# Patient Record
Sex: Female | Born: 1950 | Race: White | Hispanic: No | Marital: Married | Smoking: Current every day smoker
Health system: Southern US, Community
[De-identification: ages and names within clinical notes are randomized; demographics above are authoritative.]

## PROBLEM LIST (undated history)

## (undated) DIAGNOSIS — R0600 Dyspnea, unspecified: Secondary | ICD-10-CM

## (undated) DIAGNOSIS — E7800 Pure hypercholesterolemia, unspecified: Secondary | ICD-10-CM

## (undated) DIAGNOSIS — F419 Anxiety disorder, unspecified: Secondary | ICD-10-CM

## (undated) DIAGNOSIS — F32A Depression, unspecified: Secondary | ICD-10-CM

## (undated) DIAGNOSIS — I2510 Atherosclerotic heart disease of native coronary artery without angina pectoris: Secondary | ICD-10-CM

## (undated) DIAGNOSIS — I739 Peripheral vascular disease, unspecified: Secondary | ICD-10-CM

## (undated) DIAGNOSIS — F329 Major depressive disorder, single episode, unspecified: Secondary | ICD-10-CM

## (undated) DIAGNOSIS — I10 Essential (primary) hypertension: Secondary | ICD-10-CM

## (undated) HISTORY — PX: FOOT SURGERY: SHX648

## (undated) HISTORY — DX: Major depressive disorder, single episode, unspecified: F32.9

## (undated) HISTORY — PX: OTHER SURGICAL HISTORY: SHX169

## (undated) HISTORY — DX: Anxiety disorder, unspecified: F41.9

## (undated) HISTORY — PX: BUNIONECTOMY: SHX129

## (undated) HISTORY — DX: Depression, unspecified: F32.A

## (undated) HISTORY — PX: DENTAL SURGERY: SHX609

## (undated) HISTORY — PX: TUBAL LIGATION: SHX77

---

## 2004-09-18 ENCOUNTER — Ambulatory Visit: Payer: Self-pay | Admitting: Unknown Physician Specialty

## 2005-12-10 ENCOUNTER — Emergency Department: Payer: Self-pay | Admitting: General Practice

## 2007-05-04 ENCOUNTER — Ambulatory Visit: Payer: Self-pay | Admitting: Family Medicine

## 2007-11-16 ENCOUNTER — Ambulatory Visit: Payer: Self-pay | Admitting: Orthopaedic Surgery

## 2008-12-20 ENCOUNTER — Ambulatory Visit: Payer: Self-pay | Admitting: Family Medicine

## 2009-12-25 ENCOUNTER — Ambulatory Visit: Payer: Self-pay | Admitting: Internal Medicine

## 2010-03-06 ENCOUNTER — Ambulatory Visit: Payer: Self-pay | Admitting: Unknown Physician Specialty

## 2011-01-07 ENCOUNTER — Ambulatory Visit: Payer: Self-pay | Admitting: Family Medicine

## 2011-04-30 ENCOUNTER — Ambulatory Visit: Payer: Self-pay | Admitting: Unknown Physician Specialty

## 2011-05-02 LAB — PATHOLOGY REPORT

## 2011-12-02 ENCOUNTER — Telehealth: Payer: Self-pay | Admitting: *Deleted

## 2011-12-02 NOTE — Telephone Encounter (Signed)
Pharm faxed RF request - Prozac 40 mg 1 qd. OK for RF? (pt has apt on Friday)

## 2011-12-02 NOTE — Telephone Encounter (Signed)
Fine to fill. 

## 2011-12-03 MED ORDER — FLUOXETINE HCL 40 MG PO CAPS: 40.0000 mg | ORAL_CAPSULE | Freq: Every day | ORAL | Status: DC

## 2011-12-03 NOTE — Telephone Encounter (Signed)
Done

## 2011-12-05 ENCOUNTER — Encounter: Payer: Self-pay | Admitting: Internal Medicine

## 2011-12-05 ENCOUNTER — Ambulatory Visit: Payer: Self-pay | Admitting: Internal Medicine

## 2011-12-11 ENCOUNTER — Encounter: Payer: Self-pay | Admitting: Internal Medicine

## 2011-12-11 ENCOUNTER — Ambulatory Visit (INDEPENDENT_AMBULATORY_CARE_PROVIDER_SITE_OTHER): Payer: PRIVATE HEALTH INSURANCE | Admitting: Internal Medicine

## 2011-12-11 VITALS — BP 147/70 | HR 110 | Temp 98.4°F | Ht 64.5 in | Wt 135.0 lb

## 2011-12-11 DIAGNOSIS — N952 Postmenopausal atrophic vaginitis: Secondary | ICD-10-CM

## 2011-12-11 DIAGNOSIS — F411 Generalized anxiety disorder: Secondary | ICD-10-CM | POA: Insufficient documentation

## 2011-12-11 DIAGNOSIS — F341 Dysthymic disorder: Secondary | ICD-10-CM | POA: Insufficient documentation

## 2011-12-11 DIAGNOSIS — F418 Other specified anxiety disorders: Secondary | ICD-10-CM | POA: Insufficient documentation

## 2011-12-11 MED ORDER — ESTROGENS, CONJUGATED 0.625 MG/GM VA CREA: TOPICAL_CREAM | Freq: Every day | VAGINAL | Status: DC

## 2011-12-11 MED ORDER — ESTRADIOL 0.1 MG/GM VA CREA: 2.0000 g | TOPICAL_CREAM | Freq: Every day | VAGINAL | Status: DC

## 2011-12-11 MED ORDER — FLUOXETINE HCL 40 MG PO CAPS: 40.0000 mg | ORAL_CAPSULE | Freq: Every day | ORAL | Status: DC

## 2011-12-11 NOTE — Progress Notes (Signed)
Subjective:    [name], female    DOB: September 29, 1951, 61 y.o.   MRN: [id number]  HPI 61YO female with h/o depression and anxiety presents for follow up.  In regards to anxiety/depression, she reports symptoms fairly well controlled on Prozac.  She notes that she is "tired" of her job and of being the breadwinner in her family, however needs to work for another 5 years prior to retirement.  She is concerned today about some pain with intercourse. She notes that she only rarely has sexual relations with her husband. When they last had sex over in 09/2011, she had pain with intercourse.  She notes some vaginal dryness. She has tried using topical lubricants with no improvement in her symptoms.  She denies any vaginal bleeding or discharge. She denies pelvic pain.  Outpatient Encounter Prescriptions as of 12/11/2011  Medication Sig Dispense Refill  . cholecalciferol (VITAMIN D) 1000 UNITS tablet Take 1,000 Units by mouth daily.      Marland Kitchen FLUoxetine (PROZAC) 40 MG capsule Take 1 capsule (40 mg total) by mouth daily.  90 capsule  3  . Multiple Vitamins-Minerals (MULTIVITAMIN WITH MINERALS) tablet Take 1 tablet by mouth daily.        Review of Systems  Constitutional: Negative for fever, chills, appetite change, fatigue and unexpected weight change.  HENT: Negative for ear pain, congestion, sore throat, trouble swallowing, neck pain, voice change and sinus pressure.   Eyes: Negative for visual disturbance.  Respiratory: Negative for cough, shortness of breath, wheezing and stridor.   Cardiovascular: Negative for chest pain, palpitations and leg swelling.  Gastrointestinal: Negative for nausea, vomiting, abdominal pain, diarrhea, constipation, blood in stool, abdominal distention and anal bleeding.  Genitourinary: Positive for vaginal pain and dyspareunia. Negative for dysuria, flank pain, vaginal bleeding and vaginal discharge.  Musculoskeletal: Negative for myalgias, arthralgias and gait  problem.  Skin: Negative for color change and rash.  Neurological: Negative for dizziness and headaches.  Hematological: Negative for adenopathy. Does not bruise/bleed easily.  Psychiatric/Behavioral: Negative for suicidal ideas, sleep disturbance and dysphoric mood. The patient is nervous/anxious.    BP 147/70  Pulse 110  Temp(Src) 98.4 F (36.9 C) (Oral)  Ht 5' 4.5" (1.638 m)  Wt 135 lb (61.236 kg)  BMI 22.81 kg/m2  SpO2 99%     Objective:   Physical Exam  Constitutional: She is oriented to person, place, and time. She appears well-developed and well-nourished. No distress.  HENT:  Head: Normocephalic and atraumatic.  Right Ear: External ear normal.  Left Ear: External ear normal.  Nose: Nose normal.  Mouth/Throat: Oropharynx is clear and moist. No oropharyngeal exudate.  Eyes: Conjunctivae are normal. Pupils are equal, round, and reactive to light. Right eye exhibits no discharge. Left eye exhibits no discharge. No scleral icterus.  Neck: Normal range of motion. Neck supple. No tracheal deviation present. No thyromegaly present.  Cardiovascular: Normal rate, regular rhythm, normal heart sounds and intact distal pulses.  Exam reveals no gallop and no friction rub.   No murmur heard. Pulmonary/Chest: Effort normal and breath sounds normal. No respiratory distress. She has no wheezes. She has no rales. She exhibits no tenderness.  Musculoskeletal: Normal range of motion. She exhibits no edema and no tenderness.  Lymphadenopathy:    She has no cervical adenopathy.  Neurological: She is alert and oriented to person, place, and time. No cranial nerve deficit. She exhibits normal muscle tone. Coordination normal.  Skin: Skin is warm and dry. No rash noted. She  is not diaphoretic. No erythema. No pallor.  Psychiatric: Her speech is normal and behavior is normal. Judgment and thought content normal. Her mood appears anxious. Cognition and memory are normal.          Assessment &  Plan:

## 2011-12-11 NOTE — Assessment & Plan Note (Signed)
Will start topical estrace. Discussed using medication nightly x 2 weeks, then 1-2 times per week.  Discussed risks including potential increased risk of breast and uterine cancer.  Follow up 1 month.

## 2011-12-11 NOTE — Assessment & Plan Note (Signed)
Symptoms well controlled with Prozac. Will continue. Refills given today.

## 2011-12-19 ENCOUNTER — Encounter: Payer: Self-pay | Admitting: Internal Medicine

## 2012-01-01 ENCOUNTER — Telehealth: Payer: Self-pay | Admitting: Internal Medicine

## 2012-01-01 MED ORDER — ERGOCALCIFEROL 1.25 MG (50000 UT) PO CAPS: 50000.0000 [IU] | ORAL_CAPSULE | ORAL | Status: DC

## 2012-01-01 NOTE — Telephone Encounter (Signed)
Labs show low vit D of 19.9.  Recommend starting Vit D 50000IU weekly x12 weeks then repeat level. Cholesterol was also slightly high. Recommend diet low in saturated fat and high in fiber.

## 2012-01-01 NOTE — Telephone Encounter (Signed)
Patient informed, Rx sent in 

## 2012-01-06 ENCOUNTER — Encounter: Payer: Self-pay | Admitting: Internal Medicine

## 2012-01-08 ENCOUNTER — Ambulatory Visit: Payer: PRIVATE HEALTH INSURANCE | Admitting: Internal Medicine

## 2012-01-19 ENCOUNTER — Ambulatory Visit (INDEPENDENT_AMBULATORY_CARE_PROVIDER_SITE_OTHER): Payer: PRIVATE HEALTH INSURANCE | Admitting: Internal Medicine

## 2012-01-19 ENCOUNTER — Encounter: Payer: Self-pay | Admitting: Internal Medicine

## 2012-01-19 VITALS — BP 166/92 | HR 82 | Temp 98.6°F | Ht 64.5 in | Wt 136.2 lb

## 2012-01-19 DIAGNOSIS — R030 Elevated blood-pressure reading, without diagnosis of hypertension: Secondary | ICD-10-CM | POA: Insufficient documentation

## 2012-01-19 DIAGNOSIS — F418 Other specified anxiety disorders: Secondary | ICD-10-CM

## 2012-01-19 DIAGNOSIS — I10 Essential (primary) hypertension: Secondary | ICD-10-CM | POA: Insufficient documentation

## 2012-01-19 DIAGNOSIS — F341 Dysthymic disorder: Secondary | ICD-10-CM

## 2012-01-19 DIAGNOSIS — N952 Postmenopausal atrophic vaginitis: Secondary | ICD-10-CM

## 2012-01-19 MED ORDER — FLUOXETINE HCL 40 MG PO CAPS: 40.0000 mg | ORAL_CAPSULE | Freq: Every day | ORAL | Status: DC

## 2012-01-19 NOTE — Assessment & Plan Note (Signed)
Patient has decided not to use topical estrogen preparation. We'll continue to monitor.

## 2012-01-19 NOTE — Patient Instructions (Signed)
Please check blood pressure 2-3 times per week at home. Call with results.

## 2012-01-19 NOTE — Progress Notes (Signed)
Subjective:    [name], female    DOB: Aug 06, 1951, 61 y.o.   MRN: [id number]  HPI 61 year old female with history of anxiety and recent episode of atrophic vaginitis presents for followup. She reports that she ultimately decided not to use Estrace cream after long discussion with her husband in which they decided not to have intercourse. She feels that this will not be necessary at this time. She reports that she is comfortable with this decision.  In regards to her anxiety, she reports that symptoms are fairly well controlled with the use of Prozac.   Outpatient Encounter Prescriptions as of 01/19/2012  Medication Sig Dispense Refill  . ergocalciferol (DRISDOL) 50000 UNITS capsule Take 1 capsule (50,000 Units total) by mouth once a week.  12 capsule  0  . FLUoxetine (PROZAC) 40 MG capsule Take 1 capsule (40 mg total) by mouth daily.  90 capsule  4  . Multiple Vitamins-Minerals (MULTIVITAMIN WITH MINERALS) tablet Take 1 tablet by mouth daily.      Marland Kitchen DISCONTD: FLUoxetine (PROZAC) 40 MG capsule Take 1 capsule (40 mg total) by mouth daily.  90 capsule  3  . cholecalciferol (VITAMIN D) 1000 UNITS tablet Take 1,000 Units by mouth daily.      Marland Kitchen estradiol (ESTRACE VAGINAL) 0.1 MG/GM vaginal cream Place 0.25 Applicatorfuls vaginally daily.  42.5 g  12   BP 166/92  Pulse 82  Temp(Src) 98.6 F (37 C) (Oral)  Ht 5' 4.5" (1.638 m)  Wt 136 lb 4 oz (61.803 kg)  BMI 23.03 kg/m2  SpO2 95%  Review of Systems  Constitutional: Negative for fever, chills, appetite change, fatigue and unexpected weight change.  HENT: Negative for ear pain, congestion, sore throat, trouble swallowing, neck pain, voice change and sinus pressure.   Eyes: Negative for visual disturbance.  Respiratory: Negative for cough, shortness of breath, wheezing and stridor.   Cardiovascular: Negative for chest pain, palpitations and leg swelling.  Gastrointestinal: Negative for nausea, vomiting, abdominal pain, diarrhea,  constipation, blood in stool, abdominal distention and anal bleeding.  Genitourinary: Negative for dysuria and flank pain.  Musculoskeletal: Negative for myalgias, arthralgias and gait problem.  Skin: Negative for color change and rash.  Neurological: Negative for dizziness and headaches.  Hematological: Negative for adenopathy. Does not bruise/bleed easily.  Psychiatric/Behavioral: Negative for suicidal ideas, sleep disturbance and dysphoric mood. The patient is nervous/anxious.        Objective:   Physical Exam  Constitutional: She is oriented to person, place, and time. She appears well-developed and well-nourished. No distress.  HENT:  Head: Normocephalic and atraumatic.  Right Ear: External ear normal.  Left Ear: External ear normal.  Nose: Nose normal.  Mouth/Throat: Oropharynx is clear and moist. No oropharyngeal exudate.  Eyes: Conjunctivae are normal. Pupils are equal, round, and reactive to light. Right eye exhibits no discharge. Left eye exhibits no discharge. No scleral icterus.  Neck: Normal range of motion. Neck supple. No tracheal deviation present. No thyromegaly present.  Cardiovascular: Normal rate, regular rhythm, normal heart sounds and intact distal pulses.  Exam reveals no gallop and no friction rub.   No murmur heard. Pulmonary/Chest: Effort normal and breath sounds normal. No respiratory distress. She has no wheezes. She has no rales. She exhibits no tenderness.  Musculoskeletal: Normal range of motion. She exhibits no edema and no tenderness.  Lymphadenopathy:    She has no cervical adenopathy.  Neurological: She is alert and oriented to person, place, and time. No cranial nerve deficit.  She exhibits normal muscle tone. Coordination normal.  Skin: Skin is warm and dry. No rash noted. She is not diaphoretic. No erythema. No pallor.  Psychiatric: Her behavior is normal. Judgment and thought content normal. Her mood appears anxious. Her speech is rapid and/or  pressured.          Assessment & Plan:

## 2012-01-19 NOTE — Assessment & Plan Note (Addendum)
Likely secondary to anxiety. Patient will monitor blood pressure at home and will call with readings. BP has been transiently elevated in the past, but improved with reduced anxiety. Follow up prn.

## 2012-01-19 NOTE — Assessment & Plan Note (Signed)
Symptoms currently well-controlled with Prozac. We'll continue to monitor. Followup in 6 months.

## 2012-01-22 ENCOUNTER — Ambulatory Visit: Payer: Self-pay | Admitting: Family Medicine

## 2012-04-07 ENCOUNTER — Telehealth: Payer: Self-pay | Admitting: Internal Medicine

## 2012-04-07 NOTE — Telephone Encounter (Signed)
Patient advised as instructed via telephone. 

## 2012-04-07 NOTE — Telephone Encounter (Signed)
Labs were normal. Outside labs.

## 2012-07-22 ENCOUNTER — Encounter: Payer: Self-pay | Admitting: Internal Medicine

## 2012-07-22 ENCOUNTER — Ambulatory Visit (INDEPENDENT_AMBULATORY_CARE_PROVIDER_SITE_OTHER): Payer: PRIVATE HEALTH INSURANCE | Admitting: Internal Medicine

## 2012-07-22 VITALS — BP 144/92 | HR 85 | Temp 98.7°F | Ht 64.5 in | Wt 139.8 lb

## 2012-07-22 DIAGNOSIS — M25511 Pain in right shoulder: Secondary | ICD-10-CM | POA: Insufficient documentation

## 2012-07-22 DIAGNOSIS — H6691 Otitis media, unspecified, right ear: Secondary | ICD-10-CM

## 2012-07-22 DIAGNOSIS — F341 Dysthymic disorder: Secondary | ICD-10-CM

## 2012-07-22 DIAGNOSIS — M25519 Pain in unspecified shoulder: Secondary | ICD-10-CM

## 2012-07-22 DIAGNOSIS — R030 Elevated blood-pressure reading, without diagnosis of hypertension: Secondary | ICD-10-CM

## 2012-07-22 DIAGNOSIS — F418 Other specified anxiety disorders: Secondary | ICD-10-CM

## 2012-07-22 DIAGNOSIS — H6690 Otitis media, unspecified, unspecified ear: Secondary | ICD-10-CM

## 2012-07-22 MED ORDER — AMOXICILLIN-POT CLAVULANATE 875-125 MG PO TABS: 1.0000 | ORAL_TABLET | Freq: Two times a day (BID) | ORAL | Status: DC

## 2012-07-22 NOTE — Assessment & Plan Note (Signed)
Symptoms are controlled but persistently diffuse of Prozac. Patient has been contemplating referral for counseling. She will e-mail she is interested in proceeding with this.

## 2012-07-22 NOTE — Assessment & Plan Note (Signed)
Exam is most consistent with right otitis media. Will treat with Augmentin. Patient will call if symptoms are not improving.

## 2012-07-22 NOTE — Assessment & Plan Note (Signed)
Right shoulder pain and weakness with abduction is most consistent with rotator cuff tear. Recommended proceeding with imaging with MRI. Patient does not want to proceed with imaging at this time. She declines referral to orthopedics. Will continue to monitor.

## 2012-07-22 NOTE — Assessment & Plan Note (Signed)
Blood pressure slightly elevated today however patient reports better controlled at home. We'll continue to monitor. She will call if blood pressure consistently greater than 140/90.

## 2012-07-22 NOTE — Progress Notes (Signed)
Subjective:    [name], female    DOB: May 24, 1951, 61 y.o.   MRN: [id number]  HPI 61 year old female with history of anxiety/depression presents for followup. In regards to her anxiety and depression, she reports that symptoms have been well-controlled Prozac however she continues to have some exacerbation when dealing with ongoing family issues. She has been considering referral to psychologist for counseling, as we discussed at her last visit. She has not yet made a final decision on this.  She is concerned today about right ear pain. She notes that a couple of weeks ago she had some discomfort in her right ear and was cleaning her ear with a Q-tip. Since that time, the pain has been worse. She denies any nasal congestion, fever, chills. 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 Next  She is also concerned about several week history of right shoulder pain. She denies any trauma to her shoulder. She describes pain as aching which is made worse with movement. She also has some weakness in abduction of her right arm. She has not been taking any medication for pain.  Outpatient Encounter Prescriptions as of 07/22/2012  Medication Sig Dispense Refill  . cholecalciferol (VITAMIN D) 1000 UNITS tablet Take 1,000 Units by mouth daily.      Marland Kitchen FLUoxetine (PROZAC) 40 MG capsule Take 1 capsule (40 mg total) by mouth daily.  90 capsule  4  . Multiple Vitamins-Minerals (MULTIVITAMIN WITH MINERALS) tablet Take 1 tablet by mouth daily.      Marland Kitchen DISCONTD: ergocalciferol (DRISDOL) 50000 UNITS capsule Take 1 capsule (50,000 Units total) by mouth once a week.  12 capsule  0  . DISCONTD: estradiol (ESTRACE VAGINAL) 0.1 MG/GM vaginal cream Place 0.25 Applicatorfuls vaginally daily.  42.5 g  12  . amoxicillin-clavulanate (AUGMENTIN) 875-125 MG per tablet Take 1 tablet by mouth 2 (two) times daily.  20 tablet  0   BP 144/92  Pulse 85  Temp 98.7 F (37.1 C) (Oral)  Ht 5' 4.5" (1.638  m)  Wt 139 lb 12 oz (63.39 kg)  BMI 23.62 kg/m2  SpO2 99%  Review of Systems  Constitutional: Negative for fever, chills, appetite change, fatigue and unexpected weight change.  HENT: Positive for ear pain. Negative for congestion, sore throat, trouble swallowing, neck pain, voice change and sinus pressure.   Eyes: Negative for visual disturbance.  Respiratory: Negative for cough, shortness of breath, wheezing and stridor.   Cardiovascular: Negative for chest pain, palpitations and leg swelling.  Gastrointestinal: Negative for nausea, vomiting, abdominal pain, diarrhea, constipation, blood in stool, abdominal distention and anal bleeding.  Genitourinary: Negative for dysuria and flank pain.  Musculoskeletal: Positive for myalgias and arthralgias. Negative for gait problem.  Skin: Negative for color change and rash.  Neurological: Negative for dizziness and headaches.  Hematological: Negative for adenopathy. Does not bruise/bleed easily.  Psychiatric/Behavioral: Positive for dysphoric mood. Negative for suicidal ideas and disturbed wake/sleep cycle. The patient is nervous/anxious.        Objective:   Physical Exam  Constitutional: She is oriented to person, place, and time. She appears well-developed and well-nourished. No distress.  HENT:  Head: Normocephalic and atraumatic.  Right Ear: External ear normal.  Left Ear: External ear normal.  Nose: Nose normal.  Mouth/Throat: Oropharynx is clear and moist. No oropharyngeal exudate.  Eyes: Conjunctivae normal are normal. Pupils are equal, round, and reactive to light. Right eye exhibits no discharge. Left eye exhibits no discharge. No scleral  icterus.  Neck: Normal range of motion. Neck supple. No tracheal deviation present. No thyromegaly present.  Cardiovascular: Normal rate, regular rhythm, normal heart sounds and intact distal pulses.  Exam reveals no gallop and no friction rub.   No murmur heard. Pulmonary/Chest: Effort normal and  breath sounds normal. No respiratory distress. She has no wheezes. She has no rales. She exhibits no tenderness.  Musculoskeletal: She exhibits no edema and no tenderness.       Right shoulder: She exhibits decreased range of motion, tenderness, pain and decreased strength (decreased strength with abduction). She exhibits no bony tenderness and no crepitus.  Lymphadenopathy:    She has no cervical adenopathy.  Neurological: She is alert and oriented to person, place, and time. No cranial nerve deficit. She exhibits normal muscle tone. Coordination normal.  Skin: Skin is warm and dry. No rash noted. She is not diaphoretic. No erythema. No pallor.  Psychiatric: Her behavior is normal. Judgment and thought content normal. Her mood appears anxious.          Assessment & Plan:

## 2013-01-26 ENCOUNTER — Ambulatory Visit: Payer: Self-pay | Admitting: Family Medicine

## 2013-01-26 LAB — HM MAMMOGRAPHY: HM Mammogram: NORMAL

## 2013-02-02 ENCOUNTER — Ambulatory Visit (INDEPENDENT_AMBULATORY_CARE_PROVIDER_SITE_OTHER): Payer: BC Managed Care – PPO | Admitting: Internal Medicine

## 2013-02-02 ENCOUNTER — Encounter: Payer: Self-pay | Admitting: Internal Medicine

## 2013-02-02 VITALS — BP 160/86 | HR 74 | Temp 98.5°F | Wt 141.0 lb

## 2013-02-02 DIAGNOSIS — M67439 Ganglion, unspecified wrist: Secondary | ICD-10-CM | POA: Insufficient documentation

## 2013-02-02 DIAGNOSIS — M67431 Ganglion, right wrist: Secondary | ICD-10-CM

## 2013-02-02 DIAGNOSIS — L309 Dermatitis, unspecified: Secondary | ICD-10-CM | POA: Insufficient documentation

## 2013-02-02 DIAGNOSIS — F341 Dysthymic disorder: Secondary | ICD-10-CM

## 2013-02-02 DIAGNOSIS — R030 Elevated blood-pressure reading, without diagnosis of hypertension: Secondary | ICD-10-CM

## 2013-02-02 DIAGNOSIS — M6740 Ganglion, unspecified site: Secondary | ICD-10-CM

## 2013-02-02 DIAGNOSIS — F418 Other specified anxiety disorders: Secondary | ICD-10-CM

## 2013-02-02 DIAGNOSIS — IMO0001 Reserved for inherently not codable concepts without codable children: Secondary | ICD-10-CM

## 2013-02-02 DIAGNOSIS — L259 Unspecified contact dermatitis, unspecified cause: Secondary | ICD-10-CM

## 2013-02-02 NOTE — Assessment & Plan Note (Signed)
BP Readings from Last 3 Encounters:  02/02/13 160/86  07/22/12 144/92  01/19/12 166/92   BP elevated today, consistent with known h/o white coat hypertension, however well controlled at home. Will continue to monitor. Pt will call if consistently >140/90 at home.

## 2013-02-02 NOTE — Progress Notes (Signed)
Subjective:    [name], female    DOB: 09/24/1951, 62 y.o.   MRN: [id number]  HPI 62YO female with anxiety/depression presents for follow up. Generally doing well. Concerned today about several month h/o cyst over right medial wrist. Area is not painful. Has increased in size over last few months. No trauma to wrist. No overlying skin changes.  Also concerned with recent dry, itchy patches of skin over bilateral lower extremities. Symptoms come and go without obvious trigger. Not applying any specific lotions or creams.   Symptoms of anxiety generally well controlled with fluoxetine, however still continues to have occasional panic attacks which wake her from sleep at night. This has been chronic and ongoing for years.  Outpatient Encounter Prescriptions as of 02/02/2013  Medication Sig Dispense Refill  . cholecalciferol (VITAMIN D) 1000 UNITS tablet Take 1,000 Units by mouth daily.      Marland Kitchen FLUoxetine (PROZAC) 40 MG capsule Take 1 capsule (40 mg total) by mouth daily.  90 capsule  4  . Multiple Vitamins-Minerals (MULTIVITAMIN WITH MINERALS) tablet Take 1 tablet by mouth daily.       No facility-administered encounter medications on file as of 02/02/2013.   BP 160/86  Pulse 74  Temp(Src) 98.5 F (36.9 C) (Oral)  Wt 141 lb (63.957 kg)  BMI 23.84 kg/m2  SpO2 97%  Review of Systems  Constitutional: Negative for fever, chills, appetite change, fatigue and unexpected weight change.  HENT: Negative for ear pain, congestion, sore throat, trouble swallowing, neck pain, voice change and sinus pressure.   Eyes: Negative for visual disturbance.  Respiratory: Negative for cough, shortness of breath, wheezing and stridor.   Cardiovascular: Negative for chest pain, palpitations and leg swelling.  Gastrointestinal: Negative for nausea, vomiting, abdominal pain, diarrhea, constipation, blood in stool, abdominal distention and anal bleeding.  Genitourinary: Negative for dysuria and flank  pain.  Musculoskeletal: Negative for myalgias, arthralgias and gait problem.  Skin: Negative for color change and rash.  Neurological: Negative for dizziness and headaches.  Hematological: Negative for adenopathy. Does not bruise/bleed easily.  Psychiatric/Behavioral: Negative for suicidal ideas, sleep disturbance and dysphoric mood. The patient is nervous/anxious.        Objective:   Physical Exam  Constitutional: She is oriented to person, place, and time. She appears well-developed and well-nourished. No distress.  HENT:  Head: Normocephalic and atraumatic.  Right Ear: External ear normal.  Left Ear: External ear normal.  Nose: Nose normal.  Mouth/Throat: Oropharynx is clear and moist. No oropharyngeal exudate.  Eyes: Conjunctivae are normal. Pupils are equal, round, and reactive to light. Right eye exhibits no discharge. Left eye exhibits no discharge. No scleral icterus.  Neck: Normal range of motion. Neck supple. No tracheal deviation present. No thyromegaly present.  Cardiovascular: Normal rate, regular rhythm, normal heart sounds and intact distal pulses.  Exam reveals no gallop and no friction rub.   No murmur heard. Pulmonary/Chest: Effort normal and breath sounds normal. No accessory muscle usage. Not tachypneic. No respiratory distress. She has no decreased breath sounds. She has no wheezes. She has no rhonchi. She has no rales. She exhibits no tenderness.  Musculoskeletal: Normal range of motion. She exhibits no edema and no tenderness.  Lymphadenopathy:    She has no cervical adenopathy.  Neurological: She is alert and oriented to person, place, and time. No cranial nerve deficit. She exhibits normal muscle tone. Coordination normal.  Skin: Skin is warm and dry. No rash noted. She is not diaphoretic. No  erythema. No pallor.  Psychiatric: Her speech is normal and behavior is normal. Judgment and thought content normal. Her mood appears anxious. Cognition and memory are  normal.          Assessment & Plan:

## 2013-02-02 NOTE — Assessment & Plan Note (Signed)
Intermittent dry, pruritic skin bilateral LE. Encouraged use of moisturizer such as Eucerin. Will set up general dermatology evaluation.

## 2013-02-02 NOTE — Assessment & Plan Note (Signed)
Symptoms stable on current medication. Will continue.

## 2013-02-02 NOTE — Assessment & Plan Note (Signed)
Exam is consistent with ganglion cyst. Discussed referral to hand surgeon for excision. Pt would prefer to hold off for now.

## 2013-02-11 ENCOUNTER — Encounter: Payer: Self-pay | Admitting: Emergency Medicine

## 2013-03-10 ENCOUNTER — Other Ambulatory Visit: Payer: Self-pay | Admitting: Internal Medicine

## 2013-03-10 NOTE — Telephone Encounter (Signed)
Please Advise.....  Last refill 01/19/2012 #[id number] Last appointment 02/02/2013

## 2013-04-20 ENCOUNTER — Telehealth: Payer: Self-pay | Admitting: Internal Medicine

## 2013-04-20 NOTE — Telephone Encounter (Signed)
Labs including kidney and liver function are normal. Cholesterol is slightly elevated. Thyroid function was normal. Blood counts were normal. Vitamin D was normal. A1c was normal.

## 2013-05-16 ENCOUNTER — Ambulatory Visit (INDEPENDENT_AMBULATORY_CARE_PROVIDER_SITE_OTHER): Payer: BC Managed Care – PPO | Admitting: Internal Medicine

## 2013-05-16 ENCOUNTER — Encounter: Payer: Self-pay | Admitting: Internal Medicine

## 2013-05-16 VITALS — BP 180/86 | HR 84 | Temp 98.8°F | Ht 65.0 in | Wt 144.0 lb

## 2013-05-16 DIAGNOSIS — I10 Essential (primary) hypertension: Secondary | ICD-10-CM

## 2013-05-16 DIAGNOSIS — F341 Dysthymic disorder: Secondary | ICD-10-CM

## 2013-05-16 DIAGNOSIS — F418 Other specified anxiety disorders: Secondary | ICD-10-CM

## 2013-05-16 DIAGNOSIS — IMO0001 Reserved for inherently not codable concepts without codable children: Secondary | ICD-10-CM

## 2013-05-16 NOTE — Patient Instructions (Signed)
Email with blood pressure readings once a week.  [email or web address]

## 2013-05-16 NOTE — Progress Notes (Signed)
Subjective:    [name], female    DOB: 1951/07/12, 62 y.o.   MRN: [id number]  HPI 62 year old female with anxiety presents for followup. She reports she is generally feeling well. She feels that anxiety is generally well controlled with use of fluoxetine. No new concerns today. She does report that she checks her blood pressure at home and work and it is typically well-controlled less than 120/80. She notes a long history of elevated blood pressure when visiting a physician.  Outpatient Encounter Prescriptions as of 05/16/2013  Medication Sig Dispense Refill  . cholecalciferol (VITAMIN D) 1000 UNITS tablet Take 1,000 Units by mouth daily.      Marland Kitchen FLUoxetine (PROZAC) 40 MG capsule Take 1 capsule (40 mg total) by mouth daily.  90 capsule  4  . Multiple Vitamins-Minerals (MULTIVITAMIN WITH MINERALS) tablet Take 1 tablet by mouth daily.      . [DISCONTINUED] FLUoxetine (PROZAC) 40 MG capsule TAKE ONE CAPSULE BY MOUTH EVERY DAY  90 capsule  3   No facility-administered encounter medications on file as of 05/16/2013.   BP 180/86  Pulse 84  Temp(Src) 98.8 F (37.1 C) (Oral)  Ht 5\' 5"  (1.651 m)  Wt 144 lb (65.318 kg)  BMI 23.96 kg/m2  SpO2 99%  Review of Systems  Constitutional: Negative for fever, chills, appetite change, fatigue and unexpected weight change.  HENT: Negative for ear pain, congestion, sore throat, trouble swallowing, neck pain, voice change and sinus pressure.   Eyes: Negative for visual disturbance.  Respiratory: Negative for cough, shortness of breath, wheezing and stridor.   Cardiovascular: Negative for chest pain, palpitations and leg swelling.  Gastrointestinal: Negative for nausea, vomiting, abdominal pain, diarrhea, constipation, blood in stool, abdominal distention and anal bleeding.  Genitourinary: Negative for dysuria and flank pain.  Musculoskeletal: Negative for myalgias, arthralgias and gait problem.  Skin: Negative for color change and rash.   Neurological: Negative for dizziness and headaches.  Hematological: Negative for adenopathy. Does not bruise/bleed easily.  Psychiatric/Behavioral: Negative for suicidal ideas, sleep disturbance and dysphoric mood. The patient is nervous/anxious.        Objective:   Physical Exam  Constitutional: She is oriented to person, place, and time. She appears well-developed and well-nourished. No distress.  HENT:  Head: Normocephalic and atraumatic.  Right Ear: External ear normal.  Left Ear: External ear normal.  Nose: Nose normal.  Mouth/Throat: Oropharynx is clear and moist. No oropharyngeal exudate.  Eyes: Conjunctivae are normal. Pupils are equal, round, and reactive to light. Right eye exhibits no discharge. Left eye exhibits no discharge. No scleral icterus.  Neck: Normal range of motion. Neck supple. No tracheal deviation present. No thyromegaly present.  Cardiovascular: Normal rate, regular rhythm, normal heart sounds and intact distal pulses.  Exam reveals no gallop and no friction rub.   No murmur heard. Pulmonary/Chest: Effort normal and breath sounds normal. No accessory muscle usage. Not tachypneic. No respiratory distress. She has no decreased breath sounds. She has no wheezes. She has no rhonchi. She has no rales. She exhibits no tenderness.  Musculoskeletal: Normal range of motion. She exhibits no edema and no tenderness.  Lymphadenopathy:    She has no cervical adenopathy.  Neurological: She is alert and oriented to person, place, and time. No cranial nerve deficit. She exhibits normal muscle tone. Coordination normal.  Skin: Skin is warm and dry. No rash noted. She is not diaphoretic. No erythema. No pallor.  Psychiatric: Her behavior is normal. Judgment and thought content  normal. Her mood appears anxious.          Assessment & Plan:

## 2013-05-16 NOTE — Assessment & Plan Note (Signed)
Chronic anxiety generally well controlled with use of fluoxetine. Will continue.

## 2013-05-16 NOTE — Assessment & Plan Note (Signed)
BP Readings from Last 3 Encounters:  05/16/13 180/86  02/02/13 160/86  07/22/12 144/92   Blood pressure elevated during office visits however patient reports low at home and at work. Will have her record blood pressure and e-mail with readings. If blood pressure greater than 140/90 consistently at home, will add medication.

## 2013-08-18 ENCOUNTER — Other Ambulatory Visit: Payer: Self-pay

## 2013-11-10 ENCOUNTER — Ambulatory Visit: Payer: Self-pay | Admitting: Podiatry

## 2014-01-26 ENCOUNTER — Ambulatory Visit: Payer: Self-pay | Admitting: Family Medicine

## 2014-01-30 LAB — HM MAMMOGRAPHY: HM Mammogram: NORMAL

## 2014-05-23 ENCOUNTER — Encounter: Payer: BC Managed Care – PPO | Admitting: Internal Medicine

## 2014-06-01 ENCOUNTER — Encounter: Payer: Self-pay | Admitting: Internal Medicine

## 2014-06-01 ENCOUNTER — Ambulatory Visit (INDEPENDENT_AMBULATORY_CARE_PROVIDER_SITE_OTHER): Payer: BC Managed Care – PPO | Admitting: Internal Medicine

## 2014-06-01 VITALS — BP 138/88 | HR 94 | Temp 98.6°F | Ht 64.8 in | Wt 148.2 lb

## 2014-06-01 DIAGNOSIS — F341 Dysthymic disorder: Secondary | ICD-10-CM

## 2014-06-01 DIAGNOSIS — Z0000 Encounter for general adult medical examination without abnormal findings: Secondary | ICD-10-CM | POA: Insufficient documentation

## 2014-06-01 DIAGNOSIS — F418 Other specified anxiety disorders: Secondary | ICD-10-CM

## 2014-06-01 MED ORDER — FLUOXETINE HCL 40 MG PO CAPS: 40.0000 mg | ORAL_CAPSULE | Freq: Every day | ORAL | Status: DC

## 2014-06-01 NOTE — Progress Notes (Signed)
Pre visit review using our clinic review tool, if applicable. No additional management support is needed unless otherwise documented below in the visit note. 

## 2014-06-01 NOTE — Assessment & Plan Note (Signed)
General medical exam normal today. Pt declines breast exam. Recent mammogram normal. Pt declines PAP and pelvic exam. PAP in 2011 normal, HPV neg. Colonoscopy UTD. Requested records on this. Pt declines flu vaccine. Encouraged healthy diet and exercise. Encouraged smoking cessation. Discussed screening yearly chest CT for lung cancer, and she will look into coverage for this.

## 2014-06-01 NOTE — Progress Notes (Signed)
   Subjective:    [name], female    DOB: [birth year], 63 y.o.   MRN: [id number]  HPI 63YO female presents for annual exam. Feeling well. No concerns today. Continues to smoke. Not interested in quitting. Trying to follow healthy diet and exercise by walking.  Review of Systems  Constitutional: Negative for fever, chills, appetite change, fatigue and unexpected weight change.  Eyes: Negative for visual disturbance.  Respiratory: Negative for cough and shortness of breath.   Cardiovascular: Negative for chest pain and leg swelling.  Gastrointestinal: Negative for nausea, vomiting, abdominal pain, diarrhea and constipation.  Musculoskeletal: Negative for arthralgias and myalgias.  Skin: Negative for color change and rash.  Hematological: Negative for adenopathy. Does not bruise/bleed easily.  Psychiatric/Behavioral: Negative for sleep disturbance and dysphoric mood. The patient is not nervous/anxious.        Objective:    BP 138/88  Pulse 94  Temp(Src) 98.6 F (37 C) (Oral)  Ht 5' 4.8" (1.646 m)  Wt 148 lb 4 oz (67.246 kg)  BMI 24.82 kg/m2  SpO2 97% Physical Exam  Constitutional: She is oriented to person, place, and time. She appears well-developed and well-nourished. No distress.  HENT:  Head: Normocephalic and atraumatic.  Right Ear: External ear normal.  Left Ear: External ear normal.  Nose: Nose normal.  Mouth/Throat: Oropharynx is clear and moist. No oropharyngeal exudate.  Eyes: Conjunctivae and EOM are normal. Pupils are equal, round, and reactive to light. Right eye exhibits no discharge.  Neck: Normal range of motion. Neck supple. No thyromegaly present.  Cardiovascular: Normal rate, regular rhythm, normal heart sounds and intact distal pulses.  Exam reveals no gallop and no friction rub.   No murmur heard. Pulmonary/Chest: Effort normal. No respiratory distress. She has no wheezes. She has no rales.  Abdominal: Soft. Bowel sounds are normal. She  exhibits no distension and no mass. There is no tenderness. There is no rebound and no guarding.  Musculoskeletal: Normal range of motion. She exhibits no edema and no tenderness.  Lymphadenopathy:    She has no cervical adenopathy.  Neurological: She is alert and oriented to person, place, and time. No cranial nerve deficit. Coordination normal.  Skin: Skin is warm and dry. No rash noted. She is not diaphoretic. No erythema. No pallor.  Psychiatric: She has a normal mood and affect. Her behavior is normal. Judgment and thought content normal.          Assessment & Plan:   Problem List Items Addressed This Visit     Unprioritized   Depression with anxiety   Relevant Medications      FLUoxetine (PROZAC) 40 MG capsule   Routine general medical examination at a health care facility - Primary     General medical exam normal today. Pt declines breast exam. Recent mammogram normal. Pt declines PAP and pelvic exam. PAP in 2011 normal, HPV neg. Colonoscopy UTD. Requested records on this. Pt declines flu vaccine. Encouraged healthy diet and exercise. Encouraged smoking cessation. Discussed screening yearly chest CT for lung cancer, and she will look into coverage for this.        Return in about 6 months (around 12/02/2014) for Recheck.

## 2014-06-01 NOTE — Patient Instructions (Signed)

## 2015-01-30 ENCOUNTER — Encounter: Payer: Self-pay | Admitting: Family Medicine

## 2015-01-30 ENCOUNTER — Ambulatory Visit: Admit: 2015-01-30 | Disposition: A | Discharge status: Home / Self Care | Payer: Self-pay | Admitting: Family Medicine

## 2015-02-05 LAB — HM MAMMOGRAPHY

## 2015-04-19 LAB — CBC AND DIFFERENTIAL
HEMATOCRIT: 43 % (ref 36–46)
HEMOGLOBIN: 14.5 g/dL (ref 12.0–16.0)
Platelets: 331 10*3/uL (ref 150–399)
WBC: 8.3 10*3/mL

## 2015-04-19 LAB — BASIC METABOLIC PANEL
BUN: 14 mg/dL (ref 4–21)
Creatinine: 0.8 mg/dL (ref 0.5–1.1)
Glucose: 106 mg/dL
Potassium: 5.2 mmol/L (ref 3.4–5.3)
Sodium: 140 mmol/L (ref 137–147)

## 2015-04-19 LAB — LIPID PANEL
CHOLESTEROL: 303 mg/dL — AB (ref 0–200)
HDL: 93 mg/dL — AB (ref 35–70)
LDL CALC: 181 mg/dL
TRIGLYCERIDES: 147 mg/dL (ref 40–160)

## 2015-04-19 LAB — TSH: TSH: 1.62 u[IU]/mL (ref 0.41–5.90)

## 2015-04-19 LAB — HEMOGLOBIN A1C: Hgb A1c MFr Bld: 5.4 % (ref 4.0–6.0)

## 2015-04-19 LAB — HEPATIC FUNCTION PANEL
ALK PHOS: 93 U/L (ref 25–125)
ALT: 15 U/L (ref 7–35)
AST: 19 U/L (ref 13–35)

## 2015-06-05 ENCOUNTER — Encounter: Payer: BC Managed Care – PPO | Admitting: Internal Medicine

## 2015-06-20 ENCOUNTER — Ambulatory Visit (INDEPENDENT_AMBULATORY_CARE_PROVIDER_SITE_OTHER): Payer: BLUE CROSS/BLUE SHIELD | Admitting: Internal Medicine

## 2015-06-20 ENCOUNTER — Encounter: Payer: Self-pay | Admitting: Internal Medicine

## 2015-06-20 VITALS — BP 144/79 | HR 88 | Temp 98.3°F | Ht 64.25 in | Wt 147.0 lb

## 2015-06-20 DIAGNOSIS — Z0000 Encounter for general adult medical examination without abnormal findings: Secondary | ICD-10-CM

## 2015-06-20 DIAGNOSIS — E785 Hyperlipidemia, unspecified: Secondary | ICD-10-CM

## 2015-06-20 DIAGNOSIS — H9190 Unspecified hearing loss, unspecified ear: Secondary | ICD-10-CM

## 2015-06-20 LAB — HM PAP SMEAR

## 2015-06-20 MED ORDER — ATORVASTATIN CALCIUM 10 MG PO TABS: 10.0000 mg | ORAL_TABLET | Freq: Every day | ORAL | Status: DC

## 2015-06-20 NOTE — Patient Instructions (Addendum)
We will set up hearing testing. Start Atorvastatin 67m daily to help lower cholesterol. Repeat labs in 1 month.  Health Maintenance Adopting a healthy lifestyle and getting preventive care can go a long way to promote health and wellness. Talk with your health care provider about what schedule of regular examinations is right for you. This is a good chance for you to check in with your provider about disease prevention and staying healthy. In between checkups, there are plenty of things you can do on your own. Experts have done a lot of research about which lifestyle changes and preventive measures are most likely to keep you healthy. Ask your health care provider for more information. WEIGHT AND DIET  Eat a healthy diet  Be sure to include plenty of vegetables, fruits, low-fat dairy products, and lean protein.  Do not eat a lot of foods high in solid fats, added sugars, or salt.  Get regular exercise. This is one of the most important things you can do for your health.  Most adults should exercise for at least 150 minutes each week. The exercise should increase your heart rate and make you sweat (moderate-intensity exercise).  Most adults should also do strengthening exercises at least twice a week. This is in addition to the moderate-intensity exercise.  Maintain a healthy weight  Body mass index (BMI) is a measurement that can be used to identify possible weight problems. It estimates body fat based on height and weight. Your health care provider can help determine your BMI and help you achieve or maintain a healthy weight.  For females 64years of age and older:   A BMI below 18.5 is considered underweight.  A BMI of 18.5 to 24.9 is normal.  A BMI of 25 to 29.9 is considered overweight.  A BMI of 30 and above is considered obese.  Watch levels of cholesterol and blood lipids  You should start having your blood tested for lipids and cholesterol at 64years of age, then have  this test every 5 years.  You may need to have your cholesterol levels checked more often if:  Your lipid or cholesterol levels are high.  You are older than 64years of age.  You are at high risk for heart disease.  CANCER SCREENING   Lung Cancer  Lung cancer screening is recommended for adults 64years old who are at high risk for lung cancer because of a history of smoking.  A yearly low-dose CT scan of the lungs is recommended for people who:  Currently smoke.  Have quit within the past 15 years.  Have at least a 30-pack-year history of smoking. A pack year is smoking an average of one pack of cigarettes a day for 1 year.  Yearly screening should continue until it has been 15 years since you quit.  Yearly screening should stop if you develop a health problem that would prevent you from having lung cancer treatment.  Breast Cancer  Practice breast self-awareness. This means understanding how your breasts normally appear and feel.  It also means doing regular breast self-exams. Let your health care provider know about any changes, no matter how small.  If you are in your 20s or 30s, you should have a clinical breast exam (CBE) by a health care provider every 1-3 years as part of a regular health exam.  If you are 64or older, have a CBE every year. Also consider having a breast X-ray (mammogram) every year.  If you  have a family history of breast cancer, talk to your health care provider about genetic screening.  If you are at high risk for breast cancer, talk to your health care provider about having an MRI and a mammogram every year.  Breast cancer gene (BRCA) assessment is recommended for women who have family members with BRCA-related cancers. BRCA-related cancers include:  Breast.  Ovarian.  Tubal.  Peritoneal cancers.  Results of the assessment will determine the need for genetic counseling and BRCA1 and BRCA2 testing. Cervical Cancer Routine pelvic  examinations to screen for cervical cancer are no longer recommended for nonpregnant women who are considered low risk for cancer of the pelvic organs (ovaries, uterus, and vagina) and who do not have symptoms. A pelvic examination may be necessary if you have symptoms including those associated with pelvic infections. Ask your health care provider if a screening pelvic exam is right for you.   The Pap test is the screening test for cervical cancer for women who are considered at risk.  If you had a hysterectomy for a problem that was not cancer or a condition that could lead to cancer, then you no longer need Pap tests.  If you are older than 65 years, and you have had normal Pap tests for the past 10 years, you no longer need to have Pap tests.  If you have had past treatment for cervical cancer or a condition that could lead to cancer, you need Pap tests and screening for cancer for at least 20 years after your treatment.  If you no longer get a Pap test, assess your risk factors if they change (such as having a new sexual partner). This can affect whether you should start being screened again.  Some women have medical problems that increase their chance of getting cervical cancer. If this is the case for you, your health care provider may recommend more frequent screening and Pap tests.  The human papillomavirus (HPV) test is another test that may be used for cervical cancer screening. The HPV test looks for the virus that can cause cell changes in the cervix. The cells collected during the Pap test can be tested for HPV.  The HPV test can be used to screen women 64 years of age and older. Getting tested for HPV can extend the interval between normal Pap tests from three to five years.  An HPV test also should be used to screen women of any age who have unclear Pap test results.  After 64 years of age, women should have HPV testing as often as Pap tests.  Colorectal Cancer  This type of  cancer can be detected and often prevented.  Routine colorectal cancer screening usually begins at 64 years of age and continues through 64 years of age.  Your health care provider may recommend screening at an earlier age if you have risk factors for colon cancer.  Your health care provider may also recommend using home test kits to check for hidden blood in the stool.  A small camera at the end of a tube can be used to examine your colon directly (sigmoidoscopy or colonoscopy). This is done to check for the earliest forms of colorectal cancer.  Routine screening usually begins at age 8.  Direct examination of the colon should be repeated every 5-10 years through 64 years of age. However, you may need to be screened more often if early forms of precancerous polyps or small growths are found. Skin Cancer  Check  your skin from head to toe regularly.  Tell your health care provider about any new moles or changes in moles, especially if there is a change in a mole's shape or color.  Also tell your health care provider if you have a mole that is larger than the size of a pencil eraser.  Always use sunscreen. Apply sunscreen liberally and repeatedly throughout the day.  Protect yourself by wearing long sleeves, pants, a wide-brimmed hat, and sunglasses whenever you are outside. HEART DISEASE, DIABETES, AND HIGH BLOOD PRESSURE   Have your blood pressure checked at least every 1-2 years. High blood pressure causes heart disease and increases the risk of stroke.  If you are between 80 years and 22 years old, ask your health care provider if you should take aspirin to prevent strokes.  Have regular diabetes screenings. This involves taking a blood sample to check your fasting blood sugar level.  If you are at a normal weight and have a low risk for diabetes, have this test once every three years after 64 years of age.  If you are overweight and have a high risk for diabetes, consider being  tested at a younger age or more often. PREVENTING INFECTION  Hepatitis B  If you have a higher risk for hepatitis B, you should be screened for this virus. You are considered at high risk for hepatitis B if:  You were born in a country where hepatitis B is common. Ask your health care provider which countries are considered high risk.  Your parents were born in a high-risk country, and you have not been immunized against hepatitis B (hepatitis B vaccine).  You have HIV or AIDS.  You use needles to inject street drugs.  You live with someone who has hepatitis B.  You have had sex with someone who has hepatitis B.  You get hemodialysis treatment.  You take certain medicines for conditions, including cancer, organ transplantation, and autoimmune conditions. Hepatitis C  Blood testing is recommended for:  Everyone born from [birth year] through 1965.  Anyone with known risk factors for hepatitis C. Sexually transmitted infections (STIs)  You should be screened for sexually transmitted infections (STIs) including gonorrhea and chlamydia if:  You are sexually active and are younger than 64 years of age.  You are older than 64 years of age and your health care provider tells you that you are at risk for this type of infection.  Your sexual activity has changed since you were last screened and you are at an increased risk for chlamydia or gonorrhea. Ask your health care provider if you are at risk.  If you do not have HIV, but are at risk, it may be recommended that you take a prescription medicine daily to prevent HIV infection. This is called pre-exposure prophylaxis (PrEP). You are considered at risk if:  You are sexually active and do not regularly use condoms or know the HIV status of your partner(s).  You take drugs by injection.  You are sexually active with a partner who has HIV. Talk with your health care provider about whether you are at high risk of being infected with HIV. If  you choose to begin PrEP, you should first be tested for HIV. You should then be tested every 3 months for as long as you are taking PrEP.  PREGNANCY   If you are premenopausal and you may become pregnant, ask your health care provider about preconception counseling.  If you may become pregnant, take  400 to 800 micrograms (mcg) of folic acid every day.  If you want to prevent pregnancy, talk to your health care provider about birth control (contraception). OSTEOPOROSIS AND MENOPAUSE   Osteoporosis is a disease in which the bones lose minerals and strength with aging. This can result in serious bone fractures. Your risk for osteoporosis can be identified using a bone density scan.  If you are 88 years of age or older, or if you are at risk for osteoporosis and fractures, ask your health care provider if you should be screened.  Ask your health care provider whether you should take a calcium or vitamin D supplement to lower your risk for osteoporosis.  Menopause may have certain physical symptoms and risks.  Hormone replacement therapy may reduce some of these symptoms and risks. Talk to your health care provider about whether hormone replacement therapy is right for you.  HOME CARE INSTRUCTIONS   Schedule regular health, dental, and eye exams.  Stay current with your immunizations.   Do not use any tobacco products including cigarettes, chewing tobacco, or electronic cigarettes.  If you are pregnant, do not drink alcohol.  If you are breastfeeding, limit how much and how often you drink alcohol.  Limit alcohol intake to no more than 1 drink per day for nonpregnant women. One drink equals 12 ounces of beer, 5 ounces of wine, or 1 ounces of hard liquor.  Do not use street drugs.  Do not share needles.  Ask your health care provider for help if you need support or information about quitting drugs.  Tell your health care provider if you often feel depressed.  Tell your health  care provider if you have ever been abused or do not feel safe at home. Document Released: 04/14/2011 Document Revised: 02/13/2014 Document Reviewed: 08/31/2013 [facility] Patient Information 2015 [place], [place]. This information is not intended to replace advice given to you by your health care provider. Make sure you discuss any questions you have with your health care provider.

## 2015-06-20 NOTE — Progress Notes (Signed)
Subjective:    [name], female    DOB: 1951-07-02, 64 y.o.   MRN: [id number]  HPI  64YO female presents for annual exam.  Feeling well. Prefers not to have PAP today. Recent labs showed elevated cholesterol, LDL 181. Also concerned about hearing. Husband and coworkers have noted over last several years. No ear pain, congestion, fever.  Wt Readings from Last 3 Encounters:  06/20/15 147 lb (66.679 kg)  06/01/14 148 lb 4 oz (67.246 kg)  05/16/13 144 lb (65.318 kg)   BP Readings from Last 3 Encounters:  06/20/15 144/79  06/01/14 138/88  05/16/13 180/86      Past Medical History  Diagnosis Date  . Anxiety   . Depression    Family History  Problem Relation Age of Onset  . Hypertension Mother   . Heart disease Father   . Diabetes Father   . COPD Sister   . Heart disease Brother   . Crohn's disease Sister    Past Surgical History  Procedure Laterality Date  . Dental surgery    . Carpal tunnel repair    . Tubal ligation    . Vaginal delivery      x1   Social History   Social History  . Marital Status: Married    Spouse Name: N/A  . Number of Children: 1  . Years of Education: N/A   Occupational History  .     Social History Main Topics  . Smoking status: Current Every Day Smoker -- 1.00 packs/day for 35 years    Types: Cigarettes  . Smokeless tobacco: Never Used  . Alcohol Use: Yes     Comment: Daily glass wine  . Drug Use: No  . Sexual Activity: Not Asked   Other Topics Concern  . None   Social History Narrative    Review of Systems  Constitutional: Negative for fever, chills, appetite change, fatigue and unexpected weight change.  HENT: Positive for hearing loss. Negative for congestion, postnasal drip, rhinorrhea, sinus pressure, sneezing, sore throat, trouble swallowing and voice change.   Eyes: Negative for visual disturbance.  Respiratory: Negative for shortness of breath.   Cardiovascular: Negative for chest pain and leg  swelling.  Gastrointestinal: Negative for nausea, vomiting, abdominal pain, diarrhea and constipation.  Musculoskeletal: Negative for myalgias and arthralgias.  Skin: Negative for color change and rash.  Neurological: Negative for weakness.  Hematological: Negative for adenopathy. Does not bruise/bleed easily.  Psychiatric/Behavioral: Negative for sleep disturbance and dysphoric mood. The patient is nervous/anxious.        Objective:    BP 144/79 mmHg  Pulse 88  Temp(Src) 98.3 F (36.8 C) (Oral)  Ht 5' 4.25" (1.632 m)  Wt 147 lb (66.679 kg)  BMI 25.04 kg/m2  SpO2 98% Physical Exam  Constitutional: She is oriented to person, place, and time. She appears well-developed and well-nourished. No distress.  HENT:  Head: Normocephalic and atraumatic.  Right Ear: External ear normal.  Left Ear: External ear normal.  Nose: Nose normal.  Mouth/Throat: Oropharynx is clear and moist. No oropharyngeal exudate.  Eyes: Conjunctivae and EOM are normal. Pupils are equal, round, and reactive to light. Right eye exhibits no discharge.  Neck: Normal range of motion. Neck supple. No thyromegaly present.  Cardiovascular: Normal rate, regular rhythm, normal heart sounds and intact distal pulses.  Exam reveals no gallop and no friction rub.   No murmur heard. Pulmonary/Chest: Effort normal. No respiratory distress. She has no wheezes. She has  no rales.  Abdominal: Soft. Bowel sounds are normal. She exhibits no distension and no mass. There is no tenderness. There is no rebound and no guarding.  Musculoskeletal: Normal range of motion. She exhibits no edema or tenderness.  Lymphadenopathy:    She has no cervical adenopathy.  Neurological: She is alert and oriented to person, place, and time. She has normal strength. No cranial nerve deficit or sensory deficit. Coordination and gait normal.  Skin: Skin is warm and dry. No rash noted. She is not diaphoretic. No erythema. No pallor.  Psychiatric: Her  behavior is normal. Judgment and thought content normal. Her mood appears anxious. Her speech is rapid and/or pressured. Cognition and memory are normal.          Assessment & Plan:   Problem List Items Addressed This Visit      Unprioritized   Hearing loss    Bilateral hearing loss ongoing for years. Will set up hearing testing.      Relevant Orders   Ambulatory referral to Audiology   Hyperlipidemia    Lipids elevated with LDL 181. Will start Atorvastatin 10mg  daily. Recheck CMP and lipids in 1 month. Encouraged Mediterranean style diet and exercise.      Relevant Medications   atorvastatin (LIPITOR) 10 MG tablet   Routine general medical examination at a health care facility - Primary    General medical exam normal today. Pt declines both breast and pelvic exam. Colonoscopy UTD. Mammogram UTD. Flu vaccine and pneumovax declined. Labs reviewed. Encouraged healthy diet and exercise.          Return in about 6 months (around 12/18/2015) for Recheck.

## 2015-06-20 NOTE — Assessment & Plan Note (Signed)
Lipids elevated with LDL 181. Will start Atorvastatin 10mg  daily. Recheck CMP and lipids in 1 month. Encouraged Mediterranean style diet and exercise.

## 2015-06-20 NOTE — Assessment & Plan Note (Signed)
General medical exam normal today. Pt declines both breast and pelvic exam. Colonoscopy UTD. Mammogram UTD. Flu vaccine and pneumovax declined. Labs reviewed. Encouraged healthy diet and exercise.

## 2015-06-20 NOTE — Progress Notes (Signed)
Pre visit review using our clinic review tool, if applicable. No additional management support is needed unless otherwise documented below in the visit note. 

## 2015-06-20 NOTE — Assessment & Plan Note (Signed)
Bilateral hearing loss ongoing for years. Will set up hearing testing.

## 2015-07-23 LAB — LIPID PANEL
Cholesterol: 254 mg/dL — AB (ref 0–200)
HDL: 91 mg/dL — AB (ref 35–70)
LDL Cholesterol: 118 mg/dL
TRIGLYCERIDES: 226 mg/dL — AB (ref 40–160)

## 2015-07-23 LAB — HEPATIC FUNCTION PANEL
ALK PHOS: 94 U/L (ref 25–125)
ALT: 21 U/L (ref 7–35)
AST: 20 U/L (ref 13–35)
BILIRUBIN DIRECT: 0.11 mg/dL (ref 0.01–0.4)
BILIRUBIN, TOTAL: 0.4 mg/dL

## 2015-08-09 ENCOUNTER — Encounter: Payer: Self-pay | Admitting: Internal Medicine

## 2015-08-26 ENCOUNTER — Other Ambulatory Visit: Payer: Self-pay | Admitting: Internal Medicine

## 2015-12-18 ENCOUNTER — Ambulatory Visit (INDEPENDENT_AMBULATORY_CARE_PROVIDER_SITE_OTHER): Payer: BLUE CROSS/BLUE SHIELD | Admitting: Internal Medicine

## 2015-12-18 ENCOUNTER — Encounter: Payer: Self-pay | Admitting: Internal Medicine

## 2015-12-18 VITALS — BP 154/83 | HR 93 | Temp 98.2°F | Wt 149.0 lb

## 2015-12-18 DIAGNOSIS — K529 Noninfective gastroenteritis and colitis, unspecified: Secondary | ICD-10-CM | POA: Diagnosis not present

## 2015-12-18 DIAGNOSIS — R251 Tremor, unspecified: Secondary | ICD-10-CM | POA: Diagnosis not present

## 2015-12-18 DIAGNOSIS — E785 Hyperlipidemia, unspecified: Secondary | ICD-10-CM | POA: Diagnosis not present

## 2015-12-18 DIAGNOSIS — H9190 Unspecified hearing loss, unspecified ear: Secondary | ICD-10-CM | POA: Diagnosis not present

## 2015-12-18 NOTE — Assessment & Plan Note (Signed)
Chronic diarrhea. Diagnosed with "colitis" in the past by her report. Encouraged FODMAP diet. Encouraged her to follow up with GI.

## 2015-12-18 NOTE — Patient Instructions (Addendum)
Consider trying FODMAP diet.  Try using Vanicream for dry skin.

## 2015-12-18 NOTE — Assessment & Plan Note (Signed)
Bilateral hand tremor most c/w benign tremor. Discussed that caffeine will increase tremor. Encouraged her to limit caffeine intake. Discussed adding betablocker to help with symptoms.

## 2015-12-18 NOTE — Assessment & Plan Note (Signed)
Hearing loss. S/p bilateral hearing aids. Doing well.

## 2015-12-18 NOTE — Progress Notes (Signed)
Pre visit review using our clinic review tool, if applicable. No additional management support is needed unless otherwise documented below in the visit note. 

## 2015-12-18 NOTE — Progress Notes (Signed)
Subjective:    [name], female    DOB: 1951-04-30, 65 y.o.   MRN: [id number]  HPI  65YO female presents for follow up.  Chronic colitis - Seen in past by Dr. Tiffany Kocher. Discussed adding medication, unsure name. Continues to have watery stools daily. Limits time away from home because she is worried about soiling clothes. No abdominal pain. No blood in stool.  Tremor - Notes some shaking of her hands in the mornings after drinking 3 cups of coffee. Also worse when nervous. No gait changes. No other focal neurologic symptoms.  Hyperlipidemia - Recent lipids down 50pts on Atorvastatin.  Wt Readings from Last 3 Encounters:  12/18/15 149 lb (67.586 kg)  06/20/15 147 lb (66.679 kg)  06/01/14 148 lb 4 oz (67.246 kg)   BP Readings from Last 3 Encounters:  12/18/15 154/83  06/20/15 144/79  06/01/14 138/88    Past Medical History  Diagnosis Date  . Anxiety   . Depression    Family History  Problem Relation Age of Onset  . Hypertension Mother   . Heart disease Father   . Diabetes Father   . COPD Sister   . Heart disease Brother   . Crohn's disease Sister    Past Surgical History  Procedure Laterality Date  . Dental surgery    . Carpal tunnel repair    . Tubal ligation    . Vaginal delivery      x1   Social History   Social History  . Marital Status: Married    Spouse Name: N/A  . Number of Children: 1  . Years of Education: N/A   Occupational History  .     Social History Main Topics  . Smoking status: Current Every Day Smoker -- 1.00 packs/day for 35 years    Types: Cigarettes  . Smokeless tobacco: Never Used  . Alcohol Use: Yes     Comment: Daily glass wine  . Drug Use: No  . Sexual Activity: Not Asked   Other Topics Concern  . None   Social History Narrative    Review of Systems  Constitutional: Negative for fever, chills, appetite change, fatigue and unexpected weight change.  Eyes: Negative for visual disturbance.  Respiratory:  Negative for cough and shortness of breath.   Cardiovascular: Negative for chest pain and leg swelling.  Gastrointestinal: Negative for nausea, vomiting, abdominal pain, diarrhea and constipation.  Musculoskeletal: Negative for myalgias and arthralgias.  Skin: Negative for color change and rash.  Neurological: Positive for tremors. Negative for weakness.  Hematological: Negative for adenopathy. Does not bruise/bleed easily.  Psychiatric/Behavioral: Negative for suicidal ideas, sleep disturbance and dysphoric mood. The patient is not nervous/anxious.        Objective:    BP 154/83 mmHg  Pulse 93  Temp(Src) 98.2 F (36.8 C) (Oral)  Wt 149 lb (67.586 kg)  SpO2 97% Physical Exam  Constitutional: She is oriented to person, place, and time. She appears well-developed and well-nourished. No distress.  HENT:  Head: Normocephalic and atraumatic.  Right Ear: External ear normal.  Left Ear: External ear normal.  Nose: Nose normal.  Mouth/Throat: Oropharynx is clear and moist. No oropharyngeal exudate.  Eyes: Conjunctivae are normal. Pupils are equal, round, and reactive to light. Right eye exhibits no discharge. Left eye exhibits no discharge. No scleral icterus.  Neck: Normal range of motion. Neck supple. No tracheal deviation present. No thyromegaly present.  Cardiovascular: Normal rate, regular rhythm, normal heart sounds and intact  distal pulses.  Exam reveals no gallop and no friction rub.   No murmur heard. Pulmonary/Chest: Effort normal and breath sounds normal. No respiratory distress. She has no wheezes. She has no rales. She exhibits no tenderness.  Musculoskeletal: Normal range of motion. She exhibits no edema or tenderness.  Lymphadenopathy:    She has no cervical adenopathy.  Neurological: She is alert and oriented to person, place, and time. She displays tremor (fine tremor bilateral hands). No cranial nerve deficit or sensory deficit. She exhibits normal muscle tone.  Coordination normal.  Skin: Skin is warm and dry. No rash noted. She is not diaphoretic. No erythema. No pallor.  Psychiatric: She has a normal mood and affect. Her behavior is normal. Judgment and thought content normal.          Assessment & Plan:   Problem List Items Addressed This Visit      Unprioritized   Chronic diarrhea    Chronic diarrhea. Diagnosed with "colitis" in the past by her report. Encouraged FODMAP diet. Encouraged her to follow up with GI.       Hearing loss    Hearing loss. S/p bilateral hearing aids. Doing well.      Hyperlipidemia - Primary    Recent lipids improved 254 TC down from 300. Will continue Atorvastatin. Discussed potentially increasing dose but will hold for now.Follow up labs in 04/2016 through her work.      Tremor    Bilateral hand tremor most c/w benign tremor. Discussed that caffeine will increase tremor. Encouraged her to limit caffeine intake. Discussed adding betablocker to help with symptoms.          Return in about 6 months (around 06/19/2016) for Physical.  Ronette Deter, MD [facility]

## 2015-12-18 NOTE — Assessment & Plan Note (Signed)
Recent lipids improved 254 TC down from 300. Will continue Atorvastatin. Discussed potentially increasing dose but will hold for now.Follow up labs in 04/2016 through her work.

## 2016-02-27 ENCOUNTER — Telehealth: Payer: Self-pay | Admitting: Internal Medicine

## 2016-02-27 NOTE — Telephone Encounter (Signed)
Mammogram needs additional views

## 2016-05-22 DIAGNOSIS — K52831 Collagenous colitis: Secondary | ICD-10-CM | POA: Insufficient documentation

## 2016-06-17 ENCOUNTER — Other Ambulatory Visit: Payer: Self-pay

## 2016-06-17 DIAGNOSIS — E785 Hyperlipidemia, unspecified: Secondary | ICD-10-CM

## 2016-06-17 MED ORDER — ATORVASTATIN CALCIUM 10 MG PO TABS: 10.0000 mg | ORAL_TABLET | Freq: Every day | ORAL | 1 refills | Status: DC

## 2016-06-17 NOTE — Telephone Encounter (Signed)
Medication refill

## 2016-06-26 ENCOUNTER — Ambulatory Visit (INDEPENDENT_AMBULATORY_CARE_PROVIDER_SITE_OTHER): Payer: BLUE CROSS/BLUE SHIELD | Admitting: Family

## 2016-06-26 ENCOUNTER — Encounter: Payer: BLUE CROSS/BLUE SHIELD | Admitting: Internal Medicine

## 2016-06-26 ENCOUNTER — Other Ambulatory Visit: Payer: Self-pay | Admitting: Family

## 2016-06-26 ENCOUNTER — Encounter: Payer: Self-pay | Admitting: Family

## 2016-06-26 VITALS — BP 160/80 | HR 100 | Temp 98.4°F | Ht 64.0 in | Wt 147.6 lb

## 2016-06-26 DIAGNOSIS — IMO0001 Reserved for inherently not codable concepts without codable children: Secondary | ICD-10-CM

## 2016-06-26 DIAGNOSIS — E785 Hyperlipidemia, unspecified: Secondary | ICD-10-CM | POA: Diagnosis not present

## 2016-06-26 DIAGNOSIS — Z0000 Encounter for general adult medical examination without abnormal findings: Secondary | ICD-10-CM

## 2016-06-26 DIAGNOSIS — R030 Elevated blood-pressure reading, without diagnosis of hypertension: Secondary | ICD-10-CM | POA: Diagnosis not present

## 2016-06-26 LAB — COMPREHENSIVE METABOLIC PANEL
ALK PHOS: 95 U/L (ref 39–117)
ALT: 18 U/L (ref 0–35)
AST: 17 U/L (ref 0–37)
Albumin: 4 g/dL (ref 3.5–5.2)
BUN: 14 mg/dL (ref 6–23)
CO2: 30 mEq/L (ref 19–32)
Calcium: 9 mg/dL (ref 8.4–10.5)
Chloride: 102 mEq/L (ref 96–112)
Creatinine, Ser: 0.81 mg/dL (ref 0.40–1.20)
GFR: 75.38 mL/min (ref >60.00)
GLUCOSE: 93 mg/dL (ref 70–99)
POTASSIUM: 4.7 meq/L (ref 3.5–5.1)
SODIUM: 137 meq/L (ref 135–145)
TOTAL PROTEIN: 7.2 g/dL (ref 6.0–8.3)
Total Bilirubin: 0.4 mg/dL (ref 0.2–1.2)

## 2016-06-26 LAB — CBC WITH DIFFERENTIAL/PLATELET
Basophils Absolute: 0.1 10*3/uL (ref 0.0–0.1)
Basophils Relative: 0.5 % (ref 0.0–3.0)
EOS PCT: 1.4 % (ref 0.0–5.0)
Eosinophils Absolute: 0.1 10*3/uL (ref 0.0–0.7)
HCT: 41.4 % (ref 36.0–46.0)
Hemoglobin: 13.8 g/dL (ref 12.0–15.0)
LYMPHS ABS: 2.4 10*3/uL (ref 0.7–4.0)
Lymphocytes Relative: 24.7 % (ref 12.0–46.0)
MCHC: 33.4 g/dL (ref 30.0–36.0)
MCV: 92.3 fl (ref 78.0–100.0)
MONO ABS: 0.6 10*3/uL (ref 0.1–1.0)
Monocytes Relative: 6.6 % (ref 3.0–12.0)
NEUTROS ABS: 6.4 10*3/uL (ref 1.4–7.7)
NEUTROS PCT: 66.8 % (ref 43.0–77.0)
PLATELETS: 357 10*3/uL (ref 150.0–400.0)
RBC: 4.48 Mil/uL (ref 3.87–5.11)
RDW: 14.4 % (ref 11.5–15.5)
WBC: 9.6 10*3/uL (ref 4.0–10.5)

## 2016-06-26 LAB — LIPID PANEL
Cholesterol: 232 mg/dL — ABNORMAL HIGH (ref 0–200)
HDL: 86.2 mg/dL (ref >39.00)
LDL Cholesterol: 120 mg/dL — ABNORMAL HIGH (ref 0–99)
NONHDL: 145.43
Total CHOL/HDL Ratio: 3
Triglycerides: 128 mg/dL (ref 0.0–149.0)
VLDL: 25.6 mg/dL (ref 0.0–40.0)

## 2016-06-26 LAB — HEMOGLOBIN A1C: HEMOGLOBIN A1C: 5.6 % (ref 4.6–6.5)

## 2016-06-26 LAB — VITAMIN D 25 HYDROXY (VIT D DEFICIENCY, FRACTURES): VITD: 42.71 ng/mL (ref 30.00–100.00)

## 2016-06-26 LAB — TSH: TSH: 1.43 u[IU]/mL (ref 0.35–4.50)

## 2016-06-26 MED ORDER — ATORVASTATIN CALCIUM 40 MG PO TABS: 40.0000 mg | ORAL_TABLET | Freq: Every day | ORAL | 3 refills | Status: DC

## 2016-06-26 NOTE — Assessment & Plan Note (Addendum)
Patient is up-to-date on colonoscopy and mammogram. Patient declined clinical breast exam as she has just had a mammogram. Patient declines Pap smear due to age and preferences. She states no abnormal Pap or history of gynecologic cancer. She politely declines DEXA scan today. She agreed to do lung cancer screening with low-dose CT scan. Patient also politely declined tetanus booster or pneumococcal vaccine. I reemphasized the importance of pneumococcal vaccine as she is a smoker. Screening labs today. Patient declined hepatitis C or HIV screening. Encouraged smoking cessation.

## 2016-06-26 NOTE — Progress Notes (Signed)
Subjective:    [name], female    DOB: [birth year], 65 y.o.   MRN: [id number]  CC: Wanda Clayton is a 65 y.o. female who presents today for physical exam.    HPI: She here for physical. Feeling well, no complaints today.  HLD: Compliant with medication.   Depression: Stable, on prozac.Would love to get off of it however husband tells her he can tell a difference so she may stay on it for a while longer.     Colorectal Cancer Screening: UTD  Breast Cancer Screening: Mammogram UTD; right breast negative for malignancy, shows calcifications. Reviewed with patient.  Cervical Cancer Screening: Declines. No h/o abnormal Pap or GYN cancer.  Bone Health screening/DEXA for 65+: No increased fracture risk. Defer screening at this time. Had in the past and normal per patient. Lung Cancer Screening: Doesn't have 30 year pack year history and age > 48 years Candidate, agreed top do.   Immunizations       Tetanus - Had in the past 10 years, UTD per patient.         Pneumococcal - Candidate for. Declines Hepatitis C screening - Candidate for. Declines.  HIV Screening- Candidate for. Declines.  Labs: Screening labs today. Exercise: Gets regular exercise.  Alcohol use: Occasional.  Smoking/tobacco use: Smoker.  Regular dental exams: UTD.  Wears seat belt: Yes.  HISTORY:  Past Medical History:  Diagnosis Date  . Anxiety   . Depression     Past Surgical History:  Procedure Laterality Date  . carpal tunnel repair    . DENTAL SURGERY    . TUBAL LIGATION    . VAGINAL DELIVERY     x1   Family History  Problem Relation Age of Onset  . Hypertension Mother   . Heart disease Father   . Diabetes Father   . COPD Sister   . Heart disease Brother   . Crohn's disease Sister       ALLERGIES: Pollen extract  Current Outpatient Prescriptions on File Prior to Visit  Medication Sig Dispense Refill  . atorvastatin (LIPITOR) 10 MG tablet Take 1 tablet (10 mg total) by mouth  daily. 90 tablet 1  . cholecalciferol (VITAMIN D) 1000 UNITS tablet Take 1,000 Units by mouth daily.    Marland Kitchen FLUoxetine (PROZAC) 40 MG capsule TAKE 1 CAPSULE (40 MG TOTAL) BY MOUTH DAILY. 90 capsule 4  . Multiple Vitamins-Minerals (MULTIVITAMIN WITH MINERALS) tablet Take 1 tablet by mouth daily.     No current facility-administered medications on file prior to visit.     Social History  Substance Use Topics  . Smoking status: Current Every Day Smoker    Packs/day: 1.00    Years: 35.00    Types: Cigarettes  . Smokeless tobacco: Never Used  . Alcohol use Yes     Comment: Daily glass wine    Review of Systems  Constitutional: Negative for chills, fever and unexpected weight change.  HENT: Negative for congestion.   Respiratory: Negative for cough.   Cardiovascular: Negative for chest pain, palpitations and leg swelling.  Gastrointestinal: Negative for nausea and vomiting.  Musculoskeletal: Negative for arthralgias and myalgias.  Skin: Negative for rash.  Neurological: Negative for headaches.  Hematological: Negative for adenopathy.  Psychiatric/Behavioral: Negative for confusion.      Objective:    BP (!) 160/80   Pulse 100   Temp 98.4 F (36.9 C) (Oral)   Ht 5\' 4"  (1.626 m)   Wt 147 lb  9.6 oz (67 kg)   SpO2 95%   BMI 25.34 kg/m   BP Readings from Last 3 Encounters:  06/26/16 (!) 160/80  12/18/15 (!) 154/83  06/20/15 (!) 144/79   Wt Readings from Last 3 Encounters:  06/26/16 147 lb 9.6 oz (67 kg)  12/18/15 149 lb (67.6 kg)  06/20/15 147 lb (66.7 kg)    Physical Exam  Constitutional: She appears well-developed and well-nourished.  Eyes: Conjunctivae are normal.  Cardiovascular: Normal rate, regular rhythm, normal heart sounds and normal pulses.   Pulmonary/Chest: Effort normal and breath sounds normal. She has no wheezes. She has no rhonchi. She has no rales.  Neurological: She is alert.  Skin: Skin is warm and dry.  Psychiatric: She has a normal mood and  affect. Her speech is normal and behavior is normal. Thought content normal.  Vitals reviewed.      Assessment & Plan:   Problem List Items Addressed This Visit      Cardiovascular and Mediastinum   White coat hypertension    Blood pressures elevated today. Patient states "this always happens". I rechecked it myself. Advised patient to check blood pressure at home and she will call or email [place] with the measurement.        Other   Routine general medical examination at a health care facility    Patient is up-to-date on colonoscopy and mammogram. Patient declined clinical breast exam as she has just had a mammogram. Patient declines Pap smear due to age and preferences. She states no abnormal Pap or history of gynecologic cancer. She politely declines DEXA scan today. She agreed to do lung cancer screening with low-dose CT scan. Patient also politely declined tetanus booster or pneumococcal vaccine. I reemphasized the importance of pneumococcal vaccine as she is a smoker. Screening labs today. Patient declined hepatitis C or HIV screening. Encouraged smoking cessation.      Hyperlipidemia    Pending lipid panel.       Other Visit Diagnoses    Routine physical examination    -  Primary   Relevant Orders   CT CHEST LUNG CANCER SCREENING LOW DOSE WO CONTRAST   Comprehensive metabolic panel   CBC with Differential/Platelet   Hemoglobin A1c   Lipid panel   TSH   VITAMIN D 25 Hydroxy (Vit-D Deficiency, Fractures)       I am having Ms. Horn maintain her cholecalciferol, multivitamin with minerals, FLUoxetine, and atorvastatin.   No orders of the defined types were placed in this encounter.   Return precautions given.   Risks, benefits, and alternatives of the medications and treatment plan prescribed today were discussed, and patient expressed understanding.   Education regarding symptom management and diagnosis given to patient on AVS.   Continue to follow with [name], FNP for routine health maintenance.   Wanda Clayton and I agreed with plan.   [name], FNP

## 2016-06-26 NOTE — Assessment & Plan Note (Signed)
Pending lipid panel 

## 2016-06-26 NOTE — Progress Notes (Signed)
Pre visit review using our clinic review tool, if applicable. No additional management support is needed unless otherwise documented below in the visit note. 

## 2016-06-26 NOTE — Assessment & Plan Note (Signed)
Blood pressures elevated today. Patient states "this always happens". I rechecked it myself. Advised patient to check blood pressure at home and she will call or email [place] with the measurement.

## 2016-06-26 NOTE — Patient Instructions (Signed)
Health Maintenance, Female Adopting a healthy lifestyle and getting preventive care can go a long way to promote health and wellness. Talk with your health care provider about what schedule of regular examinations is right for you. This is a good chance for you to check in with your provider about disease prevention and staying healthy. In between checkups, there are plenty of things you can do on your own. Experts have done a lot of research about which lifestyle changes and preventive measures are most likely to keep you healthy. Ask your health care provider for more information. WEIGHT AND DIET  Eat a healthy diet  Be sure to include plenty of vegetables, fruits, low-fat dairy products, and lean protein.  Do not eat a lot of foods high in solid fats, added sugars, or salt.  Get regular exercise. This is one of the most important things you can do for your health.  Most adults should exercise for at least 150 minutes each week. The exercise should increase your heart rate and make you sweat (moderate-intensity exercise).  Most adults should also do strengthening exercises at least twice a week. This is in addition to the moderate-intensity exercise.  Maintain a healthy weight  Body mass index (BMI) is a measurement that can be used to identify possible weight problems. It estimates body fat based on height and weight. Your health care provider can help determine your BMI and help you achieve or maintain a healthy weight.  For females 20 years of age and older:   A BMI below 18.5 is considered underweight.  A BMI of 18.5 to 24.9 is normal.  A BMI of 25 to 29.9 is considered overweight.  A BMI of 30 and above is considered obese.  Watch levels of cholesterol and blood lipids  You should start having your blood tested for lipids and cholesterol at 65 years of age, then have this test every 5 years.  You may need to have your cholesterol levels checked more often if:  Your lipid  or cholesterol levels are high.  You are older than 65 years of age.  You are at high risk for heart disease.  CANCER SCREENING   Lung Cancer  Lung cancer screening is recommended for adults 55-80 years old who are at high risk for lung cancer because of a history of smoking.  A yearly low-dose CT scan of the lungs is recommended for people who:  Currently smoke.  Have quit within the past 15 years.  Have at least a 30-pack-year history of smoking. A pack year is smoking an average of one pack of cigarettes a day for 1 year.  Yearly screening should continue until it has been 15 years since you quit.  Yearly screening should stop if you develop a health problem that would prevent you from having lung cancer treatment.  Breast Cancer  Practice breast self-awareness. This means understanding how your breasts normally appear and feel.  It also means doing regular breast self-exams. Let your health care provider know about any changes, no matter how small.  If you are in your 20s or 30s, you should have a clinical breast exam (CBE) by a health care provider every 1-3 years as part of a regular health exam.  If you are 40 or older, have a CBE every year. Also consider having a breast X-ray (mammogram) every year.  If you have a family history of breast cancer, talk to your health care provider about genetic screening.  If you   are at high risk for breast cancer, talk to your health care provider about having an MRI and a mammogram every year.  Breast cancer gene (BRCA) assessment is recommended for women who have family members with BRCA-related cancers. BRCA-related cancers include:  Breast.  Ovarian.  Tubal.  Peritoneal cancers.  Results of the assessment will determine the need for genetic counseling and BRCA1 and BRCA2 testing. Cervical Cancer Your health care provider may recommend that you be screened regularly for cancer of the pelvic organs (ovaries, uterus, and  vagina). This screening involves a pelvic examination, including checking for microscopic changes to the surface of your cervix (Pap test). You may be encouraged to have this screening done every 3 years, beginning at age 21.  For women ages 30-65, health care providers may recommend pelvic exams and Pap testing every 3 years, or they may recommend the Pap and pelvic exam, combined with testing for human papilloma virus (HPV), every 5 years. Some types of HPV increase your risk of cervical cancer. Testing for HPV may also be done on women of any age with unclear Pap test results.  Other health care providers may not recommend any screening for nonpregnant women who are considered low risk for pelvic cancer and who do not have symptoms. Ask your health care provider if a screening pelvic exam is right for you.  If you have had past treatment for cervical cancer or a condition that could lead to cancer, you need Pap tests and screening for cancer for at least 20 years after your treatment. If Pap tests have been discontinued, your risk factors (such as having a new sexual partner) need to be reassessed to determine if screening should resume. Some women have medical problems that increase the chance of getting cervical cancer. In these cases, your health care provider may recommend more frequent screening and Pap tests. Colorectal Cancer  This type of cancer can be detected and often prevented.  Routine colorectal cancer screening usually begins at 65 years of age and continues through 65 years of age.  Your health care provider may recommend screening at an earlier age if you have risk factors for colon cancer.  Your health care provider may also recommend using home test kits to check for hidden blood in the stool.  A small camera at the end of a tube can be used to examine your colon directly (sigmoidoscopy or colonoscopy). This is done to check for the earliest forms of colorectal  cancer.  Routine screening usually begins at age 50.  Direct examination of the colon should be repeated every 5-10 years through 65 years of age. However, you may need to be screened more often if early forms of precancerous polyps or small growths are found. Skin Cancer  Check your skin from head to toe regularly.  Tell your health care provider about any new moles or changes in moles, especially if there is a change in a mole's shape or color.  Also tell your health care provider if you have a mole that is larger than the size of a pencil eraser.  Always use sunscreen. Apply sunscreen liberally and repeatedly throughout the day.  Protect yourself by wearing long sleeves, pants, a wide-brimmed hat, and sunglasses whenever you are outside. HEART DISEASE, DIABETES, AND HIGH BLOOD PRESSURE   High blood pressure causes heart disease and increases the risk of stroke. High blood pressure is more likely to develop in:  People who have blood pressure in the high end   of the normal range (130-139/85-89 mm Hg).  People who are overweight or obese.  People who are African American.  If you are 38-23 years of age, have your blood pressure checked every 3-5 years. If you are 61 years of age or older, have your blood pressure checked every year. You should have your blood pressure measured twice--once when you are at a hospital or clinic, and once when you are not at a hospital or clinic. Record the average of the two measurements. To check your blood pressure when you are not at a hospital or clinic, you can use:  An automated blood pressure machine at a pharmacy.  A home blood pressure monitor.  If you are between 45 years and 39 years old, ask your health care provider if you should take aspirin to prevent strokes.  Have regular diabetes screenings. This involves taking a blood sample to check your fasting blood sugar level.  If you are at a normal weight and have a low risk for diabetes,  have this test once every three years after 65 years of age.  If you are overweight and have a high risk for diabetes, consider being tested at a younger age or more often. PREVENTING INFECTION  Hepatitis B  If you have a higher risk for hepatitis B, you should be screened for this virus. You are considered at high risk for hepatitis B if:  You were born in a country where hepatitis B is common. Ask your health care provider which countries are considered high risk.  Your parents were born in a high-risk country, and you have not been immunized against hepatitis B (hepatitis B vaccine).  You have HIV or AIDS.  You use needles to inject street drugs.  You live with someone who has hepatitis B.  You have had sex with someone who has hepatitis B.  You get hemodialysis treatment.  You take certain medicines for conditions, including cancer, organ transplantation, and autoimmune conditions. Hepatitis C  Blood testing is recommended for:  Everyone born from [birth year] through 1965.  Anyone with known risk factors for hepatitis C. Sexually transmitted infections (STIs)  You should be screened for sexually transmitted infections (STIs) including gonorrhea and chlamydia if:  You are sexually active and are younger than 65 years of age.  You are older than 65 years of age and your health care provider tells you that you are at risk for this type of infection.  Your sexual activity has changed since you were last screened and you are at an increased risk for chlamydia or gonorrhea. Ask your health care provider if you are at risk.  If you do not have HIV, but are at risk, it may be recommended that you take a prescription medicine daily to prevent HIV infection. This is called pre-exposure prophylaxis (PrEP). You are considered at risk if:  You are sexually active and do not regularly use condoms or know the HIV status of your partner(s).  You take drugs by injection.  You are sexually  active with a partner who has HIV. Talk with your health care provider about whether you are at high risk of being infected with HIV. If you choose to begin PrEP, you should first be tested for HIV. You should then be tested every 3 months for as long as you are taking PrEP.  PREGNANCY   If you are premenopausal and you may become pregnant, ask your health care provider about preconception counseling.  If you may  become pregnant, take 400 to 800 micrograms (mcg) of folic acid every day.  If you want to prevent pregnancy, talk to your health care provider about birth control (contraception). OSTEOPOROSIS AND MENOPAUSE   Osteoporosis is a disease in which the bones lose minerals and strength with aging. This can result in serious bone fractures. Your risk for osteoporosis can be identified using a bone density scan.  If you are 61 years of age or older, or if you are at risk for osteoporosis and fractures, ask your health care provider if you should be screened.  Ask your health care provider whether you should take a calcium or vitamin D supplement to lower your risk for osteoporosis.  Menopause may have certain physical symptoms and risks.  Hormone replacement therapy may reduce some of these symptoms and risks. Talk to your health care provider about whether hormone replacement therapy is right for you.  HOME CARE INSTRUCTIONS   Schedule regular health, dental, and eye exams.  Stay current with your immunizations.   Do not use any tobacco products including cigarettes, chewing tobacco, or electronic cigarettes.  If you are pregnant, do not drink alcohol.  If you are breastfeeding, limit how much and how often you drink alcohol.  Limit alcohol intake to no more than 1 drink per day for nonpregnant women. One drink equals 12 ounces of beer, 5 ounces of wine, or 1 ounces of hard liquor.  Do not use street drugs.  Do not share needles.  Ask your health care provider for help if  you need support or information about quitting drugs.  Tell your health care provider if you often feel depressed.  Tell your health care provider if you have ever been abused or do not feel safe at home.   This information is not intended to replace advice given to you by your health care provider. Make sure you discuss any questions you have with your health care provider.   Document Released: 04/14/2011 Document Revised: 10/20/2014 Document Reviewed: 08/31/2013 Elsevier Interactive Patient Education [employer].

## 2016-06-27 NOTE — Progress Notes (Signed)
Left message with patients husband to return call back.

## 2016-07-01 ENCOUNTER — Telehealth: Payer: Self-pay | Admitting: *Deleted

## 2016-07-01 NOTE — Telephone Encounter (Signed)
Received referral for low dose lung cancer screening CT scan. Voicemail left at phone number listed in EMR for patient to call me back to facilitate scheduling scan.  

## 2016-07-02 ENCOUNTER — Telehealth: Payer: Self-pay | Admitting: *Deleted

## 2016-07-02 NOTE — Telephone Encounter (Signed)
Patient returned call regarding: Received referral for initial lung cancer screening scan. Contacted patient and obtained smoking history,(current, 35 pack year) as well as answering questions related to screening process. Patient denies signs of lung cancer such as weight loss or hemoptysis. Patient denies comorbidity that would prevent curative treatment if lung cancer were found. Patient is tentatively scheduled for shared decision making visit and CT scan on 07/15/16 at 1:30pm, pending insurance approval from business office.

## 2016-07-04 ENCOUNTER — Encounter: Payer: Self-pay | Admitting: Family

## 2016-07-04 NOTE — Progress Notes (Signed)
Patient stated her bp is being up and normal every couple days.  She stated that she will put her numbers in a mychart message.Marland Kitchen

## 2016-07-14 ENCOUNTER — Other Ambulatory Visit: Payer: Self-pay | Admitting: *Deleted

## 2016-07-14 DIAGNOSIS — Z87891 Personal history of nicotine dependence: Secondary | ICD-10-CM

## 2016-07-15 ENCOUNTER — Inpatient Hospital Stay: Payer: BLUE CROSS/BLUE SHIELD | Attending: Oncology | Admitting: Oncology

## 2016-07-15 ENCOUNTER — Ambulatory Visit
Admission: RE | Admit: 2016-07-15 | Discharge: 2016-07-15 | Disposition: A | Discharge status: Home / Self Care | Payer: BLUE CROSS/BLUE SHIELD | Source: Ambulatory Visit | Attending: Oncology | Admitting: Oncology

## 2016-07-15 DIAGNOSIS — J219 Acute bronchiolitis, unspecified: Secondary | ICD-10-CM | POA: Diagnosis not present

## 2016-07-15 DIAGNOSIS — Z87891 Personal history of nicotine dependence: Secondary | ICD-10-CM

## 2016-07-15 DIAGNOSIS — I2510 Atherosclerotic heart disease of native coronary artery without angina pectoris: Secondary | ICD-10-CM | POA: Diagnosis not present

## 2016-07-15 DIAGNOSIS — I700 Atherosclerosis of aorta: Secondary | ICD-10-CM | POA: Diagnosis not present

## 2016-07-15 DIAGNOSIS — F17210 Nicotine dependence, cigarettes, uncomplicated: Secondary | ICD-10-CM | POA: Insufficient documentation

## 2016-07-15 DIAGNOSIS — Z122 Encounter for screening for malignant neoplasm of respiratory organs: Secondary | ICD-10-CM | POA: Insufficient documentation

## 2016-07-15 DIAGNOSIS — J432 Centrilobular emphysema: Secondary | ICD-10-CM | POA: Diagnosis not present

## 2016-07-15 NOTE — Progress Notes (Signed)
In accordance with CMS guidelines, patient has met eligibility criteria including age, absence of signs or symptoms of lung cancer.  Social History  Substance Use Topics  . Smoking status: Current Every Day Smoker    Packs/day: 1.00    Years: 35.00    Types: Cigarettes  . Smokeless tobacco: Never Used  . Alcohol use Yes     Comment: Daily glass wine     A shared decision-making session was conducted prior to the performance of CT scan. This includes one or more decision aids, includes benefits and harms of screening, follow-up diagnostic testing, over-diagnosis, false positive rate, and total radiation exposure.  Counseling on the importance of adherence to annual lung cancer LDCT screening, impact of co-morbidities, and ability or willingness to undergo diagnosis and treatment is imperative for compliance of the program.  Counseling on the importance of continued smoking cessation for former smokers; the importance of smoking cessation for current smokers, and information about tobacco cessation interventions have been given to patient including [place] and [facility] quit  programs.  Written order for lung cancer screening with LDCT has been given to the patient and any and all questions have been answered to the best of my abilities.   Yearly follow up will be coordinated by Burgess Estelle, Thoracic Navigator.

## 2016-07-17 ENCOUNTER — Telehealth: Payer: Self-pay | Admitting: *Deleted

## 2016-07-17 NOTE — Telephone Encounter (Signed)
Patient is already being treated with cholesterol medication  Patient is a current every day smoker. Please call patient- if she is any cough, chest congestion, please advise her that office visit.

## 2016-07-17 NOTE — Telephone Encounter (Signed)
Patient has been informed. She is not having any SX.

## 2016-07-17 NOTE — Telephone Encounter (Signed)
Notified patient of LDCT lung cancer screening results with recommendation for 12 month follow up imaging. Also notified of incidental finding noted below. Patient is encouraged to discuss incidental findings with PCP who will receive a copy of this note via Epic. Patient verbalizes understanding.   IMPRESSION: 1. Lung-RADS Category 2S, benign appearance or behavior. Continue annual screening with low-dose chest CT without contrast in 12 months. 2. The "S" modifier above refers to potentially clinically significant non lung cancer related findings. Specifically, left main and two-vessel coronary atherosclerosis. 3. Mild centrilobular emphysema and mild diffuse bronchial wall thickening, suggesting COPD. 4. Mild patchy centrilobular ground-glass opacities in the upper lobes, most consistent with respiratory bronchiolitis in an asymptomatic current smoker. 5. Aortic atherosclerosis.

## 2016-07-23 ENCOUNTER — Encounter: Payer: Self-pay | Admitting: Family

## 2016-11-19 ENCOUNTER — Other Ambulatory Visit: Payer: Self-pay

## 2016-11-19 MED ORDER — FLUOXETINE HCL 40 MG PO CAPS: ORAL_CAPSULE | ORAL | 4 refills | Status: DC

## 2016-11-19 NOTE — Telephone Encounter (Signed)
Medication has been refilled.

## 2017-06-25 ENCOUNTER — Other Ambulatory Visit: Payer: Self-pay | Admitting: Family

## 2017-06-25 DIAGNOSIS — E785 Hyperlipidemia, unspecified: Secondary | ICD-10-CM

## 2017-06-29 ENCOUNTER — Encounter: Payer: Self-pay | Admitting: Family

## 2017-06-29 ENCOUNTER — Ambulatory Visit (INDEPENDENT_AMBULATORY_CARE_PROVIDER_SITE_OTHER): Payer: PPO | Admitting: Family

## 2017-06-29 VITALS — BP 142/88 | HR 96 | Temp 98.6°F | Ht 65.0 in | Wt 150.6 lb

## 2017-06-29 DIAGNOSIS — F418 Other specified anxiety disorders: Secondary | ICD-10-CM | POA: Diagnosis not present

## 2017-06-29 DIAGNOSIS — Z0000 Encounter for general adult medical examination without abnormal findings: Secondary | ICD-10-CM

## 2017-06-29 DIAGNOSIS — Z122 Encounter for screening for malignant neoplasm of respiratory organs: Secondary | ICD-10-CM

## 2017-06-29 DIAGNOSIS — Z0001 Encounter for general adult medical examination with abnormal findings: Secondary | ICD-10-CM

## 2017-06-29 DIAGNOSIS — R030 Elevated blood-pressure reading, without diagnosis of hypertension: Secondary | ICD-10-CM | POA: Diagnosis not present

## 2017-06-29 LAB — LIPID PANEL
CHOL/HDL RATIO: 3
Cholesterol: 293 mg/dL — ABNORMAL HIGH (ref 0–200)
HDL: 89.2 mg/dL (ref >39.00)
NONHDL: 203.84
TRIGLYCERIDES: 361 mg/dL — AB (ref 0.0–149.0)
VLDL: 72.2 mg/dL — ABNORMAL HIGH (ref 0.0–40.0)

## 2017-06-29 LAB — CBC WITH DIFFERENTIAL/PLATELET
BASOS PCT: 0.6 % (ref 0.0–3.0)
Basophils Absolute: 0.1 10*3/uL (ref 0.0–0.1)
EOS PCT: 0.9 % (ref 0.0–5.0)
Eosinophils Absolute: 0.1 10*3/uL (ref 0.0–0.7)
HCT: 42.8 % (ref 36.0–46.0)
HEMOGLOBIN: 14.5 g/dL (ref 12.0–15.0)
LYMPHS ABS: 2.7 10*3/uL (ref 0.7–4.0)
Lymphocytes Relative: 30.1 % (ref 12.0–46.0)
MCHC: 33.9 g/dL (ref 30.0–36.0)
MCV: 94.4 fl (ref 78.0–100.0)
MONO ABS: 0.6 10*3/uL (ref 0.1–1.0)
Monocytes Relative: 6.8 % (ref 3.0–12.0)
NEUTROS PCT: 61.6 % (ref 43.0–77.0)
Neutro Abs: 5.5 10*3/uL (ref 1.4–7.7)
Platelets: 342 10*3/uL (ref 150.0–400.0)
RBC: 4.53 Mil/uL (ref 3.87–5.11)
RDW: 13.9 % (ref 11.5–15.5)
WBC: 9 10*3/uL (ref 4.0–10.5)

## 2017-06-29 LAB — COMPREHENSIVE METABOLIC PANEL
ALBUMIN: 4.2 g/dL (ref 3.5–5.2)
ALK PHOS: 94 U/L (ref 39–117)
ALT: 18 U/L (ref 0–35)
AST: 16 U/L (ref 0–37)
BUN: 12 mg/dL (ref 6–23)
CHLORIDE: 102 meq/L (ref 96–112)
CO2: 26 mEq/L (ref 19–32)
Calcium: 9.5 mg/dL (ref 8.4–10.5)
Creatinine, Ser: 0.83 mg/dL (ref 0.40–1.20)
GFR: 73.06 mL/min (ref >60.00)
Glucose, Bld: 106 mg/dL — ABNORMAL HIGH (ref 70–99)
POTASSIUM: 4 meq/L (ref 3.5–5.1)
SODIUM: 137 meq/L (ref 135–145)
Total Bilirubin: 0.5 mg/dL (ref 0.2–1.2)
Total Protein: 7.4 g/dL (ref 6.0–8.3)

## 2017-06-29 LAB — VITAMIN D 25 HYDROXY (VIT D DEFICIENCY, FRACTURES): VITD: 47.55 ng/mL (ref 30.00–100.00)

## 2017-06-29 LAB — HEPATITIS C ANTIBODY
Hepatitis C Ab: NONREACTIVE
SIGNAL TO CUT-OFF: 0.01 (ref <1.00)

## 2017-06-29 LAB — HEMOGLOBIN A1C: HEMOGLOBIN A1C: 5.5 % (ref 4.6–6.5)

## 2017-06-29 LAB — TSH: TSH: 2.15 u[IU]/mL (ref 0.35–4.50)

## 2017-06-29 LAB — LDL CHOLESTEROL, DIRECT: LDL DIRECT: 77 mg/dL

## 2017-06-29 NOTE — Patient Instructions (Addendum)
Please call [facility] and CONFIRM results of colonoscopy and when you should repeat.   Please let us know when last Tdap ( tetanus ) was from [facility] blood pressure,  Goal is less than 130/80; if persistently higher, please make sooner follow up appointment so we can recheck you blood pressure   We placed a referral for mammogram this year. I asked that you call one the below locations and schedule this when it is convenient for you.   As discussed, I would like you to ask for 3D mammogram over the traditional 2D mammogram as new evidence suggest 3D is superior.   Please note that NOT all insurance companies cover 3D and you may have to pay a higher copay. You may call your insurance company to further clarify your benefits.   Options for Mammogram.    [facility]  [address]  [place], [place]  [phone]  * [facility] [address] [place], [place] [phone] * Note if you ask for 3D mammogram at this location, you must request [place], [place] location*     Health Maintenance for Postmenopausal Women Menopause is a normal process in which your reproductive ability comes to an end. This process happens gradually over a span of months to years, usually between the ages of 77 and 50. Menopause is complete when you have missed 12 consecutive menstrual periods. It is important to talk with your health care provider about some of the most common conditions that affect postmenopausal women, such as heart disease, cancer, and bone loss (osteoporosis). Adopting a healthy lifestyle and getting preventive care can help to promote your health and wellness. Those actions can also lower your chances of developing some of these common conditions. What should I know about menopause? During menopause, you may experience a number of symptoms, such as:  Moderate-to-severe hot flashes.  Night  sweats.  Decrease in sex drive.  Mood swings.  Headaches.  Tiredness.  Irritability.  Memory problems.  Insomnia.  Choosing to treat or not to treat menopausal changes is an individual decision that you make with your health care provider. What should I know about hormone replacement therapy and supplements? Hormone therapy products are effective for treating symptoms that are associated with menopause, such as hot flashes and night sweats. Hormone replacement carries certain risks, especially as you become older. If you are thinking about using estrogen or estrogen with progestin treatments, discuss the benefits and risks with your health care provider. What should I know about heart disease and stroke? Heart disease, heart attack, and stroke become more likely as you age. This may be due, in part, to the hormonal changes that your body experiences during menopause. These can affect how your body processes dietary fats, triglycerides, and cholesterol. Heart attack and stroke are both medical emergencies. There are many things that you can do to help prevent heart disease and stroke:  Have your blood pressure checked at least every 1-2 years. High blood pressure causes heart disease and increases the risk of stroke.  If you are 52-69 years old, ask your health care provider if you should take aspirin to prevent a heart attack or a stroke.  Do not use any tobacco products, including cigarettes, chewing tobacco, or electronic cigarettes. If you need help quitting, ask your health care provider.  It is important to eat a healthy diet and maintain a healthy weight. ? Be sure  to include plenty of vegetables, fruits, low-fat dairy products, and lean protein. ? Avoid eating foods that are high in solid fats, added sugars, or salt (sodium).  Get regular exercise. This is one of the most important things that you can do for your health. ? Try to exercise for at least 150 minutes each week.  The type of exercise that you do should increase your heart rate and make you sweat. This is known as moderate-intensity exercise. ? Try to do strengthening exercises at least twice each week. Do these in addition to the moderate-intensity exercise.  Know your numbers.Ask your health care provider to check your cholesterol and your blood glucose. Continue to have your blood tested as directed by your health care provider.  What should I know about cancer screening? There are several types of cancer. Take the following steps to reduce your risk and to catch any cancer development as early as possible. Breast Cancer  Practice breast self-awareness. ? This means understanding how your breasts normally appear and feel. ? It also means doing regular breast self-exams. Let your health care provider know about any changes, no matter how small.  If you are 31 or older, have a clinician do a breast exam (clinical breast exam or CBE) every year. Depending on your age, family history, and medical history, it may be recommended that you also have a yearly breast X-ray (mammogram).  If you have a family history of breast cancer, talk with your health care provider about genetic screening.  If you are at high risk for breast cancer, talk with your health care provider about having an MRI and a mammogram every year.  Breast cancer (BRCA) gene test is recommended for women who have family members with BRCA-related cancers. Results of the assessment will determine the need for genetic counseling and BRCA1 and for BRCA2 testing. BRCA-related cancers include these types: ? Breast. This occurs in males or females. ? Ovarian. ? Tubal. This may also be called fallopian tube cancer. ? Cancer of the abdominal or pelvic lining (peritoneal cancer). ? Prostate. ? Pancreatic.  Cervical, Uterine, and Ovarian Cancer Your health care provider may recommend that you be screened regularly for cancer of the pelvic  organs. These include your ovaries, uterus, and vagina. This screening involves a pelvic exam, which includes checking for microscopic changes to the surface of your cervix (Pap test).  For women ages 21-65, health care providers may recommend a pelvic exam and a Pap test every three years. For women ages 67-65, they may recommend the Pap test and pelvic exam, combined with testing for human papilloma virus (HPV), every five years. Some types of HPV increase your risk of cervical cancer. Testing for HPV may also be done on women of any age who have unclear Pap test results.  Other health care providers may not recommend any screening for nonpregnant women who are considered low risk for pelvic cancer and have no symptoms. Ask your health care provider if a screening pelvic exam is right for you.  If you have had past treatment for cervical cancer or a condition that could lead to cancer, you need Pap tests and screening for cancer for at least 20 years after your treatment. If Pap tests have been discontinued for you, your risk factors (such as having a new sexual partner) need to be reassessed to determine if you should start having screenings again. Some women have medical problems that increase the chance of getting cervical cancer. In these  cases, your health care provider may recommend that you have screening and Pap tests more often.  If you have a family history of uterine cancer or ovarian cancer, talk with your health care provider about genetic screening.  If you have vaginal bleeding after reaching menopause, tell your health care provider.  There are currently no reliable tests available to screen for ovarian cancer.  Lung Cancer Lung cancer screening is recommended for adults 46-30 years old who are at high risk for lung cancer because of a history of smoking. A yearly low-dose CT scan of the lungs is recommended if you:  Currently smoke.  Have a history of at least 30 pack-years of  smoking and you currently smoke or have quit within the past 15 years. A pack-year is smoking an average of one pack of cigarettes per day for one year.  Yearly screening should:  Continue until it has been 15 years since you quit.  Stop if you develop a health problem that would prevent you from having lung cancer treatment.  Colorectal Cancer  This type of cancer can be detected and can often be prevented.  Routine colorectal cancer screening usually begins at age 46 and continues through age 24.  If you have risk factors for colon cancer, your health care provider may recommend that you be screened at an earlier age.  If you have a family history of colorectal cancer, talk with your health care provider about genetic screening.  Your health care provider may also recommend using home test kits to check for hidden blood in your stool.  A small camera at the end of a tube can be used to examine your colon directly (sigmoidoscopy or colonoscopy). This is done to check for the earliest forms of colorectal cancer.  Direct examination of the colon should be repeated every 5-10 years until age 68. However, if early forms of precancerous polyps or small growths are found or if you have a family history or genetic risk for colorectal cancer, you may need to be screened more often.  Skin Cancer  Check your skin from head to toe regularly.  Monitor any moles. Be sure to tell your health care provider: ? About any new moles or changes in moles, especially if there is a change in a mole's shape or color. ? If you have a mole that is larger than the size of a pencil eraser.  If any of your family members has a history of skin cancer, especially at a young age, talk with your health care provider about genetic screening.  Always use sunscreen. Apply sunscreen liberally and repeatedly throughout the day.  Whenever you are outside, protect yourself by wearing long sleeves, pants, a wide-brimmed  hat, and sunglasses.  What should I know about osteoporosis? Osteoporosis is a condition in which bone destruction happens more quickly than new bone creation. After menopause, you may be at an increased risk for osteoporosis. To help prevent osteoporosis or the bone fractures that can happen because of osteoporosis, the following is recommended:  If you are 58-12 years old, get at least 1,000 mg of calcium and at least 600 mg of vitamin D per day.  If you are older than age 18 but younger than age 57, get at least 1,200 mg of calcium and at least 600 mg of vitamin D per day.  If you are older than age 22, get at least 1,200 mg of calcium and at least 800 mg of vitamin D per  day.  Smoking and excessive alcohol intake increase the risk of osteoporosis. Eat foods that are rich in calcium and vitamin D, and do weight-bearing exercises several times each week as directed by your health care provider. What should I know about how menopause affects my mental health? Depression may occur at any age, but it is more common as you become older. Common symptoms of depression include:  Low or sad mood.  Changes in sleep patterns.  Changes in appetite or eating patterns.  Feeling an overall lack of motivation or enjoyment of activities that you previously enjoyed.  Frequent crying spells.  Talk with your health care provider if you think that you are experiencing depression. What should I know about immunizations? It is important that you get and maintain your immunizations. These include:  Tetanus, diphtheria, and pertussis (Tdap) booster vaccine.  Influenza every year before the flu season begins.  Pneumonia vaccine.  Shingles vaccine.  Your health care provider may also recommend other immunizations. This information is not intended to replace advice given to you by your health care provider. Make sure you discuss any questions you have with your health care provider. Document Released:  11/21/2005 Document Revised: 04/18/2016 Document Reviewed: 07/03/2015 Elsevier Interactive Patient Education  2018 [employer]  

## 2017-06-29 NOTE — Assessment & Plan Note (Addendum)
Unable to see report for colonoscopy, and strongly emphasized the importance of patient of contacting. Dr. Edd Fabian office and confirming when colonoscopy is due. There is some discrepancy in her chart. Also do not have reports his colonoscopy and requested patient to get those as well. Patient due for mammogram, she will schedule. She is also due for CT low dose chest which has also been ordered. Screening labs ordered. Patient declines any vaccines including influenza, pneumococcal, shingles. She also declines Pap smear today. I advised her that her last Pap was 7 years ago, and I would at least do a Pap at this visit and we can determine if she would continue Paps going forward. Patient  declines at this time and will let me know if she does her mind. Clinical breast exam performed. Encouraged exercise.

## 2017-06-29 NOTE — Assessment & Plan Note (Signed)
Mildly elevated today. Patient smoked prior to coming in to office visit which I suspect has contributed. Patient will monitor blood pressure at home and let us know persistently elevated, thus the need to start medications.

## 2017-06-29 NOTE — Progress Notes (Signed)
Subjective:    [name], female    DOB: 06/19/1951, 66 y.o.   MRN: [id number]  CC: Wanda Clayton is a 66 y.o. female who presents today for physical exam.    HPI: Elevated blood pressure- states always happens in doctor's office. Had smoke a cigarette prior to coming in for physical. Denies exertional chest pain or pressure, numbness or tingling radiating to left arm or jaw, palpitations, dizziness, frequent headaches, changes in vision, or shortness of breath.   Depression-doing well  On prozac. No thoughts of hurting herself or anyone else.       Colorectal Cancer Screening: Dr Vira Agar ,2013;  again at 2017 with Dr Tiffany Kocher Rob Hickman however unsure when due. Thought due 4-5 years. Had polyps, colitis per patient,  Breast Cancer Screening: Mammogram due Cervical Cancer Screening: Due; declines; last pap 7 years ago.  Bone Health screening/DEXA for 65+:Due Lung Cancer Screening: Doesn't have 30 year pack year history and age > 46 years.       Tetanus - due        Pneumococcal - Candidate for, declines Hepatitis C screening - Candidate for, consents Labs: Screening labs today. Exercise: No regular exercise.  Alcohol use: daily glass of wine Smoking/tobacco use: smoker.  Regular dental exams: UTD Wears seat belt: Yes. Skin: No h/o skin cancer; declines referral for annual skin check  HISTORY:  Past Medical History:  Diagnosis Date  . Anxiety   . Depression     Past Surgical History:  Procedure Laterality Date  . carpal tunnel repair    . DENTAL SURGERY    . TUBAL LIGATION    . VAGINAL DELIVERY     x1   Family History  Problem Relation Age of Onset  . Hypertension Mother   . Heart disease Father   . Diabetes Father   . COPD Sister   . Heart disease Brother   . Crohn's disease Sister       ALLERGIES: Pollen extract  Current Outpatient Prescriptions on File Prior to Visit  Medication Sig Dispense Refill  . atorvastatin (LIPITOR) 40 MG tablet  TAKE 1 TABLET (40 MG TOTAL) BY MOUTH DAILY. 30 tablet 0  . cholecalciferol (VITAMIN D) 1000 UNITS tablet Take 1,000 Units by mouth daily.    Marland Kitchen FLUoxetine (PROZAC) 40 MG capsule TAKE 1 CAPSULE (40 MG TOTAL) BY MOUTH DAILY. 90 capsule 4  . Multiple Vitamins-Minerals (MULTIVITAMIN WITH MINERALS) tablet Take 1 tablet by mouth daily.     No current facility-administered medications on file prior to visit.     Social History  Substance Use Topics  . Smoking status: Current Every Day Smoker    Packs/day: 1.00    Years: 35.00    Types: Cigarettes  . Smokeless tobacco: Never Used  . Alcohol use Yes     Comment: Daily glass wine    Review of Systems  Constitutional: Negative for chills, fever and unexpected weight change.  HENT: Negative for congestion.   Respiratory: Negative for cough.   Cardiovascular: Negative for chest pain, palpitations and leg swelling.  Gastrointestinal: Negative for blood in stool, nausea and vomiting.  Genitourinary: Negative for dysuria, vaginal bleeding and vaginal discharge.  Musculoskeletal: Negative for arthralgias and myalgias.  Skin: Negative for rash.  Neurological: Negative for headaches.  Hematological: Negative for adenopathy.  Psychiatric/Behavioral: Negative for confusion and suicidal ideas.      Objective:    BP (!) 142/88   Pulse 96   Temp 98.6  F (37 C) (Oral)   Ht 5\' 5"  (1.651 m)   Wt 150 lb 9.6 oz (68.3 kg)   SpO2 98%   BMI 25.06 kg/m   BP Readings from Last 3 Encounters:  06/29/17 (!) 142/88  06/26/16 (!) 160/80  12/18/15 (!) 154/83   Wt Readings from Last 3 Encounters:  06/29/17 150 lb 9.6 oz (68.3 kg)  07/15/16 147 lb (66.7 kg)  06/26/16 147 lb 9.6 oz (67 kg)    Physical Exam  Constitutional: She appears well-developed and well-nourished.  Eyes: Conjunctivae are normal.  Neck: No thyroid mass and no thyromegaly present.  Cardiovascular: Normal rate, regular rhythm, normal heart sounds and normal pulses.     Pulmonary/Chest: Effort normal and breath sounds normal. She has no wheezes. She has no rhonchi. She has no rales. Right breast exhibits no inverted nipple, no mass, no nipple discharge, no skin change and no tenderness. Left breast exhibits no inverted nipple, no mass, no nipple discharge, no skin change and no tenderness. Breasts are symmetrical.  CBE performed.   Lymphadenopathy:       Head (right side): No submental, no submandibular, no tonsillar, no preauricular, no posterior auricular and no occipital adenopathy present.       Head (left side): No submental, no submandibular, no tonsillar, no preauricular, no posterior auricular and no occipital adenopathy present.    She has no cervical adenopathy.       Right cervical: No superficial cervical, no deep cervical and no posterior cervical adenopathy present.      Left cervical: No superficial cervical, no deep cervical and no posterior cervical adenopathy present.    She has no axillary adenopathy.  Neurological: She is alert.  Skin: Skin is warm and dry.  Psychiatric: She has a normal mood and affect. Her speech is normal and behavior is normal. Thought content normal.  Vitals reviewed.      Assessment & Plan:   Problem List Items Addressed This Visit      Other   Depression with anxiety    Stable. Continue current regimen.      Elevated blood pressure reading    Mildly elevated today. Patient smoked prior to coming in to office visit which I suspect has contributed. Patient will monitor blood pressure at home and let us know persistently elevated, thus the need to start medications.      Routine general medical examination at a health care facility - Primary    Unable to see report for colonoscopy, and strongly emphasized the importance of patient of contacting. Dr. Edd Fabian office and confirming when colonoscopy is due. There is some discrepancy in her chart. Also do not have reports his colonoscopy and requested patient to  get those as well. Patient due for mammogram, she will schedule. She is also due for CT low dose chest which has also been ordered. Screening labs ordered. Patient declines any vaccines including influenza, pneumococcal, shingles. She also declines Pap smear today. I advised her that her last Pap was 7 years ago, and I would at least do a Pap at this visit and we can determine if she would continue Paps going forward. Patient  declines at this time and will let me know if she does her mind. Clinical breast exam performed. Encouraged exercise.      Relevant Orders   CBC with Differential/Platelet   Comprehensive metabolic panel   Hemoglobin A1c   Hepatitis C antibody   Lipid panel   VITAMIN D 25 Hydroxy (  Vit-D Deficiency, Fractures)   TSH   MM SCREENING BREAST TOMO BILATERAL   DG Bone Density   CT CHEST LUNG CA SCREEN LOW DOSE W/O CM    Other Visit Diagnoses    Encounter for screening for malignant neoplasm of respiratory organs       Relevant Orders   CT CHEST LUNG CA SCREEN LOW DOSE W/O CM       I am having Ms. Sheldon maintain her cholecalciferol, multivitamin with minerals, FLUoxetine, and atorvastatin.   No orders of the defined types were placed in this encounter.   Return precautions given.   Risks, benefits, and alternatives of the medications and treatment plan prescribed today were discussed, and patient expressed understanding.   Education regarding symptom management and diagnosis given to patient on AVS.   Continue to follow with Burnard Hawthorne, FNP for routine health maintenance.   Wanda Clayton and I agreed with plan.   [name], FNP

## 2017-06-29 NOTE — Assessment & Plan Note (Signed)
Stable  Continue current regimen  

## 2017-06-30 ENCOUNTER — Telehealth: Payer: Self-pay | Admitting: *Deleted

## 2017-06-30 ENCOUNTER — Other Ambulatory Visit: Payer: Self-pay | Admitting: Family

## 2017-06-30 ENCOUNTER — Encounter: Payer: Self-pay | Admitting: Family

## 2017-06-30 DIAGNOSIS — E785 Hyperlipidemia, unspecified: Secondary | ICD-10-CM

## 2017-06-30 DIAGNOSIS — Z87891 Personal history of nicotine dependence: Secondary | ICD-10-CM

## 2017-06-30 DIAGNOSIS — Z122 Encounter for screening for malignant neoplasm of respiratory organs: Secondary | ICD-10-CM

## 2017-06-30 MED ORDER — ATORVASTATIN CALCIUM 40 MG PO TABS: 80.0000 mg | ORAL_TABLET | Freq: Every day | ORAL | 4 refills | Status: DC

## 2017-06-30 NOTE — Telephone Encounter (Signed)
Notified patient that annual lung cancer screening low dose CT scan is due currently or will be in near future. Confirmed that patient is within the age range of 55-77, and asymptomatic, (no signs or symptoms of lung cancer). Patient denies illness that would prevent curative treatment for lung cancer if found. Verified smoking history, (current, 36 pack year). The shared decision making visit was done 07/15/16. Patient is agreeable for CT scan being scheduled.

## 2017-07-01 DIAGNOSIS — H25813 Combined forms of age-related cataract, bilateral: Secondary | ICD-10-CM | POA: Diagnosis not present

## 2017-07-13 ENCOUNTER — Telehealth: Payer: Self-pay | Admitting: Family

## 2017-07-13 NOTE — Telephone Encounter (Signed)
Chart has been updated.

## 2017-07-13 NOTE — Telephone Encounter (Signed)
Call pt  She never responded to mychart  When does she repeat her colonoscopy?  Also, when was last tetanus vaccine?   Please update her record

## 2017-07-16 ENCOUNTER — Ambulatory Visit
Admission: RE | Admit: 2017-07-16 | Discharge: 2017-07-16 | Disposition: A | Discharge status: Home / Self Care | Payer: PPO | Source: Ambulatory Visit | Attending: Oncology | Admitting: Oncology

## 2017-07-16 DIAGNOSIS — J439 Emphysema, unspecified: Secondary | ICD-10-CM | POA: Diagnosis not present

## 2017-07-16 DIAGNOSIS — Z87891 Personal history of nicotine dependence: Secondary | ICD-10-CM | POA: Insufficient documentation

## 2017-07-16 DIAGNOSIS — F17210 Nicotine dependence, cigarettes, uncomplicated: Secondary | ICD-10-CM | POA: Diagnosis not present

## 2017-07-16 DIAGNOSIS — I700 Atherosclerosis of aorta: Secondary | ICD-10-CM | POA: Diagnosis not present

## 2017-07-16 DIAGNOSIS — Z122 Encounter for screening for malignant neoplasm of respiratory organs: Secondary | ICD-10-CM | POA: Insufficient documentation

## 2017-07-20 ENCOUNTER — Encounter: Payer: Self-pay | Admitting: *Deleted

## 2017-07-24 ENCOUNTER — Other Ambulatory Visit: Payer: Self-pay | Admitting: Family

## 2017-07-24 DIAGNOSIS — E785 Hyperlipidemia, unspecified: Secondary | ICD-10-CM

## 2017-07-30 ENCOUNTER — Ambulatory Visit
Admission: RE | Admit: 2017-07-30 | Discharge: 2017-07-30 | Disposition: A | Discharge status: Home / Self Care | Payer: PPO | Source: Ambulatory Visit | Attending: Family | Admitting: Family

## 2017-07-30 DIAGNOSIS — Z0000 Encounter for general adult medical examination without abnormal findings: Secondary | ICD-10-CM | POA: Insufficient documentation

## 2017-07-30 DIAGNOSIS — Z1231 Encounter for screening mammogram for malignant neoplasm of breast: Secondary | ICD-10-CM | POA: Insufficient documentation

## 2017-08-07 ENCOUNTER — Inpatient Hospital Stay
Admission: RE | Admit: 2017-08-07 | Discharge: 2017-08-07 | Disposition: A | Discharge status: Home / Self Care | Payer: Self-pay | Source: Ambulatory Visit | Attending: *Deleted | Admitting: *Deleted

## 2017-08-07 ENCOUNTER — Other Ambulatory Visit: Payer: Self-pay | Admitting: *Deleted

## 2017-08-07 DIAGNOSIS — Z9289 Personal history of other medical treatment: Secondary | ICD-10-CM

## 2017-09-16 ENCOUNTER — Ambulatory Visit
Admission: RE | Admit: 2017-09-16 | Discharge: 2017-09-16 | Disposition: A | Discharge status: Home / Self Care | Payer: PPO | Source: Ambulatory Visit | Attending: Family | Admitting: Family

## 2017-09-16 ENCOUNTER — Encounter: Payer: Self-pay | Admitting: Internal Medicine

## 2017-09-16 DIAGNOSIS — M85851 Other specified disorders of bone density and structure, right thigh: Secondary | ICD-10-CM | POA: Insufficient documentation

## 2017-09-16 DIAGNOSIS — M8580 Other specified disorders of bone density and structure, unspecified site: Secondary | ICD-10-CM | POA: Insufficient documentation

## 2017-09-16 DIAGNOSIS — M4186 Other forms of scoliosis, lumbar region: Secondary | ICD-10-CM | POA: Insufficient documentation

## 2017-09-16 DIAGNOSIS — Z780 Asymptomatic menopausal state: Secondary | ICD-10-CM | POA: Diagnosis not present

## 2017-09-16 DIAGNOSIS — M47896 Other spondylosis, lumbar region: Secondary | ICD-10-CM | POA: Insufficient documentation

## 2017-09-16 DIAGNOSIS — Z13820 Encounter for screening for osteoporosis: Secondary | ICD-10-CM | POA: Insufficient documentation

## 2017-09-16 DIAGNOSIS — F17200 Nicotine dependence, unspecified, uncomplicated: Secondary | ICD-10-CM | POA: Diagnosis not present

## 2017-09-16 DIAGNOSIS — Z0000 Encounter for general adult medical examination without abnormal findings: Secondary | ICD-10-CM | POA: Insufficient documentation

## 2017-10-07 ENCOUNTER — Ambulatory Visit (INDEPENDENT_AMBULATORY_CARE_PROVIDER_SITE_OTHER): Payer: PPO

## 2017-10-07 VITALS — BP 140/70 | HR 84 | Temp 98.3°F | Resp 14 | Ht 65.0 in | Wt 145.8 lb

## 2017-10-07 DIAGNOSIS — Z0000 Encounter for general adult medical examination without abnormal findings: Secondary | ICD-10-CM | POA: Diagnosis not present

## 2017-10-07 NOTE — Progress Notes (Signed)
Subjective:   Wanda Clayton is a 66 y.o. female who presents for an Initial Medicare Annual Wellness Visit.  Review of Systems    No ROS.  Medicare Wellness Visit. Additional risk factors are reflected in the social history.  Cardiac Risk Factors include: advanced age (>22men, >30 women)     Objective:    Today's Vitals   10/07/17 0824  BP: 140/70  Pulse: 84  Resp: 14  Temp: 98.3 F (36.8 C)  TempSrc: Oral  SpO2: 95%  Weight: 145 lb 12.8 oz (66.1 kg)  Height: 5\' 5"  (1.651 m)   Body mass index is 24.26 kg/m.  Advanced Directives 10/07/2017  Does Patient Have a Medical Advance Directive? Yes  Type of Paramedic of [place];Living will  Copy of [place] in Chart? No - copy requested    Current Medications (verified) Outpatient Encounter Medications as of 10/07/2017  Medication Sig  . atorvastatin (LIPITOR) 40 MG tablet Take 2 tablets (80 mg total) by mouth daily at 6 PM.  . cholecalciferol (VITAMIN D) 1000 UNITS tablet Take 1,000 Units by mouth daily.  Marland Kitchen FLUoxetine (PROZAC) 40 MG capsule TAKE 1 CAPSULE (40 MG TOTAL) BY MOUTH DAILY.  . Multiple Vitamins-Minerals (MULTIVITAMIN WITH MINERALS) tablet Take 1 tablet by mouth daily.  . [DISCONTINUED] atorvastatin (LIPITOR) 40 MG tablet TAKE 1 TABLET BY MOUTH EVERY DAY   No facility-administered encounter medications on file as of 10/07/2017.     Allergies (verified) Pollen extract   History: Past Medical History:  Diagnosis Date  . Anxiety   . Depression    Past Surgical History:  Procedure Laterality Date  . carpal tunnel repair    . DENTAL SURGERY    . TUBAL LIGATION    . VAGINAL DELIVERY     x1   Family History  Problem Relation Age of Onset  . Hypertension Mother   . Heart disease Father   . Diabetes Father   . COPD Sister   . Heart disease Brother   . Crohn's disease Sister   . Breast cancer Neg Hx    Social History   Socioeconomic History  .  Marital status: Married    Spouse name: None  . Number of children: 1  . Years of education: None  . Highest education level: None  Social Needs  . Financial resource strain: Not hard at all  . Food insecurity - worry: Never true  . Food insecurity - inability: Never true  . Transportation needs - medical: No  . Transportation needs - non-medical: No  Occupational History    Employer: [employer]  Tobacco Use  . Smoking status: Current Every Day Smoker    Packs/day: 1.00    Years: 35.00    Pack years: 35.00    Types: Cigarettes  . Smokeless tobacco: Never Used  . Tobacco comment: she plans to quit without medication  Substance and Sexual Activity  . Alcohol use: Yes    Comment: Daily glass wine x2  . Drug use: No  . Sexual activity: No  Other Topics Concern  . None  Social History Narrative   Works for [employer], retired 06/2016   Married.   Daughter and granddaughter.           Tobacco Counseling Ready to quit: Yes Counseling given: Yes Comment: she plans to quit without medication   Clinical Intake:  Pre-visit preparation completed: Yes  Pain : No/denies pain     Nutritional  Status: BMI 25 -29 Overweight Diabetes: No  How often do you need to have someone help you when you read instructions, pamphlets, or other written materials from your doctor or pharmacy?: 1 - Never  Interpreter Needed?: No      Activities of Daily Living In your present state of health, do you have any difficulty performing the following activities: 10/07/2017  Hearing? Y  Vision? N  Difficulty concentrating or making decisions? N  Walking or climbing stairs? N  Dressing or bathing? N  Doing errands, shopping? N  Preparing Food and eating ? N  Using the Toilet? N  In the past six months, have you accidently leaked urine? Y  Comment Managed with a daily pad  Do you have problems with loss of bowel control? Y  Managing your Medications? N  Managing your Finances? N    Housekeeping or managing your Housekeeping? N  Some recent data might be hidden     Immunizations and Health Maintenance Immunization History  Administered Date(s) Administered  . Td 02/13/2016   There are no preventive care reminders to display for this patient.  Patient Care Team: Burnard Hawthorne, FNP as PCP - General (Family Medicine)  Indicate any recent Medical Services you may have received from other than [facility] providers in the past year (date may be approximate).     Assessment:   This is a routine wellness examination for Kaydie. The goal of the wellness visit is to assist the patient how to close the gaps in care and create a preventative care plan for the patient.   The roster of all physicians providing medical care to patient is listed in the Snapshot section of the chart.  Taking calcium VIT D as appropriate/Osteoporosis risk reviewed.    Safety issues reviewed; Smoke and carbon monoxide detectors in the home. No firearms in the home.  Wears seatbelts when driving or riding with others. Patient does wear sunscreen or protective clothing when in direct sunlight. No violence in the home.  Patient is alert, normal appearance, oriented to person/place/and time. Correctly identified the president of the [place], recall of 3/3 words, and performing simple calculations. Displays appropriate judgement and can read correct time from watch face.   No new identified risk were noted.  No failures at ADL's or IADL's.   BMI- discussed the importance of a healthy diet, water intake and the benefits of aerobic exercise. Educational material provided.   24 hour diet recall: She does not monitor what she eats Low carb diet encouraged  Daily fluid intake: 2-3 cups of caffeine, 1 cup of water Increase water intake encouraged  Dental- every 4 months.  Dr. Toy Cookey.  Eye- Visual acuity not assessed per patient preference since they have regular follow up with the ophthalmologist.   Wears corrective lenses.  Sleep patterns- Sleeps 7 hours at night.    Health maintenance gaps- closed.  Patient Concerns: Vertigo x1 within the last week;  No symptoms today.  Appointment offered to follow up with primary care physician, declined. Encouraged to monitor and follow up with pcp if episodes continue.  Hearing/Vision screen Hearing Screening Comments: Followed by [facility] Hearing aid, bilateral Vision Screening Comments: Followed by Dr. Gloriann Loan Wears corrective lenses Last OV 08/2017 Visual acuity not assessed per patient preference since they have regular follow up with the ophthalmologist  Dietary issues and exercise activities discussed: Current Exercise Habits: The patient does not participate in regular exercise at present  Goals    .  INCREASE WATER INTAKE     Stay hydrated    . Increase physical activity     Walk for exercise on treadmill and/or through the neighborhood 3x weekly, 30 minutes.   Increase as tolerated.      . Quit Smoking      Depression Screen PHQ 2/9 Scores 10/07/2017 06/29/2017 06/26/2016  PHQ - 2 Score 0 0 0  PHQ- 9 Score - 0 -    Fall Risk Fall Risk  10/07/2017 06/29/2017 06/26/2016  Falls in the past year? No No No    I  Cognitive Function: MMSE - Mini Mental State Exam 10/07/2017  Orientation to time 5  Orientation to Place 5  Registration 3  Attention/ Calculation 5  Recall 3  Language- name 2 objects 2  Language- repeat 1  Language- follow 3 step command 3  Language- read & follow direction 1  Write a sentence 1  Copy design 1  Total score 30        Screening Tests Health Maintenance  Topic Date Due  . INFLUENZA VACCINE  07/03/2023 (Originally 05/13/2017)  . PNA vac Low Risk Adult (2 of 2 - PPSV23) 06/29/2018  . MAMMOGRAM  07/31/2019  . COLONOSCOPY  07/22/2021  . TETANUS/TDAP  02/12/2026  . DEXA SCAN  Completed  . Hepatitis C Screening  Completed      Plan:    End of life planning; Advance aging; Advanced  directives discussed. Copy of current HCPOA/Living Will requested.    I have personally reviewed and noted the following in the patient's chart:   . Medical and social history . Use of alcohol, tobacco or illicit drugs  . Current medications and supplements . Functional ability and status . Nutritional status . Physical activity . Advanced directives . List of other physicians . Hospitalizations, surgeries, and ER visits in previous 12 months . Vitals . Screenings to include cognitive, depression, and falls . Referrals and appointments  In addition, I have reviewed and discussed with patient certain preventive protocols, quality metrics, and best practice recommendations. A written personalized care plan for preventive services as well as general preventive health recommendations were provided to patient.     Varney Biles, LPN   02/40/9735

## 2017-10-07 NOTE — Patient Instructions (Addendum)
  Wanda Clayton , Thank you for taking time to come for your Medicare Wellness Visit. I appreciate your ongoing commitment to your health goals. Please review the following plan we discussed and let me know if I can assist you in the future.   Follow up with [name] as needed.    Bring a copy of your [place] and/or Living Will to be scanned into chart.  Monitor dizzy/spinning episodes and contact your physician if symptoms increase.  Have a great day!  These are the goals we discussed: Goals    .  INCREASE WATER INTAKE     Stay hydrated    . Increase physical activity     Walk for exercise on treadmill and/or through the neighborhood 3x weekly, 30 minutes.   Increase as tolerated.      . Quit Smoking       This is a list of the screening recommended for you and due dates:  Health Maintenance  Topic Date Due  . Flu Shot  07/03/2023*  . Pneumonia vaccines (2 of 2 - PPSV23) 06/29/2018  . Mammogram  07/31/2019  . Colon Cancer Screening  07/22/2021  . Tetanus Vaccine  02/12/2026  . DEXA scan (bone density measurement)  Completed  .  Hepatitis C: One time screening is recommended by Center for Disease Control  (CDC) for  adults born from [birth year] through 1965.   Completed  *Topic was postponed. The date shown is not the original due date.

## 2017-10-27 ENCOUNTER — Ambulatory Visit: Payer: PPO

## 2017-11-26 ENCOUNTER — Telehealth: Payer: Self-pay | Admitting: Family

## 2017-11-26 ENCOUNTER — Encounter: Payer: Self-pay | Admitting: Family

## 2017-11-26 DIAGNOSIS — I700 Atherosclerosis of aorta: Secondary | ICD-10-CM | POA: Insufficient documentation

## 2017-11-26 NOTE — Telephone Encounter (Signed)
Please mail letter to patient:   Wanda Clayton, Wanda Clayton you are doing well.   I received results from your annual CT lung scan from [facility].  It was noted on the exam that you have significant coronary artery atherosclerosis, or often referred to as hardening of the arteries.   This can put you at risk for stroke, heart attack.   Please make a follow up appointment with me so we can discuss lifestyle changes, if there is a need for cardiology evaluation, and how you are doing on the lipitor. I had recommended increasing last year and would like to repeat a cholesterol panel as well to see if effective in getting cholesterol to goal.    Look forward to seeing you!   Best,   [name], NP

## 2017-11-27 NOTE — Telephone Encounter (Signed)
Letter sent.

## 2017-11-27 NOTE — Telephone Encounter (Deleted)
Please mail letter to patient:   Ms Wanda, Clayton you are doing well.   I received results from your annual CT lung scan from [facility].  It was noted on the exam that you have significant coronary artery atherosclerosis, or often referred to as hardening of the arteries.   This can put you at risk for stroke, heart attack.   Please make a follow up appointment with me so we can discuss lifestyle changes, if there is a need for cardiology evaluation, and how you are doing on the lipitor. I had recommended increasing last year and would like to repeat a cholesterol panel as well to see if effective in getting cholesterol to goal.    Look forward to seeing you!   Best,   [name], NP

## 2017-12-09 ENCOUNTER — Ambulatory Visit: Payer: PPO | Admitting: Family

## 2018-01-06 ENCOUNTER — Ambulatory Visit (INDEPENDENT_AMBULATORY_CARE_PROVIDER_SITE_OTHER): Payer: PPO | Admitting: Family

## 2018-01-06 ENCOUNTER — Encounter: Payer: Self-pay | Admitting: Family

## 2018-01-06 VITALS — BP 170/80 | HR 77 | Temp 98.3°F | Resp 15 | Wt 143.4 lb

## 2018-01-06 DIAGNOSIS — E785 Hyperlipidemia, unspecified: Secondary | ICD-10-CM | POA: Diagnosis not present

## 2018-01-06 DIAGNOSIS — R030 Elevated blood-pressure reading, without diagnosis of hypertension: Secondary | ICD-10-CM

## 2018-01-06 DIAGNOSIS — I159 Secondary hypertension, unspecified: Secondary | ICD-10-CM | POA: Diagnosis not present

## 2018-01-06 DIAGNOSIS — R42 Dizziness and giddiness: Secondary | ICD-10-CM | POA: Diagnosis not present

## 2018-01-06 DIAGNOSIS — I700 Atherosclerosis of aorta: Secondary | ICD-10-CM | POA: Diagnosis not present

## 2018-01-06 LAB — LIPID PANEL
Cholesterol: 190 mg/dL (ref 0–200)
HDL: 90.5 mg/dL
LDL Cholesterol: 68 mg/dL (ref 0–99)
NonHDL: 99.15
Total CHOL/HDL Ratio: 2
Triglycerides: 154 mg/dL — ABNORMAL HIGH (ref 0.0–149.0)
VLDL: 30.8 mg/dL (ref 0.0–40.0)

## 2018-01-06 MED ORDER — AMLODIPINE BESYLATE 2.5 MG PO TABS: 2.5000 mg | ORAL_TABLET | Freq: Every day | ORAL | 3 refills | Status: DC

## 2018-01-06 NOTE — Patient Instructions (Signed)
  Start amlodipine  If vertigo OR flashing light symptom recurs, let me know asap as I would advise further evaluation and also MRI brain.  Monitor blood pressure,  Goal is less than 130/80; if persistently higher, please make sooner follow up appointment so we can recheck you blood pressure and manage medications   Today we discussed referrals, orders. Referral to cardiology.    I have placed these orders in the system for you.  Please be sure to give [place] a call if you have not heard from our office regarding scheduling a test or regarding referral in a timely manner.  It is very important that you let me know as soon as possible.

## 2018-01-06 NOTE — Assessment & Plan Note (Signed)
On statin. Pending cardiology consult due to CVD risk factors. Pending lipid panel.

## 2018-01-06 NOTE — Progress Notes (Signed)
Subjective:    [name], female    DOB: 1951-01-17, 67 y.o.   MRN: [id number]  CC: Wanda Clayton is a 67 y.o. female who presents today for follow up.   HPI: Here today to discuss atherosclerosis seen on CT chest ( BI RADS-2). Continues to smoke.  Patient compliant with Lipitor  No formal exercise- walks with granddaughter and also cleans house. Denies exertional chest pain or pressure, numbness or tingling radiating to left arm or jaw, palpitations, dizziness, frequent headaches, changes in vision, or shortness of breath.   Elevated blood pressure- doesn't check BP often at home.   States seen in December and reports an episode in December 2018 where room was spinning. NO congestion at that time. Occurred when laying on back and rolled over to husband.  Chronic tinnitus, hearing loss ( wears hearing aids). NO ear pain, pressure.   No v, nausea, HA, acute vision changes. Does note lights flashing in bilateral peripheral vision after looking at computer screen which lasts for 10 minutes, resolves on its own. Episode occurs once approx 3-4 months. An episode did occur yesterday which is why she remembered to mention it today. Told migraine in the past. Denies a HA.   No vertigo episode since December.   No cardiac history.   Depression- doing well on prozac   HISTORY:  Past Medical History:  Diagnosis Date  . Anxiety   . Depression    Past Surgical History:  Procedure Laterality Date  . carpal tunnel repair    . DENTAL SURGERY    . TUBAL LIGATION    . VAGINAL DELIVERY     x1   Family History  Problem Relation Age of Onset  . Hypertension Mother   . Heart disease Father   . Diabetes Father   . COPD Sister   . Heart disease Brother   . Crohn's disease Sister   . Breast cancer Neg Hx     Allergies: Pollen extract Current Outpatient Medications on File Prior to Visit  Medication Sig Dispense Refill  . atorvastatin (LIPITOR) 40 MG tablet Take 2 tablets  (80 mg total) by mouth daily at 6 PM. 120 tablet 4  . cholecalciferol (VITAMIN D) 1000 UNITS tablet Take 1,000 Units by mouth daily.    Marland Kitchen FLUoxetine (PROZAC) 40 MG capsule TAKE 1 CAPSULE (40 MG TOTAL) BY MOUTH DAILY. 90 capsule 4  . Multiple Vitamins-Minerals (MULTIVITAMIN WITH MINERALS) tablet Take 1 tablet by mouth daily.     No current facility-administered medications on file prior to visit.     Social History   Tobacco Use  . Smoking status: Current Every Day Smoker    Packs/day: 1.00    Years: 35.00    Pack years: 35.00    Types: Cigarettes  . Smokeless tobacco: Never Used  . Tobacco comment: she plans to quit without medication  Substance Use Topics  . Alcohol use: Yes    Comment: Daily glass wine x2  . Drug use: No    Review of Systems  Constitutional: Negative for chills and fever.  HENT: Negative for congestion.   Eyes: Positive for visual disturbance. Negative for photophobia and redness.  Respiratory: Negative for cough.   Cardiovascular: Negative for chest pain and palpitations.  Gastrointestinal: Negative for nausea and vomiting.  Neurological: Negative for dizziness, seizures, syncope and headaches.  Psychiatric/Behavioral: Negative for sleep disturbance. The patient is not nervous/anxious.       Objective:    BP Marland Kitchen)  170/80   Pulse 77   Temp 98.3 F (36.8 C) (Oral)   Resp 15   Wt 143 lb 6 oz (65 kg)   SpO2 98%   BMI 23.86 kg/m  BP Readings from Last 3 Encounters:  01/06/18 (!) 170/80  10/07/17 140/70  06/29/17 (!) 142/88   Wt Readings from Last 3 Encounters:  01/06/18 143 lb 6 oz (65 kg)  10/07/17 145 lb 12.8 oz (66.1 kg)  07/16/17 150 lb (68 kg)    Physical Exam  Constitutional: She appears well-developed and well-nourished.  HENT:  Head: Normocephalic and atraumatic.  Right Ear: Hearing, tympanic membrane, external ear and ear canal normal. No swelling or tenderness. Tympanic membrane is not erythematous and not bulging. No middle ear  effusion.  Left Ear: Tympanic membrane, external ear and ear canal normal. No swelling or tenderness. Tympanic membrane is not erythematous and not bulging.  No middle ear effusion.  Nose: Nose normal. No rhinorrhea. Right sinus exhibits no maxillary sinus tenderness and no frontal sinus tenderness. Left sinus exhibits no maxillary sinus tenderness and no frontal sinus tenderness.  Mouth/Throat: Uvula is midline, oropharynx is clear and moist and mucous membranes are normal. No posterior oropharyngeal edema or posterior oropharyngeal erythema.  Eyes: Pupils are equal, round, and reactive to light. Conjunctivae, EOM and lids are normal. Lids are everted and swept, no foreign bodies found.  Normal fundus bilaterally  Cardiovascular: Normal rate, regular rhythm, normal heart sounds and normal pulses.  Pulmonary/Chest: Effort normal and breath sounds normal. She has no wheezes. She has no rhonchi. She has no rales.  Lymphadenopathy:       Head (right side): No submental, no submandibular, no tonsillar, no preauricular, no posterior auricular and no occipital adenopathy present.       Head (left side): No submental, no submandibular, no tonsillar, no preauricular, no posterior auricular and no occipital adenopathy present.    She has no cervical adenopathy.       Right cervical: No superficial cervical, no deep cervical and no posterior cervical adenopathy present.      Left cervical: No superficial cervical, no deep cervical and no posterior cervical adenopathy present.  Neurological: She is alert. She has normal strength. No cranial nerve deficit or sensory deficit. She displays a negative Romberg sign.  Reflex Scores:      Bicep reflexes are 2+ on the right side and 2+ on the left side.      Patellar reflexes are 2+ on the right side and 2+ on the left side. Grip equal and strong bilateral upper extremities. Gait strong and steady. Able to perform  finger-to-nose without difficulty.  Dix hall pike  maneuver did not elicit vertigo. No nystagmus noted.    Skin: Skin is warm and dry.  Psychiatric: She has a normal mood and affect. Her speech is normal and behavior is normal. Thought content normal.  Vitals reviewed.      Assessment & Plan:   Problem List Items Addressed This Visit      Cardiovascular and Mediastinum   HTN (hypertension) - Primary    Elevated again. Will start amlodipine. Patient will monitor and let me know if not at goal      Relevant Medications   amLODipine (NORVASC) 2.5 MG tablet   Atherosclerosis of aorta (HCC)    On statin. Pending cardiology consult due to CVD risk factors. Pending lipid panel.       Relevant Medications   amLODipine (NORVASC) 2.5 MG tablet  Other Relevant Orders   Lipid panel   Ambulatory referral to Cardiology     Other   Hyperlipidemia   Relevant Medications   amLODipine (NORVASC) 2.5 MG tablet   Other Relevant Orders   Lipid panel   Vertigo    Last episode 4 months ago. ? BPPV. Normal neurologic exam. Advised MRI brain due to flashing light symptom. Although this may be from the computer screen,  And benign, I would feel more comfortable pursuing imaging. Patient politely declines MRI brain at this time and will let me know if symptom recurs or new symptoms develop.           I am having Nusrat W. Balducci start on amLODipine. I am also having her maintain her cholecalciferol, multivitamin with minerals, FLUoxetine, and atorvastatin.   Meds ordered this encounter  Medications  . amLODipine (NORVASC) 2.5 MG tablet    Sig: Take 1 tablet (2.5 mg total) by mouth daily.    Dispense:  90 tablet    Refill:  3    Order Specific Question:   Supervising Provider    Answer:   Crecencio Mc [[id number]]    Return precautions given.   Risks, benefits, and alternatives of the medications and treatment plan prescribed today were discussed, and patient expressed understanding.   Education regarding symptom management and diagnosis  given to patient on AVS.  Continue to follow with Burnard Hawthorne, FNP for routine health maintenance.   Wanda Clayton and I agreed with plan.   [name], FNP

## 2018-01-06 NOTE — Assessment & Plan Note (Addendum)
Last episode 4 months ago. ? BPPV. Normal neurologic exam. Advised MRI brain due to flashing light symptom. Although this may be from the computer screen,  And benign, I would feel more comfortable pursuing imaging. Patient politely declines MRI brain at this time and will let me know if symptom recurs or new symptoms develop.

## 2018-01-06 NOTE — Assessment & Plan Note (Signed)
Elevated again. Will start amlodipine. Patient will monitor and let me know if not at goal

## 2018-02-20 ENCOUNTER — Other Ambulatory Visit: Payer: Self-pay | Admitting: Family

## 2018-02-21 DIAGNOSIS — F17200 Nicotine dependence, unspecified, uncomplicated: Secondary | ICD-10-CM

## 2018-02-21 HISTORY — DX: Nicotine dependence, unspecified, uncomplicated: F17.200

## 2018-02-21 NOTE — Progress Notes (Signed)
Cardiology Office Note  Date:  02/22/2018   ID:  Wanda, Clayton 12-Aug-1951, MRN [id number]  PCP:  Burnard Hawthorne, FNP   Chief Complaint  Patient presents with  . other    Ref by Kathrin Penner. NP for HTN. Meds reviewed by the pt. verbally. Pt. c/o shortness of breath with pain in left arm.     HPI:  [name] is a 67 yo woman with past medical history of Smoker, avg 1 ppd atherosclerosis seen on CT chest  Hyperlipidemia Hypertension Depression/anxiety Referred by [name] for aortic atherosclerosis and hypertension  She reports feeling relatively well, no chest pain or shortness of breath on exertion Continues to smoke, "Just need to put them down"  No regular exercise program Otherwise active at baseline  She does report some discomfort in her legs when she goes walking.  Denies having chest pain or arm pain when she is exerting herself   episode in December 2018 where room was spinning.  Occurred when laying on back and rolled over to husband.   Chronic tinnitus, hearing loss ( wears hearing aids).   CT scan images pulled up in the office today and reviewed with her in detail Mild to moderate aortic atherosclerosis in the arch, carotids Also noted in the coronary arteries, proximal and mid LAD, And proximal RCA  EKG personally reviewed by myself on todays visit Shows normal sinus rhythm with rate 83 bpm no significant ST or T-wave changes   PMH:   has a past medical history of Anxiety and Depression.  PSH:    Past Surgical History:  Procedure Laterality Date  . carpal tunnel repair    . DENTAL SURGERY    . TUBAL LIGATION    . VAGINAL DELIVERY     x1    Current Outpatient Medications  Medication Sig Dispense Refill  . amLODipine (NORVASC) 2.5 MG tablet Take 1 tablet (2.5 mg total) by mouth daily. 90 tablet 3  . atorvastatin (LIPITOR) 40 MG tablet Take 2 tablets (80 mg total) by mouth daily at 6 PM. 120 tablet 4  . cholecalciferol  (VITAMIN D) 1000 UNITS tablet Take 1,000 Units by mouth daily.    . Multiple Vitamins-Minerals (MULTIVITAMIN WITH MINERALS) tablet Take 1 tablet by mouth daily.    Marland Kitchen ezetimibe (ZETIA) 10 MG tablet Take 1 tablet (10 mg total) by mouth daily. 90 tablet 3  . FLUoxetine (PROZAC) 40 MG capsule TAKE 1 CAPSULE (40 MG TOTAL) BY MOUTH DAILY. 90 capsule 1   No current facility-administered medications for this visit.      Allergies:   Pollen extract   Social History:  The patient  reports that she has been smoking cigarettes.  She has a 35.00 pack-year smoking history. She has never used smokeless tobacco. She reports that she drinks alcohol. She reports that she does not use drugs.   Family History:   family history includes COPD in her sister; Crohn's disease in her sister; Diabetes in her father; Heart disease in her brother and father; Hypertension in her mother.    Review of Systems: Review of Systems  Constitutional: Negative.   Respiratory: Negative.   Cardiovascular: Negative.   Gastrointestinal: Negative.   Musculoskeletal: Negative.        Leg discomfort when walking long distances  Neurological: Negative.   Psychiatric/Behavioral: Negative.   All other systems reviewed and are negative.    PHYSICAL EXAM: VS:  BP (!) 150/88 (BP Location: Right Arm, Patient Position:  Sitting, Cuff Size: Normal)   Pulse 83   Ht 5\' 5"  (1.651 m)   Wt 142 lb 8 oz (64.6 kg)   BMI 23.71 kg/m  , BMI Body mass index is 23.71 kg/m. GEN: Well nourished, well developed, in no acute distress  HEENT: normal  Neck: no JVD, carotid bruits, or masses Cardiac: RRR; no murmurs, rubs, or gallops,no edema  Respiratory:  clear to auscultation bilaterally, normal work of breathing GI: soft, nontender, nondistended, + BS MS: no deformity or atrophy  Skin: warm and dry, no rash Neuro:  Strength and sensation are intact Psych: euthymic mood, full affect    Recent Labs: 06/29/2017: ALT 18; BUN 12; Creatinine,  Ser 0.83; Hemoglobin 14.5; Platelets 342.0; Potassium 4.0; Sodium 137; TSH 2.15    Lipid Panel Lab Results  Component Value Date   CHOL 190 01/06/2018   HDL 90.50 01/06/2018   LDLCALC 68 01/06/2018   TRIG 154.0 (H) 01/06/2018      Wt Readings from Last 3 Encounters:  02/22/18 142 lb 8 oz (64.6 kg)  01/06/18 143 lb 6 oz (65 kg)  10/07/17 145 lb 12.8 oz (66.1 kg)       ASSESSMENT AND PLAN:  Atherosclerosis of aorta ([place]) - Plan: EKG 12-Lead Mild to moderate diffuse disease in her aorta Recommended smoking cessation  Essential hypertension - Plan: EKG 12-Lead Blood pressure is well controlled on today's visit. No changes made to the medications.  Mixed hyperlipidemia We will add Zetia to her Lipitor to achieve LDL less than 70  Smoker We have encouraged her to continue to work on weaning her cigarettes and smoking cessation. She will continue to work on this and does not want any assistance with chantix.   Coronary artery calcification seen on CT scan Significant coronary calcification of the LAD and RCA seen on CT scan images Discussed various treatment options We have offered stress testing, she has declined at this time but will call us if she develops any symptoms concerning for angina Long discussion concerning anginal symptoms to watch for  Leg discomfort In peripheral vascular disease Possible claudication symptoms We did offer ankle-brachial indexes in lower stomach arterial Dopplers She will call us if she would like to have this done Recommended she call if symptoms get worse  Disposition:   F/U 12 months   Total encounter time more than 60 minutes  Greater than 50% was spent in counseling and coordination of care with the patient   Orders Placed This Encounter  Procedures  . EKG 12-Lead     Signed, Esmond Plants, M.D., Ph.D. 02/22/2018  [place], [place]

## 2018-02-22 ENCOUNTER — Ambulatory Visit (INDEPENDENT_AMBULATORY_CARE_PROVIDER_SITE_OTHER): Payer: PPO | Admitting: Cardiovascular Disease

## 2018-02-22 ENCOUNTER — Encounter: Payer: Self-pay | Admitting: Cardiovascular Disease

## 2018-02-22 VITALS — BP 150/88 | HR 83 | Ht 65.0 in | Wt 142.5 lb

## 2018-02-22 DIAGNOSIS — I700 Atherosclerosis of aorta: Secondary | ICD-10-CM | POA: Diagnosis not present

## 2018-02-22 DIAGNOSIS — I10 Essential (primary) hypertension: Secondary | ICD-10-CM | POA: Diagnosis not present

## 2018-02-22 DIAGNOSIS — I2510 Atherosclerotic heart disease of native coronary artery without angina pectoris: Secondary | ICD-10-CM

## 2018-02-22 DIAGNOSIS — F17200 Nicotine dependence, unspecified, uncomplicated: Secondary | ICD-10-CM | POA: Diagnosis not present

## 2018-02-22 DIAGNOSIS — E782 Mixed hyperlipidemia: Secondary | ICD-10-CM | POA: Diagnosis not present

## 2018-02-22 DIAGNOSIS — I739 Peripheral vascular disease, unspecified: Secondary | ICD-10-CM | POA: Diagnosis not present

## 2018-02-22 MED ORDER — EZETIMIBE 10 MG PO TABS: 10.0000 mg | ORAL_TABLET | Freq: Every day | ORAL | 3 refills | Status: DC

## 2018-02-22 NOTE — Patient Instructions (Addendum)
Medication Instructions:   Monitor blood pressure at home, Call with numbers  Please start zetia one a day for cholesterol  Labwork:  No new labs needed  Testing/Procedures:  No further testing at this time  If leg pain gets worse, Call the office We could do ankle brachial indexes  Call for any chest tightness, worsening shortness of breath, Call the office, \we could order a stress test Wanda Clayton)   Follow-Up: It was a pleasure seeing you in the office today. Please call us if you have new issues that need to be addressed before your next appt.  [phone]  Your physician wants you to follow-up in: 12 months.  You will receive a reminder letter in the mail two months in advance. If you don't receive a letter, please call our office to schedule the follow-up appointment.  If you need a refill on your cardiac medications before your next appointment, please call your pharmacy.  For educational health videos Log in to : [email or web address] Or : [email or web address], password : [email or web address]

## 2018-05-25 DIAGNOSIS — H903 Sensorineural hearing loss, bilateral: Secondary | ICD-10-CM | POA: Diagnosis not present

## 2018-06-16 ENCOUNTER — Other Ambulatory Visit: Payer: Self-pay | Admitting: Family

## 2018-06-16 DIAGNOSIS — Z1231 Encounter for screening mammogram for malignant neoplasm of breast: Secondary | ICD-10-CM

## 2018-06-21 ENCOUNTER — Other Ambulatory Visit: Payer: Self-pay | Admitting: Family

## 2018-06-21 DIAGNOSIS — E785 Hyperlipidemia, unspecified: Secondary | ICD-10-CM

## 2018-06-30 ENCOUNTER — Ambulatory Visit (INDEPENDENT_AMBULATORY_CARE_PROVIDER_SITE_OTHER): Payer: PPO | Admitting: Family

## 2018-06-30 ENCOUNTER — Other Ambulatory Visit (HOSPITAL_COMMUNITY)
Admission: RE | Admit: 2018-06-30 | Discharge: 2018-06-30 | Disposition: A | Discharge status: Home / Self Care | Payer: PPO | Source: Ambulatory Visit | Attending: Family | Admitting: Family

## 2018-06-30 VITALS — BP 162/72 | HR 84 | Temp 98.5°F | Resp 15 | Ht 64.75 in | Wt 143.2 lb

## 2018-06-30 DIAGNOSIS — F418 Other specified anxiety disorders: Secondary | ICD-10-CM

## 2018-06-30 DIAGNOSIS — R51 Headache: Secondary | ICD-10-CM | POA: Diagnosis not present

## 2018-06-30 DIAGNOSIS — R519 Headache, unspecified: Secondary | ICD-10-CM

## 2018-06-30 DIAGNOSIS — R251 Tremor, unspecified: Secondary | ICD-10-CM

## 2018-06-30 DIAGNOSIS — R42 Dizziness and giddiness: Secondary | ICD-10-CM

## 2018-06-30 DIAGNOSIS — Z0000 Encounter for general adult medical examination without abnormal findings: Secondary | ICD-10-CM

## 2018-06-30 DIAGNOSIS — I10 Essential (primary) hypertension: Secondary | ICD-10-CM

## 2018-06-30 LAB — COMPREHENSIVE METABOLIC PANEL
ALT: 15 U/L (ref 0–35)
AST: 16 U/L (ref 0–37)
Albumin: 4.1 g/dL (ref 3.5–5.2)
Alkaline Phosphatase: 103 U/L (ref 39–117)
BILIRUBIN TOTAL: 0.6 mg/dL (ref 0.2–1.2)
BUN: 9 mg/dL (ref 6–23)
CO2: 28 meq/L (ref 19–32)
Calcium: 9.3 mg/dL (ref 8.4–10.5)
Chloride: 105 mEq/L (ref 96–112)
Creatinine, Ser: 0.71 mg/dL (ref 0.40–1.20)
GFR: 87.22 mL/min (ref >60.00)
GLUCOSE: 92 mg/dL (ref 70–99)
Potassium: 4.2 mEq/L (ref 3.5–5.1)
Sodium: 138 mEq/L (ref 135–145)
Total Protein: 7.2 g/dL (ref 6.0–8.3)

## 2018-06-30 LAB — CBC WITH DIFFERENTIAL/PLATELET
BASOS ABS: 0.1 10*3/uL (ref 0.0–0.1)
BASOS PCT: 0.8 % (ref 0.0–3.0)
EOS PCT: 1.6 % (ref 0.0–5.0)
Eosinophils Absolute: 0.1 10*3/uL (ref 0.0–0.7)
HCT: 43 % (ref 36.0–46.0)
Hemoglobin: 14.4 g/dL (ref 12.0–15.0)
LYMPHS ABS: 1.7 10*3/uL (ref 0.7–4.0)
Lymphocytes Relative: 22 % (ref 12.0–46.0)
MCHC: 33.6 g/dL (ref 30.0–36.0)
MCV: 93.7 fl (ref 78.0–100.0)
MONOS PCT: 7.8 % (ref 3.0–12.0)
Monocytes Absolute: 0.6 10*3/uL (ref 0.1–1.0)
NEUTROS PCT: 67.8 % (ref 43.0–77.0)
Neutro Abs: 5.3 10*3/uL (ref 1.4–7.7)
PLATELETS: 339 10*3/uL (ref 150.0–400.0)
RBC: 4.59 Mil/uL (ref 3.87–5.11)
RDW: 14 % (ref 11.5–15.5)
WBC: 7.8 10*3/uL (ref 4.0–10.5)

## 2018-06-30 LAB — LIPID PANEL
CHOL/HDL RATIO: 2
Cholesterol: 174 mg/dL (ref 0–200)
HDL: 73.8 mg/dL (ref >39.00)
LDL CALC: 67 mg/dL (ref 0–99)
NonHDL: 100.47
TRIGLYCERIDES: 166 mg/dL — AB (ref 0.0–149.0)
VLDL: 33.2 mg/dL (ref 0.0–40.0)

## 2018-06-30 LAB — VITAMIN D 25 HYDROXY (VIT D DEFICIENCY, FRACTURES): VITD: 51.15 ng/mL (ref 30.00–100.00)

## 2018-06-30 LAB — TSH: TSH: 1.68 u[IU]/mL (ref 0.35–4.50)

## 2018-06-30 LAB — HEMOGLOBIN A1C: Hgb A1c MFr Bld: 5.6 % (ref 4.6–6.5)

## 2018-06-30 NOTE — Assessment & Plan Note (Signed)
Elevated today however dramatically improved after resting in exam room.  Do suspect some degree of anxiety regarding Pap smear (hesitant to do this) contributory, and patient agreed.  No signs or symptoms of hypertensive urgency or emergency at this time.   BP unchanged when patient seen a cardiologist office back in May of this year.  She is NOT on amlodipine at this time as she reports blood pressure readings at home are well controlled.  Advised to keep a log and to send me 2 weeks worth of this so we can make a sound decision as to whether or not she should be on amlodipine.

## 2018-06-30 NOTE — Assessment & Plan Note (Signed)
Clinical breast exam performed.  Mammogram is already scheduled.  Pap smear performed.

## 2018-06-30 NOTE — Assessment & Plan Note (Addendum)
Chronic. 3 self limiting episodes. Reassured by normal neurologic exam.  Unable to elicit nystagmus, vertigo during Dix-Hallpike maneuver.  Patient appears mildly orthostatic based on diastolic blood pressure. Pending MRI brain. Will confirm no metal ( Dr Elvina Mattes, podiatry).  Education provided.  Referral to ophthalmology due to flashing light symptoms.

## 2018-06-30 NOTE — Patient Instructions (Addendum)
Please call Dr Aurora Mask office and CONFIRM when your repeat colonoscopy is. I have it is due in 2022.   Please ensure you keep your mammogram appointment next month.   You do appear to have mildly low blood pressure when you change positions.  Please ensure you are adequately hydrated.  Also want you to make sure that you are spot checking her blood pressure as it is elevated today.  Based on blood pressure readings today, I would advise for you to take the amlodipine however it appears that home blood pressure are much better controlled.  Please send me a log via my chart of blood pressures over the next couple weeks.  Youmay take blood pressure 2-3 times per week and send me these.  Make sure that you are adequately resting for about 10 minutes prior to taking blood pressure.  If persistently greater than 130/80.  I would advise we restart the amlodipine.  Please let me know if your tremor or flashing lights, vertigo, also as we discussed the day were to change, become more frequent, or new symptoms present.  Today we discussed referrals, orders.  Ophthalmology, dermatology, MRI brain.   I have placed these orders in the system for you.  Please be sure to give [place] a call if you have not heard from our office regarding scheduling a test or regarding referral in a timely manner.  It is very important that you let me know as soon as possible.    Health Maintenance for Postmenopausal Women Menopause is a normal process in which your reproductive ability comes to an end. This process happens gradually over a span of months to years, usually between the ages of 24 and 64. Menopause is complete when you have missed 12 consecutive menstrual periods. It is important to talk with your health care provider about some of the most common conditions that affect postmenopausal women, such as heart disease, cancer, and bone loss (osteoporosis). Adopting a healthy lifestyle and getting preventive care can help to  promote your health and wellness. Those actions can also lower your chances of developing some of these common conditions. What should I know about menopause? During menopause, you may experience a number of symptoms, such as:  Moderate-to-severe hot flashes.  Night sweats.  Decrease in sex drive.  Mood swings.  Headaches.  Tiredness.  Irritability.  Memory problems.  Insomnia.  Choosing to treat or not to treat menopausal changes is an individual decision that you make with your health care provider. What should I know about hormone replacement therapy and supplements? Hormone therapy products are effective for treating symptoms that are associated with menopause, such as hot flashes and night sweats. Hormone replacement carries certain risks, especially as you become older. If you are thinking about using estrogen or estrogen with progestin treatments, discuss the benefits and risks with your health care provider. What should I know about heart disease and stroke? Heart disease, heart attack, and stroke become more likely as you age. This may be due, in part, to the hormonal changes that your body experiences during menopause. These can affect how your body processes dietary fats, triglycerides, and cholesterol. Heart attack and stroke are both medical emergencies. There are many things that you can do to help prevent heart disease and stroke:  Have your blood pressure checked at least every 1-2 years. High blood pressure causes heart disease and increases the risk of stroke.  If you are 65-49 years old, ask your health care provider  if you should take aspirin to prevent a heart attack or a stroke.  Do not use any tobacco products, including cigarettes, chewing tobacco, or electronic cigarettes. If you need help quitting, ask your health care provider.  It is important to eat a healthy diet and maintain a healthy weight. ? Be sure to include plenty of vegetables, fruits, low-fat  dairy products, and lean protein. ? Avoid eating foods that are high in solid fats, added sugars, or salt (sodium).  Get regular exercise. This is one of the most important things that you can do for your health. ? Try to exercise for at least 150 minutes each week. The type of exercise that you do should increase your heart rate and make you sweat. This is known as moderate-intensity exercise. ? Try to do strengthening exercises at least twice each week. Do these in addition to the moderate-intensity exercise.  Know your numbers.Ask your health care provider to check your cholesterol and your blood glucose. Continue to have your blood tested as directed by your health care provider.  What should I know about cancer screening? There are several types of cancer. Take the following steps to reduce your risk and to catch any cancer development as early as possible. Breast Cancer  Practice breast self-awareness. ? This means understanding how your breasts normally appear and feel. ? It also means doing regular breast self-exams. Let your health care provider know about any changes, no matter how small.  If you are 54 or older, have a clinician do a breast exam (clinical breast exam or CBE) every year. Depending on your age, family history, and medical history, it may be recommended that you also have a yearly breast X-ray (mammogram).  If you have a family history of breast cancer, talk with your health care provider about genetic screening.  If you are at high risk for breast cancer, talk with your health care provider about having an MRI and a mammogram every year.  Breast cancer (BRCA) gene test is recommended for women who have family members with BRCA-related cancers. Results of the assessment will determine the need for genetic counseling and BRCA1 and for BRCA2 testing. BRCA-related cancers include these types: ? Breast. This occurs in males or females. ? Ovarian. ? Tubal. This may also  be called fallopian tube cancer. ? Cancer of the abdominal or pelvic lining (peritoneal cancer). ? Prostate. ? Pancreatic.  Cervical, Uterine, and Ovarian Cancer Your health care provider may recommend that you be screened regularly for cancer of the pelvic organs. These include your ovaries, uterus, and vagina. This screening involves a pelvic exam, which includes checking for microscopic changes to the surface of your cervix (Pap test).  For women ages 21-65, health care providers may recommend a pelvic exam and a Pap test every three years. For women ages 10-65, they may recommend the Pap test and pelvic exam, combined with testing for human papilloma virus (HPV), every five years. Some types of HPV increase your risk of cervical cancer. Testing for HPV may also be done on women of any age who have unclear Pap test results.  Other health care providers may not recommend any screening for nonpregnant women who are considered low risk for pelvic cancer and have no symptoms. Ask your health care provider if a screening pelvic exam is right for you.  If you have had past treatment for cervical cancer or a condition that could lead to cancer, you need Pap tests and screening for cancer  for at least 20 years after your treatment. If Pap tests have been discontinued for you, your risk factors (such as having a new sexual partner) need to be reassessed to determine if you should start having screenings again. Some women have medical problems that increase the chance of getting cervical cancer. In these cases, your health care provider may recommend that you have screening and Pap tests more often.  If you have a family history of uterine cancer or ovarian cancer, talk with your health care provider about genetic screening.  If you have vaginal bleeding after reaching menopause, tell your health care provider.  There are currently no reliable tests available to screen for ovarian cancer.  Lung  Cancer Lung cancer screening is recommended for adults 110-72 years old who are at high risk for lung cancer because of a history of smoking. A yearly low-dose CT scan of the lungs is recommended if you:  Currently smoke.  Have a history of at least 30 pack-years of smoking and you currently smoke or have quit within the past 15 years. A pack-year is smoking an average of one pack of cigarettes per day for one year.  Yearly screening should:  Continue until it has been 15 years since you quit.  Stop if you develop a health problem that would prevent you from having lung cancer treatment.  Colorectal Cancer  This type of cancer can be detected and can often be prevented.  Routine colorectal cancer screening usually begins at age 44 and continues through age 38.  If you have risk factors for colon cancer, your health care provider may recommend that you be screened at an earlier age.  If you have a family history of colorectal cancer, talk with your health care provider about genetic screening.  Your health care provider may also recommend using home test kits to check for hidden blood in your stool.  A small camera at the end of a tube can be used to examine your colon directly (sigmoidoscopy or colonoscopy). This is done to check for the earliest forms of colorectal cancer.  Direct examination of the colon should be repeated every 5-10 years until age 76. However, if early forms of precancerous polyps or small growths are found or if you have a family history or genetic risk for colorectal cancer, you may need to be screened more often.  Skin Cancer  Check your skin from head to toe regularly.  Monitor any moles. Be sure to tell your health care provider: ? About any new moles or changes in moles, especially if there is a change in a mole's shape or color. ? If you have a mole that is larger than the size of a pencil eraser.  If any of your family members has a history of skin  cancer, especially at a young age, talk with your health care provider about genetic screening.  Always use sunscreen. Apply sunscreen liberally and repeatedly throughout the day.  Whenever you are outside, protect yourself by wearing long sleeves, pants, a wide-brimmed hat, and sunglasses.  What should I know about osteoporosis? Osteoporosis is a condition in which bone destruction happens more quickly than new bone creation. After menopause, you may be at an increased risk for osteoporosis. To help prevent osteoporosis or the bone fractures that can happen because of osteoporosis, the following is recommended:  If you are 24-26 years old, get at least 1,000 mg of calcium and at least 600 mg of vitamin D per day.  If you are older than age 32 but younger than age 66, get at least 1,200 mg of calcium and at least 600 mg of vitamin D per day.  If you are older than age 37, get at least 1,200 mg of calcium and at least 800 mg of vitamin D per day.  Smoking and excessive alcohol intake increase the risk of osteoporosis. Eat foods that are rich in calcium and vitamin D, and do weight-bearing exercises several times each week as directed by your health care provider. What should I know about how menopause affects my mental health? Depression may occur at any age, but it is more common as you become older. Common symptoms of depression include:  Low or sad mood.  Changes in sleep patterns.  Changes in appetite or eating patterns.  Feeling an overall lack of motivation or enjoyment of activities that you previously enjoyed.  Frequent crying spells.  Talk with your health care provider if you think that you are experiencing depression. What should I know about immunizations? It is important that you get and maintain your immunizations. These include:  Tetanus, diphtheria, and pertussis (Tdap) booster vaccine.  Influenza every year before the flu season begins.  Pneumonia  vaccine.  Shingles vaccine.  Your health care provider may also recommend other immunizations. This information is not intended to replace advice given to you by your health care provider. Make sure you discuss any questions you have with your health care provider. Document Released: 11/21/2005 Document Revised: 04/18/2016 Document Reviewed: 07/03/2015 Elsevier Interactive Patient Education  2018 [employer].

## 2018-06-30 NOTE — Assessment & Plan Note (Addendum)
Stable on current regimen. No changes today. Will discuss trazodone at follow up.

## 2018-06-30 NOTE — Progress Notes (Signed)
Subjective:    [name], female    DOB: [birth year], 67 y.o.   MRN: [id number]  CC: Wanda Clayton is a 67 y.o. female who presents today for physical exam.    HPI: HTN- Not taking amlodipine. At home , gets 121 SBP per patient. Cannot recall DBP.  Denies exertional chest pain or pressure, numbness or tingling radiating to left arm or jaw, palpitations,frequent headaches, changes in vision, or shortness of breath.   Vertigo- 3 episodes in past year. Worse is when laying down in dentist chair.  Episode of vertigo last night with room spinning after had been standing up and then laid down. No congestion. Chronic hearing loss.   Last week had an episodes of flashing lights in vision psychodelic lights' for years. Lasts for a couple minutes, then resolves on it own. This occurs every 3-4 months. Has seen Dr Gloriann Loan regarding this and 'told it was a migraine.' Sometimes has a headache when the light starts. HA is mild. Feels sharp, electrical shoot over left ear. No vomiting, N, syncope. Glass of wine resolves HA. After hours of playing on the computer, noticed that will see flashing lights. No falls, balance problems. Hasnt tried any medication for this.   GAD- on prozac. No depression. Anxiety has improved.    Doesn't snore. Trouble staying asleep. Feels refreshed.   Tremor- unchanged. Seen in both hands. Notices when holding coffee mug. Worsened with coffee, 'depends on how much I drink' , and when nervous.     [facility] - 02/2018 150/88- no changes to HTN medications. Declined stress test   Colorectal Cancer Screening: UTD, 2012 Breast Cancer Screening: Mammogram due next month; scheduled.  Cervical Cancer Screening: Due Bone Health screening/DEXA for 65+: done 2018; doesn't meet guidelines for starting medication based on FRAX Lung Cancer Screening:Due for this year; done in 2018 Immunizations       Tetanus - utd        Pneumococcal - Candidate for. Declines.  Labs:  Screening labs today. Exercise: No regular exercise. 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  Alcohol use: daily one to two glasses of wine Smoking/tobacco use: Nonsmoker.  Wears seat belt: Yes. Skin: no new skin lesions; no h/o skin cancer.   HISTORY:  Past Medical History:  Diagnosis Date  . Anxiety   . Depression     Past Surgical History:  Procedure Laterality Date  . carpal tunnel repair    . DENTAL SURGERY    . TUBAL LIGATION    . VAGINAL DELIVERY     x1   Family History  Problem Relation Age of Onset  . Hypertension Mother   . Heart disease Father   . Diabetes Father   . COPD Sister   . Heart disease Brother   . Crohn's disease Sister   . Breast cancer Neg Hx       ALLERGIES: Pollen extract  Current Outpatient Medications on File Prior to Visit  Medication Sig Dispense Refill  . atorvastatin (LIPITOR) 40 MG tablet TAKE 2 TABLETS (80 MG TOTAL) BY MOUTH DAILY AT 6 PM. 120 tablet 0  . cholecalciferol (VITAMIN D) 1000 UNITS tablet Take 1,000 Units by mouth daily.    Marland Kitchen ezetimibe (ZETIA) 10 MG tablet Take 1 tablet (10 mg total) by mouth daily. 90 tablet 3  . FLUoxetine (PROZAC) 40 MG capsule TAKE 1 CAPSULE (40 MG TOTAL) BY MOUTH DAILY. 90 capsule 1  . Multiple Vitamins-Minerals (MULTIVITAMIN WITH MINERALS) tablet Take  1 tablet by mouth daily.    Marland Kitchen amLODipine (NORVASC) 2.5 MG tablet Take 1 tablet (2.5 mg total) by mouth daily. (Patient not taking: Reported on 06/30/2018) 90 tablet 3   No current facility-administered medications on file prior to visit.     Social History   Tobacco Use  . Smoking status: Current Every Day Smoker    Packs/day: 1.00    Years: 35.00    Pack years: 35.00    Types: Cigarettes  . Smokeless tobacco: Never Used  . Tobacco comment: she plans to quit without medication  Substance Use Topics  . Alcohol use: Yes    Comment: Daily glass wine x2  . Drug use: No    Review of Systems  Constitutional: Negative for chills and  fever.  Eyes: Positive for visual disturbance. Negative for photophobia and redness.  Respiratory: Negative for cough.   Cardiovascular: Negative for chest pain and palpitations.  Gastrointestinal: Negative for nausea and vomiting.  Genitourinary: Negative for dyspareunia, hematuria and vaginal bleeding.  Neurological: Positive for dizziness ('vertigo'), tremors and headaches. Negative for syncope.      Objective:    BP (!) 162/72 (BP Location: Left Arm)   Pulse 84   Temp 98.5 F (36.9 C) (Oral)   Resp 15   Ht 5' 4.75" (1.645 m)   Wt 143 lb 4 oz (65 kg)   SpO2 98%   BMI 24.02 kg/m   BP Readings from Last 3 Encounters:  06/30/18 (!) 162/72  02/22/18 (!) 150/88  01/06/18 (!) 170/80   Wt Readings from Last 3 Encounters:  06/30/18 143 lb 4 oz (65 kg)  02/22/18 142 lb 8 oz (64.6 kg)  01/06/18 143 lb 6 oz (65 kg)    Physical Exam  Constitutional: Vital signs are normal. She appears well-developed and well-nourished.  HENT:  Head: Normocephalic and atraumatic.  Right Ear: Hearing, tympanic membrane, external ear and ear canal normal. No swelling or tenderness. Tympanic membrane is not erythematous and not bulging. No middle ear effusion.  Left Ear: Hearing, tympanic membrane, external ear and ear canal normal. No swelling or tenderness. Tympanic membrane is not erythematous and not bulging.  No middle ear effusion.  Nose: No rhinorrhea. Right sinus exhibits no maxillary sinus tenderness and no frontal sinus tenderness. Left sinus exhibits no maxillary sinus tenderness and no frontal sinus tenderness.  Mouth/Throat: Uvula is midline, oropharynx is clear and moist and mucous membranes are normal. No uvula swelling. No posterior oropharyngeal edema or posterior oropharyngeal erythema.  Eyes: Pupils are equal, round, and reactive to light. Conjunctivae, EOM and lids are normal. Lids are everted and swept, no foreign bodies found.  Neck: No thyroid mass and no thyromegaly present.    Cardiovascular: Normal rate, regular rhythm, normal heart sounds and normal pulses.  Pulmonary/Chest: Effort normal and breath sounds normal. She has no wheezes. She has no rhonchi. She has no rales. Right breast exhibits no inverted nipple, no mass, no nipple discharge, no skin change and no tenderness. Left breast exhibits no inverted nipple, no mass, no nipple discharge, no skin change and no tenderness. Breasts are symmetrical.  No masses or asymmetry appreciated during CBE.  Genitourinary: Uterus is not enlarged, not fixed and not tender. Cervix exhibits no motion tenderness, no discharge and no friability. Right adnexum displays no mass, no tenderness and no fullness. Left adnexum displays no mass, no tenderness and no fullness.  Genitourinary Comments: Pap performed. No CMT. Unable to appreciated ovaries.  Lymphadenopathy:  Head (right side): No submental, no submandibular, no tonsillar, no preauricular, no posterior auricular and no occipital adenopathy present.       Head (left side): No submental, no submandibular, no tonsillar, no preauricular, no posterior auricular and no occipital adenopathy present.    She has no cervical adenopathy.       Right cervical: No superficial cervical, no deep cervical and no posterior cervical adenopathy present.      Left cervical: No superficial cervical, no deep cervical and no posterior cervical adenopathy present.    She has no axillary adenopathy.       Right axillary: No pectoral and no lateral adenopathy present.       Left axillary: No pectoral and no lateral adenopathy present. Neurological: She is alert. She has normal strength. No cranial nerve deficit or sensory deficit. She displays a negative Romberg sign.  Reflex Scores:      Bicep reflexes are 2+ on the right side and 2+ on the left side.      Patellar reflexes are 2+ on the right side and 2+ on the left side. Grip equal and strong bilateral upper extremities. Gait strong and  steady. No cogwheeling. Able to perform finger-to-nose without difficulty.  Fine resting tremor when arms extended noticed in hands. Improves with intention.  Dix hall pike maneuver did not elicit vertigo. No nystagmus noted.     Skin: Skin is warm and dry.  Psychiatric: She has a normal mood and affect. Her speech is normal and behavior is normal. Thought content normal.  Vitals reviewed.      Assessment & Plan:   Problem List Items Addressed This Visit      Cardiovascular and Mediastinum   HTN (hypertension)    Elevated today however dramatically improved after resting in exam room.  Do suspect some degree of anxiety regarding Pap smear (hesitant to do this) contributory, and patient agreed.  No signs or symptoms of hypertensive urgency or emergency at this time.   BP unchanged when patient seen a cardiologist office back in May of this year.  She is NOT on amlodipine at this time as she reports blood pressure readings at home are well controlled.  Advised to keep a log and to send me 2 weeks worth of this so we can make a sound decision as to whether or not she should be on amlodipine.        Other   Depression with anxiety    Stable on current regimen. No changes today. Will discuss trazodone at follow up.       Routine general medical examination at a health care facility - Primary    Clinical breast exam performed.  Mammogram is already scheduled.  Pap smear performed.      Relevant Orders   Ambulatory referral to Ophthalmology   MR Brain W Wo Contrast   Ambulatory referral to Dermatology   CBC with Differential/Platelet   Comprehensive metabolic panel   Hemoglobin A1c   Lipid panel   Cytology - PAP   TSH   VITAMIN D 25 Hydroxy (Vit-D Deficiency, Fractures)   Tremor    Chronic, not worsened.  She is able to correlate that caffeine, anxiety worsens.  No cogwheeling.  Politely declines consult with neurology today.  Will follow      Vertigo     Chronic. 3 self  limiting episodes. Reassured by normal neurologic exam.  Unable to elicit nystagmus, vertigo during Dix-Hallpike maneuver.  Patient appears mildly orthostatic based  on diastolic blood pressure. Pending MRI brain. Will confirm no metal ( Dr Elvina Mattes, podiatry).  Education provided.  Referral to ophthalmology due to flashing light symptoms.        Relevant Orders   Ambulatory referral to Ophthalmology   MR Brain W Wo Contrast    Other Visit Diagnoses    Chronic nonintractable headache, unspecified headache type       Relevant Orders   Ambulatory referral to Ophthalmology   MR Brain W Wo Contrast       I am having Lavonn W. Tsosie maintain her cholecalciferol, multivitamin with minerals, amLODipine, FLUoxetine, ezetimibe, and atorvastatin.   No orders of the defined types were placed in this encounter.   Return precautions given.   Risks, benefits, and alternatives of the medications and treatment plan prescribed today were discussed, and patient expressed understanding.   Education regarding symptom management and diagnosis given to patient on AVS.   Continue to follow with Burnard Hawthorne, FNP for routine health maintenance.   Wanda Clayton and I agreed with plan.   [name], FNP

## 2018-06-30 NOTE — Assessment & Plan Note (Signed)
Chronic, not worsened.  She is able to correlate that caffeine, anxiety worsens.  No cogwheeling.  Politely declines consult with neurology today.  Will follow

## 2018-07-01 LAB — CYTOLOGY - PAP
Diagnosis: NEGATIVE
HPV (WINDOPATH): DETECTED — AB

## 2018-07-01 NOTE — Progress Notes (Signed)
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 He stated that radiology should be aware of this , shouldn't effect patient having MRI.

## 2018-07-03 ENCOUNTER — Ambulatory Visit: Payer: PPO

## 2018-07-03 ENCOUNTER — Telehealth: Payer: Self-pay

## 2018-07-03 NOTE — Telephone Encounter (Signed)
Call pt regarding lung screening. PT is a current smoker, smoking about 1 pack per day.Pt would like scan in the morning on Oct. 23rd. Pt now have HPB.

## 2018-07-05 ENCOUNTER — Other Ambulatory Visit: Payer: Self-pay | Admitting: *Deleted

## 2018-07-05 ENCOUNTER — Encounter: Payer: Self-pay | Admitting: *Deleted

## 2018-07-05 DIAGNOSIS — Z122 Encounter for screening for malignant neoplasm of respiratory organs: Secondary | ICD-10-CM

## 2018-07-17 ENCOUNTER — Ambulatory Visit
Admission: RE | Admit: 2018-07-17 | Discharge: 2018-07-17 | Disposition: A | Discharge status: Home / Self Care | Payer: PPO | Source: Ambulatory Visit | Attending: Family | Admitting: Family

## 2018-07-17 DIAGNOSIS — R9082 White matter disease, unspecified: Secondary | ICD-10-CM | POA: Insufficient documentation

## 2018-07-17 DIAGNOSIS — Z0000 Encounter for general adult medical examination without abnormal findings: Secondary | ICD-10-CM

## 2018-07-17 DIAGNOSIS — R51 Headache: Secondary | ICD-10-CM | POA: Diagnosis not present

## 2018-07-17 DIAGNOSIS — G9389 Other specified disorders of brain: Secondary | ICD-10-CM | POA: Diagnosis not present

## 2018-07-17 DIAGNOSIS — R42 Dizziness and giddiness: Secondary | ICD-10-CM | POA: Diagnosis not present

## 2018-07-17 DIAGNOSIS — G8929 Other chronic pain: Secondary | ICD-10-CM

## 2018-07-17 MED ORDER — GADOBUTROL 1 MMOL/ML IV SOLN
6.0000 mL | Freq: Once | INTRAVENOUS | Status: AC | PRN
  Administered 2018-07-17: 6 mL via INTRAVENOUS

## 2018-07-19 ENCOUNTER — Telehealth: Payer: Self-pay | Admitting: Family

## 2018-07-19 ENCOUNTER — Encounter: Payer: Self-pay | Admitting: Family

## 2018-07-19 DIAGNOSIS — R87619 Unspecified abnormal cytological findings in specimens from cervix uteri: Secondary | ICD-10-CM

## 2018-07-19 NOTE — Telephone Encounter (Signed)
Looks like referral was put in today. Please advise

## 2018-07-19 NOTE — Telephone Encounter (Signed)
I do not see a referral for GYN. Please advise. Thanks! Melissa

## 2018-07-19 NOTE — Telephone Encounter (Signed)
I have placed referral to gyn now  Thanks melissa  Please let me know once scheduled

## 2018-07-19 NOTE — Telephone Encounter (Signed)
Copied from [place] [id number]. Topic: Referral - Request >> Jul 19, 2018 11:08 AM Burchel, Abbi R wrote: Reason for CRM:   Pt states [name] mentioned a referral to a GYN re: abnormal PAP. Pt states she has not been contacted to schedule. Please let pt know if this referral is still necessary.  Pt: [phone]

## 2018-07-21 NOTE — Telephone Encounter (Signed)
Left voice mail for patient to call back ok for PEC to speak to patient    

## 2018-07-22 ENCOUNTER — Encounter: Payer: Self-pay | Admitting: Family

## 2018-07-22 NOTE — Telephone Encounter (Signed)
mychart message sent

## 2018-07-23 ENCOUNTER — Encounter: Payer: Self-pay | Admitting: Family

## 2018-07-28 DIAGNOSIS — G43109 Migraine with aura, not intractable, without status migrainosus: Secondary | ICD-10-CM | POA: Diagnosis not present

## 2018-08-02 ENCOUNTER — Ambulatory Visit
Admission: RE | Admit: 2018-08-02 | Discharge: 2018-08-02 | Disposition: A | Discharge status: Home / Self Care | Payer: PPO | Source: Ambulatory Visit | Attending: Family | Admitting: Family

## 2018-08-02 DIAGNOSIS — Z1231 Encounter for screening mammogram for malignant neoplasm of breast: Secondary | ICD-10-CM | POA: Diagnosis not present

## 2018-08-04 ENCOUNTER — Ambulatory Visit
Admission: RE | Admit: 2018-08-04 | Discharge: 2018-08-04 | Disposition: A | Discharge status: Home / Self Care | Payer: PPO | Source: Ambulatory Visit | Attending: Oncology | Admitting: Oncology

## 2018-08-04 DIAGNOSIS — J432 Centrilobular emphysema: Secondary | ICD-10-CM | POA: Diagnosis not present

## 2018-08-04 DIAGNOSIS — I2510 Atherosclerotic heart disease of native coronary artery without angina pectoris: Secondary | ICD-10-CM | POA: Insufficient documentation

## 2018-08-04 DIAGNOSIS — D3502 Benign neoplasm of left adrenal gland: Secondary | ICD-10-CM | POA: Diagnosis not present

## 2018-08-04 DIAGNOSIS — F17210 Nicotine dependence, cigarettes, uncomplicated: Secondary | ICD-10-CM | POA: Insufficient documentation

## 2018-08-04 DIAGNOSIS — J438 Other emphysema: Secondary | ICD-10-CM | POA: Insufficient documentation

## 2018-08-04 DIAGNOSIS — I700 Atherosclerosis of aorta: Secondary | ICD-10-CM | POA: Insufficient documentation

## 2018-08-04 DIAGNOSIS — Z122 Encounter for screening for malignant neoplasm of respiratory organs: Secondary | ICD-10-CM | POA: Insufficient documentation

## 2018-08-05 ENCOUNTER — Encounter: Payer: Self-pay | Admitting: *Deleted

## 2018-08-05 ENCOUNTER — Other Ambulatory Visit (HOSPITAL_COMMUNITY)
Admission: RE | Admit: 2018-08-05 | Discharge: 2018-08-05 | Disposition: A | Discharge status: Home / Self Care | Payer: PPO | Source: Ambulatory Visit | Attending: Obstetrics and Gynecology | Admitting: Obstetrics and Gynecology

## 2018-08-05 ENCOUNTER — Other Ambulatory Visit: Payer: Self-pay

## 2018-08-05 ENCOUNTER — Ambulatory Visit (INDEPENDENT_AMBULATORY_CARE_PROVIDER_SITE_OTHER): Payer: PPO | Admitting: Obstetrics and Gynecology

## 2018-08-05 ENCOUNTER — Encounter: Payer: Self-pay | Admitting: Obstetrics and Gynecology

## 2018-08-05 VITALS — BP 140/66 | HR 95 | Ht 65.0 in | Wt 140.0 lb

## 2018-08-05 DIAGNOSIS — R87618 Other abnormal cytological findings on specimens from cervix uteri: Secondary | ICD-10-CM

## 2018-08-05 DIAGNOSIS — R87810 Cervical high risk human papillomavirus (HPV) DNA test positive: Secondary | ICD-10-CM | POA: Diagnosis not present

## 2018-08-05 DIAGNOSIS — R8789 Other abnormal findings in specimens from female genital organs: Secondary | ICD-10-CM

## 2018-08-05 DIAGNOSIS — N888 Other specified noninflammatory disorders of cervix uteri: Secondary | ICD-10-CM | POA: Diagnosis not present

## 2018-08-05 NOTE — Progress Notes (Signed)
   GYNECOLOGY CLINIC COLPOSCOPY PROCEDURE NOTE  67 y.o. 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 Discussed underlying role for HPV infection in the development of cervical dysplasia, its natural history and progression/regression, need for surveillance.  Is the patient  pregnant: No LMP: No LMP recorded. Patient is postmenopausal. Smoking status:  reports that she has been smoking cigarettes. She has a 36.00 pack-year smoking history. She has never used smokeless tobacco. Contraception: none Future fertility desired:  No  Patient given informed consent, signed copy in the chart, time out was performed.  The patient was position in dorsal lithotomy position. Speculum was placed the cervix was visualized.   After application of acetic acid colposcopic inspection of the cervix was undertaken.   Colposcopy adequate, full visualization of transformation zone: No no visible lesions;biopsy performed at 3 o'clock. Small endocervical polyp.  obtained.   ECC specimen obtained:  Yes  All specimens were labeled and sent to pathology.   Patient was given post procedure instructions.  Will follow up pathology and manage accordingly.  Routine preventative health maintenance measures emphasized.  Physical Exam  Genitourinary:      Adrian Prows MD [facility], [facility] 08/05/18 9:23 AM

## 2018-08-05 NOTE — Patient Instructions (Signed)
COLPOSCOPY POST-PROCEDURE INSTRUCTIONS  1. You may take Ibuprofen, Aleve or Tylenol for cramping if needed.  2. If Monsel's solution was used, you will have a black discharge.  3. Light bleeding is normal.  If bleeding is heavier than your period, please call.  4. Put nothing in your vagina until the bleeding or discharge stops (usually 2 or 3 days).  5. We will call you within one week with biopsy results or discuss the results at your follow-up appointment if needed.  

## 2018-08-06 NOTE — Progress Notes (Signed)
Normal, repeat pap smear in 1 year, discussed on phone, released to mychart.

## 2018-08-17 ENCOUNTER — Other Ambulatory Visit: Payer: Self-pay | Admitting: Family

## 2018-08-18 ENCOUNTER — Other Ambulatory Visit: Payer: Self-pay | Admitting: Family

## 2018-08-18 DIAGNOSIS — E785 Hyperlipidemia, unspecified: Secondary | ICD-10-CM

## 2018-08-23 ENCOUNTER — Telehealth: Payer: Self-pay | Admitting: Family

## 2018-08-23 ENCOUNTER — Encounter: Payer: Self-pay | Admitting: Family

## 2018-08-23 NOTE — Telephone Encounter (Signed)
Sent mychart

## 2018-08-24 ENCOUNTER — Encounter: Payer: Self-pay | Admitting: Family

## 2018-09-01 ENCOUNTER — Encounter: Payer: Self-pay | Admitting: Family

## 2018-09-01 ENCOUNTER — Ambulatory Visit (INDEPENDENT_AMBULATORY_CARE_PROVIDER_SITE_OTHER): Payer: PPO | Admitting: Family

## 2018-09-01 ENCOUNTER — Ambulatory Visit: Payer: PPO | Admitting: Family

## 2018-09-01 VITALS — BP 128/70 | HR 93 | Temp 98.7°F | Resp 15 | Ht 64.75 in | Wt 140.8 lb

## 2018-09-01 DIAGNOSIS — R42 Dizziness and giddiness: Secondary | ICD-10-CM | POA: Diagnosis not present

## 2018-09-01 DIAGNOSIS — I10 Essential (primary) hypertension: Secondary | ICD-10-CM

## 2018-09-01 DIAGNOSIS — Z800 Family history of malignant neoplasm of digestive organs: Secondary | ICD-10-CM | POA: Diagnosis not present

## 2018-09-01 DIAGNOSIS — E785 Hyperlipidemia, unspecified: Secondary | ICD-10-CM

## 2018-09-01 DIAGNOSIS — I2510 Atherosclerotic heart disease of native coronary artery without angina pectoris: Secondary | ICD-10-CM

## 2018-09-01 DIAGNOSIS — E782 Mixed hyperlipidemia: Secondary | ICD-10-CM

## 2018-09-01 DIAGNOSIS — F418 Other specified anxiety disorders: Secondary | ICD-10-CM | POA: Diagnosis not present

## 2018-09-01 MED ORDER — FLUOXETINE HCL 10 MG PO CAPS: 10.0000 mg | ORAL_CAPSULE | Freq: Every day | ORAL | 3 refills | Status: DC

## 2018-09-01 MED ORDER — ATORVASTATIN CALCIUM 40 MG PO TABS: 40.0000 mg | ORAL_TABLET | Freq: Every day | ORAL | 1 refills | Status: DC

## 2018-09-01 NOTE — Assessment & Plan Note (Signed)
Likely due for colonoscopy. Referral placed

## 2018-09-01 NOTE — Assessment & Plan Note (Addendum)
LDL 67 06/2018. Continue zetia, lipitor 40mg 

## 2018-09-01 NOTE — Assessment & Plan Note (Signed)
LDL at goal. No CP. Advised to continue lipitor 40mg , zetia. She will follow up with gollan in 2020.

## 2018-09-01 NOTE — Assessment & Plan Note (Signed)
At goal. No medication at this time. Will follow

## 2018-09-01 NOTE — Progress Notes (Signed)
Subjective:    [name], female    DOB: 1950-10-29, 67 y.o.   MRN: [id number]  CC: [name] is a 67 y.o. female who presents today for follow up.   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 Some days feeks down. On prozac 40mg . Trouble sleeping through the night which thinks might be anxiety. No si/hi.    CT chest- would like to review. Continues to smoke  HTN- Not on amlodipine. has been keeping log at home SBP 121. Cannot recall DBP. Denies exertional chest pain or pressure, numbness or tingling radiating to left arm or jaw, palpitations, dizziness, or shortness of breath.    HA- unchanged for years. Occurs once per week, lasts couple of hours. 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 'Ha's are not severe.' Doesn't take medication for HA. Not worsened in intensity or severity. No dimming of vision. No numbness to face, confusion.   Vertigo -Unchanged. Not increasing in frequency.  one episode since OV.  06/2018. Seen ophthalmologist and normal exam per patient; told that 'flashing lights are part of migraine'   HLD- taking lipitor 40mg ; on zetia from Dr Rockey Situ however notes expensive and wanders if she still needs the [facility] 02/2018.  F/u 12 months.   Colonoscopy UTD 7 years ago.  HISTORY:  Past Medical History:  Diagnosis Date  . Anxiety   . Depression    Past Surgical History:  Procedure Laterality Date  . carpal tunnel repair    . DENTAL SURGERY    . TUBAL LIGATION    . VAGINAL DELIVERY     x1   Family History  Problem Relation Age of Onset  . Hypertension Mother   . Colon cancer Mother 69  . Heart disease Father   . Diabetes Father   . Colon cancer Father 24  . COPD Sister   . Crohn's disease Sister   . Colon cancer Sister 29  . Heart disease Brother   . Heart attack Sister   . Breast cancer Neg Hx     Allergies: Pollen extract Current  Outpatient Medications on File Prior to Visit  Medication Sig Dispense Refill  . aspirin 81 MG tablet Take 81 mg by mouth daily.    . cholecalciferol (VITAMIN D) 1000 UNITS tablet Take 1,000 Units by mouth daily.    Marland Kitchen ezetimibe (ZETIA) 10 MG tablet Take 1 tablet (10 mg total) by mouth daily. 90 tablet 3  . FLUoxetine (PROZAC) 40 MG capsule TAKE 1 CAPSULE (40 MG TOTAL) BY MOUTH DAILY. 90 capsule 0  . Multiple Vitamins-Minerals (MULTIVITAMIN WITH MINERALS) tablet Take 1 tablet by mouth daily.     No current facility-administered medications on file prior to visit.     Social History   Tobacco Use  . Smoking status: Current Every Day Smoker    Packs/day: 1.00    Years: 36.00    Pack years: 36.00    Types: Cigarettes  . Smokeless tobacco: Never Used  . Tobacco comment: she plans to quit without medication  Substance Use Topics  . Alcohol use: Yes    Comment: Daily glass wine x2  . Drug use: No    Review of Systems  Constitutional: Negative for chills and fever.  Eyes: Positive for visual disturbance ('flashing lights').  Respiratory: Negative for cough.   Cardiovascular: Negative for chest pain and palpitations.  Gastrointestinal:  Negative for nausea and vomiting.  Neurological: Positive for headaches. Negative for dizziness.  Psychiatric/Behavioral: Positive for sleep disturbance. Negative for suicidal ideas. The patient is nervous/anxious.       Objective:    BP 128/70   Pulse 93   Temp 98.7 F (37.1 C) (Oral)   Resp 15   Ht 5' 4.75" (1.645 m)   Wt 140 lb 12 oz (63.8 kg)   SpO2 98%   BMI 23.60 kg/m  BP Readings from Last 3 Encounters:  09/01/18 128/70  08/05/18 140/66  06/30/18 (!) 162/72   Wt Readings from Last 3 Encounters:  09/01/18 140 lb 12 oz (63.8 kg)  08/05/18 140 lb (63.5 kg)  08/04/18 142 lb (64.4 kg)    Physical Exam  Constitutional: She appears well-developed and well-nourished.  Eyes: Conjunctivae are normal.  Cardiovascular: Normal rate,  regular rhythm, normal heart sounds and normal pulses.  Pulmonary/Chest: Effort normal and breath sounds normal. She has no wheezes. She has no rhonchi. She has no rales.  Neurological: She is alert.  Skin: Skin is warm and dry.  Psychiatric: She has a normal mood and affect. Her speech is normal and behavior is normal. Thought content normal.  Vitals reviewed.      Assessment & Plan:   Problem List Items Addressed This Visit      Cardiovascular and Mediastinum   HTN (hypertension) - Primary    At goal. No medication at this time. Will follow      Relevant Medications   aspirin 81 MG tablet   atorvastatin (LIPITOR) 40 MG tablet   Coronary artery calcification seen on CT scan    LDL at goal. No CP. Advised to continue lipitor 40mg , zetia. She will follow up with gollan in 2020.       Relevant Medications   aspirin 81 MG tablet   atorvastatin (LIPITOR) 40 MG tablet     Other   Depression with anxiety    Increase prozac to 50mg . She will let me know if symptoms are not improved.       Relevant Medications   FLUoxetine (PROZAC) 10 MG capsule   Hyperlipidemia    LDL 67 06/2018. Continue zetia, lipitor 40mg       Relevant Medications   aspirin 81 MG tablet   atorvastatin (LIPITOR) 40 MG tablet   Vertigo    Stable. Normal MRI. She declines neurology consult at this time; she will let me know of any new or worsening symptoms, and remain vigilant.       Family history of colon cancer    Likely due for colonoscopy. Referral placed      Relevant Orders   Ambulatory referral to Gastroenterology       I have discontinued Cyara W. Molesworth's amLODipine. I have also changed her atorvastatin. Additionally, I am having her start on FLUoxetine. Lastly, I am having her maintain her cholecalciferol, multivitamin with minerals, ezetimibe, FLUoxetine, and aspirin.   Meds ordered this encounter  Medications  . FLUoxetine (PROZAC) 10 MG capsule    Sig: Take 1 capsule (10 mg total)  by mouth daily.    Dispense:  90 capsule    Refill:  3    Order Specific Question:   Supervising Provider    Answer:   Deborra Medina L [[id number]]  . atorvastatin (LIPITOR) 40 MG tablet    Sig: Take 1 tablet (40 mg total) by mouth daily at 6 PM.    Dispense:  90 tablet    Refill:  1    Order Specific Question:   Supervising Provider    Answer:   Crecencio Mc [[id number]]    Return precautions given.   Risks, benefits, and alternatives of the medications and treatment plan prescribed today were discussed, and patient expressed understanding.   Education regarding symptom management and diagnosis given to patient on AVS.  Continue to follow with Burnard Hawthorne, FNP for routine health maintenance.   Wanda Clayton and I agreed with plan.   [name], FNP

## 2018-09-01 NOTE — Assessment & Plan Note (Signed)
Stable. Normal MRI. She declines neurology consult at this time; she will let me know of any new or worsening symptoms, and remain vigilant.

## 2018-09-01 NOTE — Assessment & Plan Note (Signed)
Increase prozac to 50mg . She will let me know if symptoms are not improved.

## 2018-09-01 NOTE — Patient Instructions (Addendum)
Ensure you have follow up appointment with [facility] 02/2019.   Add 10 mg proxac to the 40mg  for total of 50mg  once per day; if doing well , you may postpone follow up. If this hasnt helped, id like to discuss Wellbutrin at follow up   Monitor blood pressure,  Goal is less than 120/80, based on newest guidelines; if persistently higher, please make sooner follow up appointment so we can recheck you blood pressure and manage medications  May stay on lipitor 40mg  and zetia since is working! :)   Today we discussed referrals, orders. GI- Dr Vira Agar for colonoscopy   I have placed these orders in the system for you.  Please be sure to give [place] a call if you have not heard from our office regarding this. We should hear from [place] within ONE week with information regarding your appointment. If not, please let me know immediately.

## 2018-09-13 DIAGNOSIS — L538 Other specified erythematous conditions: Secondary | ICD-10-CM | POA: Diagnosis not present

## 2018-09-13 DIAGNOSIS — D225 Melanocytic nevi of trunk: Secondary | ICD-10-CM | POA: Diagnosis not present

## 2018-09-13 DIAGNOSIS — D2271 Melanocytic nevi of right lower limb, including hip: Secondary | ICD-10-CM | POA: Diagnosis not present

## 2018-09-13 DIAGNOSIS — L821 Other seborrheic keratosis: Secondary | ICD-10-CM | POA: Diagnosis not present

## 2018-09-13 DIAGNOSIS — D2262 Melanocytic nevi of left upper limb, including shoulder: Secondary | ICD-10-CM | POA: Diagnosis not present

## 2018-09-13 DIAGNOSIS — L820 Inflamed seborrheic keratosis: Secondary | ICD-10-CM | POA: Diagnosis not present

## 2018-09-13 DIAGNOSIS — D2261 Melanocytic nevi of right upper limb, including shoulder: Secondary | ICD-10-CM | POA: Diagnosis not present

## 2018-09-13 DIAGNOSIS — L298 Other pruritus: Secondary | ICD-10-CM | POA: Diagnosis not present

## 2018-09-13 DIAGNOSIS — D2272 Melanocytic nevi of left lower limb, including hip: Secondary | ICD-10-CM | POA: Diagnosis not present

## 2018-10-08 ENCOUNTER — Ambulatory Visit: Payer: PPO

## 2018-10-11 ENCOUNTER — Ambulatory Visit (INDEPENDENT_AMBULATORY_CARE_PROVIDER_SITE_OTHER): Payer: PPO

## 2018-10-11 VITALS — BP 128/80 | HR 98 | Temp 98.3°F | Resp 15 | Ht 65.0 in | Wt 142.0 lb

## 2018-10-11 DIAGNOSIS — Z0000 Encounter for general adult medical examination without abnormal findings: Secondary | ICD-10-CM | POA: Diagnosis not present

## 2018-10-11 NOTE — Progress Notes (Addendum)
Subjective:   Wanda Clayton is a 67 y.o. female who presents for Medicare Annual (Subsequent) preventive examination.  Review of Systems:  No ROS.  Medicare Wellness Visit. Additional risk factors are reflected in the social history. Cardiac Risk Factors include: advanced age (>66men, >83 women);hypertension;smoking/ tobacco exposure     Objective:     Vitals: BP 128/80 (BP Location: Left Arm, Patient Position: Sitting, Cuff Size: Normal)   Pulse 98   Temp 98.3 F (36.8 C) (Oral)   Resp 15   Ht 5\' 5"  (1.651 m)   Wt 142 lb (64.4 kg)   SpO2 98%   BMI 23.63 kg/m   Body mass index is 23.63 kg/m.  Advanced Directives 10/11/2018 10/07/2017  Does Patient Have a Medical Advance Directive? Yes Yes  Type of Advance Directive Living will;Healthcare Power of [place];Living will  Does patient want to make changes to medical advance directive? No - Patient declined -  Copy of [place] in Chart? No - copy requested No - copy requested    Tobacco Social History   Tobacco Use  Smoking Status Current Every Day Smoker  . Packs/day: 1.00  . Years: 36.00  . Pack years: 36.00  . Types: Cigarettes  Smokeless Tobacco Never Used  Tobacco Comment   she plans to quit without medication     Ready to quit: Not Answered Counseling given: Not Answered Comment: she plans to quit without medication   Clinical Intake:  Pre-visit preparation completed: Yes  Pain : No/denies pain     Diabetes: No  How often do you need to have someone help you when you read instructions, pamphlets, or other written materials from your doctor or pharmacy?: 1 - Never  Interpreter Needed?: No     Past Medical History:  Diagnosis Date  . Anxiety   . Depression    Past Surgical History:  Procedure Laterality Date  . carpal tunnel repair    . DENTAL SURGERY    . TUBAL LIGATION    . VAGINAL DELIVERY     x1   Family History  Problem Relation  Age of Onset  . Hypertension Mother   . Colon cancer Mother 57  . Atrial fibrillation Mother   . Heart disease Father   . Diabetes Father   . Colon cancer Father 8  . Congestive Heart Failure Father   . COPD Sister   . Crohn's disease Sister   . Colon cancer Sister 45  . Heart disease Brother   . Heart attack Sister   . Breast cancer Neg Hx    Social History   Socioeconomic History  . Marital status: Married    Spouse name: Not on file  . Number of children: 1  . Years of education: Not on file  . Highest education level: Not on file  Occupational History    Employer: [employer]  Social Needs  . Financial resource strain: Not hard at all  . Food insecurity:    Worry: Never true    Inability: Never true  . Transportation needs:    Medical: No    Non-medical: No  Tobacco Use  . Smoking status: Current Every Day Smoker    Packs/day: 1.00    Years: 36.00    Pack years: 36.00    Types: Cigarettes  . Smokeless tobacco: Never Used  . Tobacco comment: she plans to quit without medication  Substance and Sexual Activity  . Alcohol use:  Yes    Comment: Daily glass wine x2  . Drug use: No  . Sexual activity: Never  Lifestyle  . Physical activity:    Days per week: 0 days    Minutes per session: Not on file  . Stress: To some extent  Relationships  . Social connections:    Talks on phone: Patient refused    Gets together: Patient refused    Attends religious service: Patient refused    Active member of club or organization: Patient refused    Attends meetings of clubs or organizations: Patient refused    Relationship status: Patient refused  Other Topics Concern  . Not on file  Social History Narrative   Works for [employer], retired 06/2016   Married.   Daughter and granddaughter.           Outpatient Encounter Medications as of 10/11/2018  Medication Sig  . aspirin 81 MG tablet Take 81 mg by mouth daily.  Marland Kitchen atorvastatin (LIPITOR) 40 MG tablet Take 1  tablet (40 mg total) by mouth daily at 6 PM.  . cholecalciferol (VITAMIN D) 1000 UNITS tablet Take 1,000 Units by mouth daily.  Marland Kitchen ezetimibe (ZETIA) 10 MG tablet Take 1 tablet (10 mg total) by mouth daily.  Marland Kitchen FLUoxetine (PROZAC) 10 MG capsule Take 1 capsule (10 mg total) by mouth daily.  Marland Kitchen FLUoxetine (PROZAC) 40 MG capsule TAKE 1 CAPSULE (40 MG TOTAL) BY MOUTH DAILY.  . Multiple Vitamins-Minerals (MULTIVITAMIN WITH MINERALS) tablet Take 1 tablet by mouth daily.   No facility-administered encounter medications on file as of 10/11/2018.     Activities of Daily Living In your present state of health, do you have any difficulty performing the following activities: 10/11/2018  Hearing? N  Vision? N  Difficulty concentrating or making decisions? N  Walking or climbing stairs? N  Dressing or bathing? N  Doing errands, shopping? N  Preparing Food and eating ? N  Using the Toilet? N  In the past six months, have you accidently leaked urine? Y  Comment Managed with a daily pad  Do you have problems with loss of bowel control? N  Comment Managed with daily pad  Managing your Medications? N  Managing your Finances? N  Housekeeping or managing your Housekeeping? N  Some recent data might be hidden    Patient Care Team: Burnard Hawthorne, FNP as PCP - General (Family Medicine)    Assessment:   This is a routine wellness examination for Wanda Clayton.  Not yet taking recently added prozac 10mg .  States she is doing well on prozac 40mg .  She is monitoring symptoms closely and agrees to start medication and follow up with her doctor as needed.  Encouraged to take all medications as directed and keep all routine scheduled appointments.   Health Screenings  Mammogram -08/02/18 Colonoscopy -07/23/11. Consultation scheduled 10/2018 Bone Density -09/16/17 Hearing -hearing aids  Hemoglobin A1C -06/30/18 (5.6) Cholesterol -06/30/18 (174) Dental- every 4 months Vision-08/2018  Social  Alcohol intake-  yes Smoking history- current  Smokers in home? self Illicit drug use? none Exercise -None. She plans to start walking for exercise.  Diet- regular Sexually Active -not currently  Safety  Patient feels safe at home.  Patient does have smoke detectors at home  Patient does wear sunscreen or protective clothing when in direct sunlight  Patient does wear seat belt when driving or riding with others.   Activities of Daily Living Patient can do their own household chores. Denies needing assistance  with: driving, feeding themselves, getting from bed to chair, getting to the toilet, bathing/showering, dressing, managing money, climbing flight of stairs, or preparing meals.   Depression Screen Patient denies losing interest in daily life, feeling hopeless, or crying easily over simple problems.   Fall Screen Patient denies being afraid of falling or falling in the last year.   Memory Screen Patient denies problems with memory, misplacing items, and is able to balance checkbook/bank accounts.  Patient is alert, normal appearance, oriented to person/place/and time. Correctly identified the president of the [place], recall of 3/3 objects, and performing simple calculations.  Patient displays appropriate judgement and can read correct time from watch face.   Immunizations The following Immunizations are up to date: Tetanus.   Other Providers Patient Care Team: Burnard Hawthorne, FNP as PCP - General (Family Medicine)  Exercise Activities and Dietary recommendations Current Exercise Habits: The patient does not participate in regular exercise at present  Goals      Patient Stated   . Weight Watchers (pt-stated)     Weight 130lbs Quit smoking       Fall Risk Fall Risk  10/11/2018 10/07/2017 06/29/2017 06/26/2016  Falls in the past year? 0 No No No   Depression Screen PHQ 2/9 Scores 10/11/2018 09/01/2018 10/07/2017 06/29/2017  PHQ - 2 Score 0 2 0 0  PHQ- 9 Score - 5 - 0      Cognitive Function MMSE - Mini Mental State Exam 10/07/2017  Orientation to time 5  Orientation to Place 5  Registration 3  Attention/ Calculation 5  Recall 3  Language- name 2 objects 2  Language- repeat 1  Language- follow 3 step command 3  Language- read & follow direction 1  Write a sentence 1  Copy design 1  Total score 30     6CIT Screen 10/11/2018  What Year? 0 points  What month? 0 points  What time? 0 points  Count back from 20 0 points  Months in reverse 0 points  Repeat phrase 0 points  Total Score 0    Immunization History  Administered Date(s) Administered  . Td 02/13/2016   Screening Tests Health Maintenance  Topic Date Due  . INFLUENZA VACCINE  07/03/2023 (Originally 05/13/2018)  . MAMMOGRAM  08/02/2020  . COLONOSCOPY  07/22/2021  . TETANUS/TDAP  02/12/2026  . DEXA SCAN  Completed  . Hepatitis C Screening  Completed  . PNA vac Low Risk Adult  Discontinued      Plan:    End of life planning; Advanced aging; Advanced directives discussed.  HCPOA/Living Will already completed.  Requested for placement in chart.  I have personally reviewed and noted the following in the patient's chart:   . Medical and social history . Use of alcohol, tobacco or illicit drugs  . Current medications and supplements . Functional ability and status . Nutritional status . Physical activity . Advanced directives . List of other physicians . Hospitalizations, surgeries, and ER visits in previous 12 months . Vitals . Screenings to include cognitive, depression, and falls . Referrals and appointments  In addition, I have reviewed and discussed with patient certain preventive protocols, quality metrics, and best practice recommendations. A written personalized care plan for preventive services as well as general preventive health recommendations were provided to patient.     Varney Biles, LPN  [id number]   Agree with plan. [name], NP

## 2018-10-11 NOTE — Patient Instructions (Addendum)
  Wanda Clayton , Thank you for taking time to come for your Medicare Wellness Visit. I appreciate your ongoing commitment to your health goals. Please review the following plan we discussed and let me know if I can assist you in the future.   Happy New Year!  Follow up as needed.    Bring a copy of your [place] and/or Living Will to be scanned into chart.  Have a great day!  These are the goals we discussed: Goals      Patient Stated   . Weight Watchers (pt-stated)     Weight 130lbs Quit smoking       This is a list of the screening recommended for you and due dates:  Health Maintenance  Topic Date Due  . Flu Shot  07/03/2023*  . Mammogram  08/02/2020  . Colon Cancer Screening  07/22/2021  . Tetanus Vaccine  02/12/2026  . DEXA scan (bone density measurement)  Completed  .  Hepatitis C: One time screening is recommended by Center for Disease Control  (CDC) for  adults born from [birth year] through 1965.   Completed  . Pneumonia vaccines  Discontinued  *Topic was postponed. The date shown is not the original due date.

## 2018-10-19 DIAGNOSIS — Z800 Family history of malignant neoplasm of digestive organs: Secondary | ICD-10-CM | POA: Diagnosis not present

## 2018-11-04 ENCOUNTER — Encounter: Payer: PPO | Admitting: Licensed Clinical Social Worker

## 2018-11-13 ENCOUNTER — Other Ambulatory Visit: Payer: Self-pay | Admitting: Family

## 2019-02-10 ENCOUNTER — Other Ambulatory Visit: Payer: Self-pay | Admitting: Family

## 2019-02-14 ENCOUNTER — Other Ambulatory Visit: Payer: Self-pay

## 2019-02-14 MED ORDER — EZETIMIBE 10 MG PO TABS: 10.0000 mg | ORAL_TABLET | Freq: Every day | ORAL | 0 refills | Status: DC

## 2019-02-25 ENCOUNTER — Other Ambulatory Visit: Payer: Self-pay | Admitting: Family

## 2019-02-25 DIAGNOSIS — E785 Hyperlipidemia, unspecified: Secondary | ICD-10-CM

## 2019-03-02 ENCOUNTER — Encounter: Payer: Self-pay | Admitting: Family

## 2019-03-02 ENCOUNTER — Ambulatory Visit (INDEPENDENT_AMBULATORY_CARE_PROVIDER_SITE_OTHER): Payer: PPO | Admitting: Family

## 2019-03-02 ENCOUNTER — Other Ambulatory Visit: Payer: Self-pay

## 2019-03-02 VITALS — BP 129/69 | HR 99 | Wt 142.8 lb

## 2019-03-02 DIAGNOSIS — I10 Essential (primary) hypertension: Secondary | ICD-10-CM | POA: Diagnosis not present

## 2019-03-02 DIAGNOSIS — R42 Dizziness and giddiness: Secondary | ICD-10-CM

## 2019-03-02 DIAGNOSIS — R251 Tremor, unspecified: Secondary | ICD-10-CM

## 2019-03-02 DIAGNOSIS — Z800 Family history of malignant neoplasm of digestive organs: Secondary | ICD-10-CM | POA: Diagnosis not present

## 2019-03-02 DIAGNOSIS — F418 Other specified anxiety disorders: Secondary | ICD-10-CM

## 2019-03-02 NOTE — Progress Notes (Signed)
This visit type was conducted due to national recommendations for restrictions regarding the COVID-19 pandemic (e.g. social distancing).  This format is felt to be most appropriate for this patient at this time.  All issues noted in this document were discussed and addressed.  No physical exam was performed (except for noted visual exam findings with Video Visits). Virtual Visit via Video Note  I connected with@  on 03/02/19 at  8:00 AM EDT by a video enabled telemedicine application and verified that I am speaking with the correct person using two identifiers.  Location patient: home Location provider:work  Persons participating in the virtual visit: patient, provider  I discussed the limitations of evaluation and management by telemedicine and the availability of in person appointments. The patient expressed understanding and agreed to proceed.   HPI:  Doing well today.  Continues to notices 'shaking' in hands for many years, noticed when holding coffee, worse when in anxious such as when worried about mom or in a crowded room. A little worse because of current stress, caregiving, staying at home.  Not seen throughout the day. Not falls, loss of balance.   Paternal grandfather had PD.   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   Vertigo and headaches have resolved.   Family history of colon cancer. Per patient, told not to have colonoscopy any earlier. Seen [facility] 01/2019.   HTN- at home, 129/69, HR 99 today.  compliant with medication. Denies exertional chest pain or pressure, numbness or tingling radiating to left arm or jaw, palpitations, dizziness, frequent headaches, changes in vision, or shortness of breath.    HLD- compliant with medication.   Depression- Doesn't feel depressed. Increased anxiety. on 40mg  prozac. No Si/hi.   ROS: See pertinent positives and negatives per HPI.  Past Medical History:  Diagnosis Date  . Anxiety   . Depression     Past  Surgical History:  Procedure Laterality Date  . carpal tunnel repair    . DENTAL SURGERY    . TUBAL LIGATION    . VAGINAL DELIVERY     x1    Family History  Problem Relation Age of Onset  . Hypertension Mother   . Colon cancer Mother 47  . Atrial fibrillation Mother   . Heart disease Father   . Diabetes Father   . Colon cancer Father 4  . Congestive Heart Failure Father   . COPD Sister   . Crohn's disease Sister   . Colon cancer Sister 61  . Heart disease Brother   . Heart attack Sister   . Breast cancer Neg Hx     SOCIAL HX: smoker   Current Outpatient Medications:  .  aspirin 81 MG tablet, Take 81 mg by mouth daily., Disp: , Rfl:  .  atorvastatin (LIPITOR) 40 MG tablet, TAKE 1 TABLET (40 MG TOTAL) BY MOUTH DAILY AT 6 PM., Disp: 90 tablet, Rfl: 1 .  ezetimibe (ZETIA) 10 MG tablet, Take 1 tablet (10 mg total) by mouth daily., Disp: 90 tablet, Rfl: 0 .  FLUoxetine (PROZAC) 40 MG capsule, TAKE 1 CAPSULE BY MOUTH EVERY DAY, Disp: 90 capsule, Rfl: 0 .  Multiple Vitamins-Minerals (MULTIVITAMIN WITH MINERALS) tablet, Take 1 tablet by mouth daily., Disp: , Rfl:  .  cholecalciferol (VITAMIN D) 1000 UNITS tablet, Take 1,000 Units by mouth daily., Disp: , Rfl:  .  FLUoxetine (PROZAC) 10 MG capsule, Take 1 capsule (10 mg total) by mouth daily. (Patient not taking: Reported on 03/02/2019), Disp:  90 capsule, Rfl: 3  EXAM: Wt Readings from Last 3 Encounters:  03/02/19 142 lb 12.8 oz (64.8 kg)  10/11/18 142 lb (64.4 kg)  09/01/18 140 lb 12 oz (63.8 kg)    VITALS per patient if applicable:  GENERAL: alert, oriented, appears well and in no acute distress  HEENT: atraumatic, conjunttiva clear, no obvious abnormalities on inspection of external nose and ears  NECK: normal movements of the head and neck  LUNGS: on inspection no signs of respiratory distress, breathing rate appears normal, no obvious gross SOB, gasping or wheezing  CV: no obvious cyanosis  MS: moves all visible  extremities without noticeable abnormality. Fine tremor to bilateral hands. Resolves when resting on thigh.   PSYCH/NEURO: pleasant and cooperative, no obvious depression or anxiety, speech and thought processing grossly intact  ASSESSMENT AND PLAN:  Discussed the following assessment and plan:  Depression with anxiety  Family history of colon cancer - Plan: Ambulatory referral to Hematology / Oncology  Essential hypertension  Tremor  Vertigo  Problem List Items Addressed This Visit      Cardiovascular and Mediastinum   HTN (hypertension)    BP Readings from Last 3 Encounters:  03/02/19 129/69  10/11/18 128/80  09/01/18 128/70   At goal. Will follow.        Other   Depression with anxiety - Primary    Anxiety increased. Advised increase of Prozac to 50 mg.  Patient will let me know how she is doing      Tremor    Chronic, some increase of late, continues to be exacerbated by anxiety.  Was able to see a degree of tremor over video this morning.  Again, I offered beta-blocker therapy and consult with neurology.  Patient would like to increase Prozac as we discussed in the past, and wait on any referral to neurology at this time which is reasonable.  She does have family history of Parkinson's. We will discuss again when she has follow-up in 3 months       Vertigo    Resolved.       Family history of colon cancer    Strongly emphasized the importance of completing work-up based on family history.  She will return stool cards to [facility].  I also reordered referral to hematology oncology for genetics counseling.  Patient understands to let me know if she doesn't get an appointment with genetics. Will follow      Relevant Orders   Ambulatory referral to Hematology / Oncology        I discussed the assessment and treatment plan with the patient. The patient was provided an opportunity to ask questions and all were answered. The patient agreed with the plan and  demonstrated an understanding of the instructions.   The patient was advised to call back or seek an in-person evaluation if the symptoms worsen or if the condition fails to improve as anticipated.   [name], FNP

## 2019-03-02 NOTE — Assessment & Plan Note (Signed)
Resolved

## 2019-03-02 NOTE — Assessment & Plan Note (Addendum)
Chronic, some increase of late, continues to be exacerbated by anxiety.  Was able to see a degree of tremor over video this morning.  Again, I offered beta-blocker therapy and consult with neurology.  Patient would like to increase Prozac as we discussed in the past, and wait on any referral to neurology at this time which is reasonable.  She does have family history of Parkinson's. We will discuss again when she has follow-up in 3 months

## 2019-03-02 NOTE — Patient Instructions (Addendum)
Return stool cards to [facility].   Today we discussed referrals, orders. Oncology/ hematology for genetics screening   I have placed these orders in the system for you.  Please be sure to give [place] a call if you have not heard from our office regarding this. We should hear from [place] within ONE week with information regarding your appointment. If not, please let me know immediately.    I would suggest increasing prozac to 50mg .

## 2019-03-02 NOTE — Assessment & Plan Note (Addendum)
BP Readings from Last 3 Encounters:  03/02/19 129/69  10/11/18 128/80  09/01/18 128/70   At goal. Will follow.

## 2019-03-02 NOTE — Assessment & Plan Note (Signed)
Strongly emphasized the importance of completing work-up based on family history.  She will return stool cards to [facility].  I also reordered referral to hematology oncology for genetics counseling.  Patient understands to let me know if she doesn't get an appointment with genetics. Will follow

## 2019-03-02 NOTE — Assessment & Plan Note (Signed)
Anxiety increased. Advised increase of Prozac to 50 mg.  Patient will let me know how she is doing

## 2019-03-09 ENCOUNTER — Telehealth: Payer: Self-pay | Admitting: *Deleted

## 2019-03-09 NOTE — Telephone Encounter (Signed)
Copied from [place] [id number]. Topic: General - Other >> Mar 09, 2019 10:52 AM Carolyn Stare wrote:   Pt call to req an appt for mid August with Joycelyn Schmid

## 2019-05-05 ENCOUNTER — Other Ambulatory Visit: Payer: Self-pay | Admitting: Family

## 2019-05-12 ENCOUNTER — Other Ambulatory Visit: Payer: Self-pay | Admitting: Cardiovascular Disease

## 2019-05-12 NOTE — Telephone Encounter (Signed)
Patient will need an appointment for further refills, Thanks !  

## 2019-06-09 ENCOUNTER — Other Ambulatory Visit: Payer: Self-pay | Admitting: Cardiovascular Disease

## 2019-06-20 ENCOUNTER — Other Ambulatory Visit: Payer: Self-pay | Admitting: Cardiovascular Disease

## 2019-06-22 ENCOUNTER — Other Ambulatory Visit: Payer: Self-pay

## 2019-06-23 ENCOUNTER — Telehealth: Payer: Self-pay | Admitting: Cardiovascular Disease

## 2019-06-23 NOTE — Telephone Encounter (Signed)
-----   Message from Anselm Pancoast, [place] sent at 06/21/2019  9:05 AM EDT ----- Please contact patient for a follow up appt. with Dr. Rockey Situ.  The patient has a recall for May 2020. Thanks, Ivin Booty

## 2019-06-23 NOTE — Telephone Encounter (Signed)
A message was left with Husband for patient to call back and schedule

## 2019-06-24 ENCOUNTER — Encounter: Payer: PPO | Admitting: Family

## 2019-07-05 ENCOUNTER — Other Ambulatory Visit: Payer: Self-pay | Admitting: Family

## 2019-07-05 DIAGNOSIS — Z1231 Encounter for screening mammogram for malignant neoplasm of breast: Secondary | ICD-10-CM

## 2019-07-06 ENCOUNTER — Other Ambulatory Visit: Payer: Self-pay | Admitting: Cardiovascular Disease

## 2019-07-29 ENCOUNTER — Telehealth: Payer: Self-pay

## 2019-07-29 DIAGNOSIS — Z87891 Personal history of nicotine dependence: Secondary | ICD-10-CM

## 2019-07-29 DIAGNOSIS — Z122 Encounter for screening for malignant neoplasm of respiratory organs: Secondary | ICD-10-CM

## 2019-07-29 NOTE — Telephone Encounter (Signed)
Spoke with pt to inform her it is time for her annual lung cancer screening. Confirmed pt's smoking history (current smoker, 1/2 ppd, smoked for last 35 years). Pt states she has had no major health changes in the last year. She is agreeable to having CT scan scheduled at this time and prefers Monday, Wednesday, or Friday mornings "as early as possible".

## 2019-08-02 NOTE — Telephone Encounter (Signed)
Current smoker, 36.5 pack year

## 2019-08-02 NOTE — Addendum Note (Signed)
Addended by: Lieutenant Diego on: 08/02/2019 10:12 AM   Modules accepted: Orders

## 2019-08-04 ENCOUNTER — Ambulatory Visit: Payer: Self-pay | Admitting: *Deleted

## 2019-08-04 ENCOUNTER — Other Ambulatory Visit: Payer: Self-pay | Admitting: Cardiovascular Disease

## 2019-08-04 NOTE — Telephone Encounter (Signed)
Patient calls with fatigue and headache and body aches. Fever today up to 100.1 oral. Denies cough/SOB/CP/N/V. She denies any contact with a covid19+ person/denies any travels. Eating and drinking with no difficulty voiding, occasional diarrhea with her IBS, nothing unusual. Referred her for covid19 testing today, wear a mask and stay in the vehicle. Come back home to quarantine, encouraged lots of fluids and rest. Call with any breathing concerns or seek treatment after hours at the ED. Take tylenol or advil for discomfort/fever. We will call with results in 2-4 days. Call us with any questions.   Reason for Disposition . [1] Fever AND [2] no signs of serious infection or localizing symptoms (all other triage questions negative)  Answer Assessment - Initial Assessment Questions 1. TEMPERATURE: "What is the most recent temperature?"  "How was it measured?"      100.3 oral  2. ONSET: "When did the fever start?"      This morning.3. SYMPTOMS: "Do you have any other symptoms besides the fever?"  (e.g., colds, headache, sore throat, earache, cough, rash, diarrhea, vomiting, abdominal pain)     Headaches, fatigue. 4. CAUSE: If there are no symptoms, ask: "What do you think is causing the fever?" possible covid10     5. CONTACTS: "Does anyone else in the family have an infection?"    No 6. TREATMENT: "What have you done so far to treat this fever?" (e.g., medications)     None 7. IMMUNOCOMPROMISE: "Do you have of the following: diabetes, HIV positive, splenectomy, cancer chemotherapy, chronic steroid treatment, transplant patient, etc."     no8. PREGNANCY: "Is there any chance you are pregnant?" "When was your last menstrual period?"     na9. TRAVEL: "Have you traveled out of the country in the last month?" (e.g., travel history, exposures)     no  Protocols used: FEVER-A-AH

## 2019-08-05 ENCOUNTER — Other Ambulatory Visit: Payer: Self-pay

## 2019-08-05 DIAGNOSIS — Z20822 Contact with and (suspected) exposure to covid-19: Secondary | ICD-10-CM

## 2019-08-07 LAB — NOVEL CORONAVIRUS, NAA: SARS-CoV-2, NAA: NOT DETECTED

## 2019-08-11 ENCOUNTER — Ambulatory Visit
Admission: RE | Admit: 2019-08-11 | Discharge: 2019-08-11 | Disposition: A | Discharge status: Home / Self Care | Payer: PPO | Source: Ambulatory Visit | Attending: Family | Admitting: Family

## 2019-08-11 DIAGNOSIS — Z1231 Encounter for screening mammogram for malignant neoplasm of breast: Secondary | ICD-10-CM | POA: Diagnosis not present

## 2019-08-12 ENCOUNTER — Ambulatory Visit
Admission: RE | Admit: 2019-08-12 | Discharge: 2019-08-12 | Disposition: A | Discharge status: Home / Self Care | Payer: PPO | Source: Ambulatory Visit | Attending: Oncology | Admitting: Oncology

## 2019-08-12 ENCOUNTER — Other Ambulatory Visit: Payer: Self-pay

## 2019-08-12 DIAGNOSIS — Z87891 Personal history of nicotine dependence: Secondary | ICD-10-CM | POA: Diagnosis not present

## 2019-08-12 DIAGNOSIS — F17210 Nicotine dependence, cigarettes, uncomplicated: Secondary | ICD-10-CM | POA: Diagnosis not present

## 2019-08-12 DIAGNOSIS — Z122 Encounter for screening for malignant neoplasm of respiratory organs: Secondary | ICD-10-CM | POA: Diagnosis not present

## 2019-08-16 ENCOUNTER — Encounter: Payer: Self-pay | Admitting: *Deleted

## 2019-08-17 NOTE — Progress Notes (Signed)
Will need repeat CT chest in future in 1 year likely lung changes of inflammatory will need repeat in 1 year  Coronary artery plaque/disease, control blood pressure, cholesterol and consider cardiology in the future   CT chest results   1. Lung-RADS 2, benign appearance or behavior. Continue annual screening with low-dose chest CT without contrast in 12 months. 2. New bilateral upper lobe predominant areas of ground-glass attenuation which is favored to be postinflammatory in etiology. 3. Coronary artery calcifications

## 2019-08-18 ENCOUNTER — Other Ambulatory Visit: Payer: Self-pay

## 2019-08-18 ENCOUNTER — Encounter: Payer: Self-pay | Admitting: Obstetrics and Gynecology

## 2019-08-18 ENCOUNTER — Ambulatory Visit (INDEPENDENT_AMBULATORY_CARE_PROVIDER_SITE_OTHER): Payer: PPO | Admitting: Obstetrics and Gynecology

## 2019-08-18 ENCOUNTER — Other Ambulatory Visit (HOSPITAL_COMMUNITY)
Admission: RE | Admit: 2019-08-18 | Discharge: 2019-08-18 | Disposition: A | Discharge status: Home / Self Care | Payer: PPO | Source: Ambulatory Visit | Attending: Obstetrics and Gynecology | Admitting: Obstetrics and Gynecology

## 2019-08-18 VITALS — BP 190/90 | HR 99 | Ht 65.0 in | Wt 146.0 lb

## 2019-08-18 DIAGNOSIS — R87810 Cervical high risk human papillomavirus (HPV) DNA test positive: Secondary | ICD-10-CM | POA: Diagnosis not present

## 2019-08-18 DIAGNOSIS — Z124 Encounter for screening for malignant neoplasm of cervix: Secondary | ICD-10-CM

## 2019-08-18 DIAGNOSIS — Z1151 Encounter for screening for human papillomavirus (HPV): Secondary | ICD-10-CM | POA: Insufficient documentation

## 2019-08-21 NOTE — Progress Notes (Signed)
Cardiology Office Note  Date:  08/22/2019   ID:  Joette, Beachley Jan 28, 1951, MRN [id number]  PCP:  Burnard Hawthorne, FNP   Chief Complaint  Patient presents with  . other    12 month follow up. Meds reviewed by the pt. verbally. "doing well."    HPI:  [name] is a 68 yo woman with past medical history of Smoker, avg 1 ppd atherosclerosis seen on CT chest  Hyperlipidemia Hypertension Depression/anxiety Presents for follow-up of her aortic atherosclerosis and hypertension  BP elevated on today's visit, anxious BP elevated last week, before pap smear Has not been checking her blood pressure at home but does have a good blood pressure cuff Was previously given blood pressure pill by primary care but did not take it as she did record 1 blood pressure at home [id number] systolic  CT scan reviewed: Images pulled up and discussed  moderate  Coronary ca and mild in aorta  Still smoking 1 ppd  Flu sx in Aug 04 2019 Week to get over it Seen on CT chest Still with mild cough  Takes care of 2 elderly parents, in the 80s  no chest pain, mild shortness of breath on exertion  No regular exercise program Otherwise active at baseline   EKG personally reviewed by myself on todays visit Shows normal sinus rhythm with rate 86 bpm no significant ST or T-wave changes   She does report some discomfort in her legs when she goes walking.  Denies having chest pain or arm pain when she is exerting herself   episode in December 2018 where room was spinning.  Occurred when laying on back and rolled over to husband.   Chronic tinnitus, hearing loss ( wears hearing aids).   CT scan 2019 Mild to moderate aortic atherosclerosis in the arch, carotids Also noted in the coronary arteries, proximal and mid LAD, And proximal RCA    PMH:   has a past medical history of Anxiety and Depression.  PSH:    Past Surgical History:  Procedure Laterality Date  . carpal tunnel repair    .  DENTAL SURGERY    . TUBAL LIGATION    . VAGINAL DELIVERY     x1    Current Outpatient Medications  Medication Sig Dispense Refill  . aspirin 81 MG tablet Take 81 mg by mouth daily.    Marland Kitchen atorvastatin (LIPITOR) 40 MG tablet TAKE 1 TABLET (40 MG TOTAL) BY MOUTH DAILY AT 6 PM. 90 tablet 1  . cholecalciferol (VITAMIN D) 1000 UNITS tablet Take 1,000 Units by mouth daily.    Marland Kitchen ezetimibe (ZETIA) 10 MG tablet TAKE 1 TABLET (10 MG TOTAL) BY MOUTH DAILY. PLEASE KEEP UPCOMING APPOINTMENT FOR FURTHER REFILLS. 30 tablet 0  . FLUoxetine (PROZAC) 40 MG capsule TAKE 1 CAPSULE BY MOUTH EVERY DAY 90 capsule 0  . Multiple Vitamins-Minerals (MULTIVITAMIN WITH MINERALS) tablet Take 1 tablet by mouth daily.     No current facility-administered medications for this visit.      Allergies:   Pollen extract   Social History:  The patient  reports that she has been smoking cigarettes. She has a 36.00 pack-year smoking history. She has never used smokeless tobacco. She reports current alcohol use. She reports that she does not use drugs.   Family History:   family history includes Atrial fibrillation in her mother; COPD in her sister; Colon cancer (age of onset: 77) in her sister; Colon cancer (age of onset: 30) in  her father; Colon cancer (age of onset: 22) in her mother; Congestive Heart Failure in her father; Crohn's disease in her sister; Diabetes in her father; Heart attack in her sister; Heart disease in her brother and father; Hypertension in her mother.    Review of Systems: Review of Systems  Constitutional: Negative.   HENT: Negative.   Respiratory: Negative.   Cardiovascular: Negative.   Gastrointestinal: Negative.   Musculoskeletal: Negative.   Neurological: Negative.   Psychiatric/Behavioral: The patient is nervous/anxious.   All other systems reviewed and are negative.    PHYSICAL EXAM: VS:  BP (!) 160/90 (BP Location: Left Arm, Patient Position: Sitting, Cuff Size: Normal)   Pulse 86    Temp (!) 96.6 F (35.9 C)   Ht 5\' 5"  (1.651 m)   Wt 145 lb 8 oz (66 kg)   BMI 24.21 kg/m  , BMI Body mass index is 24.21 kg/m. Constitutional:  oriented to person, place, and time. No distress.  HENT:  Head: Grossly normal Eyes:  no discharge. No scleral icterus.  Neck: No JVD,  carotid bruits 1+ on the left greater than right Cardiovascular: Regular rate and rhythm, no murmurs appreciated Pulmonary/Chest: Clear to auscultation bilaterally, no wheezes or rails Abdominal: Soft.  no distension.  no tenderness.  Musculoskeletal: Normal range of motion Neurological:  normal muscle tone. Coordination normal. No atrophy Skin: Skin warm and dry Psychiatric: normal affect, pleasant   Recent Labs: No results found for requested labs within last 8760 hours.    Lipid Panel Lab Results  Component Value Date   CHOL 174 06/30/2018   HDL 73.80 06/30/2018   LDLCALC 67 06/30/2018   TRIG 166.0 (H) 06/30/2018      Wt Readings from Last 3 Encounters:  08/22/19 145 lb 8 oz (66 kg)  08/18/19 146 lb (66.2 kg)  08/12/19 142 lb (64.4 kg)       ASSESSMENT AND PLAN:  Atherosclerosis of aorta (HCC) - Plan: EKG 12-Lead Mild to moderate diffuse disease in her aorta Smoking cessation recommended LDL close to goal CT scan images reviewed with her  Essential hypertension - Plan: EKG 12-Lead Blood pressure elevated today Strongly recommend she monitor blood pressure at home and call our office with some numbers Was previously given blood pressure pill which she did not take Blood pressure elevated last week was told to check it at home which she did not do Discussed the implications of chronic high blood pressure Suspect significant component from anxiety and stress at home taking care of 66 year olds with no help  Mixed hyperlipidemia Cholesterol is at goal on the current lipid regimen. No changes to the medications were made.  Carotid bruit left greater than right Offered carotid  ultrasound, she has declined at this time  Smoker Does not want Chantix Smoking cessation recommended Discussed other strategies for cessation  Coronary artery calcification seen on CT scan Images pulled up and discussed with her, stressed the importance of smoking cessation Cholesterol reasonable  Leg discomfort In peripheral vascular disease Previously offered ABIs, she declined at that time  Disposition:   F/U 12 months   Total encounter time more than 25 minutes  Greater than 50% was spent in counseling and coordination of care with the patient   Orders Placed This Encounter  Procedures  . EKG 12-Lead     Signed, Esmond Plants, M.D., Ph.D. 08/22/2019  [place], [place]

## 2019-08-21 NOTE — Progress Notes (Signed)
[name], female   DOB: 1951-07-11, 68 y.o.   MRN: [id number]  Reason for Consult: Follow-up (Pap smear )   Referred by Burnard Hawthorne, FNP  Subjective:     HPI:  Wanda Clayton is a 68 y.o. female . She is here today for a pap smear. She reports that she feels well. No additional complaints.   Past Medical History:  Diagnosis Date  . Anxiety   . Depression    Family History  Problem Relation Age of Onset  . Hypertension Mother   . Colon cancer Mother 17  . Atrial fibrillation Mother   . Heart disease Father   . Diabetes Father   . Colon cancer Father 69  . Congestive Heart Failure Father   . COPD Sister   . Crohn's disease Sister   . Colon cancer Sister 45  . Heart disease Brother   . Heart attack Sister   . Breast cancer Neg Hx    Past Surgical History:  Procedure Laterality Date  . carpal tunnel repair    . DENTAL SURGERY    . TUBAL LIGATION    . VAGINAL DELIVERY     x1    Short Social History:  Social History   Tobacco Use  . Smoking status: Current Every Day Smoker    Packs/day: 1.00    Years: 36.00    Pack years: 36.00    Types: Cigarettes  . Smokeless tobacco: Never Used  . Tobacco comment: she plans to quit without medication  Substance Use Topics  . Alcohol use: Yes    Comment: Daily glass wine x2    Allergies  Allergen Reactions  . Pollen Extract Itching    Current Outpatient Medications  Medication Sig Dispense Refill  . aspirin 81 MG tablet Take 81 mg by mouth daily.    Marland Kitchen atorvastatin (LIPITOR) 40 MG tablet TAKE 1 TABLET (40 MG TOTAL) BY MOUTH DAILY AT 6 PM. 90 tablet 1  . cholecalciferol (VITAMIN D) 1000 UNITS tablet Take 1,000 Units by mouth daily.    Marland Kitchen FLUoxetine (PROZAC) 40 MG capsule TAKE 1 CAPSULE BY MOUTH EVERY DAY 90 capsule 0  . Multiple Vitamins-Minerals (MULTIVITAMIN WITH MINERALS) tablet Take 1 tablet by mouth daily.    Marland Kitchen ezetimibe (ZETIA) 10 MG tablet TAKE 1 TABLET (10 MG TOTAL) BY MOUTH DAILY.  PLEASE KEEP UPCOMING APPOINTMENT FOR FURTHER REFILLS. (Patient not taking: Reported on 08/18/2019) 30 tablet 0   No current facility-administered medications for this visit.     Review of Systems  Constitutional: Negative for chills, fatigue, fever and unexpected weight change.  HENT: Negative for trouble swallowing.  Eyes: Negative for loss of vision.  Respiratory: Negative for cough, shortness of breath and wheezing.  Cardiovascular: Negative for chest pain, leg swelling, palpitations and syncope.  GI: Negative for abdominal pain, blood in stool, diarrhea, nausea and vomiting.  GU: Negative for difficulty urinating, dysuria, frequency and hematuria.  Musculoskeletal: Negative for back pain, leg pain and joint pain.  Skin: Negative for rash.  Neurological: Negative for dizziness, headaches, light-headedness, numbness and seizures.  Psychiatric: Negative for behavioral problem, confusion, depressed mood and sleep disturbance.        Objective:  Objective   Vitals:   08/18/19 0817  BP: (!) 190/90  Pulse: 99  Weight: 146 lb (66.2 kg)  Height: 5\' 5"  (1.651 m)   Body mass index is 24.3 kg/m.  Physical Exam Vitals signs and nursing note reviewed.  Constitutional:  Appearance: She is well-developed.  HENT:     Head: Normocephalic and atraumatic.  Eyes:     Pupils: Pupils are equal, round, and reactive to light.  Cardiovascular:     Rate and Rhythm: Normal rate and regular rhythm.  Pulmonary:     Effort: Pulmonary effort is normal. No respiratory distress.  Genitourinary:    Comments: External: Normal appearing vulva. No lesions noted.  Speculum examination: Normal appearing cervix. No blood in the vaginal vault. no discharge.   Bimanual examination: Uterus midline, non-tender, normal in size, shape and contour.  No CMT. No adnexal masses. No adnexal tenderness. Pelvis not fixed.    Skin:    General: Skin is warm and dry.  Neurological:     Mental Status: She is  alert and oriented to person, place, and time.  Psychiatric:        Behavior: Behavior normal.        Thought Content: Thought content normal.        Judgment: Judgment normal.         Assessment/Plan:     68 yo here for a pap smear Discussed her elevated BP and risk of complications such a stroke, CVD,  and end organ damage.Encouraged patient to seek emergency car in the ER. She says she will go home and take her BP and if it is still elevated she will go. Says that it is probably just high because she is here in the office. Pap performed.  More than 10  minutes were spent face to face with the patient in the room with more than 50% of the  time spent providing counseling and discussing the plan of management.      Adrian Prows MD [facility], [facility] 08/21/2019 7:03 PM

## 2019-08-22 ENCOUNTER — Ambulatory Visit (INDEPENDENT_AMBULATORY_CARE_PROVIDER_SITE_OTHER): Payer: PPO | Admitting: Cardiovascular Disease

## 2019-08-22 ENCOUNTER — Other Ambulatory Visit: Payer: Self-pay

## 2019-08-22 ENCOUNTER — Encounter: Payer: Self-pay | Admitting: Cardiovascular Disease

## 2019-08-22 VITALS — BP 160/90 | HR 86 | Temp 96.6°F | Ht 65.0 in | Wt 145.5 lb

## 2019-08-22 DIAGNOSIS — E782 Mixed hyperlipidemia: Secondary | ICD-10-CM | POA: Diagnosis not present

## 2019-08-22 DIAGNOSIS — F17200 Nicotine dependence, unspecified, uncomplicated: Secondary | ICD-10-CM | POA: Diagnosis not present

## 2019-08-22 DIAGNOSIS — I739 Peripheral vascular disease, unspecified: Secondary | ICD-10-CM | POA: Diagnosis not present

## 2019-08-22 DIAGNOSIS — I10 Essential (primary) hypertension: Secondary | ICD-10-CM

## 2019-08-22 DIAGNOSIS — I700 Atherosclerosis of aorta: Secondary | ICD-10-CM

## 2019-08-22 NOTE — Patient Instructions (Addendum)
Quit smoking Check blood pressure at home   Medication Instructions:  No changes  If you need a refill on your cardiac medications before your next appointment, please call your pharmacy.    Lab work: No new labs needed   If you have labs (blood work) drawn today and your tests are completely normal, you will receive your results only by: Marland Kitchen MyChart Message (if you have MyChart) OR . A paper copy in the mail If you have any lab test that is abnormal or we need to change your treatment, we will call you to review the results.   Testing/Procedures: No new testing needed   Follow-Up: At [facility], you and your health needs are our priority.  As part of our continuing mission to provide you with exceptional heart care, we have created designated Provider Care Teams.  These Care Teams include your primary Cardiologist (physician) and Advanced Practice Providers (APPs -  Physician Assistants and Nurse Practitioners) who all work together to provide you with the care you need, when you need it.  . You will need a follow up appointment in 12 months .   Please call our office 2 months in advance to schedule this appointment.    . Providers on your designated Care Team:   . Murray Hodgkins, NP . Christell Faith, PA-C . Marrianne Mood, PA-C  Any Other Special Instructions Will Be Listed Below (If Applicable).  For educational health videos Log in to : [email or web address] Or : [email or web address], password : [email or web address]

## 2019-08-23 ENCOUNTER — Other Ambulatory Visit: Payer: Self-pay | Admitting: Family

## 2019-08-23 DIAGNOSIS — E785 Hyperlipidemia, unspecified: Secondary | ICD-10-CM

## 2019-08-24 LAB — CYTOLOGY - PAP
Comment: NEGATIVE
Comment: NEGATIVE
Diagnosis: NEGATIVE
HPV 16: NEGATIVE
HPV 18 / 45: NEGATIVE
High risk HPV: POSITIVE — AB

## 2019-08-26 NOTE — Progress Notes (Signed)
Discussed with patient and that the recommendation would be to repeat a colposcopy. She would prefer to repeat the pap smear in 1 year.  Reasonable plan given last years normal colposcopy.

## 2019-09-06 ENCOUNTER — Other Ambulatory Visit: Payer: Self-pay | Admitting: Cardiovascular Disease

## 2019-09-07 ENCOUNTER — Other Ambulatory Visit: Payer: Self-pay

## 2019-09-30 ENCOUNTER — Telehealth: Payer: Self-pay | Admitting: Family

## 2019-09-30 DIAGNOSIS — M549 Dorsalgia, unspecified: Secondary | ICD-10-CM

## 2019-09-30 MED ORDER — CYCLOBENZAPRINE HCL 10 MG PO TABS: 5.0000 mg | ORAL_TABLET | Freq: Two times a day (BID) | ORAL | 0 refills | Status: DC | PRN

## 2019-09-30 NOTE — Telephone Encounter (Signed)
Call patient I dont have a prob calling in flexeril. Sent in   Please ensure she does not feel she needs any imaging to ensure no fracture   If pain continues beyond this weekend after icing regimen, flexeril, rest, please advise virtual.  Educate the below  Do not drive or operate heavy machinery while on muscle relaxant. Please do not drink alcohol. Only take this medication as needed for acute muscle spasm at bedtime. This medication make you feel drowsy so be very careful.  Stop taking if become too drowsy or somnolent as this puts you at risk for falls. Please contact our office with any questions.

## 2019-09-30 NOTE — Telephone Encounter (Signed)
Patient fell Tuesday on ramp that had frost on it. Pt is hurting lower back left side. She was wondering if she could have a muscle relaxer. No virtual appointments are available and does not want to go to urgent care or hospital because of covid.

## 2019-09-30 NOTE — Telephone Encounter (Signed)
I called and spoke with patient. She stated that she did not feel that she had a fracture. She feels she is "bruised" & that a muscle relaxer will help until she is healed. I advised if worsening pain go to ED or UC & patient verbalized understanding of this. I also advised directions & precautions below for taking muscle relaxer. Pt again verbalized understanding of this.  I asked she please call back if no improvement over the weekend.

## 2019-10-01 ENCOUNTER — Emergency Department
Admission: EM | Admit: 2019-10-01 | Discharge: 2019-10-01 | Disposition: A | Discharge status: Home / Self Care | Payer: PPO | Attending: Emergency Medicine | Admitting: Emergency Medicine

## 2019-10-01 ENCOUNTER — Emergency Department: Payer: PPO

## 2019-10-01 ENCOUNTER — Other Ambulatory Visit: Payer: Self-pay

## 2019-10-01 DIAGNOSIS — Y999 Unspecified external cause status: Secondary | ICD-10-CM | POA: Diagnosis not present

## 2019-10-01 DIAGNOSIS — M549 Dorsalgia, unspecified: Secondary | ICD-10-CM | POA: Diagnosis not present

## 2019-10-01 DIAGNOSIS — Y939 Activity, unspecified: Secondary | ICD-10-CM | POA: Insufficient documentation

## 2019-10-01 DIAGNOSIS — Z7982 Long term (current) use of aspirin: Secondary | ICD-10-CM | POA: Insufficient documentation

## 2019-10-01 DIAGNOSIS — Y929 Unspecified place or not applicable: Secondary | ICD-10-CM | POA: Insufficient documentation

## 2019-10-01 DIAGNOSIS — S3992XA Unspecified injury of lower back, initial encounter: Secondary | ICD-10-CM | POA: Diagnosis not present

## 2019-10-01 DIAGNOSIS — F17210 Nicotine dependence, cigarettes, uncomplicated: Secondary | ICD-10-CM | POA: Insufficient documentation

## 2019-10-01 DIAGNOSIS — W109XXA Fall (on) (from) unspecified stairs and steps, initial encounter: Secondary | ICD-10-CM | POA: Insufficient documentation

## 2019-10-01 DIAGNOSIS — S2242XA Multiple fractures of ribs, left side, initial encounter for closed fracture: Secondary | ICD-10-CM | POA: Diagnosis not present

## 2019-10-01 DIAGNOSIS — I10 Essential (primary) hypertension: Secondary | ICD-10-CM | POA: Diagnosis not present

## 2019-10-01 DIAGNOSIS — Z79899 Other long term (current) drug therapy: Secondary | ICD-10-CM | POA: Diagnosis not present

## 2019-10-01 DIAGNOSIS — S299XXA Unspecified injury of thorax, initial encounter: Secondary | ICD-10-CM | POA: Diagnosis present

## 2019-10-01 MED ORDER — HYDROMORPHONE HCL 1 MG/ML IJ SOLN
0.5000 mg | Freq: Once | INTRAMUSCULAR | Status: AC
  Administered 2019-10-01: 0.5 mg via INTRAMUSCULAR

## 2019-10-01 MED ORDER — LIDOCAINE 5 % EX PTCH
1.0000 | MEDICATED_PATCH | CUTANEOUS | Status: DC
  Administered 2019-10-01: 1 via TRANSDERMAL
  Filled 2019-10-01: qty 1

## 2019-10-01 MED ORDER — TRAMADOL HCL 50 MG PO TABS: 50.0000 mg | ORAL_TABLET | Freq: Two times a day (BID) | ORAL | 0 refills | Status: DC | PRN

## 2019-10-01 MED ORDER — HYDROMORPHONE HCL 1 MG/ML IJ SOLN
INTRAMUSCULAR | Status: AC
  Filled 2019-10-01: qty 1

## 2019-10-01 MED ORDER — LIDOCAINE 5 % EX PTCH: 1.0000 | MEDICATED_PATCH | Freq: Two times a day (BID) | CUTANEOUS | 0 refills | Status: DC

## 2019-10-01 MED ORDER — ORPHENADRINE CITRATE 30 MG/ML IJ SOLN
60.0000 mg | Freq: Two times a day (BID) | INTRAMUSCULAR | Status: DC
  Administered 2019-10-01: 60 mg via INTRAMUSCULAR
  Filled 2019-10-01: qty 2

## 2019-10-01 NOTE — ED Provider Notes (Addendum)
[facility] Emergency Department Provider Note   ____________________________________________   First MD Initiated Contact with Patient 10/01/19 [phone]     (approximate)  I have reviewed the triage vital signs and the nursing notes.   HISTORY  Chief Complaint Back Pain    HPI Wanda Clayton is a 68 y.o. female patient complain back pain and left flank pain secondary to spasms that has increased over the last 3 days after he fell down some stairs.  Patient denies radicular component to her back pain.  Patient denies bladder bowel dysfunction.  Patient rates her pain as a 10/10.  Patient described the pain as sharp/spasmatic".  No palliative measures for complaint.  Patient talked to her doctor yesterday and was given a prescription for Flexeril which she has not picked up.         Past Medical History:  Diagnosis Date  . Anxiety   . Depression     Patient Active Problem List   Diagnosis Date Noted  . Family history of colon cancer 09/01/2018  . Coronary artery calcification seen on CT scan 02/22/2018  . Claudication in peripheral vascular disease ([place]) 02/22/2018  . Smoker 02/21/2018  . Vertigo 01/06/2018  . Atherosclerosis of aorta ([place]) 11/26/2017  . Osteopenia determined by x-ray 09/16/2017  . Chronic diarrhea 12/18/2015  . Tremor 12/18/2015  . Hearing loss 06/20/2015  . Hyperlipidemia 06/20/2015  . Routine general medical examination at a health care facility 06/01/2014  . Ganglion cyst of wrist 02/02/2013  . HTN (hypertension) 01/19/2012  . Atrophic vaginitis 12/11/2011  . Depression with anxiety 12/11/2011    Past Surgical History:  Procedure Laterality Date  . carpal tunnel repair    . DENTAL SURGERY    . TUBAL LIGATION    . VAGINAL DELIVERY     x1    Prior to Admission medications   Medication Sig Start Date End Date Taking? Authorizing Provider  aspirin 81 MG tablet Take 81 mg by mouth daily.    [provider]    atorvastatin (LIPITOR) 40 MG tablet TAKE 1 TABLET (40 MG TOTAL) BY MOUTH DAILY AT 6 PM. 08/23/19   Burnard Hawthorne, FNP  cholecalciferol (VITAMIN D) 1000 UNITS tablet Take 1,000 Units by mouth daily.    [provider]  cyclobenzaprine (FLEXERIL) 10 MG tablet Take 0.5 tablets (5 mg total) by mouth 2 (two) times daily as needed for muscle spasms. 09/30/19   Burnard Hawthorne, FNP  ezetimibe (ZETIA) 10 MG tablet Take 1 tablet (10 mg total) by mouth daily. 09/06/19   Minna Merritts, MD  FLUoxetine (PROZAC) 40 MG capsule TAKE 1 CAPSULE BY MOUTH EVERY DAY 05/05/19   Burnard Hawthorne, FNP  lidocaine (LIDODERM) 5 % Place 1 patch onto the skin every 12 (twelve) hours. Remove & Discard patch within 12 hours or as directed by MD 10/01/19 09/30/20  Sable Feil, PA-C  Multiple Vitamins-Minerals (MULTIVITAMIN WITH MINERALS) tablet Take 1 tablet by mouth daily.    [provider]  traMADol (ULTRAM) 50 MG tablet Take 1 tablet (50 mg total) by mouth every 12 (twelve) hours as needed. 10/01/19   Sable Feil, PA-C    Allergies Pollen extract  Family History  Problem Relation Age of Onset  . Hypertension Mother   . Colon cancer Mother 2  . Atrial fibrillation Mother   . Heart disease Father   . Diabetes Father   . Colon cancer Father 33  . Congestive Heart  Failure Father   . COPD Sister   . Crohn's disease Sister   . Colon cancer Sister 69  . Heart disease Brother   . Heart attack Sister   . Breast cancer Neg Hx     Social History Social History   Tobacco Use  . Smoking status: Current Every Day Smoker    Packs/day: 1.00    Years: 36.00    Pack years: 36.00    Types: Cigarettes  . Smokeless tobacco: Never Used  . Tobacco comment: she plans to quit without medication  Substance Use Topics  . Alcohol use: Yes    Comment: Daily glass wine x2  . Drug use: No    Review of Systems Constitutional: No fever/chills Eyes: No visual changes. ENT: No sore  throat. Cardiovascular: Denies chest pain. Respiratory: Denies shortness of breath. Gastrointestinal: No abdominal pain.  No nausea, no vomiting.  No diarrhea.  No constipation. Genitourinary: Negative for dysuria. Musculoskeletal: Positive for back in left flank pain.   Skin: Negative for rash. Neurological: Negative for headaches, focal weakness or numbness. Psychiatric:  Anxiety and depression. Endocrine:  Hyperlipidemia and hypertension. Allergic/Immunilogical: Pollen ____________________________________________   PHYSICAL EXAM:  VITAL SIGNS: ED Triage Vitals [10/01/19 0730]  Enc Vitals Group     BP (!) 164/83     Pulse Rate (!) 103     Resp 20     Temp 97.6 F (36.4 C)     Temp Source Oral     SpO2 98 %     Weight 145 lb (65.8 kg)     Height 5\' 5"  (1.651 m)     Head Circumference      Peak Flow      Pain Score 10     Pain Loc      Pain Edu?      Excl. in [place]?     Constitutional: Alert and oriented.  Moderate distress.   Neck:No cervical spine tenderness to palpation. Cardiovascular: Normal rate, regular rhythm. Grossly normal heart sounds.  Good peripheral circulation.  Elevated blood pressure. Respiratory: Normal respiratory effort.  No retractions. Lungs CTAB. Gastrointestinal: Soft and nontender. No distention. No abdominal bruits. No CVA tenderness. Genitourinary: Deferred Musculoskeletal: No obvious deformity of the lumbar spine.  Patient did not have guarding palpation of spinal processes.  Patient had decreased range of motion with right lateral movements.  Examination of the chest wall shows no obvious deformity.  Patient has moderate guarding palpation of left lateral ribs 7 through 9.  Neurologic:  Normal speech and language. No gross focal neurologic deficits are appreciated. No gait instability. Skin:  Skin is warm, dry and intact. No rash noted.  No abrasion or ecchymosis. Psychiatric: Mood and affect are normal. Speech and behavior are  normal.  ____________________________________________   LABS (all labs ordered are listed, but only abnormal results are displayed)  Labs Reviewed - No data to display ____________________________________________  EKG   ____________________________________________  RADIOLOGY  ED MD interpretation:    Official radiology report(s): DG Ribs Unilateral W/Chest Left  Result Date: 10/01/2019 CLINICAL DATA:  Left-sided rib pain after fall. EXAM: LEFT RIBS AND CHEST - 3+ VIEW COMPARISON:  CT chest dated August 12, 2019. FINDINGS: Acute minimally displaced fractures of the posterior left ninth and tenth ribs. The heart size and mediastinal contours are within normal limits. Atherosclerotic calcification of the aortic arch. Normal pulmonary vascularity. Chronic bronchitic changes and coarsened interstitial markings. No focal consolidation, pleural effusion, or pneumothorax. IMPRESSION: 1. Acute minimally displaced  fractures of the posterior left ninth and tenth ribs. 2.  No active cardiopulmonary disease. Electronically Signed   By: Titus Dubin M.D.   On: 10/01/2019 09:18   DG Lumbar Spine 2-3 Views  Result Date: 10/01/2019 CLINICAL DATA:  Back pain after fall. EXAM: LUMBAR SPINE - 2-3 VIEW COMPARISON:  None. FINDINGS: Five lumbar type vertebral bodies. No acute fracture or subluxation. Vertebral body heights are preserved. Alignment is normal. Multilevel disc space narrowing, moderate to severe at L1-L2, moderate at L4-L5 and L5-S1. The sacroiliac joints are unremarkable. Aortoiliac atherosclerosis. IMPRESSION: 1.  No acute osseous abnormality. 2. Multilevel lumbar spondylosis, worst at L1-L2. Electronically Signed   By: Titus Dubin M.D.   On: 10/01/2019 09:20    ____________________________________________   PROCEDURES  Procedure(s) performed (including Critical Care):  Procedures   ____________________________________________   INITIAL IMPRESSION / ASSESSMENT AND PLAN /  ED COURSE  As part of my medical decision making, I reviewed the following data within the [place]     Patient presents with back and left rib pain secondary to a fall downstairs 3 days ago.  Physical exam was remarkable for marked guarding palpation of left lateral ribs 7 through 9.  Discussed x-ray findings with patient showed minimal placed rib fractures of the ninth and 10th rib.  Patient given discharge care instructions and advised to take medication as directed.  Patient advised follow-up PCP.    Wanda Clayton was evaluated in Emergency Department on 10/01/2019 for the symptoms described in the history of present illness. She was evaluated in the context of the global COVID-19 pandemic, which necessitated consideration that the patient might be at risk for infection with the SARS-CoV-2 virus that causes COVID-19. Institutional protocols and algorithms that pertain to the evaluation of patients at risk for COVID-19 are in a state of rapid change based on information released by regulatory bodies including the CDC and federal and state organizations. These policies and algorithms were followed during the patient's care in the ED.       ____________________________________________   FINAL CLINICAL IMPRESSION(S) / ED DIAGNOSES  Final diagnoses:  Multiple fractures of ribs, left side, initial encounter for closed fracture     ED Discharge Orders         Ordered    lidocaine (LIDODERM) 5 %  Every 12 hours     10/01/19 0939    traMADol (ULTRAM) 50 MG tablet  Every 12 hours PRN     10/01/19 0939           Note:  This document was prepared using Dragon voice recognition software and may include unintentional dictation errors.    Sable Feil, PA-C 10/01/19 0945    Sable Feil, PA-C 10/01/19 [id number]    Arta Silence, MD 10/01/19 1622

## 2019-10-01 NOTE — ED Triage Notes (Signed)
Pt arrived via EMS from home d/t back spasms that have increase since she fell on Tuesday on some stairs. Pt is in tears and unable to sit down d/t mid-back pain.

## 2019-10-01 NOTE — Discharge Instructions (Addendum)
Follow discharge care instruction take medication as directed. °

## 2019-10-10 DIAGNOSIS — H25813 Combined forms of age-related cataract, bilateral: Secondary | ICD-10-CM | POA: Diagnosis not present

## 2019-10-12 ENCOUNTER — Encounter: Payer: PPO | Admitting: Family

## 2019-10-13 ENCOUNTER — Ambulatory Visit: Payer: PPO

## 2019-10-28 ENCOUNTER — Other Ambulatory Visit: Payer: Self-pay | Admitting: Family

## 2019-10-28 DIAGNOSIS — M549 Dorsalgia, unspecified: Secondary | ICD-10-CM

## 2019-11-25 ENCOUNTER — Other Ambulatory Visit: Payer: Self-pay

## 2019-11-27 ENCOUNTER — Other Ambulatory Visit: Payer: Self-pay | Admitting: Family

## 2019-11-27 DIAGNOSIS — M549 Dorsalgia, unspecified: Secondary | ICD-10-CM

## 2019-11-30 ENCOUNTER — Encounter: Payer: Self-pay | Admitting: Family

## 2019-11-30 ENCOUNTER — Other Ambulatory Visit: Payer: Self-pay

## 2019-11-30 ENCOUNTER — Encounter: Payer: PPO | Admitting: Family

## 2019-11-30 ENCOUNTER — Ambulatory Visit (INDEPENDENT_AMBULATORY_CARE_PROVIDER_SITE_OTHER): Payer: PPO | Admitting: Family

## 2019-11-30 VITALS — BP 156/84 | HR 88 | Temp 96.2°F | Ht 64.0 in | Wt 145.6 lb

## 2019-11-30 DIAGNOSIS — Z0000 Encounter for general adult medical examination without abnormal findings: Secondary | ICD-10-CM | POA: Diagnosis not present

## 2019-11-30 DIAGNOSIS — I10 Essential (primary) hypertension: Secondary | ICD-10-CM | POA: Diagnosis not present

## 2019-11-30 DIAGNOSIS — R1310 Dysphagia, unspecified: Secondary | ICD-10-CM | POA: Diagnosis not present

## 2019-11-30 DIAGNOSIS — I2510 Atherosclerotic heart disease of native coronary artery without angina pectoris: Secondary | ICD-10-CM

## 2019-11-30 DIAGNOSIS — F418 Other specified anxiety disorders: Secondary | ICD-10-CM

## 2019-11-30 LAB — COMPREHENSIVE METABOLIC PANEL
ALT: 25 U/L (ref 0–35)
AST: 24 U/L (ref 0–37)
Albumin: 4.1 g/dL (ref 3.5–5.2)
Alkaline Phosphatase: 104 U/L (ref 39–117)
BUN: 9 mg/dL (ref 6–23)
CO2: 28 mEq/L (ref 19–32)
Calcium: 9.1 mg/dL (ref 8.4–10.5)
Chloride: 101 mEq/L (ref 96–112)
Creatinine, Ser: 0.76 mg/dL (ref 0.40–1.20)
GFR: 75.54 mL/min (ref >60.00)
Glucose, Bld: 100 mg/dL — ABNORMAL HIGH (ref 70–99)
Potassium: 4 mEq/L (ref 3.5–5.1)
Sodium: 135 mEq/L (ref 135–145)
Total Bilirubin: 0.5 mg/dL (ref 0.2–1.2)
Total Protein: 6.9 g/dL (ref 6.0–8.3)

## 2019-11-30 LAB — CBC WITH DIFFERENTIAL/PLATELET
Basophils Absolute: 0 10*3/uL (ref 0.0–0.1)
Basophils Relative: 0.6 % (ref 0.0–3.0)
Eosinophils Absolute: 0.1 10*3/uL (ref 0.0–0.7)
Eosinophils Relative: 0.9 % (ref 0.0–5.0)
HCT: 42.1 % (ref 36.0–46.0)
Hemoglobin: 14 g/dL (ref 12.0–15.0)
Lymphocytes Relative: 25 % (ref 12.0–46.0)
Lymphs Abs: 1.8 10*3/uL (ref 0.7–4.0)
MCHC: 33.3 g/dL (ref 30.0–36.0)
MCV: 95.6 fl (ref 78.0–100.0)
Monocytes Absolute: 0.6 10*3/uL (ref 0.1–1.0)
Monocytes Relative: 7.7 % (ref 3.0–12.0)
Neutro Abs: 4.9 10*3/uL (ref 1.4–7.7)
Neutrophils Relative %: 65.8 % (ref 43.0–77.0)
Platelets: 311 10*3/uL (ref 150.0–400.0)
RBC: 4.41 Mil/uL (ref 3.87–5.11)
RDW: 13.8 % (ref 11.5–15.5)
WBC: 7.4 10*3/uL (ref 4.0–10.5)

## 2019-11-30 LAB — LIPID PANEL
Cholesterol: 185 mg/dL (ref 0–200)
HDL: 91.5 mg/dL (ref >39.00)
LDL Cholesterol: 55 mg/dL (ref 0–99)
NonHDL: 93.96
Total CHOL/HDL Ratio: 2
Triglycerides: 194 mg/dL — ABNORMAL HIGH (ref 0.0–149.0)
VLDL: 38.8 mg/dL (ref 0.0–40.0)

## 2019-11-30 LAB — VITAMIN D 25 HYDROXY (VIT D DEFICIENCY, FRACTURES): VITD: 45.93 ng/mL (ref 30.00–100.00)

## 2019-11-30 LAB — HEMOGLOBIN A1C: Hgb A1c MFr Bld: 5.2 % (ref 4.6–6.5)

## 2019-11-30 LAB — TSH: TSH: 2.4 u[IU]/mL (ref 0.35–4.50)

## 2019-11-30 MED ORDER — AMLODIPINE BESYLATE 5 MG PO TABS: 2.5000 mg | ORAL_TABLET | Freq: Every day | ORAL | 3 refills | Status: DC

## 2019-11-30 NOTE — Assessment & Plan Note (Signed)
Pending ultrasound carotid.  Patient appears asymptomatic at this time

## 2019-11-30 NOTE — Assessment & Plan Note (Signed)
Elevated.  Start amlodipine with close follow-up

## 2019-11-30 NOTE — Progress Notes (Signed)
Subjective:    Patient ID: Carolyn Stare, female    DOB: 1951/01/09, 69 y.o.   MRN: [id number]  CC: CHAMPAIGN WANGELIN is a 69 y.o. female who presents today for physical exam.    HPI: Feels well today, no new concerns. States at home her blood pressure when she checks it sporadically 156/93.  She denies any chest pain, shortness of breath, headache or dizziness.  She continues to follow with Dr Rockey Situ; reviewed his last office visit note.  Patient declined ultrasound carotids at that time She does states she has a longstanding history of trouble swallowing pills.  She states she takes her pills with applesauce.  This is been going on for 10 or more years.  It is unchanged.  She has no problems with solids or other liquids.  She states she had an EGD in the past with Dr. Vira Agar.  Occasionally she does have epigastric burning and will take a Rolaid for this.  No pain with swallowing.  Doing well on Prozac.  Would like to stay on this dose.  Sleeping okay.  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  Patient defers repeat Pap in 1 year.   Bone Health screening/DEXA for 65+:due  Lung Cancer Screening: Participating in [place] lung cancer screening program        pneumococcal - Candidate for; DECLINES  Labs: Screening labs today. Exercise: Gets regular exercise.  Alcohol use: wine at night Smoking/tobacco use: Nonsmoker.    HISTORY:  Past Medical History:  Diagnosis Date  . Anxiety   . Depression     Past Surgical History:  Procedure Laterality Date  . carpal tunnel repair    . DENTAL SURGERY    . TUBAL LIGATION    . VAGINAL DELIVERY     x1   Family History  Problem Relation Age of Onset  . Hypertension Mother   . Colon cancer Mother 10  . Atrial fibrillation Mother   . Heart disease Father   . Diabetes Father   . Colon  cancer Father 6  . Congestive Heart Failure Father   . COPD Sister   . Crohn's disease Sister   . Colon cancer Sister 34  . Heart disease Brother   . Heart attack Sister   . Breast cancer Neg Hx       ALLERGIES: Pollen extract  Current Outpatient Medications on File Prior to Visit  Medication Sig Dispense Refill  . aspirin 81 MG tablet Take 81 mg by mouth daily.    Marland Kitchen atorvastatin (LIPITOR) 40 MG tablet TAKE 1 TABLET (40 MG TOTAL) BY MOUTH DAILY AT 6 PM. 90 tablet 1  . cholecalciferol (VITAMIN D) 1000 UNITS tablet Take 1,000 Units by mouth daily.    Marland Kitchen ezetimibe (ZETIA) 10 MG tablet Take 1 tablet (10 mg total) by mouth daily. 30 tablet 10  . FLUoxetine (PROZAC) 40 MG capsule TAKE 1 CAPSULE BY MOUTH EVERY DAY 90 capsule 0  . Multiple Vitamins-Minerals (MULTIVITAMIN WITH MINERALS) tablet Take 1 tablet by mouth daily.     No current facility-administered medications on file prior to visit.    Social History   Tobacco Use  . Smoking status: Current Every Day Smoker    Packs/day: 1.00    Years: 36.00    Pack years: 36.00    Types: Cigarettes  . Smokeless tobacco: Never  Used  . Tobacco comment: she plans to quit without medication  Substance Use Topics  . Alcohol use: Yes    Comment: Daily glass wine x2  . Drug use: No    Review of Systems  Constitutional: Negative for chills, fever and unexpected weight change.  HENT: Positive for trouble swallowing. Negative for congestion.   Respiratory: Negative for cough.   Cardiovascular: Negative for chest pain, palpitations and leg swelling.  Gastrointestinal: Negative for nausea and vomiting.  Musculoskeletal: Negative for arthralgias and myalgias.  Skin: Negative for rash.  Neurological: Negative for headaches.  Hematological: Negative for adenopathy.  Psychiatric/Behavioral: Negative for confusion.      Objective:    BP (!) 156/84   Pulse 88   Temp (!) 96.2 F (35.7 C) (Temporal)   Ht 5\' 4"  (1.626 m)   Wt 145 lb 9.6 oz  (66 kg)   SpO2 99%   BMI 24.99 kg/m   BP Readings from Last 3 Encounters:  11/30/19 (!) 156/84  10/01/19 (!) 148/73  08/22/19 (!) 160/90   Wt Readings from Last 3 Encounters:  11/30/19 145 lb 9.6 oz (66 kg)  10/01/19 145 lb (65.8 kg)  08/22/19 145 lb 8 oz (66 kg)    Physical Exam Vitals reviewed.  Constitutional:      Appearance: She is well-developed.  Eyes:     Conjunctiva/sclera: Conjunctivae normal.  Neck:     Thyroid: No thyroid mass or thyromegaly.  Cardiovascular:     Rate and Rhythm: Normal rate and regular rhythm.     Pulses: Normal pulses.     Heart sounds: Normal heart sounds.     Comments: Unable to appreciate any bruit carotids bilaterally Pulmonary:     Effort: Pulmonary effort is normal.     Breath sounds: Normal breath sounds. No wheezing, rhonchi or rales.  Lymphadenopathy:     Head:     Right side of head: No submental, submandibular, tonsillar, preauricular, posterior auricular or occipital adenopathy.     Left side of head: No submental, submandibular, tonsillar, preauricular, posterior auricular or occipital adenopathy.     Cervical: No cervical adenopathy.  Skin:    General: Skin is warm and dry.  Neurological:     Mental Status: She is alert.  Psychiatric:        Speech: Speech normal.        Behavior: Behavior normal.        Thought Content: Thought content normal.        Assessment & Plan:   Problem List Items Addressed This Visit      Cardiovascular and Mediastinum   Coronary artery calcification seen on CT scan    Pending ultrasound carotid.  Patient appears asymptomatic at this time      Relevant Medications   amLODipine (NORVASC) 5 MG tablet   Other Relevant Orders   US Carotid Duplex Bilateral   HTN (hypertension)    Elevated.  Start amlodipine with close follow-up      Relevant Medications   amLODipine (NORVASC) 5 MG tablet     Digestive   Trouble swallowing    Chronic, unchanged.  No trouble with liquids or foods.   This is limited to pills alone.  No weight loss.  Discussed her elevated risk of esophageal cancer with her history of smoking.  Also discussed esophageal stricture . advised however patient declines referral to GI at this time.        Other   Depression with anxiety  Stable.  Continue Prozac      Routine general medical examination at a health care facility - Primary    Patient declines pelvic exam in the absence of complaints.  She also follows with OB/GYN.  She also declines breast exam today.  I encouraged self breast exam at home.  She declines pneumonia vaccine.  Screening labs ordered.  Encouraged exercise      Relevant Orders   TSH   CBC with Differential/Platelet   Comprehensive metabolic panel   Hemoglobin A1c   Lipid panel   VITAMIN D 25 Hydroxy (Vit-D Deficiency, Fractures)   DG Bone Density       I have discontinued Eller W. Rehberg's lidocaine, traMADol, and cyclobenzaprine. I am also having her start on amLODipine. Additionally, I am having her maintain her cholecalciferol, multivitamin with minerals, aspirin, FLUoxetine, atorvastatin, and ezetimibe.   Meds ordered this encounter  Medications  . amLODipine (NORVASC) 5 MG tablet    Sig: Take 0.5 tablets (2.5 mg total) by mouth daily.    Dispense:  90 tablet    Refill:  3    Order Specific Question:   Supervising Provider    Answer:   Crecencio Mc [[id number]]    Return precautions given.   Risks, benefits, and alternatives of the medications and treatment plan prescribed today were discussed, and patient expressed understanding.   Education regarding symptom management and diagnosis given to patient on AVS.   Continue to follow with Burnard Hawthorne, FNP for routine health maintenance.   Carolyn Stare and I agreed with plan.   [name], FNP

## 2019-11-30 NOTE — Assessment & Plan Note (Addendum)
Chronic, unchanged.  No trouble with liquids or foods.  This is limited to pills alone.  No weight loss.  Discussed her elevated risk of esophageal cancer with her history of smoking.  Also discussed esophageal stricture . advised however patient declines referral to GI at this time.

## 2019-11-30 NOTE — Assessment & Plan Note (Signed)
Patient declines pelvic exam in the absence of complaints.  She also follows with OB/GYN.  She also declines breast exam today.  I encouraged self breast exam at home.  She declines pneumonia vaccine.  Screening labs ordered.  Encouraged exercise

## 2019-11-30 NOTE — Assessment & Plan Note (Signed)
- 

## 2019-11-30 NOTE — Patient Instructions (Signed)
Start 2.5 mg amlodipine once a day.  After 1 week if blood pressure is not approaching 130 to 120/80, please increase to 5 mg amlodipine once per day.  Please make follow-up with  virtually or in person in 4 weeks.  Please let me know any point if you would like to see GI in regards to difficulty taking pills   please call call and schedule your bone density scan as discussed.   [place]  [place], [place]   Health Maintenance for Postmenopausal Women Menopause is a normal process in which your ability to get pregnant comes to an end. This process happens slowly over many months or years, usually between the ages of 32 and 47. Menopause is complete when you have missed your menstrual periods for 12 months. It is important to talk with your health care provider about some of the most common conditions that affect women after menopause (postmenopausal women). These include heart disease, cancer, and bone loss (osteoporosis). Adopting a healthy lifestyle and getting preventive care can help to promote your health and wellness. The actions you take can also lower your chances of developing some of these common conditions. What should I know about menopause? During menopause, you may get a number of symptoms, such as:  Hot flashes. These can be moderate or severe.  Night sweats.  Decrease in sex drive.  Mood swings.  Headaches.  Tiredness.  Irritability.  Memory problems.  Insomnia. Choosing to treat or not to treat these symptoms is a decision that you make with your health care provider. Do I need hormone replacement therapy?  Hormone replacement therapy is effective in treating symptoms that are caused by menopause, such as hot flashes and night sweats.  Hormone replacement carries certain risks, especially as you become older. If you are thinking about using estrogen or estrogen with progestin, discuss the benefits and  risks with your health care provider. What is my risk for heart disease and stroke? The risk of heart disease, heart attack, and stroke increases as you age. One of the causes may be a change in the body's hormones during menopause. This can affect how your body uses dietary fats, triglycerides, and cholesterol. Heart attack and stroke are medical emergencies. There are many things that you can do to help prevent heart disease and stroke. Watch your blood pressure  High blood pressure causes heart disease and increases the risk of stroke. This is more likely to develop in people who have high blood pressure readings, are of African descent, or are overweight.  Have your blood pressure checked: ? Every 3-5 years if you are 77-10 years of age. ? Every year if you are 81 years old or older. Eat a healthy diet   Eat a diet that includes plenty of vegetables, fruits, low-fat dairy products, and lean protein.  Do not eat a lot of foods that are high in solid fats, added sugars, or sodium. Get regular exercise Get regular exercise. This is one of the most important things you can do for your health. Most adults should:  Try to exercise for at least 150 minutes each week. The exercise should increase your heart rate and make you sweat (moderate-intensity exercise).  Try to do strengthening exercises at least twice each week. Do these in addition to the moderate-intensity exercise.  Spend less time sitting. Even light physical activity can be beneficial. Other tips  Work with your health care provider to  achieve or maintain a healthy weight.  Do not use any products that contain nicotine or tobacco, such as cigarettes, e-cigarettes, and chewing tobacco. If you need help quitting, ask your health care provider.  Know your numbers. Ask your health care provider to check your cholesterol and your blood sugar (glucose). Continue to have your blood tested as directed by your health care provider. Do  I need screening for cancer? Depending on your health history and family history, you may need to have cancer screening at different stages of your life. This may include screening for:  Breast cancer.  Cervical cancer.  Lung cancer.  Colorectal cancer. What is my risk for osteoporosis? After menopause, you may be at increased risk for osteoporosis. Osteoporosis is a condition in which bone destruction happens more quickly than new bone creation. To help prevent osteoporosis or the bone fractures that can happen because of osteoporosis, you may take the following actions:  If you are 2-74 years old, get at least 1,000 mg of calcium and at least 600 mg of vitamin D per day.  If you are older than age 56 but younger than age 35, get at least 1,200 mg of calcium and at least 600 mg of vitamin D per day.  If you are older than age 73, get at least 1,200 mg of calcium and at least 800 mg of vitamin D per day. Smoking and drinking excessive alcohol increase the risk of osteoporosis. Eat foods that are rich in calcium and vitamin D, and do weight-bearing exercises several times each week as directed by your health care provider. How does menopause affect my mental health? Depression may occur at any age, but it is more common as you become older. Common symptoms of depression include:  Low or sad mood.  Changes in sleep patterns.  Changes in appetite or eating patterns.  Feeling an overall lack of motivation or enjoyment of activities that you previously enjoyed.  Frequent crying spells. Talk with your health care provider if you think that you are experiencing depression. General instructions See your health care provider for regular wellness exams and vaccines. This may include:  Scheduling regular health, dental, and eye exams.  Getting and maintaining your vaccines. These include: ? Influenza vaccine. Get this vaccine each year before the flu season begins. ? Pneumonia  vaccine. ? Shingles vaccine. ? Tetanus, diphtheria, and pertussis (Tdap) booster vaccine. Your health care provider may also recommend other immunizations. Tell your health care provider if you have ever been abused or do not feel safe at home. Summary  Menopause is a normal process in which your ability to get pregnant comes to an end.  This condition causes hot flashes, night sweats, decreased interest in sex, mood swings, headaches, or lack of sleep.  Treatment for this condition may include hormone replacement therapy.  Take actions to keep yourself healthy, including exercising regularly, eating a healthy diet, watching your weight, and checking your blood pressure and blood sugar levels.  Get screened for cancer and depression. Make sure that you are up to date with all your vaccines. This information is not intended to replace advice given to you by your health care provider. Make sure you discuss any questions you have with your health care provider. Document Revised: 09/22/2018 Document Reviewed: 09/22/2018 Elsevier Patient Education  2020 [employer].

## 2019-12-28 ENCOUNTER — Encounter: Payer: Self-pay | Admitting: Family

## 2019-12-28 ENCOUNTER — Telehealth (INDEPENDENT_AMBULATORY_CARE_PROVIDER_SITE_OTHER): Payer: PPO | Admitting: Family

## 2019-12-28 DIAGNOSIS — I10 Essential (primary) hypertension: Secondary | ICD-10-CM

## 2019-12-28 NOTE — Assessment & Plan Note (Signed)
Stable, controlled.  Patient to continue 2.5 mg amlodipine.  She will continue to spot check her blood pressure and let me know of any concerns

## 2019-12-28 NOTE — Progress Notes (Signed)
Virtual Visit via Video Note  I connected with@  on 12/28/19 at  8:00 AM EDT by a video enabled telemedicine application and verified that I am speaking with the correct person using two identifiers.  Location patient: home Location provider:work  Persons participating in the virtual visit: patient, provider  I discussed the limitations of evaluation and management by telemedicine and the availability of in person appointments. The patient expressed understanding and agreed to proceed.   HPI:  Follow-up blood pressures since starting amlodipine 2.5 mg .  Feels well today, no complaints.  She notes that she is planning to get Covid vaccine so that she can see her stepdad in assisted living 109/71,  132/71, 129/75. The lowest was 100/52, however that was lower than normal for her  Denies exertional chest pain or pressure, numbness or tingling radiating to left arm or jaw, palpitations, dizziness, frequent headaches, changes in vision, or shortness of breath.        ROS: See pertinent positives and negatives per HPI.  Past Medical History:  Diagnosis Date  . Anxiety   . Depression     Past Surgical History:  Procedure Laterality Date  . carpal tunnel repair    . DENTAL SURGERY    . TUBAL LIGATION    . VAGINAL DELIVERY     x1    Family History  Problem Relation Age of Onset  . Hypertension Mother   . Colon cancer Mother 26  . Atrial fibrillation Mother   . Heart disease Father   . Diabetes Father   . Colon cancer Father 68  . Congestive Heart Failure Father   . COPD Sister   . Crohn's disease Sister   . Colon cancer Sister 59  . Heart disease Brother   . Heart attack Sister   . Breast cancer Neg Hx        Current Outpatient Medications:  .  amLODipine (NORVASC) 5 MG tablet, Take 0.5 tablets (2.5 mg total) by mouth daily., Disp: 90 tablet, Rfl: 3 .  aspirin 81 MG tablet, Take 81 mg by mouth daily., Disp: , Rfl:  .  atorvastatin (LIPITOR) 40 MG tablet, TAKE 1  TABLET (40 MG TOTAL) BY MOUTH DAILY AT 6 PM., Disp: 90 tablet, Rfl: 1 .  cholecalciferol (VITAMIN D) 1000 UNITS tablet, Take 1,000 Units by mouth daily., Disp: , Rfl:  .  ezetimibe (ZETIA) 10 MG tablet, Take 1 tablet (10 mg total) by mouth daily., Disp: 30 tablet, Rfl: 10 .  FLUoxetine (PROZAC) 40 MG capsule, TAKE 1 CAPSULE BY MOUTH EVERY DAY, Disp: 90 capsule, Rfl: 0 .  Multiple Vitamins-Minerals (MULTIVITAMIN WITH MINERALS) tablet, Take 1 tablet by mouth daily., Disp: , Rfl:   EXAM:  VITALS per patient if applicable: BP Readings from Last 3 Encounters:  12/28/19 (!) 145/81  11/30/19 (!) 156/84  10/01/19 (!) 148/73    GENERAL: alert, oriented, appears well and in no acute distress  HEENT: atraumatic, conjunttiva clear, no obvious abnormalities on inspection of external nose and ears  NECK: normal movements of the head and neck  LUNGS: on inspection no signs of respiratory distress, breathing rate appears normal, no obvious gross SOB, gasping or wheezing  CV: no obvious cyanosis  MS: moves all visible extremities without noticeable abnormality  PSYCH/NEURO: pleasant and cooperative, no obvious depression or anxiety, speech and thought processing grossly intact  ASSESSMENT AND PLAN:  Discussed the following assessment and plan:  Essential hypertension Problem List Items Addressed This Visit  Cardiovascular and Mediastinum   HTN (hypertension)    Stable, controlled.  Patient to continue 2.5 mg amlodipine.  She will continue to spot check her blood pressure and let me know of any concerns         -we discussed possible serious and likely etiologies, options for evaluation and workup, limitations of telemedicine visit vs in person visit, treatment, treatment risks and precautions. Pt prefers to treat via telemedicine empirically rather then risking or undertaking an in person visit at this moment. Patient agrees to seek prompt in person care if worsening, new symptoms  arise, or if is not improving with treatment.   I discussed the assessment and treatment plan with the patient. The patient was provided an opportunity to ask questions and all were answered. The patient agreed with the plan and demonstrated an understanding of the instructions.   The patient was advised to call back or seek an in-person evaluation if the symptoms worsen or if the condition fails to improve as anticipated.   [name], FNP

## 2019-12-30 ENCOUNTER — Other Ambulatory Visit: Payer: Self-pay

## 2019-12-30 ENCOUNTER — Ambulatory Visit
Admission: RE | Admit: 2019-12-30 | Discharge: 2019-12-30 | Disposition: A | Discharge status: Home / Self Care | Payer: PPO | Source: Ambulatory Visit | Attending: Family | Admitting: Family

## 2019-12-30 DIAGNOSIS — I6523 Occlusion and stenosis of bilateral carotid arteries: Secondary | ICD-10-CM | POA: Diagnosis not present

## 2019-12-30 DIAGNOSIS — I2510 Atherosclerotic heart disease of native coronary artery without angina pectoris: Secondary | ICD-10-CM | POA: Insufficient documentation

## 2020-01-02 ENCOUNTER — Encounter: Payer: Self-pay | Admitting: Family

## 2020-01-10 ENCOUNTER — Encounter: Payer: Self-pay | Admitting: Family

## 2020-01-11 ENCOUNTER — Telehealth: Payer: Self-pay | Admitting: Family

## 2020-01-11 DIAGNOSIS — I6523 Occlusion and stenosis of bilateral carotid arteries: Secondary | ICD-10-CM | POA: Insufficient documentation

## 2020-01-11 NOTE — Telephone Encounter (Signed)
Call pt I do not think I told patient one more thing regarding her mild carotid stenosis that we saw on ultrasound This needs to be monitored annually.  Please ask and stress the importance that that she needs to call us annually to schedule annual US carotid.

## 2020-01-12 NOTE — Telephone Encounter (Signed)
LMTCB

## 2020-01-12 NOTE — Telephone Encounter (Signed)
I called patient & stressed the importance of this. Patient stated that she would not forget to let us know!

## 2020-01-16 NOTE — Telephone Encounter (Signed)
close

## 2020-01-24 ENCOUNTER — Telehealth: Payer: Self-pay | Admitting: Family

## 2020-01-24 NOTE — Telephone Encounter (Signed)
Call pt Remind her to schedule DEXA at [facility]

## 2020-01-24 NOTE — Telephone Encounter (Signed)
Patient plans to do DEXA in fall with mammogram.

## 2020-02-08 ENCOUNTER — Encounter: Payer: Self-pay | Admitting: Nurse Practitioner

## 2020-02-08 ENCOUNTER — Ambulatory Visit: Payer: PPO | Admitting: Nurse Practitioner

## 2020-02-08 ENCOUNTER — Other Ambulatory Visit: Payer: Self-pay

## 2020-02-08 ENCOUNTER — Ambulatory Visit: Payer: PPO

## 2020-02-08 VITALS — BP 160/80 | HR 106 | Temp 98.0°F | Ht 65.0 in | Wt 147.0 lb

## 2020-02-08 DIAGNOSIS — M79672 Pain in left foot: Secondary | ICD-10-CM

## 2020-02-08 DIAGNOSIS — S92352A Displaced fracture of fifth metatarsal bone, left foot, initial encounter for closed fracture: Secondary | ICD-10-CM | POA: Insufficient documentation

## 2020-02-08 HISTORY — DX: Pain in left foot: M79.672

## 2020-02-08 NOTE — Progress Notes (Signed)
Established Patient Office Visit  Subjective:  [name], female    DOB: 10-04-1951  Age: 69 y.o. MRN: [id number]  CC:  Chief Complaint  Patient presents with  . Acute Visit    foot pain from fall/possible sprain    HPI Wanda Clayton is a 69 year old patient with HTN, atherosclerosis of aorta, peripheral vascular disease, tobacco use, presents for acute left foot pain and swelling sustained when she fell on the floor on 01/27/2020.  Patient was walking in her kitchen and her feet got tangled up and her 4 dogs were around her.  She has had pain with ambulation and has been walking and wearing her daughter's boot.  She has noted swelling around her left foot up into the ankle area.  It is becoming increasingly more painful.  She presents today to get an x-ray to see if she fractured her foot.    Past Medical History:  Diagnosis Date  . Acute pain of left foot 02/08/2020  . Anxiety   . Depression     Past Surgical History:  Procedure Laterality Date  . carpal tunnel repair    . DENTAL SURGERY    . TUBAL LIGATION    . VAGINAL DELIVERY     x1    Family History  Problem Relation Age of Onset  . Hypertension Mother   . Colon cancer Mother 60  . Atrial fibrillation Mother   . Heart disease Father   . Diabetes Father   . Colon cancer Father 57  . Congestive Heart Failure Father   . COPD Sister   . Crohn's disease Sister   . Colon cancer Sister 72  . Heart disease Brother   . Heart attack Sister   . Breast cancer Neg Hx     Social History   Socioeconomic History  . Marital status: Married    Spouse name: Not on file  . Number of children: 1  . Years of education: Not on file  . Highest education level: Not on file  Occupational History    Employer: [employer]  Tobacco Use  . Smoking status: Current Every Day Smoker    Packs/day: 1.00    Years: 36.00    Pack years: 36.00    Types: Cigarettes  . Smokeless tobacco: Never Used  . Tobacco  comment: she plans to quit without medication  Substance and Sexual Activity  . Alcohol use: Yes    Comment: Daily glass wine x2  . Drug use: No  . Sexual activity: Not Currently    Birth control/protection: None, Post-menopausal  Other Topics Concern  . Not on file  Social History Narrative   Works for [employer], retired 06/2016   Married.   Daughter and granddaughter.          Social Determinants of Health   Financial Resource Strain:   . Difficulty of Paying Living Expenses:   Food Insecurity:   . Worried About Charity fundraiser in the Last Year:   . Arboriculturist in the Last Year:   Transportation Needs:   . Film/video editor (Medical):   Marland Kitchen Lack of Transportation (Non-Medical):   Physical Activity:   . Days of Exercise per Week:   . Minutes of Exercise per Session:   Stress:   . Feeling of Stress :   Social Connections:   . Frequency of Communication with Friends and Family:   . Frequency of Social Gatherings with Friends  and Family:   . Attends Religious Services:   . Active Member of Clubs or Organizations:   . Attends Archivist Meetings:   Marland Kitchen Marital Status:   Intimate Partner Violence:   . Fear of Current or Ex-Partner:   . Emotionally Abused:   Marland Kitchen Physically Abused:   . Sexually Abused:     Outpatient Medications Prior to Visit  Medication Sig Dispense Refill  . amLODipine (NORVASC) 5 MG tablet Take 0.5 tablets (2.5 mg total) by mouth daily. 90 tablet 3  . aspirin 81 MG tablet Take 81 mg by mouth daily.    Marland Kitchen atorvastatin (LIPITOR) 40 MG tablet TAKE 1 TABLET (40 MG TOTAL) BY MOUTH DAILY AT 6 PM. 90 tablet 1  . cholecalciferol (VITAMIN D) 1000 UNITS tablet Take 1,000 Units by mouth daily.    Marland Kitchen ezetimibe (ZETIA) 10 MG tablet Take 1 tablet (10 mg total) by mouth daily. 30 tablet 10  . FLUoxetine (PROZAC) 40 MG capsule TAKE 1 CAPSULE BY MOUTH EVERY DAY 90 capsule 0  . Multiple Vitamins-Minerals (MULTIVITAMIN WITH MINERALS) tablet Take 1  tablet by mouth daily.     No facility-administered medications prior to visit.    Allergies  Allergen Reactions  . Pollen Extract Itching     Review of Systems  Constitutional: Negative for fever.  HENT: Negative for congestion.   Musculoskeletal:       Positive left foot pain and swelling after fall. She has a DEXA scan arranged.    Positives per HPI    Objective:    Physical Exam  Constitutional: She appears well-developed and well-nourished.  Musculoskeletal:        General: Tenderness and edema present. No deformity.     Comments: Left foot with swelling, erythema, slight bruising over dorsal foot. Tenderness fifth metatarsal lateral dorsal foot and swelling- pitting edema at the ankle and slight swelling to shin. Foot is warm pulses are present - faint , ROM is limited and d/t tenderness passive ROM not performed. Ankle is not tender and has full ROM.   Vitals reviewed.   BP (!) 160/80 (BP Location: Left Arm, Patient Position: Sitting, Cuff Size: Small)   Pulse (!) 106   Temp 98 F (36.7 C) (Skin)   Ht 5\' 5"  (1.651 m)   Wt 147 lb (66.7 kg)   SpO2 97%   BMI 24.46 kg/m  Wt Readings from Last 3 Encounters:  02/08/20 147 lb (66.7 kg)  12/28/19 145 lb (65.8 kg)  11/30/19 145 lb 9.6 oz (66 kg)    There are no preventive care reminders to display for this patient.  There are no preventive care reminders to display for this patient.  Lab Results  Component Value Date   TSH 2.40 11/30/2019   Lab Results  Component Value Date   WBC 7.4 11/30/2019   HGB 14.0 11/30/2019   HCT 42.1 11/30/2019   MCV 95.6 11/30/2019   PLT 311.0 11/30/2019   Lab Results  Component Value Date   NA 135 11/30/2019   K 4.0 11/30/2019   CO2 28 11/30/2019   GLUCOSE 100 (H) 11/30/2019   BUN 9 11/30/2019   CREATININE 0.76 11/30/2019   BILITOT 0.5 11/30/2019   ALKPHOS 104 11/30/2019   AST 24 11/30/2019   ALT 25 11/30/2019   PROT 6.9 11/30/2019   ALBUMIN 4.1 11/30/2019   CALCIUM  9.1 11/30/2019   GFR 75.54 11/30/2019   Lab Results  Component Value Date   CHOL 185 11/30/2019  Lab Results  Component Value Date   HDL 91.50 11/30/2019   Lab Results  Component Value Date   LDLCALC 55 11/30/2019   Lab Results  Component Value Date   TRIG 194.0 (H) 11/30/2019   Lab Results  Component Value Date   CHOLHDL 2 11/30/2019   Lab Results  Component Value Date   HGBA1C 5.2 11/30/2019      Assessment & Plan:   Problem List Items Addressed This Visit      Musculoskeletal and Integument   Closed fracture of fifth metatarsal bone of left foot     Other   Acute pain of left foot    Obtain xray and it showed oblique fracture of 5th metatarsal. DOI 01/27/20 Ortho referral in 2 hours. She has a boot at home.  Will not use crutches. Rest, elevate, non ambulatory and Tylenol for pain.  DEXA ordered for previous fractures.        Other Visit Diagnoses    Left foot pain    -  Primary   Relevant Orders   DG Foot Complete Left (Completed)     Obtain xray and it showed oblique fracture of 5th metatarsal. DOI 01/27/20 Ortho referral in 2 hours. She has a boot at home.  Will not use crutches. Rest, elevate, non ambulatory and Tylenol for pain.  DEXA ordered for previous fractures.   No orders of the defined types were placed in this encounter.   Follow-up: Return if symptoms worsen or fail to improve.  This visit occurred during the SARS-CoV-2 public health emergency.  Safety protocols were in place, including screening questions prior to the visit, additional usage of staff PPE, and extensive cleaning of exam room while observing appropriate contact time as indicated for disinfecting solutions.    Denice Paradise, NP

## 2020-02-08 NOTE — Assessment & Plan Note (Addendum)
Obtain xray and it showed oblique fracture of 5th metatarsal. DOI 01/27/20 Ortho referral in 2 hours. She has a boot at home.  Will not use crutches. Rest, elevate, non ambulatory and Tylenol for pain.  DEXA ordered for previous fractures.

## 2020-02-08 NOTE — Patient Instructions (Addendum)
It was great to see you again.  You have a fracture of your foot and we made an appointment at [facility] with Dr. Sabra Heck who is  a Podiatrist at 11:00 today. Please do not weight bear if you can help it.   Elevate the foot ,rest, and Tylenol as needed.

## 2020-02-23 ENCOUNTER — Telehealth: Payer: Self-pay | Admitting: Family

## 2020-02-23 NOTE — Telephone Encounter (Signed)
Call pt Bone density order is expiring Please ask pt to schedule at [facility] or we can cancel     [facility]  [place], [place]

## 2020-02-24 NOTE — Telephone Encounter (Signed)
Patient asked if we could reorder at her appointment in September. She lost her sister this week & has a fractured foot. She would prefer to wait.

## 2020-02-24 NOTE — Telephone Encounter (Signed)
Noted  

## 2020-02-26 ENCOUNTER — Other Ambulatory Visit: Payer: Self-pay | Admitting: Family

## 2020-02-26 DIAGNOSIS — E785 Hyperlipidemia, unspecified: Secondary | ICD-10-CM

## 2020-02-27 ENCOUNTER — Telehealth: Payer: Self-pay | Admitting: Family

## 2020-02-27 NOTE — Telephone Encounter (Signed)
Left message for patient to call back and schedule Medicare Annual Wellness Visit (AWV) either virtually or audio only.  Last AWV 10/11/18; please schedule at anytime with Denisa O'Brien-Blaney at [facility].

## 2020-02-28 ENCOUNTER — Ambulatory Visit (INDEPENDENT_AMBULATORY_CARE_PROVIDER_SITE_OTHER): Payer: PPO

## 2020-02-28 VITALS — Ht 65.0 in | Wt 147.0 lb

## 2020-02-28 DIAGNOSIS — Z0000 Encounter for general adult medical examination without abnormal findings: Secondary | ICD-10-CM

## 2020-02-28 NOTE — Patient Instructions (Addendum)
  Wanda Clayton , Thank you for taking time to come for your Medicare Wellness Visit. I appreciate your ongoing commitment to your health goals. Please review the following plan we discussed and let me know if I can assist you in the future.   These are the goals we discussed: Goals      Patient Stated   . Quit smoking (pt-stated)     Plans to decrease/stop smoking soon when life stressors settle a bit more       This is a list of the screening recommended for you and due dates:  Health Maintenance  Topic Date Due  . Flu Shot  07/03/2023*  . Colon Cancer Screening  07/22/2021  . Mammogram  08/10/2021  . Tetanus Vaccine  02/12/2026  . DEXA scan (bone density measurement)  Completed  . COVID-19 Vaccine  Completed  .  Hepatitis C: One time screening is recommended by Center for Disease Control  (CDC) for  adults born from [birth year] through 1965.   Completed  . Pneumonia vaccines  Discontinued  *Topic was postponed. The date shown is not the original due date.

## 2020-02-28 NOTE — Progress Notes (Addendum)
Subjective:   Wanda Clayton is a 69 y.o. female who presents for Medicare Annual (Subsequent) preventive examination.  Review of Systems:  No ROS.  Medicare Wellness Virtual Visit.  Visual/audio telehealth visit, UTA vital signs.   Monitors BP at home regularly. Reports WNL. See social history for additional risk factors.   Cardiac Risk Factors include: advanced age (>57men, >38 women);diabetes mellitus;hypertension     Objective:     Vitals: Ht 5\' 5"  (1.651 m)   Wt 147 lb (66.7 kg)   BMI 24.46 kg/m   Body mass index is 24.46 kg/m.  Advanced Directives 02/28/2020 10/01/2019 10/11/2018 10/07/2017  Does Patient Have a Medical Advance Directive? Yes No Yes Yes  Type of Paramedic of [place];Living will - Living will;Healthcare Power of [place];Living will  Does patient want to make changes to medical advance directive? No - Patient declined - No - Patient declined -  Copy of [place] in Chart? No - copy requested - No - copy requested No - copy requested    Tobacco Social History   Tobacco Use  Smoking Status Current Every Day Smoker  . Packs/day: 1.00  . Years: 36.00  . Pack years: 36.00  . Types: Cigarettes  Smokeless Tobacco Never Used  Tobacco Comment   she plans to quit without medication     Ready to quit: Not Answered Counseling given: Not Answered Comment: she plans to quit without medication   Clinical Intake:  Pre-visit preparation completed: Yes        Diabetes: No  How often do you need to have someone help you when you read instructions, pamphlets, or other written materials from your doctor or pharmacy?: 1 - Never  Interpreter Needed?: No     Past Medical History:  Diagnosis Date  . Acute pain of left foot 02/08/2020  . Anxiety   . Depression    Past Surgical History:  Procedure Laterality Date  . carpal tunnel repair    . DENTAL SURGERY    . TUBAL LIGATION     . VAGINAL DELIVERY     x1   Family History  Problem Relation Age of Onset  . Hypertension Mother   . Colon cancer Mother 24  . Atrial fibrillation Mother   . Heart disease Father   . Diabetes Father   . Colon cancer Father 62  . Congestive Heart Failure Father   . COPD Sister   . Crohn's disease Sister   . Colon cancer Sister 3  . Heart disease Brother   . Heart attack Sister   . Breast cancer Neg Hx    Social History   Socioeconomic History  . Marital status: Married    Spouse name: Not on file  . Number of children: 1  . Years of education: Not on file  . Highest education level: Not on file  Occupational History    Employer: [employer]  Tobacco Use  . Smoking status: Current Every Day Smoker    Packs/day: 1.00    Years: 36.00    Pack years: 36.00    Types: Cigarettes  . Smokeless tobacco: Never Used  . Tobacco comment: she plans to quit without medication  Substance and Sexual Activity  . Alcohol use: Yes    Comment: Daily glass wine x2  . Drug use: No  . Sexual activity: Not Currently    Birth control/protection: None, Post-menopausal  Other Topics Concern  . Not  on file  Social History Narrative   Works for [employer], retired 06/2016   Married.   Daughter and granddaughter.          Social Determinants of Health   Financial Resource Strain:   . Difficulty of Paying Living Expenses:   Food Insecurity:   . Worried About Charity fundraiser in the Last Year:   . Arboriculturist in the Last Year:   Transportation Needs:   . Film/video editor (Medical):   Marland Kitchen Lack of Transportation (Non-Medical):   Physical Activity:   . Days of Exercise per Week:   . Minutes of Exercise per Session:   Stress:   . Feeling of Stress :   Social Connections:   . Frequency of Communication with Friends and Family:   . Frequency of Social Gatherings with Friends and Family:   . Attends Religious Services:   . Active Member of Clubs or Organizations:   .  Attends Archivist Meetings:   Marland Kitchen Marital Status:     Outpatient Encounter Medications as of 02/28/2020  Medication Sig  . amLODipine (NORVASC) 5 MG tablet Take 0.5 tablets (2.5 mg total) by mouth daily.  Marland Kitchen aspirin 81 MG tablet Take 81 mg by mouth daily.  Marland Kitchen atorvastatin (LIPITOR) 40 MG tablet TAKE 1 TABLET (40 MG TOTAL) BY MOUTH DAILY AT 6 PM.  . cholecalciferol (VITAMIN D) 1000 UNITS tablet Take 1,000 Units by mouth daily.  Marland Kitchen ezetimibe (ZETIA) 10 MG tablet Take 1 tablet (10 mg total) by mouth daily.  Marland Kitchen FLUoxetine (PROZAC) 40 MG capsule TAKE 1 CAPSULE BY MOUTH EVERY DAY  . Multiple Vitamins-Minerals (MULTIVITAMIN WITH MINERALS) tablet Take 1 tablet by mouth daily.   No facility-administered encounter medications on file as of 02/28/2020.    Activities of Daily Living In your present state of health, do you have any difficulty performing the following activities: 02/28/2020 11/30/2019  Hearing? Y N  Comment Hearing aid -  Vision? N N  Difficulty concentrating or making decisions? N N  Walking or climbing stairs? Y N  Comment Currently wears medical boot L foot, Fx -  Dressing or bathing? N N  Doing errands, shopping? N N  Preparing Food and eating ? N -  Using the Toilet? N -  In the past six months, have you accidently leaked urine? Y -  Comment Managed with daily liner -  Do you have problems with loss of bowel control? N -  Managing your Medications? N -  Managing your Finances? N -  Housekeeping or managing your Housekeeping? N -  Some recent data might be hidden    Patient Care Team: Burnard Hawthorne, FNP as PCP - General (Family Medicine)    Assessment:   This is a routine wellness examination for Wanda Clayton.  Nurse connected with patient 02/28/20 at 10:30 AM EDT by a telephone enabled telemedicine application, patient has no access to video or difficulty with video and verified that I am speaking with the correct person using two identifiers. Patient stated full  name and DOB. Patient gave permission to continue with telephone visit. Patient's location was at home and Nurse's location was at [place] office.   Patient is alert and oriented x3. Patient denies difficulty focusing or concentrating.  Health Maintenance Due: See completed HM at the end of note.   Eye: Visual acuity not assessed. Virtual visit. Followed by their ophthalmologist.  Dental: Visits every 3 months.    Hearing: Hearing  aids- yes  Safety:  Patient feels safe at home- yes Patient does have smoke detectors at home- yes Patient does wear sunscreen or protective clothing when in direct sunlight - yes Patient does wear seat belt when in a moving vehicle - yes Patient drives- yes Adequate lighting in walkways free from debris- yes Grab bars and handrails used as appropriate- yes Ambulates with an assistive device- no  Social: Alcohol intake - yes      Smoking history- current Illicit drug use? none  Medication: Taking as directed and without issues.  Pill box in use -yes  Self managed - yes   Covid-19: Precautions and sickness symptoms discussed. Wears mask, social distancing, hand hygiene as appropriate.   Activities of Daily Living Patient denies needing assistance with: household chores, feeding themselves, getting from bed to chair, getting to the toilet, bathing/showering, dressing, managing money, or preparing meals.   Discussed the importance of a healthy diet, water intake and the benefits of aerobic exercise.   Physical activity- active around the home, no routine.  Diet:  Regular Water: good intake  Other Providers Patient Care Team: Burnard Hawthorne, FNP as PCP - General (Family Medicine) Exercise Activities and Dietary recommendations Current Exercise Habits: The patient does not participate in regular exercise at present  Goals      Patient Stated   . Quit smoking (pt-stated)     Plans to decrease/stop smoking soon when life stressors  settle a bit more       Fall Risk Fall Risk  02/28/2020 02/08/2020 11/30/2019 09/07/2019 10/11/2018  Falls in the past year? (No Data) 1 1 0 0  Comment None since last reported 2 weeks ago - - [place] Telephone Survey: data to providers prior to load -  Number falls in past yr: - 1 0 - -  Injury with Fall? - 1 1 - -  Risk for fall due to : - History of fall(s) History of fall(s) - -  Follow up Falls evaluation completed Falls evaluation completed Falls evaluation completed - -   Timed Get Up and Go performed: no, virtual visit  Depression Screen PHQ 2/9 Scores 02/28/2020 11/30/2019 03/02/2019 10/11/2018  PHQ - 2 Score 0 2 1 0  PHQ- 9 Score - 4 2 -     Cognitive Function MMSE - Mini Mental State Exam 10/07/2017  Orientation to time 5  Orientation to Place 5  Registration 3  Attention/ Calculation 5  Recall 3  Language- name 2 objects 2  Language- repeat 1  Language- follow 3 step command 3  Language- read & follow direction 1  Write a sentence 1  Copy design 1  Total score 30     6CIT Screen 02/28/2020 10/11/2018  What Year? 0 points 0 points  What month? 0 points 0 points  What time? 0 points 0 points  Count back from 20 - 0 points  Months in reverse - 0 points  Repeat phrase - 0 points  Total Score - 0    Immunization History  Administered Date(s) Administered  . PFIZER SARS-COV-2 Vaccination 01/06/2020, 01/27/2020  . Td 02/13/2016   Screening Tests Health Maintenance  Topic Date Due  . INFLUENZA VACCINE  07/03/2023 (Originally 05/13/2020)  . COLONOSCOPY  07/22/2021  . MAMMOGRAM  08/10/2021  . TETANUS/TDAP  02/12/2026  . DEXA SCAN  Completed  . COVID-19 Vaccine  Completed  . Hepatitis C Screening  Completed  . PNA vac Low Risk Adult  Discontinued  Plan:   Keep all routine maintenance appointments.   Follow up 06/29/20 @ 9:00  Medicare Attestation I have personally reviewed: The patient's medical and social history Their use of alcohol, tobacco or  illicit drugs Their current medications and supplements The patient's functional ability including ADLs,fall risks, home safety risks, cognitive, and hearing and visual impairment Diet and physical activities Evidence for depression   I have reviewed and discussed with patient certain preventive protocols, quality metrics, and best practice recommendations.      Varney Biles, LPN  [id number]   Agree with plan. [name], NP

## 2020-02-29 DIAGNOSIS — S92352A Displaced fracture of fifth metatarsal bone, left foot, initial encounter for closed fracture: Secondary | ICD-10-CM | POA: Diagnosis not present

## 2020-03-21 ENCOUNTER — Telehealth: Payer: Self-pay | Admitting: Family

## 2020-03-21 NOTE — Telephone Encounter (Signed)
Reviewing chart Let patient know that she is due for bone density, may I order this ?

## 2020-03-22 ENCOUNTER — Other Ambulatory Visit: Payer: Self-pay | Admitting: Internal Medicine

## 2020-03-22 ENCOUNTER — Telehealth: Payer: Self-pay | Admitting: Internal Medicine

## 2020-03-22 DIAGNOSIS — E2839 Other primary ovarian failure: Secondary | ICD-10-CM

## 2020-03-22 DIAGNOSIS — M8580 Other specified disorders of bone density and structure, unspecified site: Secondary | ICD-10-CM

## 2020-03-22 NOTE — Telephone Encounter (Signed)
Please schedule DEXA [place] and inform pt   Thanks Adrian

## 2020-03-22 NOTE — Telephone Encounter (Signed)
Order placed [place]

## 2020-03-22 NOTE — Telephone Encounter (Signed)
Wanda Clayton states that she has been refusing to schedule for her Bone density because they want her to go to [place]. Requests an order be placed for a more local location. She would prefer to go to regional for her Bone Density Scan.

## 2020-03-28 DIAGNOSIS — S92352A Displaced fracture of fifth metatarsal bone, left foot, initial encounter for closed fracture: Secondary | ICD-10-CM | POA: Diagnosis not present

## 2020-04-05 NOTE — Telephone Encounter (Signed)
I spoke with pt today and gave pt the number to call and schedule at [facility]. Pt agreed.

## 2020-04-09 NOTE — Telephone Encounter (Signed)
Your patient   Thanks [place]

## 2020-05-02 DIAGNOSIS — S92352A Displaced fracture of fifth metatarsal bone, left foot, initial encounter for closed fracture: Secondary | ICD-10-CM | POA: Diagnosis not present

## 2020-05-16 ENCOUNTER — Ambulatory Visit
Admission: RE | Admit: 2020-05-16 | Discharge: 2020-05-16 | Disposition: A | Discharge status: Home / Self Care | Payer: PPO | Source: Ambulatory Visit | Attending: Internal Medicine | Admitting: Internal Medicine

## 2020-05-16 ENCOUNTER — Other Ambulatory Visit: Payer: Self-pay

## 2020-05-16 DIAGNOSIS — M85852 Other specified disorders of bone density and structure, left thigh: Secondary | ICD-10-CM | POA: Diagnosis not present

## 2020-05-16 DIAGNOSIS — M8580 Other specified disorders of bone density and structure, unspecified site: Secondary | ICD-10-CM | POA: Diagnosis not present

## 2020-05-16 DIAGNOSIS — E2839 Other primary ovarian failure: Secondary | ICD-10-CM | POA: Diagnosis not present

## 2020-05-16 DIAGNOSIS — R29890 Loss of height: Secondary | ICD-10-CM | POA: Diagnosis not present

## 2020-05-16 DIAGNOSIS — Z780 Asymptomatic menopausal state: Secondary | ICD-10-CM | POA: Diagnosis not present

## 2020-06-08 ENCOUNTER — Other Ambulatory Visit: Payer: Self-pay | Admitting: Cardiovascular Disease

## 2020-06-29 ENCOUNTER — Encounter: Payer: Self-pay | Admitting: Family

## 2020-06-29 ENCOUNTER — Telehealth (INDEPENDENT_AMBULATORY_CARE_PROVIDER_SITE_OTHER): Payer: PPO | Admitting: Family

## 2020-06-29 VITALS — BP 130/71 | HR 97 | Ht 64.0 in | Wt 150.0 lb

## 2020-06-29 DIAGNOSIS — E782 Mixed hyperlipidemia: Secondary | ICD-10-CM | POA: Diagnosis not present

## 2020-06-29 DIAGNOSIS — F418 Other specified anxiety disorders: Secondary | ICD-10-CM

## 2020-06-29 DIAGNOSIS — Z1231 Encounter for screening mammogram for malignant neoplasm of breast: Secondary | ICD-10-CM

## 2020-06-29 DIAGNOSIS — I10 Essential (primary) hypertension: Secondary | ICD-10-CM | POA: Diagnosis not present

## 2020-06-29 DIAGNOSIS — I700 Atherosclerosis of aorta: Secondary | ICD-10-CM | POA: Diagnosis not present

## 2020-06-29 NOTE — Assessment & Plan Note (Signed)
Stable, continue lipitor

## 2020-06-29 NOTE — Assessment & Plan Note (Signed)
Well controlled , continue prozac

## 2020-06-29 NOTE — Assessment & Plan Note (Signed)
discussed ASA use at length today, elevated CVD risk.  Advised reasonable to stay on  ASA 81mg . We will continue to discuss at future visits.  She will discuss with Dr Rockey Situ as well.

## 2020-06-29 NOTE — Assessment & Plan Note (Signed)
Slightly elevated. Reiterated the importance of blood pressure log from home as she may benefit from amlodipine 5mg . She will call me with readings.

## 2020-06-29 NOTE — Progress Notes (Signed)
Verbal consent for services obtained from patient prior to services given to TELEPHONE visit:   Location of call:  provider at work patient at home  Names of all persons present for services: [name], NP and patient Chief complaint:   Feels well today No complaints.   HTN- at home 130/71 today.  No cp, left arm numbness, sob.   HLD- compliant with lipitor 40mg . No myalgias.   Depression and anxiety- compliant with prozac 40mg . Feels well on this dose.   Compliant with asa 81mg . No bleeding.    Recent left foot fracture following with Dr Sabra Heck  Due mammogram and CT chest lung cancer screening  History, background, results pertinent:  Wt Readings from Last 3 Encounters:  06/29/20 150 lb (68 kg)  02/28/20 147 lb (66.7 kg)  02/08/20 147 lb (66.7 kg)     A/P/next steps: Problem List Items Addressed This Visit      Cardiovascular and Mediastinum   Atherosclerosis of aorta ([place])    discussed ASA use at length today, elevated CVD risk.  Advised reasonable to stay on  ASA 81mg . We will continue to discuss at future visits.  She will discuss with Dr Rockey Situ as well.        HTN (hypertension)    Slightly elevated. Reiterated the importance of blood pressure log from home as she may benefit from amlodipine 5mg . She will call me with readings.         Other   Depression with anxiety    Well controlled , continue prozac      Hyperlipidemia    Stable, continue lipitor       Other Visit Diagnoses    Encounter for screening mammogram for malignant neoplasm of breast    -  Primary   Relevant Orders   MM 3D SCREEN BREAST BILATERAL    Patient will call to schedule mammogram and CT chest.    I spent 21 min  discussing plan of care over the phone.

## 2020-06-29 NOTE — Patient Instructions (Addendum)
It is imperative that you are seen AT least twice per year for labs and monitoring. Monitor blood pressure at home and me 5-6 reading on separate days. Goal is less than 120/80, based on newest guidelines, however we certainly want to be less than 130/80;  if persistently higher, please make sooner follow up appointment so we can recheck you blood pressure and manage/ adjust medications.   please call  and schedule your 3D mammogram as discussed.   [place]  [place], [place]   Call to make an appointment for Annual Lung cancer screen  With CT Chest: [phone]. Let me know if any issues in doing so.  As discussed, for now we will continue aspirin 81mg  with close attention to benefit versus risk. Please discuss all of this Dr Rockey Situ.   Benefit being reduced risk of ischemic stroke, non fatal heart attack and colorectal cancer ( if used for 10 years). However these benefits are most apparent in adults aged 65 to 85 years with a ?10% 10-year cardiovasular risk . Persons who are not at increased risk for bleeding, have a life expectancy of at least 10 years, and are willing to take low-dose aspirin daily for at least 10 years are more likely to benefit. Persons who place a higher value on the potential benefits than the potential harms may choose to initiate low-dose aspirin.  At 70 years and above the risk of gastrointestinal bleeding, falls,  hemorrhagic stroke increases therefore becoming a very personal discussion in regards to whether you continue aspirin 81 mg.   For now , we have opted to continue however please bring to my attention at follow so we can continue to have this discussion.    As discussed, an annual skin exam is very important.    Please call and make an appointment to be evaluated.  Options in [place]  include: [facility] [phone])  or [facility] [phone])

## 2020-07-05 ENCOUNTER — Telehealth: Payer: Self-pay | Admitting: *Deleted

## 2020-07-05 DIAGNOSIS — Z122 Encounter for screening for malignant neoplasm of respiratory organs: Secondary | ICD-10-CM

## 2020-07-05 DIAGNOSIS — Z87891 Personal history of nicotine dependence: Secondary | ICD-10-CM

## 2020-07-05 NOTE — Telephone Encounter (Signed)
Contacted and scheduled. Current smoker, 37.25 pack year

## 2020-08-14 ENCOUNTER — Other Ambulatory Visit: Payer: Self-pay

## 2020-08-14 ENCOUNTER — Ambulatory Visit
Admission: RE | Admit: 2020-08-14 | Discharge: 2020-08-14 | Disposition: A | Discharge status: Home / Self Care | Payer: PPO | Source: Ambulatory Visit | Attending: Family | Admitting: Family

## 2020-08-14 DIAGNOSIS — Z1231 Encounter for screening mammogram for malignant neoplasm of breast: Secondary | ICD-10-CM | POA: Diagnosis not present

## 2020-08-16 ENCOUNTER — Other Ambulatory Visit: Payer: Self-pay

## 2020-08-16 ENCOUNTER — Ambulatory Visit
Admission: RE | Admit: 2020-08-16 | Discharge: 2020-08-16 | Disposition: A | Discharge status: Home / Self Care | Payer: PPO | Source: Ambulatory Visit | Attending: Oncology | Admitting: Oncology

## 2020-08-16 DIAGNOSIS — F17210 Nicotine dependence, cigarettes, uncomplicated: Secondary | ICD-10-CM | POA: Diagnosis not present

## 2020-08-16 DIAGNOSIS — Z122 Encounter for screening for malignant neoplasm of respiratory organs: Secondary | ICD-10-CM | POA: Insufficient documentation

## 2020-08-16 DIAGNOSIS — Z87891 Personal history of nicotine dependence: Secondary | ICD-10-CM | POA: Insufficient documentation

## 2020-08-18 ENCOUNTER — Encounter: Payer: Self-pay | Admitting: *Deleted

## 2020-08-21 ENCOUNTER — Telehealth: Payer: Self-pay | Admitting: Family

## 2020-08-21 NOTE — Telephone Encounter (Signed)
-----   Message from Lieutenant Diego, RN sent at 08/18/2020 10:12 AM EDT ----- We will send interpretation of these results to the patient. We will also contact them close to the time their annual lung screening scan is due next year for scheduling.  Thank you, Raquel Sarna

## 2020-08-21 NOTE — Telephone Encounter (Signed)
Patient called & informed. She stated that she will let us know if she does not hear about getting scheduled. She also said that she will make sure that she maintains cholesterol medication.

## 2020-08-21 NOTE — Telephone Encounter (Signed)
Call pt    I received results from your annual CT lung scan from [facility] which showed benign findings of lungs. You will need do this again next year so please ensure you are contacted in regards to this and certainly call our office if you are not contacted for another test.   It was noted again on the exam that you have coronary artery atherosclerosis, or often referred to as hardening of the arteries.   Please continue cholesterol medications.

## 2020-08-22 ENCOUNTER — Other Ambulatory Visit: Payer: Self-pay | Admitting: Family

## 2020-08-22 DIAGNOSIS — E785 Hyperlipidemia, unspecified: Secondary | ICD-10-CM

## 2020-08-29 ENCOUNTER — Telehealth: Payer: Self-pay | Admitting: Family

## 2020-08-29 ENCOUNTER — Other Ambulatory Visit: Payer: Self-pay | Admitting: Cardiovascular Disease

## 2020-08-29 NOTE — Telephone Encounter (Signed)
Patient called stayed that she has been taking the was dosage of her medication should have been taking 40 mg but was taking the 10mg  now that she is out of the 10mg  she wanted to know if it was ok to start taking the 40 mg they are dated 2020 spoke with Fransisco Beau he ok to start taking her 40 mg

## 2020-09-04 NOTE — Progress Notes (Signed)
Cardiology Office Note  Date:  09/05/2020   ID:  Cleda, Imel Jun 15, 1951, MRN [id number]  PCP:  Burnard Hawthorne, FNP   Chief Complaint  Patient presents with  . Follow-up    1 Year follow up and per patient she has no issues or concerns she wishes to discuss at today's visit. Medications verbally reviewed with patient.     HPI:  [name] is a 69 yo woman with past medical history of Smoker, avg 1 ppd atherosclerosis seen on CT chest  Hyperlipidemia Hypertension Depression/anxiety Presents for follow-up of her aortic atherosclerosis and hypertension  She reports doing well overall Over the past year has had falls x 2, broke foot Appeared to be mechanical falls Takes care of both of her parents who live at [facility] with disabilities  Reports compliance with her statin, Zetia, blood pressure pill Blood pressure well controlled at home No regular exercise, mild shortness of breath with exertion, nothing new Denies any symptoms concerning for angina  Still smoking 1 pack/day  Prior imaging reviewed CT scan reviewed: moderate coronary ca and mild in aorta  EKG personally reviewed by myself on todays visit Shows normal sinus rhythm with rate 100 bpm no significant ST or T-wave changes  Other past medical history reviewed   episode in December 2018room was spinning.  Occurred when laying on back and rolled over to husband.   Chronic tinnitus, hearing loss ( wears hearing aids).   CT scan 2019 Mild to moderate aortic atherosclerosis in the arch, carotids Also noted in the coronary arteries, proximal and mid LAD, And proximal RCA   PMH:   has a past medical history of Acute pain of left foot (02/08/2020), Anxiety, and Depression.  PSH:    Past Surgical History:  Procedure Laterality Date  . carpal tunnel repair    . DENTAL SURGERY    . TUBAL LIGATION    . VAGINAL DELIVERY     x1    Current Outpatient Medications  Medication Sig Dispense  Refill  . amLODipine (NORVASC) 5 MG tablet Take 0.5 tablets (2.5 mg total) by mouth daily. 90 tablet 3  . aspirin 81 MG tablet Take 81 mg by mouth daily.    Marland Kitchen atorvastatin (LIPITOR) 40 MG tablet TAKE 1 TABLET (40 MG TOTAL) BY MOUTH DAILY AT 6 PM. 90 tablet 1  . cholecalciferol (VITAMIN D) 1000 UNITS tablet Take 1,000 Units by mouth daily.    Marland Kitchen ezetimibe (ZETIA) 10 MG tablet TAKE 1 TABLET BY MOUTH DAILY. PLEASE SCHEDULE OFFICE VISIT FOR FURTHER REFILLS. THANK YOU! 90 tablet 0  . FLUoxetine (PROZAC) 40 MG capsule TAKE 1 CAPSULE BY MOUTH EVERY DAY 90 capsule 0  . Multiple Vitamins-Minerals (MULTIVITAMIN WITH MINERALS) tablet Take 1 tablet by mouth daily.     No current facility-administered medications for this visit.     Allergies:   Pollen extract   Social History:  The patient  reports that she has been smoking cigarettes. She has a 36.00 pack-year smoking history. She has never used smokeless tobacco. She reports current alcohol use. She reports that she does not use drugs.   Family History:   family history includes Atrial fibrillation in her mother; COPD in her sister; Colon cancer (age of onset: 29) in her sister; Colon cancer (age of onset: 34) in her father; Colon cancer (age of onset: 64) in her mother; Congestive Heart Failure in her father; Crohn's disease in her sister; Diabetes in her father; Heart attack  in her sister; Heart disease in her brother and father; Hypertension in her mother.    Review of Systems: Review of Systems  Constitutional: Negative.   HENT: Negative.   Respiratory: Negative.   Cardiovascular: Negative.   Gastrointestinal: Negative.   Musculoskeletal: Negative.   Neurological: Negative.   Psychiatric/Behavioral: The patient is nervous/anxious.   All other systems reviewed and are negative.    PHYSICAL EXAM: VS:  BP 132/86 (BP Location: Left Arm, Patient Position: Sitting, Cuff Size: Normal)   Pulse 100   Ht 5\' 4"  (1.626 m)   Wt 147 lb (66.7 kg)    SpO2 98%   BMI 25.23 kg/m  , BMI Body mass index is 25.23 kg/m. Constitutional:  oriented to person, place, and time. No distress.  HENT:  Head: Grossly normal Eyes:  no discharge. No scleral icterus.  Neck: No JVD, no carotid bruits  Cardiovascular: Regular rate and rhythm, no murmurs appreciated Pulmonary/Chest: Clear to auscultation bilaterally, no wheezes or rails Abdominal: Soft.  no distension.  no tenderness.  Musculoskeletal: Normal range of motion Neurological:  normal muscle tone. Coordination normal. No atrophy Skin: Skin warm and dry Psychiatric: normal affect, pleasant  Recent Labs: 11/30/2019: ALT 25; BUN 9; Creatinine, Ser 0.76; Hemoglobin 14.0; Platelets 311.0; Potassium 4.0; Sodium 135; TSH 2.40    Lipid Panel Lab Results  Component Value Date   CHOL 185 11/30/2019   HDL 91.50 11/30/2019   LDLCALC 55 11/30/2019   TRIG 194.0 (H) 11/30/2019      Wt Readings from Last 3 Encounters:  09/05/20 147 lb (66.7 kg)  08/16/20 150 lb (68 kg)  06/29/20 150 lb (68 kg)      ASSESSMENT AND PLAN:  Atherosclerosis of aorta ([place]) - Plan: EKG 12-Lead Mild to moderate diffuse disease in her aorta Smoking cessation recommended Recommend she stay on her statin with Zetia LDL at goal low cholesterol is mildly elevated Lifestyle modification recommended  Essential hypertension - Plan: EKG 12-Lead Blood pressure is well controlled on today's visit. No changes made to the medications. She will monitor pulse at home, typically runs 80s, but pulse 100 today  Mixed hyperlipidemia Cholesterol is at goal on the current lipid regimen. No changes to the medications were made.  Carotid stenosis Mild disease bilaterally on ultrasound March 2021  Smoker Does not want Chantix Recommend she continue to work on her smoking cessation  Coronary artery calcification seen on CT scan On cholesterol medication, smoking cessation recommended    Total encounter time more than 25  minutes  Greater than 50% was spent in counseling and coordination of care with the patient   Orders Placed This Encounter  Procedures  . EKG 12-Lead     Signed, Esmond Plants, M.D., Ph.D. 09/05/2020  [place], [place]

## 2020-09-05 ENCOUNTER — Ambulatory Visit: Payer: PPO | Admitting: Cardiovascular Disease

## 2020-09-05 ENCOUNTER — Encounter: Payer: Self-pay | Admitting: Cardiovascular Disease

## 2020-09-05 ENCOUNTER — Other Ambulatory Visit: Payer: Self-pay

## 2020-09-05 VITALS — BP 132/86 | HR 100 | Ht 64.0 in | Wt 147.0 lb

## 2020-09-05 DIAGNOSIS — I10 Essential (primary) hypertension: Secondary | ICD-10-CM | POA: Diagnosis not present

## 2020-09-05 DIAGNOSIS — E782 Mixed hyperlipidemia: Secondary | ICD-10-CM | POA: Diagnosis not present

## 2020-09-05 DIAGNOSIS — I2510 Atherosclerotic heart disease of native coronary artery without angina pectoris: Secondary | ICD-10-CM | POA: Diagnosis not present

## 2020-09-05 DIAGNOSIS — F17200 Nicotine dependence, unspecified, uncomplicated: Secondary | ICD-10-CM | POA: Diagnosis not present

## 2020-09-05 DIAGNOSIS — I700 Atherosclerosis of aorta: Secondary | ICD-10-CM

## 2020-09-05 DIAGNOSIS — I739 Peripheral vascular disease, unspecified: Secondary | ICD-10-CM

## 2020-09-05 DIAGNOSIS — I6523 Occlusion and stenosis of bilateral carotid arteries: Secondary | ICD-10-CM

## 2020-09-05 NOTE — Patient Instructions (Signed)
Medication Instructions:  No changes  If you need a refill on your cardiac medications before your next appointment, please call your pharmacy.    Lab work: No new labs needed   If you have labs (blood work) drawn today and your tests are completely normal, you will receive your results only by: . MyChart Message (if you have MyChart) OR . A paper copy in the mail If you have any lab test that is abnormal or we need to change your treatment, we will call you to review the results.   Testing/Procedures: No new testing needed   Follow-Up: At [facility], you and your health needs are our priority.  As part of our continuing mission to provide you with exceptional heart care, we have created designated Provider Care Teams.  These Care Teams include your primary Cardiologist (physician) and Advanced Practice Providers (APPs -  Physician Assistants and Nurse Practitioners) who all work together to provide you with the care you need, when you need it.  . You will need a follow up appointment in 12 months  . Providers on your designated Care Team:   . Christopher Berge, NP . Ryan Dunn, PA-C . Jacquelyn Visser, PA-C  Any Other Special Instructions Will Be Listed Below (If Applicable).  COVID-19 Vaccine Information can be found at: [email or web address] For questions related to vaccine distribution or appointments, please email [email or web address] or call [phone].     

## 2020-09-21 DIAGNOSIS — B078 Other viral warts: Secondary | ICD-10-CM | POA: Diagnosis not present

## 2020-09-21 DIAGNOSIS — L538 Other specified erythematous conditions: Secondary | ICD-10-CM | POA: Diagnosis not present

## 2020-09-21 DIAGNOSIS — L820 Inflamed seborrheic keratosis: Secondary | ICD-10-CM | POA: Diagnosis not present

## 2020-10-09 ENCOUNTER — Ambulatory Visit: Payer: PPO | Admitting: Family

## 2020-10-11 ENCOUNTER — Other Ambulatory Visit: Payer: Self-pay

## 2020-10-11 DIAGNOSIS — H25813 Combined forms of age-related cataract, bilateral: Secondary | ICD-10-CM | POA: Diagnosis not present

## 2020-10-15 ENCOUNTER — Encounter: Payer: Self-pay | Admitting: Family

## 2020-10-15 ENCOUNTER — Other Ambulatory Visit: Payer: Self-pay

## 2020-10-15 ENCOUNTER — Ambulatory Visit (INDEPENDENT_AMBULATORY_CARE_PROVIDER_SITE_OTHER): Payer: PPO | Admitting: Family

## 2020-10-15 VITALS — BP 142/82 | HR 101 | Temp 98.6°F | Ht 64.0 in | Wt 150.2 lb

## 2020-10-15 DIAGNOSIS — I700 Atherosclerosis of aorta: Secondary | ICD-10-CM

## 2020-10-15 DIAGNOSIS — R251 Tremor, unspecified: Secondary | ICD-10-CM | POA: Diagnosis not present

## 2020-10-15 DIAGNOSIS — E782 Mixed hyperlipidemia: Secondary | ICD-10-CM | POA: Diagnosis not present

## 2020-10-15 DIAGNOSIS — I10 Essential (primary) hypertension: Secondary | ICD-10-CM

## 2020-10-15 DIAGNOSIS — F418 Other specified anxiety disorders: Secondary | ICD-10-CM

## 2020-10-15 LAB — COMPREHENSIVE METABOLIC PANEL
ALT: 38 U/L — ABNORMAL HIGH (ref 0–35)
AST: 43 U/L — ABNORMAL HIGH (ref 0–37)
Albumin: 4.2 g/dL (ref 3.5–5.2)
Alkaline Phosphatase: 93 U/L (ref 39–117)
BUN: 14 mg/dL (ref 6–23)
CO2: 27 mEq/L (ref 19–32)
Calcium: 8.8 mg/dL (ref 8.4–10.5)
Chloride: 100 mEq/L (ref 96–112)
Creatinine, Ser: 0.77 mg/dL (ref 0.40–1.20)
GFR: 78.66 mL/min (ref >60.00)
Glucose, Bld: 93 mg/dL (ref 70–99)
Potassium: 4.5 mEq/L (ref 3.5–5.1)
Sodium: 136 mEq/L (ref 135–145)
Total Bilirubin: 0.5 mg/dL (ref 0.2–1.2)
Total Protein: 6.8 g/dL (ref 6.0–8.3)

## 2020-10-15 LAB — CBC WITH DIFFERENTIAL/PLATELET
Basophils Absolute: 0.1 10*3/uL (ref 0.0–0.1)
Basophils Relative: 0.9 % (ref 0.0–3.0)
Eosinophils Absolute: 0.1 10*3/uL (ref 0.0–0.7)
Eosinophils Relative: 1 % (ref 0.0–5.0)
HCT: 42.4 % (ref 36.0–46.0)
Hemoglobin: 14.5 g/dL (ref 12.0–15.0)
Lymphocytes Relative: 25 % (ref 12.0–46.0)
Lymphs Abs: 2 10*3/uL (ref 0.7–4.0)
MCHC: 34.1 g/dL (ref 30.0–36.0)
MCV: 96.9 fl (ref 78.0–100.0)
Monocytes Absolute: 0.6 10*3/uL (ref 0.1–1.0)
Monocytes Relative: 7.8 % (ref 3.0–12.0)
Neutro Abs: 5.3 10*3/uL (ref 1.4–7.7)
Neutrophils Relative %: 65.3 % (ref 43.0–77.0)
Platelets: 311 10*3/uL (ref 150.0–400.0)
RBC: 4.38 Mil/uL (ref 3.87–5.11)
RDW: 13.3 % (ref 11.5–15.5)
WBC: 8.2 10*3/uL (ref 4.0–10.5)

## 2020-10-15 LAB — LIPID PANEL
Cholesterol: 193 mg/dL (ref 0–200)
HDL: 86 mg/dL (ref >39.00)
NonHDL: 106.74
Total CHOL/HDL Ratio: 2
Triglycerides: 270 mg/dL — ABNORMAL HIGH (ref 0.0–149.0)
VLDL: 54 mg/dL — ABNORMAL HIGH (ref 0.0–40.0)

## 2020-10-15 LAB — LDL CHOLESTEROL, DIRECT: Direct LDL: 35 mg/dL

## 2020-10-15 LAB — TSH: TSH: 2.43 u[IU]/mL (ref 0.35–4.50)

## 2020-10-15 LAB — HEMOGLOBIN A1C: Hgb A1c MFr Bld: 5.3 % (ref 4.6–6.5)

## 2020-10-15 MED ORDER — PROPRANOLOL HCL 10 MG PO TABS: 10.0000 mg | ORAL_TABLET | Freq: Two times a day (BID) | ORAL | 1 refills | Status: DC | PRN

## 2020-10-15 NOTE — Assessment & Plan Note (Addendum)
Unchanged. Advised neurology consult for further evaluation and to ensure physiologic. No other symptoms to suggest PD at this time. Declines neurology consult. Trial propranolol 10mg  BID. Close follow up.

## 2020-10-15 NOTE — Assessment & Plan Note (Signed)
Uncontrolled. Discussed changing prozac to zoloft however with other  Medication changes today we opted to discuss this at follow up.

## 2020-10-15 NOTE — Progress Notes (Signed)
Subjective:    [name], female    DOB: Dec 16, 1950, 70 y.o.   MRN: [id number]  CC: Wanda Clayton is a 70 y.o. female who presents today for follow up.   HPI: Anxious about being in doctors office which she suspects aggravates tremor and raises BP.   HLD- compliant with lipitor, zetia  Depression- compliant with prozac. Not sure if helpful as been on medication for  Many years.  Caregiver for elderly parents. Sleeping well. No si/hi.  HTN- compliant with amlodipine 2.5mg . Denies exertional chest pain or pressure, numbness or tingling radiating to left arm or jaw, palpitations, dizziness, frequent headaches, changes in vision, or shortness of breath.   H/o carotid stenosis. Compliant with asa 81mg . No bleeding.  Tremor- worse with anxiety however no changes over the past couple of yeats.  tremor continues to come and goes.  Has had to falls related to slipping an falling over dogs. No problems with balance.   Smoker.      HISTORY:  Past Medical History:  Diagnosis Date   Acute pain of left foot 02/08/2020   Anxiety    Depression    Past Surgical History:  Procedure Laterality Date   carpal tunnel repair     DENTAL SURGERY     TUBAL LIGATION     VAGINAL DELIVERY     x1   Family History  Problem Relation Age of Onset   Hypertension Mother    Colon cancer Mother 17   Atrial fibrillation Mother    Heart disease Father    Diabetes Father    Colon cancer Father 42   Congestive Heart Failure Father    COPD Sister    Crohn's disease Sister    Colon cancer Sister 37   Heart disease Brother    Heart attack Sister    Breast cancer Neg Hx     Allergies: Pollen extract Current Outpatient Medications on File Prior to Visit  Medication Sig Dispense Refill   amLODipine (NORVASC) 5 MG tablet Take 0.5 tablets (2.5 mg total) by mouth daily. 90 tablet 3   aspirin 81 MG tablet Take 81 mg by mouth daily.     atorvastatin (LIPITOR) 40 MG  tablet TAKE 1 TABLET (40 MG TOTAL) BY MOUTH DAILY AT 6 PM. 90 tablet 1   ezetimibe (ZETIA) 10 MG tablet TAKE 1 TABLET BY MOUTH DAILY. PLEASE SCHEDULE OFFICE VISIT FOR FURTHER REFILLS. THANK YOU! 90 tablet 0   FLUoxetine (PROZAC) 40 MG capsule TAKE 1 CAPSULE BY MOUTH EVERY DAY 90 capsule 0   Multiple Vitamins-Minerals (EQ MULTIVITAMINS ADULT GUMMY PO) Take by mouth daily.     No current facility-administered medications on file prior to visit.    Social History   Tobacco Use   Smoking status: Current Every Day Smoker    Packs/day: 1.00    Years: 36.00    Pack years: 36.00    Types: Cigarettes   Smokeless tobacco: Never Used   Tobacco comment: she plans to quit without medication  Vaping Use   Vaping Use: Never used  Substance Use Topics   Alcohol use: Yes    Comment: Daily glass wine x2   Drug use: No    Review of Systems  Constitutional: Negative for chills and fever.  Respiratory: Negative for cough.   Cardiovascular: Negative for chest pain and palpitations.  Gastrointestinal: Negative for nausea and vomiting.  Neurological: Positive for tremors. Negative for dizziness and headaches.  Psychiatric/Behavioral: Negative for  suicidal ideas. The patient is nervous/anxious.       Objective:    BP (!) 142/82    Pulse (!) 101    Temp 98.6 F (37 C)    Ht 5\' 4"  (1.626 m)    Wt 150 lb 3.2 oz (68.1 kg)    SpO2 97%    BMI 25.78 kg/m  BP Readings from Last 3 Encounters:  10/15/20 (!) 142/82  09/05/20 132/86  06/29/20 130/71   Wt Readings from Last 3 Encounters:  10/15/20 150 lb 3.2 oz (68.1 kg)  09/05/20 147 lb (66.7 kg)  08/16/20 150 lb (68 kg)    Physical Exam Vitals reviewed.  Constitutional:      Appearance: She is well-developed and well-nourished.  Eyes:     Conjunctiva/sclera: Conjunctivae normal.  Cardiovascular:     Rate and Rhythm: Regular rhythm. Tachycardia present.     Pulses: Normal pulses.     Heart sounds: Normal heart sounds.  Pulmonary:      Effort: Pulmonary effort is normal.     Breath sounds: Normal breath sounds. No wheezing, rhonchi or rales.  Musculoskeletal:     Right lower leg: No edema.     Left lower leg: No edema.  Skin:    General: Skin is warm and dry.  Neurological:     Mental Status: She is alert.     Comments: Tremor observed in bilateral hands outstretched. Resolves with finger to nose. No cogwheeling. No shuffling gait when asked to walk across exam room.   Psychiatric:        Mood and Affect: Mood and affect normal.        Speech: Speech normal.        Behavior: Behavior normal.        Thought Content: Thought content normal.        Assessment & Plan:   Problem List Items Addressed This Visit      Cardiovascular and Mediastinum   Atherosclerosis of aorta (HCC)    Stable. Continue lipitor and zetia.      Relevant Medications   propranolol (INDERAL) 10 MG tablet   HTN (hypertension) - Primary    Uncontrolled. Increase amlodipine to 5mg . Close follow up      Relevant Medications   propranolol (INDERAL) 10 MG tablet     Other   Depression with anxiety    Uncontrolled. Discussed changing prozac to zoloft however with other  Medication changes today we opted to discuss this at follow up.       Relevant Medications   propranolol (INDERAL) 10 MG tablet   Hyperlipidemia   Relevant Medications   propranolol (INDERAL) 10 MG tablet   Other Relevant Orders   Lipid panel (Completed)   Tremor    Unchanged. Advised neurology consult for further evaluation and to ensure physiologic. No other symptoms to suggest PD at this time. Declines neurology consult. Trial propranolol 10mg  BID. Close follow up.       Relevant Medications   propranolol (INDERAL) 10 MG tablet   Other Relevant Orders   TSH (Completed)   CBC with Differential/Platelet (Completed)   Comprehensive metabolic panel (Completed)   Hemoglobin A1c (Completed)       I have discontinued Wanda Clayton's cholecalciferol and  multivitamin with minerals. I am also having her start on propranolol. Additionally, I am having her maintain her aspirin, FLUoxetine, amLODipine, atorvastatin, ezetimibe, and Multiple Vitamins-Minerals (EQ MULTIVITAMINS ADULT GUMMY PO).   Meds ordered this encounter  Medications  propranolol (INDERAL) 10 MG tablet    Sig: Take 1 tablet (10 mg total) by mouth 2 (two) times daily as needed (anxiety).    Dispense:  60 tablet    Refill:  1    Order Specific Question:   Supervising Provider    Answer:   Crecencio Mc [[id number]]    Return precautions given.   Risks, benefits, and alternatives of the medications and treatment plan prescribed today were discussed, and patient expressed understanding.   Education regarding symptom management and diagnosis given to patient on AVS.  Continue to follow with Burnard Hawthorne, FNP for routine health maintenance.   Wanda Clayton and I agreed with plan.   [name], FNP

## 2020-10-15 NOTE — Assessment & Plan Note (Signed)
Uncontrolled. Increase amlodipine to 5mg. Close follow up. °

## 2020-10-15 NOTE — Patient Instructions (Signed)
Trial of propranolol.   Please again consider referral to neurology as we discussed.   Let me know how you are doing

## 2020-10-16 ENCOUNTER — Other Ambulatory Visit: Payer: Self-pay | Admitting: Family

## 2020-10-16 DIAGNOSIS — R899 Unspecified abnormal finding in specimens from other organs, systems and tissues: Secondary | ICD-10-CM

## 2020-10-16 NOTE — Assessment & Plan Note (Signed)
Stable. Continue lipitor and zetia.

## 2020-11-09 ENCOUNTER — Other Ambulatory Visit: Payer: Self-pay | Admitting: Family

## 2020-11-09 DIAGNOSIS — F418 Other specified anxiety disorders: Secondary | ICD-10-CM

## 2020-11-09 DIAGNOSIS — I10 Essential (primary) hypertension: Secondary | ICD-10-CM

## 2020-11-09 DIAGNOSIS — R251 Tremor, unspecified: Secondary | ICD-10-CM

## 2020-11-26 ENCOUNTER — Telehealth: Payer: PPO | Admitting: Family

## 2020-11-26 ENCOUNTER — Other Ambulatory Visit: Payer: Self-pay | Admitting: Cardiovascular Disease

## 2020-11-28 ENCOUNTER — Other Ambulatory Visit: Payer: Self-pay

## 2020-11-28 ENCOUNTER — Encounter: Payer: Self-pay | Admitting: Family

## 2020-11-28 ENCOUNTER — Telehealth (INDEPENDENT_AMBULATORY_CARE_PROVIDER_SITE_OTHER): Payer: PPO | Admitting: Family

## 2020-11-28 VITALS — BP 113/67 | Ht 65.0 in | Wt 150.0 lb

## 2020-11-28 DIAGNOSIS — R748 Abnormal levels of other serum enzymes: Secondary | ICD-10-CM

## 2020-11-28 DIAGNOSIS — F411 Generalized anxiety disorder: Secondary | ICD-10-CM | POA: Diagnosis not present

## 2020-11-28 DIAGNOSIS — I10 Essential (primary) hypertension: Secondary | ICD-10-CM | POA: Diagnosis not present

## 2020-11-28 DIAGNOSIS — R251 Tremor, unspecified: Secondary | ICD-10-CM

## 2020-11-28 HISTORY — DX: Abnormal levels of other serum enzymes: R74.8

## 2020-11-28 MED ORDER — FLUOXETINE HCL 20 MG PO CAPS: 20.0000 mg | ORAL_CAPSULE | Freq: Every day | ORAL | 3 refills | Status: DC

## 2020-11-28 MED ORDER — FLUOXETINE HCL 40 MG PO CAPS: 40.0000 mg | ORAL_CAPSULE | Freq: Every day | ORAL | 3 refills | Status: DC

## 2020-11-28 NOTE — Assessment & Plan Note (Signed)
Uncontrolled. She didn't start propranolol. Will discuss at follow up

## 2020-11-28 NOTE — Assessment & Plan Note (Signed)
Uncontrolled. Suspect anxiety contributing to tremor. Increase prozac to 60mg .

## 2020-11-28 NOTE — Patient Instructions (Signed)
Take amlodipine at night  Increase prozac 60mg .

## 2020-11-28 NOTE — Progress Notes (Signed)
Virtual Visit via Video Note  I connected with@  on 11/28/20 at 11:00 AM EST by a video enabled telemedicine application and verified that I am speaking with the correct person using two identifiers.  Location patient: home Location provider:work  Persons participating in the virtual visit: patient, provider  I discussed the limitations of evaluation and management by telemedicine and the availability of in person appointments. The patient expressed understanding and agreed to proceed.   HPI: Follow up   Anxiety-No depression. 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 Sleeping well.   HTN- compliant with amlodipine 5mg  qam. At home BP 113/67, 98/68, HR 92, 115, 101, 88.Average 90s.  She is smoker. No fever.   TSH normal 10/15/20.   Liver enzymes elevated. Drinks 3-4 glasses of wine. No tylenol.   Tremor- She hasnt started propranolol 10mg  bid . She gets lightheaded when getting out of bed in the morning and noticeable after taken medications.   No vertigo, syncope, HA, CP,sob.    HLD- compliant with lipitor 40mg .   ROS: See pertinent positives and negatives per HPI.    EXAM:  VITALS per patient if applicable: BP 676/19   Ht 5\' 5"  (1.651 m)   Wt 150 lb (68 kg)   BMI 24.96 kg/m  BP Readings from Last 3 Encounters:  11/28/20 113/67  10/15/20 (!) 142/82  09/05/20 132/86   Wt Readings from Last 3 Encounters:  11/28/20 150 lb (68 kg)  10/15/20 150 lb 3.2 oz (68.1 kg)  09/05/20 147 lb (66.7 kg)    GENERAL: alert, oriented, appears well and in no acute distress  HEENT: atraumatic, conjunttiva clear, no obvious abnormalities on inspection of external nose and ears  NECK: normal movements of the head and neck  LUNGS: on inspection no signs of respiratory distress, breathing rate appears normal, no obvious gross SOB, gasping or wheezing  CV: no obvious cyanosis  MS: moves all visible extremities without noticeable  abnormality  PSYCH/NEURO: pleasant and cooperative, no obvious depression or anxiety, speech and thought processing grossly intact  ASSESSMENT AND PLAN:  Discussed the following assessment and plan:  Problem List Items Addressed This Visit      Cardiovascular and Mediastinum   HTN (hypertension)    Controlled. Continue amlodipine 5mg         Other   Elevated liver enzymes    Advised to refrain from all alcohol. Repeat liver enzymes 1-2 weeks.       GAD (generalized anxiety disorder) - Primary    Uncontrolled. Suspect anxiety contributing to tremor. Increase prozac to 60mg .       Relevant Medications   FLUoxetine (PROZAC) 20 MG capsule   FLUoxetine (PROZAC) 40 MG capsule   Tremor    Uncontrolled. She didn't start propranolol. Will discuss at follow up         -we discussed possible serious and likely etiologies, options for evaluation and workup, limitations of telemedicine visit vs in person visit, treatment, treatment risks and precautions. Pt prefers to treat via telemedicine empirically rather then risking or undertaking an in person visit at this moment.  .   I discussed the assessment and treatment plan with the patient. The patient was provided an opportunity to ask questions and all were answered. The patient agreed with the plan and demonstrated an understanding of the instructions.   The patient was advised to call back or seek an in-person evaluation if the symptoms worsen or if the condition fails to improve  as anticipated.   [name], FNP

## 2020-11-28 NOTE — Assessment & Plan Note (Signed)
Advised to refrain from all alcohol. Repeat liver enzymes 1-2 weeks.

## 2020-11-28 NOTE — Assessment & Plan Note (Signed)
Controlled. Continue amlodipine 5mg 

## 2020-12-06 ENCOUNTER — Other Ambulatory Visit: Payer: Self-pay | Admitting: Family

## 2020-12-06 DIAGNOSIS — F418 Other specified anxiety disorders: Secondary | ICD-10-CM

## 2020-12-06 DIAGNOSIS — I10 Essential (primary) hypertension: Secondary | ICD-10-CM

## 2020-12-06 DIAGNOSIS — R251 Tremor, unspecified: Secondary | ICD-10-CM

## 2020-12-10 ENCOUNTER — Other Ambulatory Visit: Payer: Self-pay

## 2020-12-10 ENCOUNTER — Other Ambulatory Visit (INDEPENDENT_AMBULATORY_CARE_PROVIDER_SITE_OTHER): Payer: PPO

## 2020-12-10 DIAGNOSIS — R899 Unspecified abnormal finding in specimens from other organs, systems and tissues: Secondary | ICD-10-CM

## 2020-12-10 LAB — COMPREHENSIVE METABOLIC PANEL
ALT: 29 U/L (ref 0–35)
AST: 37 U/L (ref 0–37)
Albumin: 3.9 g/dL (ref 3.5–5.2)
Alkaline Phosphatase: 106 U/L (ref 39–117)
BUN: 10 mg/dL (ref 6–23)
CO2: 28 mEq/L (ref 19–32)
Calcium: 9.3 mg/dL (ref 8.4–10.5)
Chloride: 101 mEq/L (ref 96–112)
Creatinine, Ser: 0.7 mg/dL (ref 0.40–1.20)
GFR: 88.1 mL/min (ref >60.00)
Glucose, Bld: 85 mg/dL (ref 70–99)
Potassium: 4.7 mEq/L (ref 3.5–5.1)
Sodium: 138 mEq/L (ref 135–145)
Total Bilirubin: 0.6 mg/dL (ref 0.2–1.2)
Total Protein: 6.6 g/dL (ref 6.0–8.3)

## 2021-01-29 IMAGING — MG DIGITAL SCREENING BILAT W/ TOMO
6 of 12 series · 6 of 36 positions shown · non-contrast
Comparison: Previous exam(s).

CLINICAL DATA: Screening.

EXAM:
DIGITAL SCREENING BILATERAL MAMMOGRAM WITH TOMO AND CAD

[R MLO synth-2D (1 of 2)]
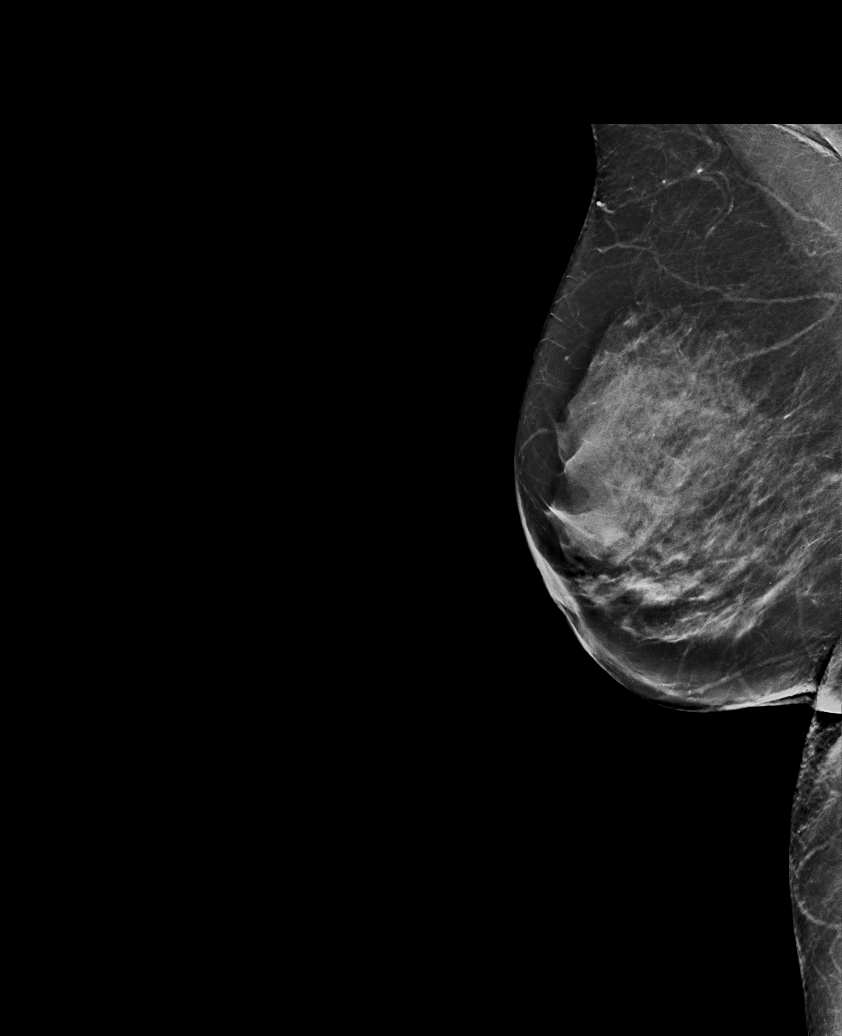

[L MLO synth-2D (1 of 2)]
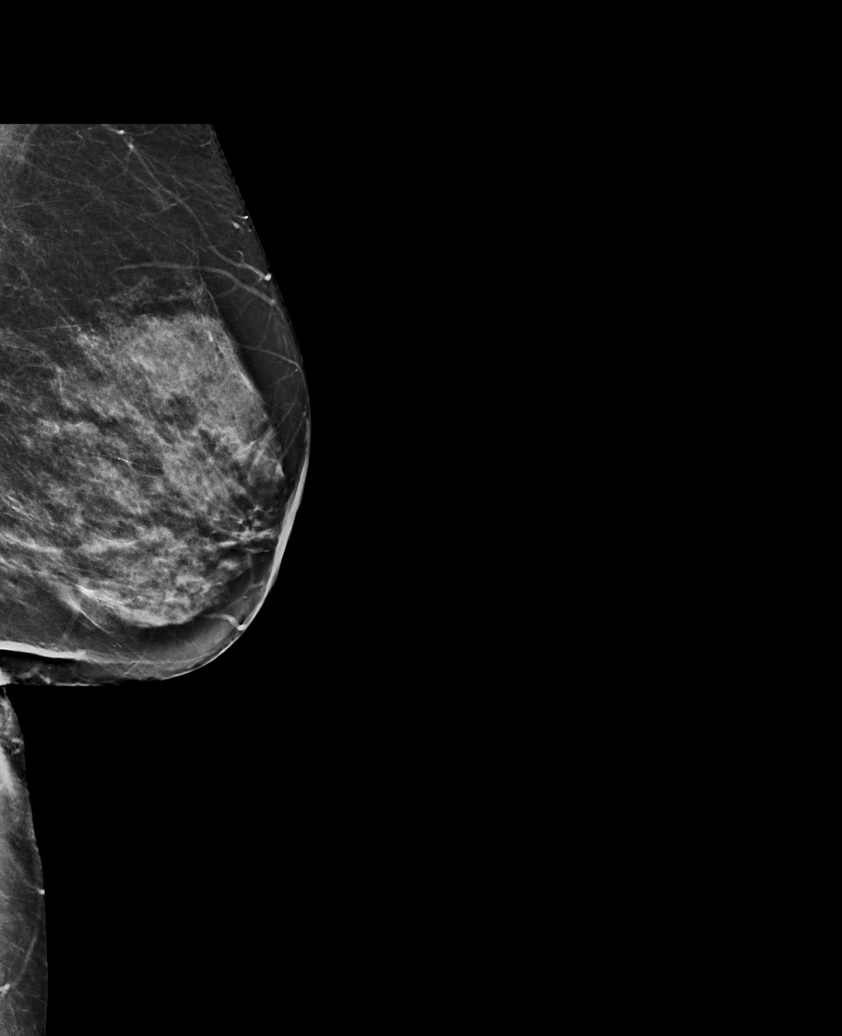

[L MLO synth-2D (2 of 2)]
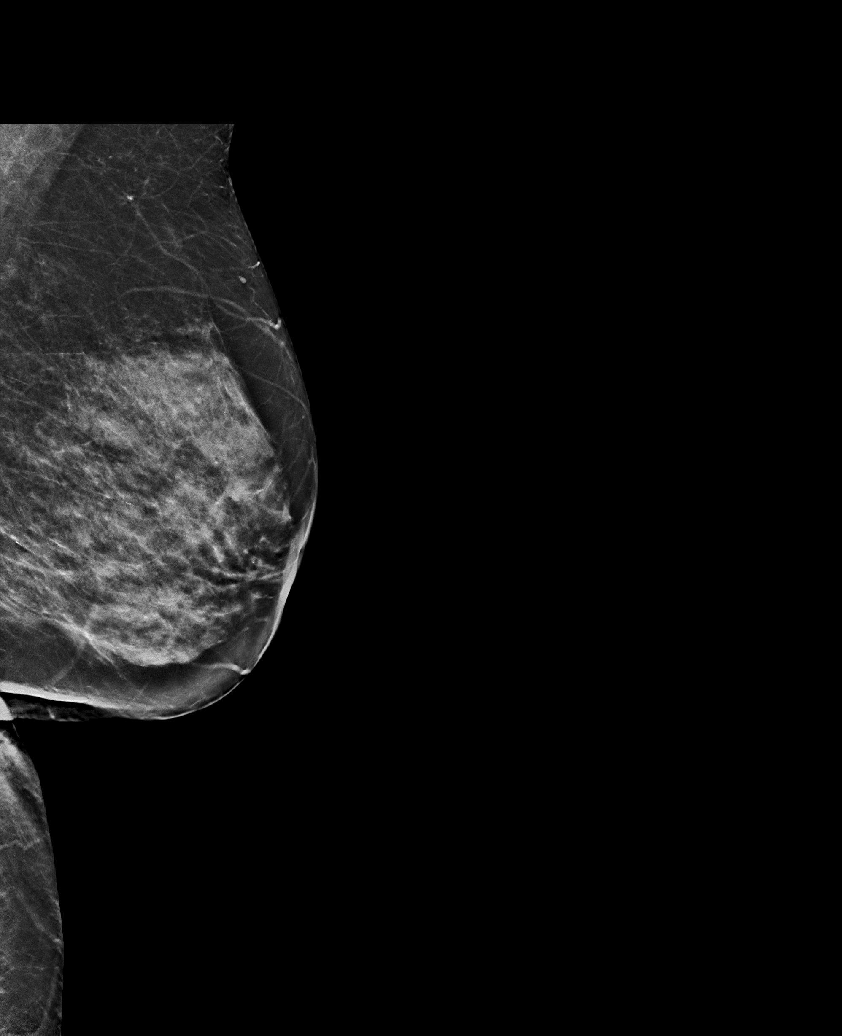

[R CC synth-2D]
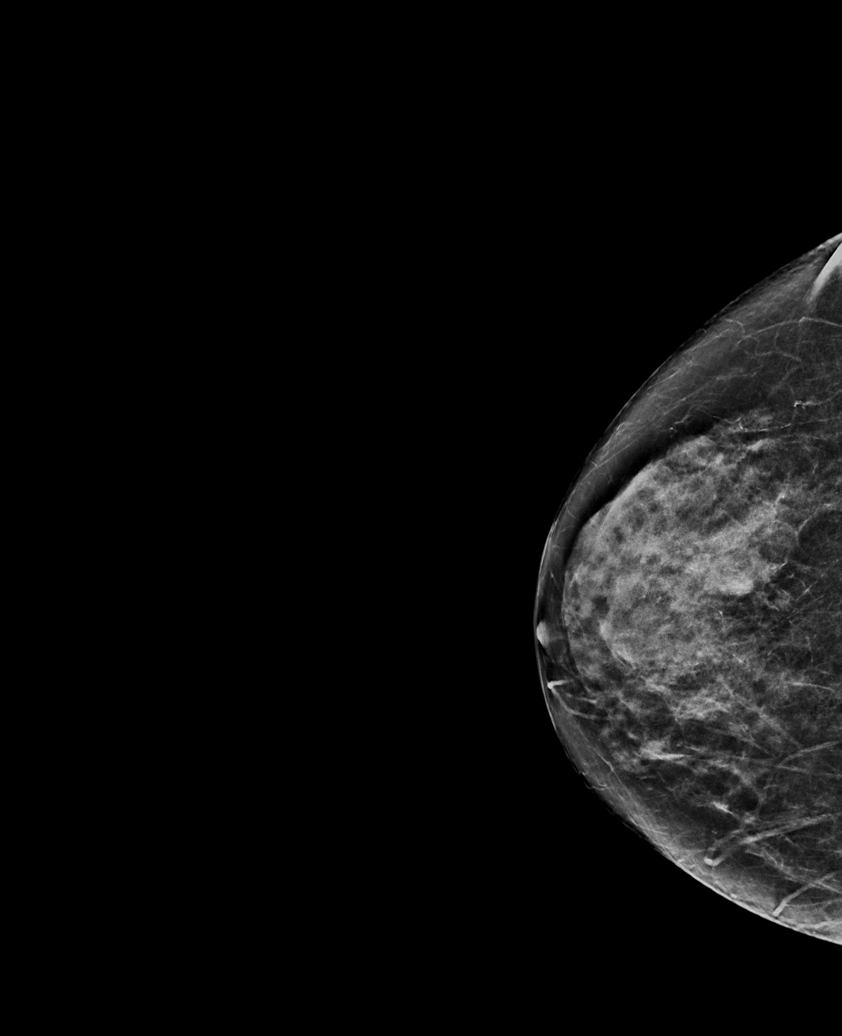

[R MLO synth-2D (2 of 2)]
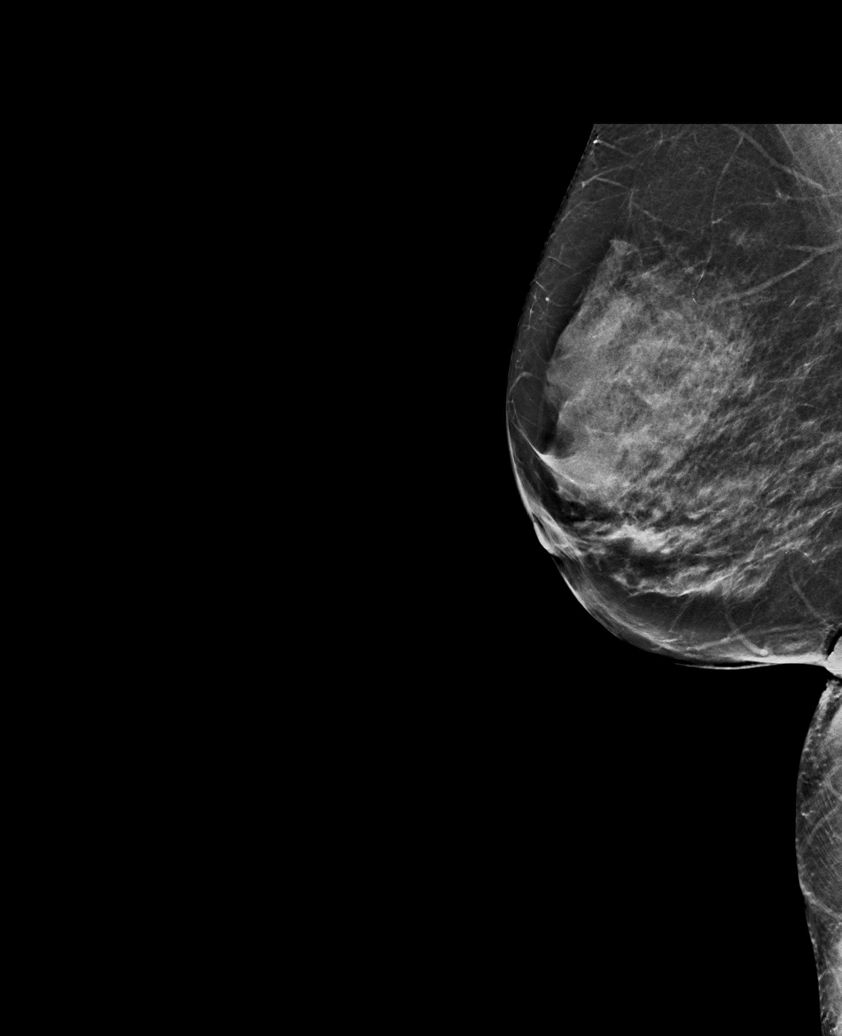

[L CC synth-2D]
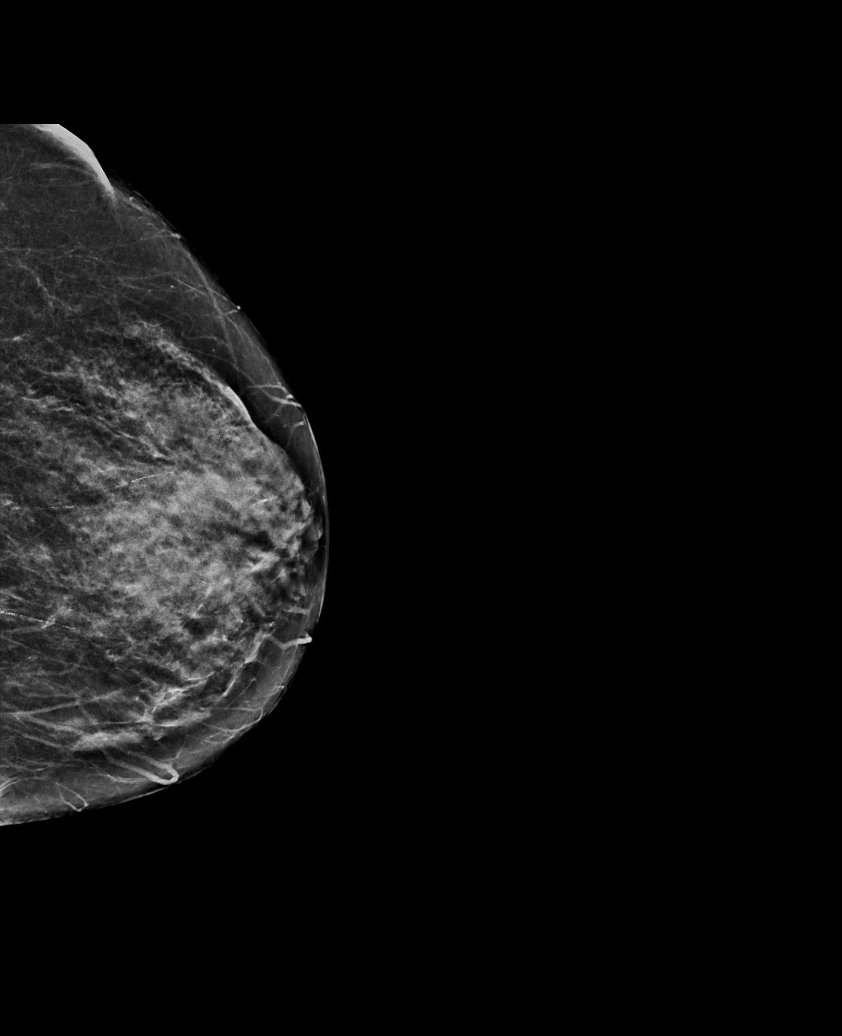

[6 of 36 positions shown; findings below may reference images not displayed]

ACR Breast Density Category d: The breast tissue is extremely dense,
which lowers the sensitivity of mammography
FINDINGS: There are no findings suspicious for malignancy. Images were
processed with CAD.
IMPRESSION: No mammographic evidence of malignancy. A result letter of this
screening mammogram will be mailed directly to the patient.

RECOMMENDATION:
Screening mammogram in one year. (Code:WO-0-ZI0)

BI-RADS CATEGORY  1: Negative.

## 2021-02-05 ENCOUNTER — Other Ambulatory Visit: Payer: Self-pay

## 2021-02-05 ENCOUNTER — Ambulatory Visit (INDEPENDENT_AMBULATORY_CARE_PROVIDER_SITE_OTHER): Payer: PPO | Admitting: Family

## 2021-02-05 ENCOUNTER — Encounter: Payer: Self-pay | Admitting: Family

## 2021-02-05 DIAGNOSIS — I700 Atherosclerosis of aorta: Secondary | ICD-10-CM | POA: Diagnosis not present

## 2021-02-05 DIAGNOSIS — R251 Tremor, unspecified: Secondary | ICD-10-CM | POA: Diagnosis not present

## 2021-02-05 DIAGNOSIS — F411 Generalized anxiety disorder: Secondary | ICD-10-CM

## 2021-02-05 DIAGNOSIS — I10 Essential (primary) hypertension: Secondary | ICD-10-CM

## 2021-02-05 NOTE — Assessment & Plan Note (Signed)
Unchanged. Declines medication or further evaluation. Will follow.

## 2021-02-05 NOTE — Assessment & Plan Note (Signed)
Stable, returned to baseline from prior. Continue prozac 40mg 

## 2021-02-05 NOTE — Assessment & Plan Note (Signed)
Controlled. Continue amlodipine 5mg 

## 2021-02-05 NOTE — Assessment & Plan Note (Signed)
Stable. Follows with Dr Rockey Situ. Continue lipitor 40mg , zetia 10mg 

## 2021-02-05 NOTE — Progress Notes (Signed)
Subjective:    [name], female    DOB: [birth year], 70 y.o.   MRN: [id number]  CC: Wanda Clayton is a 70 y.o. female who presents today for follow up.   HPI: Feels well today No complaints  HLD- compliant with lipitor 40mg , zetia 10mg   Anxiety- improved. She didn't feel the need to increase prozac and has remained on 40mg .  She continues to care for aging parents which is primary stressor.  Sleeping well.   HTN- compliant with amlodipine 5mg  qam.  No cp, sob.   Tremor-bilateral hands.  unchanged. Worse when anxious. She doesn't feel that interferes with quality of life nor requires medication at this time.     HISTORY:  Past Medical History:  Diagnosis Date  . Acute pain of left foot 02/08/2020  . Anxiety   . Depression    Past Surgical History:  Procedure Laterality Date  . carpal tunnel repair    . DENTAL SURGERY    . TUBAL LIGATION    . VAGINAL DELIVERY     x1   Family History  Problem Relation Age of Onset  . Hypertension Mother   . Colon cancer Mother 18  . Atrial fibrillation Mother   . Heart disease Father   . Diabetes Father   . Colon cancer Father 24  . Congestive Heart Failure Father   . COPD Sister   . Crohn's disease Sister   . Colon cancer Sister 93  . Heart disease Brother   . Heart attack Sister   . Breast cancer Neg Hx     Allergies: Pollen extract Current Outpatient Medications on File Prior to Visit  Medication Sig Dispense Refill  . amLODipine (NORVASC) 5 MG tablet Take 0.5 tablets (2.5 mg total) by mouth daily. (Patient taking differently: Take 5 mg by mouth daily.) 90 tablet 3  . aspirin 81 MG tablet Take 81 mg by mouth daily.    Marland Kitchen atorvastatin (LIPITOR) 40 MG tablet TAKE 1 TABLET (40 MG TOTAL) BY MOUTH DAILY AT 6 PM. 90 tablet 1  . ezetimibe (ZETIA) 10 MG tablet TAKE 1 TABLET BY MOUTH DAILY. PLEASE SCHEDULE OFFICE VISIT FOR FURTHER REFILLS. THANK YOU! 90 tablet 2  . FLUoxetine (PROZAC) 20 MG capsule Take 1 capsule (20  mg total) by mouth daily. 90 capsule 3  . FLUoxetine (PROZAC) 40 MG capsule Take 1 capsule (40 mg total) by mouth daily. 90 capsule 3  . Multiple Vitamins-Minerals (EQ MULTIVITAMINS ADULT GUMMY PO) Take by mouth daily.     No current facility-administered medications on file prior to visit.    Social History   Tobacco Use  . Smoking status: Current Every Day Smoker    Packs/day: 1.00    Years: 36.00    Pack years: 36.00    Types: Cigarettes  . Smokeless tobacco: Never Used  . Tobacco comment: she plans to quit without medication  Vaping Use  . Vaping Use: Never used  Substance Use Topics  . Alcohol use: Yes    Comment: Daily glass wine x2  . Drug use: No    Review of Systems  Constitutional: Negative for chills and fever.  Respiratory: Negative for cough.   Cardiovascular: Negative for chest pain and palpitations.  Gastrointestinal: Negative for nausea and vomiting.  Neurological: Positive for tremors.  Psychiatric/Behavioral: The patient is nervous/anxious.       Objective:    BP 124/70   Pulse (!) 108   Temp 98 F (36.7 C)  Ht 5\' 5"  (1.651 m)   Wt 147 lb (66.7 kg)   SpO2 96%   BMI 24.46 kg/m  BP Readings from Last 3 Encounters:  02/05/21 124/70  11/28/20 113/67  10/15/20 (!) 142/82   Wt Readings from Last 3 Encounters:  02/05/21 147 lb (66.7 kg)  11/28/20 150 lb (68 kg)  10/15/20 150 lb 3.2 oz (68.1 kg)    Physical Exam Vitals reviewed.  Constitutional:      Appearance: She is well-developed.  Eyes:     Conjunctiva/sclera: Conjunctivae normal.  Cardiovascular:     Rate and Rhythm: Normal rate and regular rhythm.     Pulses: Normal pulses.     Heart sounds: Normal heart sounds.  Pulmonary:     Effort: Pulmonary effort is normal.     Breath sounds: Normal breath sounds. No wheezing, rhonchi or rales.  Skin:    General: Skin is warm and dry.  Neurological:     Mental Status: She is alert.     Comments: Fine tremor bilateral hands  Psychiatric:         Speech: Speech normal.        Behavior: Behavior normal.        Thought Content: Thought content normal.        Assessment & Plan:   Problem List Items Addressed This Visit      Cardiovascular and Mediastinum   Atherosclerosis of aorta (HCC)    Stable. Follows with Dr Rockey Situ. Continue lipitor 40mg , zetia 10mg        HTN (hypertension)    Controlled. Continue amlodipine 5mg         Other   GAD (generalized anxiety disorder)    Stable, returned to baseline from prior. Continue prozac 40mg       Tremor    Unchanged. Declines medication or further evaluation. Will follow.           I am having Wanda Clayton maintain her aspirin, amLODipine, atorvastatin, Multiple Vitamins-Minerals (EQ MULTIVITAMINS ADULT GUMMY PO), ezetimibe, FLUoxetine, and FLUoxetine.   No orders of the defined types were placed in this encounter.   Return precautions given.   Risks, benefits, and alternatives of the medications and treatment plan prescribed today were discussed, and patient expressed understanding.   Education regarding symptom management and diagnosis given to patient on AVS.  Continue to follow with Burnard Hawthorne, FNP for routine health maintenance.   Wanda Clayton and I agreed with plan.   [name], FNP

## 2021-02-15 ENCOUNTER — Other Ambulatory Visit: Payer: Self-pay | Admitting: Family

## 2021-02-15 DIAGNOSIS — E785 Hyperlipidemia, unspecified: Secondary | ICD-10-CM

## 2021-02-19 ENCOUNTER — Other Ambulatory Visit: Payer: Self-pay | Admitting: Family

## 2021-02-19 DIAGNOSIS — I10 Essential (primary) hypertension: Secondary | ICD-10-CM

## 2021-02-28 ENCOUNTER — Ambulatory Visit: Payer: PPO

## 2021-03-12 ENCOUNTER — Ambulatory Visit (INDEPENDENT_AMBULATORY_CARE_PROVIDER_SITE_OTHER): Payer: PPO

## 2021-03-12 VITALS — Ht 65.0 in | Wt 147.0 lb

## 2021-03-12 DIAGNOSIS — Z0000 Encounter for general adult medical examination without abnormal findings: Secondary | ICD-10-CM | POA: Diagnosis not present

## 2021-03-12 NOTE — Progress Notes (Addendum)
Subjective:   Wanda Clayton is a 70 y.o. female who presents for Medicare Annual (Subsequent) preventive examination.  Review of Systems    No ROS.  Medicare Wellness Virtual Visit.  Visual/audio telehealth visit, UTA vital signs.   See social history for additional risk factors.   Cardiac Risk Factors include: advanced age (>49men, >49 women);hypertension     Objective:    Today's Vitals   03/12/21 1033  Weight: 147 lb (66.7 kg)  Height: 5\' 5"  (1.651 m)   Body mass index is 24.46 kg/m.  Advanced Directives 03/12/2021 02/28/2020 10/01/2019 10/11/2018 10/07/2017  Does Patient Have a Medical Advance Directive? Yes Yes No Yes Yes  Type of Paramedic of [place];Living will - Living will;Healthcare Power of [place];Living will  Does patient want to make changes to medical advance directive? No - Patient declined No - Patient declined - No - Patient declined -  Copy of [place] in Chart? No - copy requested No - copy requested - No - copy requested No - copy requested    Current Medications (verified) Outpatient Encounter Medications as of 03/12/2021  Medication Sig  . amLODipine (NORVASC) 5 MG tablet TAKE 1/2 TABLET BY MOUTH DAILY  . aspirin 81 MG tablet Take 81 mg by mouth daily.  Marland Kitchen atorvastatin (LIPITOR) 40 MG tablet TAKE 1 TABLET (40 MG TOTAL) BY MOUTH DAILY AT 6 PM.  . ezetimibe (ZETIA) 10 MG tablet TAKE 1 TABLET BY MOUTH DAILY. PLEASE SCHEDULE OFFICE VISIT FOR FURTHER REFILLS. THANK YOU!  . FLUoxetine (PROZAC) 20 MG capsule Take 1 capsule (20 mg total) by mouth daily.  Marland Kitchen FLUoxetine (PROZAC) 40 MG capsule Take 1 capsule (40 mg total) by mouth daily.  . Multiple Vitamins-Minerals (EQ MULTIVITAMINS ADULT GUMMY PO) Take by mouth daily.   No facility-administered encounter medications on file as of 03/12/2021.    Allergies (verified) Pollen extract   History: Past Medical  History:  Diagnosis Date  . Acute pain of left foot 02/08/2020  . Anxiety   . Depression    Past Surgical History:  Procedure Laterality Date  . carpal tunnel repair    . DENTAL SURGERY    . TUBAL LIGATION    . VAGINAL DELIVERY     x1   Family History  Problem Relation Age of Onset  . Hypertension Mother   . Colon cancer Mother 28  . Atrial fibrillation Mother   . Heart disease Father   . Diabetes Father   . Colon cancer Father 76  . Congestive Heart Failure Father   . COPD Sister   . Crohn's disease Sister   . Colon cancer Sister 49  . Heart disease Brother   . Heart attack Sister   . Breast cancer Neg Hx    Social History   Socioeconomic History  . Marital status: Married    Spouse name: Not on file  . Number of children: 1  . Years of education: Not on file  . Highest education level: Not on file  Occupational History    Employer: [employer]  Tobacco Use  . Smoking status: Current Every Day Smoker    Packs/day: 1.00    Years: 36.00    Pack years: 36.00    Types: Cigarettes  . Smokeless tobacco: Never Used  . Tobacco comment: she plans to quit without medication  Vaping Use  . Vaping Use: Never used  Substance and Sexual  Activity  . Alcohol use: Yes    Comment: Daily glass wine x2  . Drug use: No  . Sexual activity: Not Currently    Birth control/protection: None, Post-menopausal  Other Topics Concern  . Not on file  Social History Narrative   Works for [employer], retired 06/2016   Married.   Daughter and granddaughter.          Social Determinants of Health   Financial Resource Strain: Low Risk   . Difficulty of Paying Living Expenses: Not hard at all  Food Insecurity: No Food Insecurity  . Worried About Charity fundraiser in the Last Year: Never true  . Ran Out of Food in the Last Year: Never true  Transportation Needs: No Transportation Needs  . Lack of Transportation (Medical): No  . Lack of Transportation (Non-Medical): No   Physical Activity: Not on file  Stress: No Stress Concern Present  . Feeling of Stress : Only a little  Social Connections: Unknown  . Frequency of Communication with Friends and Family: Not on file  . Frequency of Social Gatherings with Friends and Family: Not on file  . Attends Religious Services: Not on file  . Active Member of Clubs or Organizations: Not on file  . Attends Archivist Meetings: Not on file  . Marital Status: Married    Tobacco Counseling Ready to quit: Not Answered Counseling given: Not Answered Comment: she plans to quit without medication   Clinical Intake:  Pre-visit preparation completed: Yes        Diabetes: No  How often do you need to have someone help you when you read instructions, pamphlets, or other written materials from your doctor or pharmacy?: 1 - Never    Interpreter Needed?: No      Activities of Daily Living In your present state of health, do you have any difficulty performing the following activities: 03/12/2021  Hearing? Y  Comment Hearing aids  Vision? N  Difficulty concentrating or making decisions? N  Walking or climbing stairs? Y  Comment Chronic back pain  Dressing or bathing? N  Doing errands, shopping? N  Preparing Food and eating ? N  Using the Toilet? N  In the past six months, have you accidently leaked urine? N  Do you have problems with loss of bowel control? N  Managing your Medications? N  Managing your Finances? N  Housekeeping or managing your Housekeeping? N  Some recent data might be hidden    Patient Care Team: Burnard Hawthorne, FNP as PCP - General (Family Medicine)  Indicate any recent Medical Services you may have received from other than [facility] providers in the past year (date may be approximate).     Assessment:   This is a routine wellness examination for Wanda Clayton.  I connected with Wanda Clayton today by telephone and verified that I am speaking with the correct person using two  identifiers. Location patient: home Location provider: work Persons participating in the virtual visit: patient, Marine scientist.    I discussed the limitations, risks, security and privacy concerns of performing an evaluation and management service by telephone and the availability of in person appointments. The patient expressed understanding and verbally consented to this telephonic visit.    Interactive audio and video telecommunications were attempted between this provider and patient, however failed, due to patient having technical difficulties OR patient did not have access to video capability.  We continued and completed visit with audio only.  Some vital signs may  be absent or patient reported.   Hearing/Vision screen  Hearing Screening   125Hz  250Hz  500Hz  1000Hz  2000Hz  3000Hz  4000Hz  6000Hz  8000Hz   Right ear:           Left ear:           Comments: Followed by [facility]  Hearing aid, bilateral  Vision Screening Comments: Followed by Dr. Gloriann Loan  Wears corrective lenses  Visual acuity not assessed per patient preference since they have regular follow up with the ophthalmologist  Dietary issues and exercise activities discussed: Current Exercise Habits: Home exercise routine, Intensity: Mild  Low sodium diet Good water intake  Goals Addressed              This Visit's Progress     Patient Stated   .  Weight goal 135-140lb (pt-stated)                Depression Screen PHQ 2/9 Scores 03/12/2021 11/28/2020 06/29/2020 02/28/2020 11/30/2019 03/02/2019 10/11/2018  PHQ - 2 Score 0 0 0 0 2 1 0  PHQ- 9 Score - - 1 - 4 2 -    Fall Risk Fall Risk  03/12/2021 11/28/2020 02/28/2020 02/08/2020 11/30/2019  Falls in the past year? 0 1 (No Data) 1 1  Comment - - None since last reported 2 weeks ago - -  Number falls in past yr: 0 0 - 1 0  Injury with Fall? 0 1 - 1 1  Risk for fall due to : - - - History of fall(s) History of fall(s)  Follow up Falls evaluation completed Falls evaluation  completed Falls evaluation completed Falls evaluation completed Falls evaluation completed    [place]: Handrails in use when climbing stairs?Yes Home free of loose throw rugs in walkways, pet beds, electrical cords, etc? Yes  Adequate lighting in your home to reduce risk of falls? Yes   ASSISTIVE DEVICES UTILIZED TO PREVENT FALLS: Life alert? No  Use of a cane, walker or w/c? No   TIMED UP AND GO: Was the test performed? No .   Cognitive Function: Patient is alert and oriented x3.  Enjoys brain health games and activity. MMSE/6CIT declined.   MMSE - Mini Mental State Exam 10/07/2017  Orientation to time 5  Orientation to Place 5  Registration 3  Attention/ Calculation 5  Recall 3  Language- name 2 objects 2  Language- repeat 1  Language- follow 3 step command 3  Language- read & follow direction 1  Write a sentence 1  Copy design 1  Total score 30     6CIT Screen 02/28/2020 10/11/2018  What Year? 0 points 0 points  What month? 0 points 0 points  What time? 0 points 0 points  Count back from 20 - 0 points  Months in reverse - 0 points  Repeat phrase - 0 points  Total Score - 0    Immunizations Immunization History  Administered Date(s) Administered  . PFIZER(Purple Top)SARS-COV-2 Vaccination 01/06/2020, 01/27/2020  . Td 02/13/2016   Health Maintenance Health Maintenance  Topic Date Due  . Zoster Vaccines- Shingrix (1 of 2) 06/12/2021 (Originally 04/14/2001)  . COVID-19 Vaccine (3 - Booster for Pfizer series) 09/09/2021 (Originally 06/28/2020)  . INFLUENZA VACCINE  05/13/2021  . COLONOSCOPY (Pts 45-8yrs Insurance coverage will need to be confirmed)  07/22/2021  . MAMMOGRAM  08/14/2022  . TETANUS/TDAP  02/12/2026  . DEXA SCAN  Completed  . Hepatitis C Screening  Completed  . HPV VACCINES  Aged Out  . PNA vac Low Risk Adult  Discontinued   Colorectal cancer screening: Type of screening: Colonoscopy. Completed 07/23/11.  Repeat every 10 years  Mammogram status: Completed 08/14/20. Repeat every year   Lung Cancer Screening: completed 08/16/20.  Vision Screening: Recommended annual ophthalmology exams for early detection of glaucoma and other disorders of the eye. Is the patient up to date with their annual eye exam?  Yes   Dental Screening: Recommended annual dental exams for proper oral hygiene. Visits every 4 months.   Community Resource Referral / Chronic Care Management: CRR required this visit?  No   CCM required this visit?  No      Plan:    Keep all routine maintenance appointments.   I have personally reviewed and noted the following in the patient's chart:   . Medical and social history . Use of alcohol, tobacco or illicit drugs  . Current medications and supplements including opioid prescriptions. Patient is not currently taking opioid.  . Functional ability and status . Nutritional status . Physical activity . Advanced directives . List of other physicians . Hospitalizations, surgeries, and ER visits in previous 12 months . Vitals . Screenings to include cognitive, depression, and falls . Referrals and appointments  In addition, I have reviewed and discussed with patient certain preventive protocols, quality metrics, and best practice recommendations. A written personalized care plan for preventive services as well as general preventive health recommendations were provided to patient via mychart.     Varney Biles, LPN   [id number]/61/9509    Agree with plan. [name], NP

## 2021-03-12 NOTE — Patient Instructions (Addendum)
Wanda Clayton , Thank you for taking time to come for your Medicare Wellness Visit. I appreciate your ongoing commitment to your health goals. Please review the following plan we discussed and let me know if I can assist you in the future.   These are the goals we discussed: Goals      Patient Stated   .  Quit smoking (pt-stated)      Plans to decrease/stop smoking soon when life stressors settle a bit more    .  Weight goal 135-140lb (pt-stated)               This is a list of the screening recommended for you and due dates:  Health Maintenance  Topic Date Due  . Zoster (Shingles) Vaccine (1 of 2) 06/12/2021*  . COVID-19 Vaccine (3 - Booster for Pfizer series) 09/09/2021*  . Flu Shot  05/13/2021  . Colon Cancer Screening  07/22/2021  . Mammogram  08/14/2022  . Tetanus Vaccine  02/12/2026  . DEXA scan (bone density measurement)  Completed  . Hepatitis C Screening: USPSTF Recommendation to screen - Ages 30-79 yo.  Completed  . HPV Vaccine  Aged Out  . Pneumonia vaccines  Discontinued  *Topic was postponed. The date shown is not the original due date.    Immunizations Immunization History  Administered Date(s) Administered  . PFIZER(Purple Top)SARS-COV-2 Vaccination 01/06/2020, 01/27/2020  . Td 02/13/2016   Advanced directives: End of life planning; Advance aging; Advanced directives discussed.  Copy of current HCPOA/Living Will requested.    Conditions/risks identified: none new.   Follow up in one year for your annual wellness visit.   Preventive Care 25 Years and Older, Female Preventive care refers to lifestyle choices and visits with your health care provider that can promote health and wellness. What does preventive care include?  A yearly physical exam. This is also called an annual well check.  Dental exams once or twice a year.  Routine eye exams. Ask your health care provider how often you should have your eyes checked.  Personal lifestyle choices,  including:  Daily care of your teeth and gums.  Regular physical activity.  Eating a healthy diet.  Avoiding tobacco and drug use.  Limiting alcohol use.  Practicing safe sex.  Taking low-dose aspirin every day.  Taking vitamin and mineral supplements as recommended by your health care provider. What happens during an annual well check? The services and screenings done by your health care provider during your annual well check will depend on your age, overall health, lifestyle risk factors, and family history of disease. Counseling  Your health care provider may ask you questions about your:  Alcohol use.  Tobacco use.  Drug use.  Emotional well-being.  Home and relationship well-being.  Sexual activity.  Eating habits.  History of falls.  Memory and ability to understand (cognition).  Work and work Statistician.  Reproductive health. Screening  You may have the following tests or measurements:  Height, weight, and BMI.  Blood pressure.  Lipid and cholesterol levels. These may be checked every 5 years, or more frequently if you are over 54 years old.  Skin check.  Lung cancer screening. You may have this screening every year starting at age 50 if you have a 30-pack-year history of smoking and currently smoke or have quit within the past 15 years.  Fecal occult blood test (FOBT) of the stool. You may have this test every year starting at age 72.  Flexible sigmoidoscopy or colonoscopy.  You may have a sigmoidoscopy every 5 years or a colonoscopy every 10 years starting at age 110.  Hepatitis C blood test.  Hepatitis B blood test.  Sexually transmitted disease (STD) testing.  Diabetes screening. This is done by checking your blood sugar (glucose) after you have not eaten for a while (fasting). You may have this done every 1-3 years.  Bone density scan. This is done to screen for osteoporosis. You may have this done starting at age 79.  Mammogram. This  may be done every 1-2 years. Talk to your health care provider about how often you should have regular mammograms. Talk with your health care provider about your test results, treatment options, and if necessary, the need for more tests. Vaccines  Your health care provider may recommend certain vaccines, such as:  Influenza vaccine. This is recommended every year.  Tetanus, diphtheria, and acellular pertussis (Tdap, Td) vaccine. You may need a Td booster every 10 years.  Zoster vaccine. You may need this after age 41.  Pneumococcal 13-valent conjugate (PCV13) vaccine. One dose is recommended after age 60.  Pneumococcal polysaccharide (PPSV23) vaccine. One dose is recommended after age 45. Talk to your health care provider about which screenings and vaccines you need and how often you need them. This information is not intended to replace advice given to you by your health care provider. Make sure you discuss any questions you have with your health care provider. Document Released: 10/26/2015 Document Revised: 06/18/2016 Document Reviewed: 07/31/2015 Elsevier Interactive Patient Education  2017 [employer] Prevention in the Home Falls can cause injuries. They can happen to people of all ages. There are many things you can do to make your home safe and to help prevent falls. What can I do on the outside of my home?  Regularly fix the edges of walkways and driveways and fix any cracks.  Remove anything that might make you trip as you walk through a door, such as a raised step or threshold.  Trim any bushes or trees on the path to your home.  Use bright outdoor lighting.  Clear any walking paths of anything that might make someone trip, such as rocks or tools.  Regularly check to see if handrails are loose or broken. Make sure that both sides of any steps have handrails.  Any raised decks and porches should have guardrails on the edges.  Have any leaves, snow, or ice cleared  regularly.  Use sand or salt on walking paths during winter.  Clean up any spills in your garage right away. This includes oil or grease spills. What can I do in the bathroom?  Use night lights.  Install grab bars by the toilet and in the tub and shower. Do not use towel bars as grab bars.  Use non-skid mats or decals in the tub or shower.  If you need to sit down in the shower, use a plastic, non-slip stool.  Keep the floor dry. Clean up any water that spills on the floor as soon as it happens.  Remove soap buildup in the tub or shower regularly.  Attach bath mats securely with double-sided non-slip rug tape.  Do not have throw rugs and other things on the floor that can make you trip. What can I do in the bedroom?  Use night lights.  Make sure that you have a light by your bed that is easy to reach.  Do not use any sheets or blankets that are too big  for your bed. They should not hang down onto the floor.  Have a firm chair that has side arms. You can use this for support while you get dressed.  Do not have throw rugs and other things on the floor that can make you trip. What can I do in the kitchen?  Clean up any spills right away.  Avoid walking on wet floors.  Keep items that you use a lot in easy-to-reach places.  If you need to reach something above you, use a strong step stool that has a grab bar.  Keep electrical cords out of the way.  Do not use floor polish or wax that makes floors slippery. If you must use wax, use non-skid floor wax.  Do not have throw rugs and other things on the floor that can make you trip. What can I do with my stairs?  Do not leave any items on the stairs.  Make sure that there are handrails on both sides of the stairs and use them. Fix handrails that are broken or loose. Make sure that handrails are as long as the stairways.  Check any carpeting to make sure that it is firmly attached to the stairs. Fix any carpet that is loose  or worn.  Avoid having throw rugs at the top or bottom of the stairs. If you do have throw rugs, attach them to the floor with carpet tape.  Make sure that you have a light switch at the top of the stairs and the bottom of the stairs. If you do not have them, ask someone to add them for you. What else can I do to help prevent falls?  Wear shoes that:  Do not have high heels.  Have rubber bottoms.  Are comfortable and fit you well.  Are closed at the toe. Do not wear sandals.  If you use a stepladder:  Make sure that it is fully opened. Do not climb a closed stepladder.  Make sure that both sides of the stepladder are locked into place.  Ask someone to hold it for you, if possible.  Clearly mark and make sure that you can see:  Any grab bars or handrails.  First and last steps.  Where the edge of each step is.  Use tools that help you move around (mobility aids) if they are needed. These include:  Canes.  Walkers.  Scooters.  Crutches.  Turn on the lights when you go into a dark area. Replace any light bulbs as soon as they burn out.  Set up your furniture so you have a clear path. Avoid moving your furniture around.  If any of your floors are uneven, fix them.  If there are any pets around you, be aware of where they are.  Review your medicines with your doctor. Some medicines can make you feel dizzy. This can increase your chance of falling. Ask your doctor what other things that you can do to help prevent falls. This information is not intended to replace advice given to you by your health care provider. Make sure you discuss any questions you have with your health care provider. Document Released: 07/26/2009 Document Revised: 03/06/2016 Document Reviewed: 11/03/2014 Elsevier Interactive Patient Education  2017 [employer].

## 2021-05-24 DIAGNOSIS — M542 Cervicalgia: Secondary | ICD-10-CM | POA: Diagnosis not present

## 2021-05-24 DIAGNOSIS — M25512 Pain in left shoulder: Secondary | ICD-10-CM | POA: Diagnosis not present

## 2021-06-24 ENCOUNTER — Telehealth: Payer: Self-pay | Admitting: Family

## 2021-06-24 NOTE — Telephone Encounter (Signed)
close

## 2021-07-01 ENCOUNTER — Telehealth: Payer: Self-pay | Admitting: Family

## 2021-07-01 DIAGNOSIS — K921 Melena: Secondary | ICD-10-CM

## 2021-07-01 NOTE — Telephone Encounter (Signed)
Patient called in stating that she had a stool sample sent in by the home health company and she received a letter today that there is blood in her stool and she is unsure what to do next.Please advise.

## 2021-07-01 NOTE — Telephone Encounter (Signed)
Wanda Clayton  Call patient I placed urgent referral to colonoscopy, GI.  Please ask her if she has hemorrhoids. Rasheeda, How soon can patient have colonoscopy scheduled?

## 2021-07-01 NOTE — Telephone Encounter (Signed)
Pt is scheduled 11/28. Did you want to place referral to GI?

## 2021-07-01 NOTE — Telephone Encounter (Signed)
Pt stated that she does have hemorrhoids & colitis. She stated that she was not worried, but she would be waiting for GI's call to get her scheduled.

## 2021-07-02 NOTE — Telephone Encounter (Signed)
Awesome thank you so much. Pt just called [place] yesterday to let us know this & she has had a lot going on with her family as well.

## 2021-07-02 NOTE — Telephone Encounter (Signed)
noted 

## 2021-07-09 ENCOUNTER — Other Ambulatory Visit: Payer: Self-pay | Admitting: Family

## 2021-07-09 DIAGNOSIS — Z1231 Encounter for screening mammogram for malignant neoplasm of breast: Secondary | ICD-10-CM

## 2021-07-09 NOTE — Telephone Encounter (Signed)
Patient is calling to see if her referral can be sent to [facility] instead of [facility].She would prefer to stay in [facility] for GI appointments.Please advise.

## 2021-07-09 NOTE — Telephone Encounter (Signed)
I called patient & she sated that she received a call today from [place]. I told her it did appear that was sent to [facility]. I told her she could call [facility] to see if they could go ahead & get her scheduled, but I would go with who could see her first. I provided number to both offices.

## 2021-07-09 NOTE — Telephone Encounter (Signed)
Can referral be placed to [facility]?

## 2021-07-10 ENCOUNTER — Telehealth: Payer: Self-pay | Admitting: Family

## 2021-07-10 NOTE — Telephone Encounter (Signed)
Appt dr Marius Ditch 08/27/21  Sarah, can we move appt up for blood in stool?  Can you call dr Verlin Grills office?

## 2021-07-10 NOTE — Telephone Encounter (Signed)
I called [facility] & they were able to see patient 10/19 at 1:15p. I called & patient aware that appointment was moved up.

## 2021-07-10 NOTE — Telephone Encounter (Signed)
Rejection Reason - Other - Per referral, patient is being referred to [facility]. First available appt at [facility] is December, as referral was reviewed to be not urgent." Agustina Caroli said on Jul 09, 2021 2:36 PM  Msg from [facility] gastro

## 2021-07-31 ENCOUNTER — Other Ambulatory Visit: Payer: Self-pay

## 2021-07-31 ENCOUNTER — Encounter: Payer: Self-pay | Admitting: Gastroenterology

## 2021-07-31 ENCOUNTER — Ambulatory Visit: Payer: PPO | Admitting: Gastroenterology

## 2021-07-31 VITALS — BP 128/77 | HR 112 | Temp 98.8°F | Ht 65.0 in | Wt 144.0 lb

## 2021-07-31 DIAGNOSIS — R195 Other fecal abnormalities: Secondary | ICD-10-CM

## 2021-07-31 DIAGNOSIS — K921 Melena: Secondary | ICD-10-CM

## 2021-07-31 MED ORDER — CLENPIQ 10-3.5-12 MG-GM -GM/160ML PO SOLN: 1.0000 | ORAL | 0 refills | Status: DC

## 2021-07-31 NOTE — Addendum Note (Signed)
Addended by: Cephas Darby on: 07/31/2021 06:53 PM   Modules accepted: Level of Service

## 2021-07-31 NOTE — Progress Notes (Signed)
Wanda Repress, MD [address]  [address]  [place], [place] [address]  Main: [phone]  Fax: [phone]    Gastroenterology Consultation  Referring Provider:     Allegra Grana, FNP Primary Care Physician:  Allegra Grana, FNP Primary Gastroenterologist:  Dr. Arlyss Clayton Reason for Consultation:    Stool occult positive        HPI:   Wanda Clayton is a 70 y.o. female referred by Dr. Allegra Grana, FNP  for consultation & management of stool occult positive.  Had a stool sample sent in by the home health company and she received elected from them stating that there was blood in her stool.  She contacted her PCP and is therefore referred to GI to discuss about colonoscopy.  Patient has history of collagenous colitis, not on any treatment.  She was offered to be treated in the past, she preferred not to take any medication.  She reports that her bowel movements are 2-3 times daily and she is not bothered.  She is trying to lose weight.  She denies any GI symptoms today.  Her labs are unremarkable.  NSAIDs: None  Antiplts/Anticoagulants/Anti thrombotics: None  GI Procedures:  Colonoscopy 07/23/2011 Diagnosis:  Part A: ASCENDING COLON POLYP HOT SNARE:  - HYPERPLASTIC POLYP, MULTIPLE FRAGMENTS.  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  Marland Kitchen  NOTE: There is a previous pathology report describing similar  histology, [phone] (03/07/10), and clinical correlation is  advised.  .  Part C: TRANSVERSE COLON POLYP HOT SNARE:  - HYPERPLASTIC POLYP.  .  Part D: TRANSVERSE COLON POLYP HOT SNARE:  - HYPERPLASTIC POLYP, 3 pieces.   Past Medical History:  Diagnosis Date   Acute pain of left foot 02/08/2020   Anxiety    Depression     Past Surgical History:  Procedure Laterality Date   carpal tunnel repair     DENTAL SURGERY     TUBAL LIGATION     VAGINAL DELIVERY     x1     Current Outpatient Medications:    amLODipine (NORVASC) 5 MG tablet, TAKE 1/2 TABLET BY MOUTH DAILY, Disp: 45 tablet, Rfl: 7   aspirin 81 MG tablet, Take 81 mg by mouth daily., Disp: , Rfl:    atorvastatin (LIPITOR) 40 MG tablet, TAKE 1 TABLET (40 MG TOTAL) BY MOUTH DAILY AT 6 PM., Disp: 90 tablet, Rfl: 1   ezetimibe (ZETIA) 10 MG tablet, TAKE 1 TABLET BY MOUTH DAILY. PLEASE SCHEDULE OFFICE VISIT FOR FURTHER REFILLS. THANK YOU!, Disp: 90 tablet, Rfl: 2   FLUoxetine (PROZAC) 20 MG capsule, Take 1 capsule (20 mg total) by mouth daily., Disp: 90 capsule, Rfl: 3   FLUoxetine (PROZAC) 40 MG capsule, Take 1 capsule (40 mg total) by mouth daily., Disp: 90 capsule, Rfl: 3   Multiple Vitamins-Minerals (EQ MULTIVITAMINS ADULT GUMMY PO), Take by mouth daily., Disp: , Rfl:    propranolol (INDERAL) 10 MG tablet, propranolol 10 mg tablet  TAKE 1 TABLET (10 MG TOTAL) BY MOUTH 2 (TWO) TIMES DAILY AS NEEDED (ANXIETY)., Disp: , Rfl:    Sod Picosulfate-Mag Ox-Cit Acd (CLENPIQ) 10-3.5-12 MG-GM -GM/160ML SOLN, Take 1 kit by mouth as directed. At 5 PM evening before procedure, drink 1 bottle of Clenpiq, hydrate, drink (5) 8 oz of water. Then do the same thing 5 hours prior to your procedure., Disp: 320 mL, Rfl: 0  Family  History  Problem Relation Age of Onset   Hypertension Mother    Colon cancer Mother 45   Atrial fibrillation Mother    Heart disease Father    Diabetes Father    Colon cancer Father 21   Congestive Heart Failure Father    COPD Sister    Crohn's disease Sister    Colon cancer Sister 84   Heart disease Brother    Heart attack Sister    Breast cancer Neg Hx      Social History   Tobacco Use   Smoking status: Every Day    Packs/day: 1.00    Years: 36.00    Pack years: 36.00    Types: Cigarettes   Smokeless tobacco: Never   Tobacco comments:    she plans to quit without medication  Vaping Use   Vaping Use: Never used  Substance Use Topics   Alcohol use: Yes    Comment: Daily  glass wine x2   Drug use: No    Allergies as of 07/31/2021 - Review Complete 07/31/2021  Allergen Reaction Noted   Pollen extract Itching 03/13/2014    Review of Systems:    All systems reviewed and negative except where noted in HPI.   Physical Exam:  BP 128/77   Pulse (!) 112   Temp 98.8 F (37.1 C) (Oral)   Ht $R'5\' 5"'Hu$  (1.651 m)   Wt 144 lb (65.3 kg)   BMI 23.96 kg/m  No LMP recorded. Patient is postmenopausal.  General:   Alert,  Well-developed, well-nourished, pleasant and cooperative in NAD Head:  Normocephalic and atraumatic. Eyes:  Sclera clear, no icterus.   Conjunctiva pink. Ears:  Normal auditory acuity. Nose:  No deformity, discharge, or lesions. Mouth:  No deformity or lesions,oropharynx pink & moist. Neck:  Supple; no masses or thyromegaly. Lungs:  Respirations even and unlabored.  Clear throughout to auscultation.   No wheezes, crackles, or rhonchi. No acute distress. Heart:  Regular rate and rhythm; no murmurs, clicks, rubs, or gallops. Abdomen:  Normal bowel sounds. Soft, non-tender and non-distended without masses, hepatosplenomegaly or hernias noted.  No guarding or rebound tenderness.   Rectal: Not performed Msk:  Symmetrical without gross deformities. Good, equal movement & strength bilaterally. Pulses:  Normal pulses noted. Extremities:  No clubbing or edema.  No cyanosis. Neurologic:  Alert and oriented x3;  grossly normal neurologically. Skin:  Intact without significant lesions or rashes. No jaundice. Psych:  Alert and cooperative. Normal mood and affect.  Imaging Studies: No abdominal imaging  Assessment and Plan:   Wanda Clayton is a 70 y.o. pleasant Caucasian female with history of hypertension, chronic tobacco use is seen in consultation for fecal occult positive test.  Patient has history of collagenous colitis, not on any maintenance therapy  I have discussed about colonoscopy and patient is agreeable Recommend random colon biopsies to be  performed given her history of collagenous colitis  I have discussed alternative options, risks & benefits,  which include, but are not limited to, bleeding, infection, perforation,respiratory complication & drug reaction.  The patient agrees with this plan & written consent will be obtained.     Follow up as needed   Cephas Darby, MD

## 2021-08-05 ENCOUNTER — Other Ambulatory Visit: Payer: Self-pay | Admitting: Family

## 2021-08-05 DIAGNOSIS — E785 Hyperlipidemia, unspecified: Secondary | ICD-10-CM

## 2021-08-15 ENCOUNTER — Ambulatory Visit
Admission: RE | Admit: 2021-08-15 | Discharge: 2021-08-15 | Disposition: A | Discharge status: Home / Self Care | Payer: PPO | Source: Ambulatory Visit | Attending: Family | Admitting: Family

## 2021-08-15 ENCOUNTER — Other Ambulatory Visit: Payer: Self-pay

## 2021-08-15 DIAGNOSIS — Z1231 Encounter for screening mammogram for malignant neoplasm of breast: Secondary | ICD-10-CM | POA: Diagnosis not present

## 2021-08-16 ENCOUNTER — Encounter: Payer: Self-pay | Admitting: Gastroenterology

## 2021-08-18 ENCOUNTER — Other Ambulatory Visit: Payer: Self-pay | Admitting: Cardiovascular Disease

## 2021-08-19 ENCOUNTER — Ambulatory Visit: Payer: PPO | Admitting: Anesthesiology

## 2021-08-19 ENCOUNTER — Encounter
Admission: RE | Disposition: A | Discharge status: Home / Self Care | Payer: Self-pay | Source: Ambulatory Visit | Attending: Gastroenterology

## 2021-08-19 ENCOUNTER — Encounter: Payer: Self-pay | Admitting: Gastroenterology

## 2021-08-19 ENCOUNTER — Other Ambulatory Visit: Payer: Self-pay

## 2021-08-19 ENCOUNTER — Ambulatory Visit
Admission: RE | Admit: 2021-08-19 | Discharge: 2021-08-19 | Disposition: A | Discharge status: Home / Self Care | Payer: PPO | Source: Ambulatory Visit | Attending: Gastroenterology | Admitting: Gastroenterology

## 2021-08-19 DIAGNOSIS — K5730 Diverticulosis of large intestine without perforation or abscess without bleeding: Secondary | ICD-10-CM | POA: Insufficient documentation

## 2021-08-19 DIAGNOSIS — K635 Polyp of colon: Secondary | ICD-10-CM | POA: Diagnosis not present

## 2021-08-19 DIAGNOSIS — D122 Benign neoplasm of ascending colon: Secondary | ICD-10-CM | POA: Insufficient documentation

## 2021-08-19 DIAGNOSIS — F17210 Nicotine dependence, cigarettes, uncomplicated: Secondary | ICD-10-CM | POA: Insufficient documentation

## 2021-08-19 DIAGNOSIS — J449 Chronic obstructive pulmonary disease, unspecified: Secondary | ICD-10-CM | POA: Insufficient documentation

## 2021-08-19 DIAGNOSIS — E785 Hyperlipidemia, unspecified: Secondary | ICD-10-CM | POA: Diagnosis not present

## 2021-08-19 DIAGNOSIS — K921 Melena: Secondary | ICD-10-CM | POA: Diagnosis not present

## 2021-08-19 DIAGNOSIS — Z800 Family history of malignant neoplasm of digestive organs: Secondary | ICD-10-CM | POA: Diagnosis not present

## 2021-08-19 DIAGNOSIS — Z1211 Encounter for screening for malignant neoplasm of colon: Secondary | ICD-10-CM | POA: Diagnosis not present

## 2021-08-19 DIAGNOSIS — I10 Essential (primary) hypertension: Secondary | ICD-10-CM | POA: Diagnosis not present

## 2021-08-19 HISTORY — PX: COLONOSCOPY WITH PROPOFOL: SHX5780

## 2021-08-19 HISTORY — DX: Essential (primary) hypertension: I10

## 2021-08-19 SURGERY — COLONOSCOPY WITH PROPOFOL
Anesthesia: General

## 2021-08-19 MED ORDER — SODIUM CHLORIDE 0.9 % IV SOLN: INTRAVENOUS | Status: DC

## 2021-08-19 MED ORDER — LIDOCAINE HCL (CARDIAC) PF 100 MG/5ML IV SOSY
PREFILLED_SYRINGE | INTRAVENOUS | Status: DC | PRN
  Administered 2021-08-19: 50 mg via INTRAVENOUS

## 2021-08-19 MED ORDER — PROPOFOL 10 MG/ML IV BOLUS
INTRAVENOUS | Status: DC | PRN
  Administered 2021-08-19: 70 mg via INTRAVENOUS

## 2021-08-19 MED ORDER — PROPOFOL 500 MG/50ML IV EMUL
INTRAVENOUS | Status: AC
  Filled 2021-08-19: qty 50

## 2021-08-19 MED ORDER — PROPOFOL 500 MG/50ML IV EMUL
INTRAVENOUS | Status: DC | PRN
  Administered 2021-08-19: 175 ug/kg/min via INTRAVENOUS

## 2021-08-19 NOTE — Anesthesia Procedure Notes (Signed)
Date/Time: 08/19/2021 10:36 AM Performed by: Johnna Acosta, CRNA Pre-anesthesia Checklist: Patient identified, Emergency Drugs available, Suction available, Patient being monitored and Timeout performed Patient Re-evaluated:Patient Re-evaluated prior to induction Oxygen Delivery Method: Nasal cannula Preoxygenation: Pre-oxygenation with 100% oxygen Induction Type: IV induction

## 2021-08-19 NOTE — Anesthesia Preprocedure Evaluation (Signed)
Anesthesia Evaluation  Patient identified by MRN, date of birth, ID band Patient awake    Reviewed: Allergy & Precautions, NPO status , Patient's Chart, lab work & pertinent test results  Airway Mallampati: II  TM Distance: >3 FB Neck ROM: full    Dental  (+) Teeth Intact   Pulmonary neg pulmonary ROS, COPD, Current Smoker and Patient abstained from smoking.,    Pulmonary exam normal  + decreased breath sounds      Cardiovascular Exercise Tolerance: Good hypertension, Pt. on medications + CAD  negative cardio ROS Normal cardiovascular exam Rhythm:Regular Rate:Normal     Neuro/Psych Anxiety Depression negative neurological ROS  negative psych ROS   GI/Hepatic negative GI ROS, Neg liver ROS,   Endo/Other  negative endocrine ROS  Renal/GU negative Renal ROS  negative genitourinary   Musculoskeletal   Abdominal Normal abdominal exam  (+)   Peds negative pediatric ROS (+)  Hematology negative hematology ROS (+)   Anesthesia Other Findings Past Medical History: 02/08/2020: Acute pain of left foot No date: Anxiety No date: Depression 11/28/2020: Elevated liver enzymes No date: Hypertension  Past Surgical History: No date: carpal tunnel repair No date: DENTAL SURGERY No date: TUBAL LIGATION No date: VAGINAL DELIVERY     Comment:  x1  BMI    Body Mass Index: 22.80 kg/m      Reproductive/Obstetrics negative OB ROS                             Anesthesia Physical Anesthesia Plan  ASA: 3  Anesthesia Plan: General   Post-op Pain Management:    Induction: Intravenous  PONV Risk Score and Plan: Propofol infusion and TIVA  Airway Management Planned: Nasal Cannula  Additional Equipment:   Intra-op Plan:   Post-operative Plan:   Informed Consent: I have reviewed the patients History and Physical, chart, labs and discussed the procedure including the risks, benefits and  alternatives for the proposed anesthesia with the patient or authorized representative who has indicated his/her understanding and acceptance.     Dental Advisory Given  Plan Discussed with: CRNA and Surgeon  Anesthesia Plan Comments:         Anesthesia Quick Evaluation

## 2021-08-19 NOTE — Transfer of Care (Signed)
Immediate Anesthesia Transfer of Care Note  Patient: Wanda Clayton  Procedure(s) Performed: COLONOSCOPY WITH PROPOFOL  Patient Location: PACU  Anesthesia Type:General  Level of Consciousness: awake and drowsy  Airway & Oxygen Therapy: Patient Spontanous Breathing  Post-op Assessment: Report given to RN and Post -op Vital signs reviewed and stable  Post vital signs: Reviewed and stable  Last Vitals:  Vitals Value Taken Time  BP 127/68 08/19/21 1107  Temp 35.9 C 08/19/21 1104  Pulse 77 08/19/21 1107  Resp 17 08/19/21 1107  SpO2 100 % 08/19/21 1107  Vitals shown include unvalidated device data.  Last Pain:  Vitals:   08/19/21 1104  TempSrc: Temporal  PainSc: 0-No pain         Complications: No notable events documented.

## 2021-08-19 NOTE — Op Note (Signed)
[facility] Gastroenterology Patient Name: Wanda Clayton Procedure Date: 08/19/2021 10:29 AM MRN: [id number] Account #: [id number] Date of Birth: [birth year] Admit Type: Outpatient Age: 70 Room: [facility] ENDO ROOM 1 Gender: Female Note Status: Finalized Instrument Name: Colonoscope [id number] Procedure:             Colonoscopy Indications:           Last colonoscopy: July 2012, Heme positive stool Providers:             Lin Landsman MD, MD Referring MD:          Yvetta Coder. Arnett (Referring MD) Medicines:             General Anesthesia Complications:         No immediate complications. Estimated blood loss: None. Procedure:             Pre-Anesthesia Assessment:                        - Prior to the procedure, a History and Physical was                         performed, and patient medications and allergies were                         reviewed. The patient is competent. The risks and                         benefits of the procedure and the sedation options and                         risks were discussed with the patient. All questions                         were answered and informed consent was obtained.                         Patient identification and proposed procedure were                         verified by the physician, the nurse, the                         anesthesiologist, the anesthetist and the technician                         in the pre-procedure area in the procedure room in the                         endoscopy suite. Mental Status Examination: alert and                         oriented. Airway Examination: normal oropharyngeal                         airway and neck mobility. Respiratory Examination:                         clear to auscultation. CV Examination: normal.  Prophylactic Antibiotics: The patient does not require                         prophylactic antibiotics. Prior Anticoagulants: The                          patient has taken no previous anticoagulant or                         antiplatelet agents. ASA Grade Assessment: III - A                         patient with severe systemic disease. After reviewing                         the risks and benefits, the patient was deemed in                         satisfactory condition to undergo the procedure. The                         anesthesia plan was to use general anesthesia.                         Immediately prior to administration of medications,                         the patient was re-assessed for adequacy to receive                         sedatives. The heart rate, respiratory rate, oxygen                         saturations, blood pressure, adequacy of pulmonary                         ventilation, and response to care were monitored                         throughout the procedure. The physical status of the                         patient was re-assessed after the procedure.                        After obtaining informed consent, the colonoscope was                         passed under direct vision. Throughout the procedure,                         the patient's blood pressure, pulse, and oxygen                         saturations were monitored continuously. The                         Colonoscope was introduced through the anus and  advanced to the the cecum, identified by appendiceal                         orifice and ileocecal valve. 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 Successful completion of the procedure was                         aided by applying abdominal pressure. The patient                         tolerated the procedure well. The quality of the bowel                         preparation was evaluated using the BBPS [facility]  Bowel                         Preparation Scale) with scores of: Right Colon = 3,                         Transverse Colon = 3 and Left Colon = 3 (entire mucosa                         seen well with no residual staining, small fragments                         of stool or opaque liquid). The total BBPS score                         equals 9. Findings:      The perianal and digital rectal examinations were normal. Pertinent       negatives include normal sphincter tone and no palpable rectal lesions.      A 6 mm polyp was found in the ascending colon. The polyp was sessile.       The polyp was removed with a cold snare. Resection and retrieval were       complete. Estimated blood loss: none.      The retroflexed view of the distal rectum and anal verge was normal and       showed no anal or rectal abnormalities. Impression:            - One 6 mm polyp in the ascending colon, removed with                         a cold snare. Resected and retrieved.                        - The distal rectum and anal verge are normal on  retroflexion view. Recommendation:        - Discharge patient to home (with escort).                        - Resume previous diet today.                        - Continue present medications.                        - Await pathology results.                        - Repeat colonoscopy in 5 years for surveillance. Procedure Code(s):     --- Professional ---                        [id number], Colonoscopy, flexible; with removal of                         tumor(s), polyp(s), or other lesion(s) by snare                         technique Diagnosis Code(s):     --- Professional ---                        K63.5, Polyp of colon                        R19.5, Other fecal abnormalities CPT copyright 2019 American Medical Association. All rights reserved. The codes documented in this report are preliminary and upon coder review may  be revised to meet current  compliance requirements. Dr. Ulyess Mort Lin Landsman MD, MD 08/19/2021 11:02:33 AM This report has been signed electronically. Number of Addenda: 0 Note Initiated On: 08/19/2021 10:29 AM Scope Withdrawal Time: 0 hours 10 minutes 28 seconds  Total Procedure Duration: 0 hours 17 minutes 28 seconds  Estimated Blood Loss:  Estimated blood loss: none.      [facility]

## 2021-08-19 NOTE — H&P (Signed)
Wanda Darby, MD [address]  [place]  [place], [place] [address]  Main: [phone]  Fax: [phone] Pager: [phone]  Primary Care Physician:  Wanda Hawthorne, FNP Primary Gastroenterologist:  Dr. Cephas Clayton  Pre-Procedure History & Physical: HPI:  Wanda Clayton is a 70 y.o. female is here for an endoscopy.   Past Medical History:  Diagnosis Date   Acute pain of left foot 02/08/2020   Anxiety    Depression    Elevated liver enzymes 11/28/2020   Hypertension     Past Surgical History:  Procedure Laterality Date   carpal tunnel repair     DENTAL SURGERY     TUBAL LIGATION     VAGINAL DELIVERY     x1    Prior to Admission medications   Medication Sig Start Date End Date Taking? Authorizing Provider  amLODipine (NORVASC) 5 MG tablet TAKE 1/2 TABLET BY MOUTH DAILY 02/19/21  Yes Wanda Hawthorne, FNP  aspirin 81 MG tablet Take 81 mg by mouth daily.   Yes [provider]  atorvastatin (LIPITOR) 40 MG tablet TAKE 1 TABLET BY MOUTH DAILY AT 6 PM. 08/05/21  Yes Arnett, Yvetta Coder, FNP  ezetimibe (ZETIA) 10 MG tablet TAKE 1 TABLET BY MOUTH DAILY. PLEASE SCHEDULE OFFICE VISIT FOR FURTHER REFILLS. THANK YOU! 11/26/20  Yes Wanda Merritts, MD  FLUoxetine (PROZAC) 20 MG capsule Take 1 capsule (20 mg total) by mouth daily. 11/28/20  Yes Wanda Hawthorne, FNP  FLUoxetine (PROZAC) 40 MG capsule Take 1 capsule (40 mg total) by mouth daily. 11/28/20  Yes Arnett, Yvetta Coder, FNP  Multiple Vitamins-Minerals (EQ MULTIVITAMINS ADULT GUMMY PO) Take by mouth daily.    [provider]  propranolol (INDERAL) 10 MG tablet propranolol 10 mg tablet  TAKE 1 TABLET (10 MG TOTAL) BY MOUTH 2 (TWO) TIMES DAILY AS NEEDED (ANXIETY).    [provider]  Sod Picosulfate-Mag Ox-Cit Acd (CLENPIQ) 10-3.5-12 MG-GM -GM/160ML SOLN Take 1 kit by mouth as directed. At 5 PM evening before procedure, drink 1 bottle of Clenpiq, hydrate, drink (5) 8 oz of water. Then do  the same thing 5 hours prior to your procedure. 07/31/21   Wanda Landsman, MD    Allergies as of 07/31/2021 - Review Complete 07/31/2021  Allergen Reaction Noted   Pollen extract Itching 03/13/2014    Family History  Problem Relation Age of Onset   Hypertension Mother    Colon cancer Mother 59   Atrial fibrillation Mother    Heart disease Father    Diabetes Father    Colon cancer Father 43   Congestive Heart Failure Father    COPD Sister    Crohn's disease Sister    Colon cancer Sister 69   Heart disease Brother    Heart attack Sister    Breast cancer Neg Hx     Social History   Socioeconomic History   Marital status: Married    Spouse name: Not on file   Number of children: 1   Years of education: Not on file   Highest education level: Not on file  Occupational History    Employer: [employer]  Tobacco Use   Smoking status: Every Day    Packs/day: 1.00    Years: 36.00    Pack years: 36.00    Types: Cigarettes   Smokeless tobacco: Never   Tobacco comments:    she plans to quit without medication  Vaping Use   Vaping  Use: Never used  Substance and Sexual Activity   Alcohol use: Yes    Comment: Daily glass wine x2   Drug use: No   Sexual activity: Not Currently    Birth control/protection: None, Post-menopausal  Other Topics Concern   Not on file  Social History Narrative   Works for [employer], retired 06/2016   Married.   Daughter and granddaughter.          Social Determinants of Health   Financial Resource Strain: Low Risk    Difficulty of Paying Living Expenses: Not hard at all  Food Insecurity: No Food Insecurity   Worried About Charity fundraiser in the Last Year: Never true   [place] in the Last Year: Never true  Transportation Needs: No Transportation Needs   Lack of Transportation (Medical): No   Lack of Transportation (Non-Medical): No  Physical Activity: Not on file  Stress: No Stress Concern Present   Feeling of  Stress : Only a little  Social Connections: Unknown   Frequency of Communication with Friends and Family: Not on file   Frequency of Social Gatherings with Friends and Family: Not on file   Attends Religious Services: Not on Electrical engineer or Organizations: Not on file   Attends Archivist Meetings: Not on file   Marital Status: Married  Human resources officer Violence: Not At Risk   Fear of Current or Ex-Partner: No   Emotionally Abused: No   Physically Abused: No   Sexually Abused: No    Review of Systems: See HPI, otherwise negative ROS  Physical Exam: BP 125/77   Pulse 98   Temp (!) 96.7 F (35.9 C) (Temporal)   Resp 20   Ht $R'5\' 5"'Pw$  (1.651 m)   Wt 62.1 kg   SpO2 98%   BMI 22.80 kg/m  General:   Alert,  pleasant and cooperative in NAD Head:  Normocephalic and atraumatic. Neck:  Supple; no masses or thyromegaly. Lungs:  Clear throughout to auscultation.    Heart:  Regular rate and rhythm. Abdomen:  Soft, nontender and nondistended. Normal bowel sounds, without guarding, and without rebound.   Neurologic:  Alert and  oriented x4;  grossly normal neurologically.  Impression/Plan: Wanda Clayton is here for an endoscopy to be performed for blood in stool  Risks, benefits, limitations, and alternatives regarding  colonoscopy have been reviewed with the patient.  Questions have been answered.  All parties agreeable.   Wanda Sear, MD  08/19/2021, 10:00 AM

## 2021-08-20 ENCOUNTER — Encounter: Payer: Self-pay | Admitting: Gastroenterology

## 2021-08-20 LAB — SURGICAL PATHOLOGY

## 2021-08-20 NOTE — Anesthesia Postprocedure Evaluation (Signed)
Anesthesia Post Note  Patient: Wanda Clayton  Procedure(s) Performed: COLONOSCOPY WITH PROPOFOL  Patient location during evaluation: PACU Anesthesia Type: General Level of consciousness: awake and alert Pain management: pain level controlled Vital Signs Assessment: post-procedure vital signs reviewed and stable Respiratory status: spontaneous breathing, nonlabored ventilation, respiratory function stable and patient connected to nasal cannula oxygen Cardiovascular status: blood pressure returned to baseline and stable Postop Assessment: no apparent nausea or vomiting Anesthetic complications: no   No notable events documented.   Last Vitals:  Vitals:   08/19/21 1107 08/19/21 1124  BP: 127/68 (!) 142/86  Pulse: 77   Resp: (!) 23   Temp:    SpO2: 100%     Last Pain:  Vitals:   08/19/21 1124  TempSrc:   PainSc: 0-No pain                 Molli Barrows

## 2021-08-21 ENCOUNTER — Encounter: Payer: Self-pay | Admitting: Gastroenterology

## 2021-08-27 ENCOUNTER — Ambulatory Visit: Payer: PPO | Admitting: Gastroenterology

## 2021-09-09 ENCOUNTER — Encounter: Payer: Self-pay | Admitting: Family

## 2021-09-09 ENCOUNTER — Ambulatory Visit (INDEPENDENT_AMBULATORY_CARE_PROVIDER_SITE_OTHER): Payer: PPO | Admitting: Family

## 2021-09-09 ENCOUNTER — Other Ambulatory Visit: Payer: Self-pay

## 2021-09-09 VITALS — BP 160/70 | HR 105 | Temp 98.2°F | Ht 65.0 in | Wt 144.4 lb

## 2021-09-09 DIAGNOSIS — F411 Generalized anxiety disorder: Secondary | ICD-10-CM | POA: Diagnosis not present

## 2021-09-09 DIAGNOSIS — F17200 Nicotine dependence, unspecified, uncomplicated: Secondary | ICD-10-CM | POA: Diagnosis not present

## 2021-09-09 DIAGNOSIS — I700 Atherosclerosis of aorta: Secondary | ICD-10-CM | POA: Diagnosis not present

## 2021-09-09 DIAGNOSIS — I10 Essential (primary) hypertension: Secondary | ICD-10-CM | POA: Diagnosis not present

## 2021-09-09 MED ORDER — AMLODIPINE BESYLATE 5 MG PO TABS
7.5000 mg | ORAL_TABLET | Freq: Every day | ORAL | 2 refills | Status: DC
Start: 2021-09-09 — End: 2022-01-08

## 2021-09-09 NOTE — Patient Instructions (Signed)
Increase amlodipine to 7.5mg    It is imperative that you are seen AT least twice per year for labs and monitoring. Monitor blood pressure at home and me 5-6 reading on separate days. Goal is less than 120/80, based on newest guidelines, however we certainly want to be less than 130/80;  if persistently higher, please make sooner follow up appointment so we can recheck you blood pressure and manage/ adjust medications.  Referral for CT lung cancer screening Let us know if you dont hear back within a week in regards to an appointment being scheduled.

## 2021-09-10 NOTE — Assessment & Plan Note (Signed)
Chronic, stable.  Continue Prozac 40mg.  

## 2021-09-10 NOTE — Assessment & Plan Note (Signed)
Elevated today.  Advise she benefit from a 10 mg dose of amlodipine.  Patient was felt more comfortable with amlodipine 7.5 mg and will monitor blood pressure at home with the goal of blood pressure being less than 120/80.

## 2021-09-10 NOTE — Assessment & Plan Note (Signed)
Chronic, stable.  Continue Lipitor 40 mg, Zetia 10 mg

## 2021-09-10 NOTE — Progress Notes (Signed)
Subjective:    [name], female    DOB: 25-Jan-1951, 70 y.o.   MRN: [id number]  CC: Wanda Clayton is a 70 y.o. female who presents today for follow up.   HPI: Feels well today.  No new complaints  atherosclerosis-compliant with  lipitor 40mg , zetia 10mg    Hypertension-compliant with amlodipine 5 mg and took a couple of hours ago. At home, 138/78.  No cp, sob.    She smoked a cigarette prior to coming in today.  One cup of coffee each morning.      GAD-compliant with Prozac 40 mg.  She feels this dose is adequate for her.   due CT lung cancer screening HISTORY:  Past Medical History:  Diagnosis Date   Acute pain of left foot 02/08/2020   Anxiety    Depression    Elevated liver enzymes 11/28/2020   Hypertension    Past Surgical History:  Procedure Laterality Date   carpal tunnel repair     COLONOSCOPY WITH PROPOFOL N/A 08/19/2021   Procedure: COLONOSCOPY WITH PROPOFOL;  Surgeon: Lin Landsman, MD;  Location: [facility] ENDOSCOPY;  Service: Gastroenterology;  Laterality: N/A;   DENTAL SURGERY     TUBAL LIGATION     VAGINAL DELIVERY     x1   Family History  Problem Relation Age of Onset   Hypertension Mother    Colon cancer Mother 29   Atrial fibrillation Mother    Heart disease Father    Diabetes Father    Colon cancer Father 6   Congestive Heart Failure Father    COPD Sister    Crohn's disease Sister    Colon cancer Sister 70   Heart disease Brother    Heart attack Sister    Breast cancer Neg Hx     Allergies: Pollen extract Current Outpatient Medications on File Prior to Visit  Medication Sig Dispense Refill   aspirin 81 MG tablet Take 81 mg by mouth daily.     atorvastatin (LIPITOR) 40 MG tablet TAKE 1 TABLET BY MOUTH DAILY AT 6 PM. 90 tablet 1   ezetimibe (ZETIA) 10 MG tablet Take 1 tablet (10 mg total) by mouth daily. 90 tablet 0   FLUoxetine (PROZAC) 40 MG capsule Take 1 capsule (40 mg total) by mouth daily. 90 capsule 3   Multiple  Vitamins-Minerals (EQ MULTIVITAMINS ADULT GUMMY PO) Take by mouth daily.     propranolol (INDERAL) 10 MG tablet propranolol 10 mg tablet  TAKE 1 TABLET (10 MG TOTAL) BY MOUTH 2 (TWO) TIMES DAILY AS NEEDED (ANXIETY).     No current facility-administered medications on file prior to visit.    Social History   Tobacco Use   Smoking status: Every Day    Packs/day: 1.00    Years: 36.00    Pack years: 36.00    Types: Cigarettes   Smokeless tobacco: Never   Tobacco comments:    she plans to quit without medication  Vaping Use   Vaping Use: Never used  Substance Use Topics   Alcohol use: Yes    Comment: Daily glass wine x2   Drug use: No    Review of Systems  Constitutional:  Negative for chills and fever.  Respiratory:  Negative for cough.   Cardiovascular:  Negative for chest pain and palpitations.  Gastrointestinal:  Negative for nausea and vomiting.     Objective:    BP (!) 160/70 (BP Location: Left Arm, Patient Position: Sitting, Cuff Size: Large)  Pulse (!) 105   Temp 98.2 F (36.8 C) (Oral)   Ht 5\' 5"  (1.651 m)   Wt 144 lb 6.4 oz (65.5 kg)   SpO2 96%   BMI 24.03 kg/m  BP Readings from Last 3 Encounters:  09/09/21 (!) 160/70  08/19/21 (!) 142/86  07/31/21 128/77   Wt Readings from Last 3 Encounters:  09/09/21 144 lb 6.4 oz (65.5 kg)  08/19/21 137 lb (62.1 kg)  07/31/21 144 lb (65.3 kg)    Physical Exam Vitals reviewed.  Constitutional:      Appearance: She is well-developed.  Eyes:     Conjunctiva/sclera: Conjunctivae normal.  Cardiovascular:     Rate and Rhythm: Normal rate and regular rhythm.     Pulses: Normal pulses.     Heart sounds: Normal heart sounds.  Pulmonary:     Effort: Pulmonary effort is normal.     Breath sounds: Normal breath sounds. No wheezing, rhonchi or rales.  Skin:    General: Skin is warm and dry.  Neurological:     Mental Status: She is alert.  Psychiatric:        Speech: Speech normal.        Behavior: Behavior normal.         Thought Content: Thought content normal.       Assessment & Plan:   Problem List Items Addressed This Visit       Cardiovascular and Mediastinum   Atherosclerosis of aorta (HCC) - Primary    Chronic, stable.  Continue Lipitor 40 mg, Zetia 10 mg       Relevant Medications   amLODipine (NORVASC) 5 MG tablet   Other Relevant Orders   Ambulatory Referral for Lung Cancer Scre   HTN (hypertension)    Elevated today.  Advise she benefit from a 10 mg dose of amlodipine.  Patient was felt more comfortable with amlodipine 7.5 mg and will monitor blood pressure at home with the goal of blood pressure being less than 120/80.       Relevant Medications   amLODipine (NORVASC) 5 MG tablet     Other   GAD (generalized anxiety disorder)     Chronic, stable.  Continue Prozac 40mg       Smoker   Relevant Orders   Ambulatory Referral for Lung Cancer Scre   Other Visit Diagnoses     Essential hypertension       Relevant Medications   amLODipine (NORVASC) 5 MG tablet        I have changed Wyatt W. Cervantes's amLODipine. I am also having her maintain her aspirin, Multiple Vitamins-Minerals (EQ MULTIVITAMINS ADULT GUMMY PO), FLUoxetine, propranolol, atorvastatin, and ezetimibe.   Meds ordered this encounter  Medications   amLODipine (NORVASC) 5 MG tablet    Sig: Take 1.5 tablets (7.5 mg total) by mouth daily.    Dispense:  90 tablet    Refill:  2    Order Specific Question:   Supervising Provider    Answer:   Crecencio Mc [[id number]]    Return precautions given.   Risks, benefits, and alternatives of the medications and treatment plan prescribed today were discussed, and patient expressed understanding.   Education regarding symptom management and diagnosis given to patient on AVS.  Continue to follow with Burnard Hawthorne, FNP for routine health maintenance.   Wanda Clayton and I agreed with plan.   [name], FNP

## 2021-09-11 ENCOUNTER — Ambulatory Visit: Payer: PPO | Admitting: Cardiovascular Disease

## 2021-09-11 ENCOUNTER — Other Ambulatory Visit: Payer: Self-pay

## 2021-09-11 ENCOUNTER — Encounter: Payer: Self-pay | Admitting: Cardiovascular Disease

## 2021-09-11 VITALS — BP 162/78 | HR 90 | Ht 65.0 in | Wt 145.0 lb

## 2021-09-11 DIAGNOSIS — I2510 Atherosclerotic heart disease of native coronary artery without angina pectoris: Secondary | ICD-10-CM | POA: Diagnosis not present

## 2021-09-11 DIAGNOSIS — I6523 Occlusion and stenosis of bilateral carotid arteries: Secondary | ICD-10-CM | POA: Diagnosis not present

## 2021-09-11 DIAGNOSIS — F17200 Nicotine dependence, unspecified, uncomplicated: Secondary | ICD-10-CM | POA: Diagnosis not present

## 2021-09-11 DIAGNOSIS — I739 Peripheral vascular disease, unspecified: Secondary | ICD-10-CM

## 2021-09-11 DIAGNOSIS — I700 Atherosclerosis of aorta: Secondary | ICD-10-CM | POA: Diagnosis not present

## 2021-09-11 DIAGNOSIS — E782 Mixed hyperlipidemia: Secondary | ICD-10-CM | POA: Diagnosis not present

## 2021-09-11 DIAGNOSIS — I10 Essential (primary) hypertension: Secondary | ICD-10-CM | POA: Diagnosis not present

## 2021-09-11 MED ORDER — EZETIMIBE 10 MG PO TABS: 10.0000 mg | ORAL_TABLET | Freq: Every day | ORAL | 3 refills | Status: DC

## 2021-09-11 NOTE — Progress Notes (Signed)
Cardiology Office Note  Date:  09/11/2021   ID:  Rachyl, Wuebker 1950-11-10, MRN [id number]  PCP:  Burnard Hawthorne, FNP   Chief Complaint  Patient presents with   12 month follow up     "Doing well." Medications reviewed by the patient verbally.     HPI:  [name] is a 70 yo woman with past medical history of Smoker, avg 1 ppd atherosclerosis seen on CT chest  Hyperlipidemia Hypertension on Depression/anxiety Presents for follow-up of her aortic atherosclerosis and hypertension  In follow-up today reports that she is doing well Denies any chest pain concerning for angina Smoking less, <1 ppd Husband quit smoking after his heart attack  Other stressors Mother at [employer], 84 yo Weights 84 pounds, on oxygen Lost step father  No recent falls Tolerating Lipitor, Zetia  Blood pressure controlled at home, elevated on today's visit, was rushing  Prior CT scan reviewed: moderate coronary ca and mild in aorta  EKG personally reviewed by myself on todays visit Shows normal sinus rhythm with rate 90 bpm no significant ST or T-wave changes  Other past medical history reviewed   episode in December 2018room was spinning.  Occurred when laying on back and rolled over to husband.   Chronic tinnitus, hearing loss ( wears hearing aids).   CT scan 2019 Mild to moderate aortic atherosclerosis in the arch, carotids Also noted in the coronary arteries, proximal and mid LAD, And proximal RCA   PMH:   has a past medical history of Acute pain of left foot (02/08/2020), Anxiety, Depression, Elevated liver enzymes (11/28/2020), and Hypertension.  PSH:    Past Surgical History:  Procedure Laterality Date   carpal tunnel repair     COLONOSCOPY WITH PROPOFOL N/A 08/19/2021   Procedure: COLONOSCOPY WITH PROPOFOL;  Surgeon: Lin Landsman, MD;  Location: [facility] ENDOSCOPY;  Service: Gastroenterology;  Laterality: N/A;   DENTAL SURGERY     TUBAL LIGATION      VAGINAL DELIVERY     x1    Current Outpatient Medications  Medication Sig Dispense Refill   amLODipine (NORVASC) 5 MG tablet Take 1.5 tablets (7.5 mg total) by mouth daily. 90 tablet 2   aspirin 81 MG tablet Take 81 mg by mouth daily.     atorvastatin (LIPITOR) 40 MG tablet TAKE 1 TABLET BY MOUTH DAILY AT 6 PM. 90 tablet 1   ezetimibe (ZETIA) 10 MG tablet Take 1 tablet (10 mg total) by mouth daily. 90 tablet 0   FLUoxetine (PROZAC) 40 MG capsule Take 1 capsule (40 mg total) by mouth daily. 90 capsule 3   Multiple Vitamins-Minerals (EQ MULTIVITAMINS ADULT GUMMY PO) Take by mouth daily.     propranolol (INDERAL) 10 MG tablet propranolol 10 mg tablet  TAKE 1 TABLET (10 MG TOTAL) BY MOUTH 2 (TWO) TIMES DAILY AS NEEDED (ANXIETY).     No current facility-administered medications for this visit.     Allergies:   Pollen extract   Social History:  The patient  reports that she has been smoking cigarettes. She has a 36.00 pack-year smoking history. She has never used smokeless tobacco. She reports current alcohol use. She reports that she does not use drugs.   Family History:   family history includes Atrial fibrillation in her mother; COPD in her sister; Colon cancer (age of onset: 73) in her sister; Colon cancer (age of onset: 8) in her father; Colon cancer (age of onset: 19) in her mother; Congestive Heart  Failure in her father; Crohn's disease in her sister; Diabetes in her father; Heart attack in her sister; Heart disease in her brother and father; Hypertension in her mother.    Review of Systems: Review of Systems  Constitutional: Negative.   HENT: Negative.    Respiratory: Negative.    Cardiovascular: Negative.   Gastrointestinal: Negative.   Musculoskeletal: Negative.   Neurological: Negative.   Psychiatric/Behavioral: Negative.    All other systems reviewed and are negative.   PHYSICAL EXAM: VS:  BP (!) 162/78 (BP Location: Right Arm, Patient Position: Sitting, Cuff Size:  Normal)   Pulse 90   Ht 5\' 5"  (1.651 m)   Wt 145 lb (65.8 kg)   SpO2 99%   BMI 24.13 kg/m  , BMI Body mass index is 24.13 kg/m. Constitutional:  oriented to person, place, and time. No distress.  HENT:  Head: Grossly normal Eyes:  no discharge. No scleral icterus.  Neck: No JVD, no carotid bruits  Cardiovascular: Regular rate and rhythm, no murmurs appreciated Pulmonary/Chest: Clear to auscultation bilaterally, no wheezes or rails Abdominal: Soft.  no distension.  no tenderness.  Musculoskeletal: Normal range of motion Neurological:  normal muscle tone. Coordination normal. No atrophy Skin: Skin warm and dry Psychiatric: normal affect, pleasant  Recent Labs: 10/15/2020: Hemoglobin 14.5; Platelets 311.0; TSH 2.43 12/10/2020: ALT 29; BUN 10; Creatinine, Ser 0.70; Potassium 4.7; Sodium 138    Lipid Panel Lab Results  Component Value Date   CHOL 193 10/15/2020   HDL 86.00 10/15/2020   LDLCALC 55 11/30/2019   TRIG 270.0 (H) 10/15/2020      Wt Readings from Last 3 Encounters:  09/11/21 145 lb (65.8 kg)  09/09/21 144 lb 6.4 oz (65.5 kg)  08/19/21 137 lb (62.1 kg)     ASSESSMENT AND PLAN:  Atherosclerosis of aorta (HCC) - Plan: EKG 12-Lead Mild to moderate diffuse disease in her aorta Smoking cessation recommended  on her statin with Zetia  Essential hypertension - Blood pressure is high No changes made to the medications. Will check at home and  all with numbers  Mixed hyperlipidemia Cholesterol is at goal on the current lipid regimen. No changes to the medications were made.  Carotid stenosis Mild disease bilaterally on ultrasound March 2021  Smoker Does not want Chantix We have encouraged her to continue to work on weaning her cigarettes and smoking cessation. She will continue to work on this and does not want any assistance with chantix.    Coronary artery calcification seen on CT scan On cholesterol medication, smoking cessation recommended    Total  encounter time more than 25 minutes  Greater than 50% was spent in counseling and coordination of care with the patient   No orders of the defined types were placed in this encounter.    Signed, Esmond Plants, M.D., Ph.D. 09/11/2021  [place], [place]

## 2021-09-11 NOTE — Patient Instructions (Addendum)
Medication Instructions:  No changes  Call if you would like try amlodipine 10 mg  If you need a refill on your cardiac medications before your next appointment, please call your pharmacy.   Lab work: No new labs needed  Testing/Procedures: No new testing needed  Follow-Up: At [facility], you and your health needs are our priority.  As part of our continuing mission to provide you with exceptional heart care, we have created designated Provider Care Teams.  These Care Teams include your primary Cardiologist (physician) and Advanced Practice Providers (APPs -  Physician Assistants and Nurse Practitioners) who all work together to provide you with the care you need, when you need it.  You will need a follow up appointment in 12 months, APP ok  Providers on your designated Care Team:   Murray Hodgkins, NP Christell Faith, PA-C Cadence Kathlen Mody, [place]  COVID-19 Vaccine Information can be found at: [email or web address].uk For questions related to vaccine distribution or appointments, please email [email or web address] or call [phone])[phone].   Please monitor blood pressures and keep a log of your readings.  Call the clinic in 2 weeks with BP readings or upload to your MyChart  How to use a home blood pressure monitor. Be still. Measure at the same time every day. It's important to take the readings at the same time each day, such as morning and evening. Take reading approximately 1 1/2 to 2 hours after BP medications.   AVOID these things for 30 minutes before checking your blood pressure: Drinking caffeine. Drinking alcohol. Eating. Smoking. Exercising.

## 2021-09-23 DIAGNOSIS — H25813 Combined forms of age-related cataract, bilateral: Secondary | ICD-10-CM | POA: Diagnosis not present

## 2021-09-25 ENCOUNTER — Other Ambulatory Visit: Payer: Self-pay | Admitting: *Deleted

## 2021-09-25 DIAGNOSIS — Z87891 Personal history of nicotine dependence: Secondary | ICD-10-CM

## 2021-09-25 DIAGNOSIS — F17210 Nicotine dependence, cigarettes, uncomplicated: Secondary | ICD-10-CM

## 2021-10-17 ENCOUNTER — Ambulatory Visit: Payer: PPO

## 2021-11-08 ENCOUNTER — Ambulatory Visit
Admission: RE | Admit: 2021-11-08 | Discharge: 2021-11-08 | Disposition: A | Discharge status: Home / Self Care | Payer: No Typology Code available for payment source | Source: Ambulatory Visit | Attending: Acute Care | Admitting: Acute Care

## 2021-11-08 ENCOUNTER — Other Ambulatory Visit: Payer: Self-pay

## 2021-11-08 DIAGNOSIS — F17210 Nicotine dependence, cigarettes, uncomplicated: Secondary | ICD-10-CM | POA: Diagnosis not present

## 2021-11-08 DIAGNOSIS — Z87891 Personal history of nicotine dependence: Secondary | ICD-10-CM | POA: Insufficient documentation

## 2021-11-10 ENCOUNTER — Other Ambulatory Visit: Payer: Self-pay | Admitting: Family

## 2021-11-10 DIAGNOSIS — F411 Generalized anxiety disorder: Secondary | ICD-10-CM

## 2021-11-11 ENCOUNTER — Other Ambulatory Visit: Payer: Self-pay | Admitting: Acute Care

## 2021-11-11 DIAGNOSIS — Z87891 Personal history of nicotine dependence: Secondary | ICD-10-CM

## 2021-11-11 DIAGNOSIS — F17210 Nicotine dependence, cigarettes, uncomplicated: Secondary | ICD-10-CM

## 2022-01-08 ENCOUNTER — Other Ambulatory Visit: Payer: Self-pay

## 2022-01-08 ENCOUNTER — Ambulatory Visit (INDEPENDENT_AMBULATORY_CARE_PROVIDER_SITE_OTHER): Payer: No Typology Code available for payment source | Admitting: Family

## 2022-01-08 ENCOUNTER — Encounter: Payer: Self-pay | Admitting: Family

## 2022-01-08 VITALS — BP 130/82 | HR 102 | Temp 97.7°F | Ht 65.0 in | Wt 137.4 lb

## 2022-01-08 DIAGNOSIS — F411 Generalized anxiety disorder: Secondary | ICD-10-CM | POA: Diagnosis not present

## 2022-01-08 DIAGNOSIS — Z0000 Encounter for general adult medical examination without abnormal findings: Secondary | ICD-10-CM | POA: Diagnosis not present

## 2022-01-08 DIAGNOSIS — I10 Essential (primary) hypertension: Secondary | ICD-10-CM | POA: Diagnosis not present

## 2022-01-08 DIAGNOSIS — E782 Mixed hyperlipidemia: Secondary | ICD-10-CM

## 2022-01-08 LAB — CBC WITH DIFFERENTIAL/PLATELET
Basophils Absolute: 0.1 10*3/uL (ref 0.0–0.1)
Basophils Relative: 0.8 % (ref 0.0–3.0)
Eosinophils Absolute: 0.1 10*3/uL (ref 0.0–0.7)
Eosinophils Relative: 0.5 % (ref 0.0–5.0)
HCT: 44.8 % (ref 36.0–46.0)
Hemoglobin: 15.1 g/dL — ABNORMAL HIGH (ref 12.0–15.0)
Lymphocytes Relative: 11.5 % — ABNORMAL LOW (ref 12.0–46.0)
Lymphs Abs: 1.1 10*3/uL (ref 0.7–4.0)
MCHC: 33.8 g/dL (ref 30.0–36.0)
MCV: 100.9 fl — ABNORMAL HIGH (ref 78.0–100.0)
Monocytes Absolute: 0.8 10*3/uL (ref 0.1–1.0)
Monocytes Relative: 8.1 % (ref 3.0–12.0)
Neutro Abs: 7.4 10*3/uL (ref 1.4–7.7)
Neutrophils Relative %: 79.1 % — ABNORMAL HIGH (ref 43.0–77.0)
Platelets: 313 10*3/uL (ref 150.0–400.0)
RBC: 4.44 Mil/uL (ref 3.87–5.11)
RDW: 14.8 % (ref 11.5–15.5)
WBC: 9.4 10*3/uL (ref 4.0–10.5)

## 2022-01-08 LAB — COMPREHENSIVE METABOLIC PANEL
ALT: 20 U/L (ref 0–35)
AST: 25 U/L (ref 0–37)
Albumin: 4.2 g/dL (ref 3.5–5.2)
Alkaline Phosphatase: 125 U/L — ABNORMAL HIGH (ref 39–117)
BUN: 9 mg/dL (ref 6–23)
CO2: 27 mEq/L (ref 19–32)
Calcium: 9.9 mg/dL (ref 8.4–10.5)
Chloride: 98 mEq/L (ref 96–112)
Creatinine, Ser: 0.95 mg/dL (ref 0.40–1.20)
GFR: 60.61 mL/min (ref >60.00)
Glucose, Bld: 119 mg/dL — ABNORMAL HIGH (ref 70–99)
Potassium: 4.1 mEq/L (ref 3.5–5.1)
Sodium: 136 mEq/L (ref 135–145)
Total Bilirubin: 0.9 mg/dL (ref 0.2–1.2)
Total Protein: 7 g/dL (ref 6.0–8.3)

## 2022-01-08 LAB — HEMOGLOBIN A1C: Hgb A1c MFr Bld: 5.3 % (ref 4.6–6.5)

## 2022-01-08 LAB — LIPID PANEL
Cholesterol: 192 mg/dL (ref 0–200)
HDL: 97.7 mg/dL (ref >39.00)
LDL Cholesterol: 56 mg/dL (ref 0–99)
NonHDL: 94.14
Total CHOL/HDL Ratio: 2
Triglycerides: 192 mg/dL — ABNORMAL HIGH (ref 0.0–149.0)
VLDL: 38.4 mg/dL (ref 0.0–40.0)

## 2022-01-08 LAB — TSH: TSH: 3.88 u[IU]/mL (ref 0.35–5.50)

## 2022-01-08 MED ORDER — AMLODIPINE BESYLATE 5 MG PO TABS: 5.0000 mg | ORAL_TABLET | Freq: Every day | ORAL | 2 refills | Status: DC

## 2022-01-08 MED ORDER — AMLODIPINE BESYLATE 2.5 MG PO TABS
2.5000 mg | ORAL_TABLET | Freq: Every day | ORAL | 1 refills | Status: DC
Start: 2022-01-08 — End: 2023-04-15

## 2022-01-08 NOTE — Assessment & Plan Note (Signed)
Excellent control.  Continue Lipitor 40 mg and Zetia 10 mg.  Pending lipid panel today ?

## 2022-01-08 NOTE — Assessment & Plan Note (Signed)
Excellent control.  Continue Prozac 40 mg.  She has not used propranolol 10 mg twice daily as needed for anxiety; will keep this on medication list for now ?

## 2022-01-08 NOTE — Assessment & Plan Note (Signed)
Chronic, stable.  Continue amlodipine 7.5 mg 

## 2022-01-08 NOTE — Patient Instructions (Signed)
Nice to see you!   

## 2022-01-08 NOTE — Progress Notes (Signed)
? ?Subjective:  ? ? [name], female    DOB: 06-23-1951, 71 y.o.   MRN: [id number] ? ?CC: Wanda Clayton is a 71 y.o. female who presents today for follow up.  ? ?HPI: Feels well today.  No new complaints ? ?Continues to follow with cardiology Dr. Rockey Situ last seen 09/11/2021 for atherosclerosis of aorta, hypertension, carotid stenosis.  She is compliant with zetia 10 mg, atorvastatin 40 mg ? ?Hypertension-compliant with amlodipine 5 mg. No cp, sob, leg swelling ?No formal exercise however she does do light housework and work in the yard. ? ?Anxiety-compliant with Prozac 40 mg which is working well for anxiety.  She hasnt used propranolol 10 mg bid prn for anxiety ? ?She is following with [facility] pulmonology for CT lung cancer screening program.  CT chest 11/08/2021, lung rads 2.  ?HISTORY:  ?Past Medical History:  ?Diagnosis Date  ? Acute pain of left foot 02/08/2020  ? Anxiety   ? Depression   ? Elevated liver enzymes 11/28/2020  ? Hypertension   ? ?Past Surgical History:  ?Procedure Laterality Date  ? carpal tunnel repair    ? COLONOSCOPY WITH PROPOFOL N/A 08/19/2021  ? Procedure: COLONOSCOPY WITH PROPOFOL;  Surgeon: Lin Landsman, MD;  [facility] ENDOSCOPY;  Service: Gastroenterology;  Laterality: N/A;  ? DENTAL SURGERY    ? TUBAL LIGATION    ? VAGINAL DELIVERY    ? x1  ? ?Family History  ?Problem Relation Age of Onset  ? Hypertension Mother   ? Colon cancer Mother 37  ? Atrial fibrillation Mother   ? Heart disease Father   ? Diabetes Father   ? Colon cancer Father 4  ? Congestive Heart Failure Father   ? COPD Sister   ? Crohn's disease Sister   ? Colon cancer Sister 69  ? Heart disease Brother   ? Heart attack Sister   ? Breast cancer Neg Hx   ? ? ?Allergies: Pollen extract ?Current Outpatient Medications on File Prior to Visit  ?Medication Sig Dispense Refill  ? aspirin 81 MG tablet Take 81 mg by mouth daily.    ? atorvastatin (LIPITOR) 40 MG tablet TAKE 1 TABLET BY MOUTH DAILY AT 6 PM.  90 tablet 1  ? ezetimibe (ZETIA) 10 MG tablet Take 1 tablet (10 mg total) by mouth daily. 90 tablet 3  ? FLUoxetine (PROZAC) 40 MG capsule TAKE 1 CAPSULE (40 MG TOTAL) BY MOUTH DAILY. 90 capsule 3  ? Multiple Vitamins-Minerals (EQ MULTIVITAMINS ADULT GUMMY PO) Take by mouth daily.    ? propranolol (INDERAL) 10 MG tablet propranolol 10 mg tablet ? TAKE 1 TABLET (10 MG TOTAL) BY MOUTH 2 (TWO) TIMES DAILY AS NEEDED (ANXIETY).    ? ?No current facility-administered medications on file prior to visit.  ? ? ?Social History  ? ?Tobacco Use  ? Smoking status: Every Day  ?  Packs/day: 1.00  ?  Years: 36.00  ?  Pack years: 36.00  ?  Types: Cigarettes  ? Smokeless tobacco: Never  ? Tobacco comments:  ?  she plans to quit without medication  ?Vaping Use  ? Vaping Use: Never used  ?Substance Use Topics  ? Alcohol use: Yes  ?  Comment: Daily glass wine x2  ? Drug use: No  ? ? ?Review of Systems  ?Constitutional:  Negative for chills and fever.  ?Respiratory:  Negative for cough.   ?Cardiovascular:  Negative for chest pain, palpitations and leg swelling.  ?Gastrointestinal:  Negative  for nausea and vomiting.  ?   ?Objective:  ?  ?BP 130/82 (BP Location: Left Arm, Patient Position: Sitting, Cuff Size: Normal)   Pulse (!) 102   Temp 97.7 ?F (36.5 ?C) (Oral)   Ht $R'5\' 5"'VD$  (1.651 m)   Wt 137 lb 6.4 oz (62.3 kg)   SpO2 99%   BMI 22.86 kg/m?  ?BP Readings from Last 3 Encounters:  ?01/08/22 130/82  ?09/11/21 (!) 162/78  ?09/09/21 (!) 160/70  ? ?Wt Readings from Last 3 Encounters:  ?01/08/22 137 lb 6.4 oz (62.3 kg)  ?11/08/21 142 lb (64.4 kg)  ?09/11/21 145 lb (65.8 kg)  ? ? ?Physical Exam ?Vitals reviewed.  ?Constitutional:   ?   Appearance: She is well-developed.  ?Eyes:  ?   Conjunctiva/sclera: Conjunctivae normal.  ?Cardiovascular:  ?   Rate and Rhythm: Normal rate and regular rhythm.  ?   Pulses: Normal pulses.  ?   Heart sounds: Normal heart sounds.  ?Pulmonary:  ?   Effort: Pulmonary effort is normal.  ?   Breath sounds: Normal  breath sounds. No wheezing, rhonchi or rales.  ?Skin: ?   General: Skin is warm and dry.  ?Neurological:  ?   Mental Status: She is alert.  ?Psychiatric:     ?   Speech: Speech normal.     ?   Behavior: Behavior normal.     ?   Thought Content: Thought content normal.  ? ? ?   ?Assessment & Plan:  ? ?Problem List Items Addressed This Visit   ? ?  ? Cardiovascular and Mediastinum  ? HTN (hypertension)  ?  Chronic, stable.  Continue amlodipine 7.5 mg ?  ?  ? Relevant Medications  ? amLODipine (NORVASC) 5 MG tablet  ? amLODipine (NORVASC) 2.5 MG tablet  ?  ? Other  ? GAD (generalized anxiety disorder)  ?  Excellent control.  Continue Prozac 40 mg.  She has not used propranolol 10 mg twice daily as needed for anxiety; will keep this on medication list for now ?  ?  ? Hyperlipidemia  ?  Excellent control.  Continue Lipitor 40 mg and Zetia 10 mg.  Pending lipid panel today ?  ?  ? Relevant Medications  ? amLODipine (NORVASC) 5 MG tablet  ? amLODipine (NORVASC) 2.5 MG tablet  ? Other Relevant Orders  ? Lipid panel  ? Routine general medical examination at a health care facility  ? Relevant Orders  ? Comp Met (CMET)  ? TSH  ? HgB A1c  ? CBC w/Diff  ? ?Other Visit Diagnoses   ? ? Essential hypertension    -  Primary  ? Relevant Medications  ? amLODipine (NORVASC) 5 MG tablet  ? amLODipine (NORVASC) 2.5 MG tablet  ? ?  ? ? ? ?I have changed Wanda Clayton's amLODipine. I am also having her start on amLODipine. Additionally, I am having her maintain her aspirin, Multiple Vitamins-Minerals (EQ MULTIVITAMINS ADULT GUMMY PO), propranolol, atorvastatin, ezetimibe, and FLUoxetine. ? ? ?Meds ordered this encounter  ?Medications  ? amLODipine (NORVASC) 5 MG tablet  ?  Sig: Take 1 tablet (5 mg total) by mouth daily.  ?  Dispense:  90 tablet  ?  Refill:  2  ?  Order Specific Question:   Supervising Provider  ?  Answer:   Crecencio Mc [[id number]]  ? amLODipine (NORVASC) 2.5 MG tablet  ?  Sig: Take 1 tablet (2.5 mg total) by mouth daily.   ?  Dispense:  90 tablet  ?  Refill:  1  ?  Order Specific Question:   Supervising Provider  ?  Answer:   Crecencio Mc [[id number]]  ? ? ?Return precautions given.  ? ?Risks, benefits, and alternatives of the medications and treatment plan prescribed today were discussed, and patient expressed understanding.  ? ?Education regarding symptom management and diagnosis given to patient on AVS. ? ?Continue to follow with Burnard Hawthorne, FNP for routine health maintenance.  ? ?Carolyn Stare and I agreed with plan.  ? ?[name], FNP ? ? ?

## 2022-01-12 ENCOUNTER — Other Ambulatory Visit: Payer: Self-pay | Admitting: Family

## 2022-01-12 DIAGNOSIS — R899 Unspecified abnormal finding in specimens from other organs, systems and tissues: Secondary | ICD-10-CM

## 2022-02-06 ENCOUNTER — Telehealth: Payer: Self-pay

## 2022-02-06 NOTE — Telephone Encounter (Signed)
LMTCB to office to go over mychart results ?

## 2022-02-06 NOTE — Telephone Encounter (Signed)
Pt returning call. Pt requesting callback.  °

## 2022-02-06 NOTE — Telephone Encounter (Signed)
Spoke to patient and went over results and scheduled appointment for 5/23  ?

## 2022-02-28 ENCOUNTER — Other Ambulatory Visit: Payer: Self-pay

## 2022-02-28 DIAGNOSIS — E785 Hyperlipidemia, unspecified: Secondary | ICD-10-CM

## 2022-02-28 MED ORDER — ATORVASTATIN CALCIUM 40 MG PO TABS: ORAL_TABLET | ORAL | 3 refills | Status: DC

## 2022-03-04 ENCOUNTER — Other Ambulatory Visit (INDEPENDENT_AMBULATORY_CARE_PROVIDER_SITE_OTHER): Payer: No Typology Code available for payment source

## 2022-03-04 DIAGNOSIS — R899 Unspecified abnormal finding in specimens from other organs, systems and tissues: Secondary | ICD-10-CM | POA: Diagnosis not present

## 2022-03-04 LAB — GAMMA GT: GGT: 54 U/L — ABNORMAL HIGH (ref 7–51)

## 2022-03-04 LAB — HEPATIC FUNCTION PANEL
ALT: 24 U/L (ref 0–35)
AST: 32 U/L (ref 0–37)
Albumin: 4.2 g/dL (ref 3.5–5.2)
Alkaline Phosphatase: 111 U/L (ref 39–117)
Bilirubin, Direct: 0.1 mg/dL (ref 0.0–0.3)
Total Bilirubin: 0.5 mg/dL (ref 0.2–1.2)
Total Protein: 6.9 g/dL (ref 6.0–8.3)

## 2022-03-04 LAB — CBC WITH DIFFERENTIAL/PLATELET
Basophils Absolute: 0 10*3/uL (ref 0.0–0.1)
Basophils Relative: 0.7 % (ref 0.0–3.0)
Eosinophils Absolute: 0.1 10*3/uL (ref 0.0–0.7)
Eosinophils Relative: 1.8 % (ref 0.0–5.0)
HCT: 41.2 % (ref 36.0–46.0)
Hemoglobin: 13.9 g/dL (ref 12.0–15.0)
Lymphocytes Relative: 32.9 % (ref 12.0–46.0)
Lymphs Abs: 1.7 10*3/uL (ref 0.7–4.0)
MCHC: 33.6 g/dL (ref 30.0–36.0)
MCV: 102.5 fl — ABNORMAL HIGH (ref 78.0–100.0)
Monocytes Absolute: 0.6 10*3/uL (ref 0.1–1.0)
Monocytes Relative: 11.2 % (ref 3.0–12.0)
Neutro Abs: 2.8 10*3/uL (ref 1.4–7.7)
Neutrophils Relative %: 53.4 % (ref 43.0–77.0)
Platelets: 254 10*3/uL (ref 150.0–400.0)
RBC: 4.02 Mil/uL (ref 3.87–5.11)
RDW: 13.9 % (ref 11.5–15.5)
WBC: 5.3 10*3/uL (ref 4.0–10.5)

## 2022-03-07 LAB — ALKALINE PHOSPHATASE, ISOENZYMES
Alkaline Phosphatase: 133 IU/L — ABNORMAL HIGH (ref 44–121)
BONE FRACTION: 20 % (ref 14–68)
INTESTINAL FRAC.: 0 % (ref 0–18)
LIVER FRACTION: 80 % (ref 18–85)

## 2022-03-11 ENCOUNTER — Encounter: Payer: Self-pay | Admitting: Family

## 2022-03-11 ENCOUNTER — Telehealth: Payer: Self-pay

## 2022-03-11 NOTE — Telephone Encounter (Signed)
Patient states she received a message via MyChart saying she may have liver disease.  Please call.

## 2022-03-11 NOTE — Telephone Encounter (Signed)
I called and spoke with patient. She would like [place] of liver ordered so she can go ahead & have that done first to see what they will reveal.

## 2022-03-12 ENCOUNTER — Other Ambulatory Visit: Payer: Self-pay | Admitting: Family

## 2022-03-12 DIAGNOSIS — R748 Abnormal levels of other serum enzymes: Secondary | ICD-10-CM

## 2022-03-12 NOTE — Telephone Encounter (Signed)
[place] ruq ordered

## 2022-03-13 ENCOUNTER — Ambulatory Visit (INDEPENDENT_AMBULATORY_CARE_PROVIDER_SITE_OTHER): Payer: No Typology Code available for payment source

## 2022-03-13 VITALS — Ht 65.0 in | Wt 137.0 lb

## 2022-03-13 DIAGNOSIS — Z0000 Encounter for general adult medical examination without abnormal findings: Secondary | ICD-10-CM

## 2022-03-13 NOTE — Patient Instructions (Addendum)
  Wanda Clayton , Thank you for taking time to come for your Medicare Wellness Visit. I appreciate your ongoing commitment to your health goals. Please review the following plan we discussed and let me know if I can assist you in the future.   These are the goals we discussed:  Goals       Patient Stated     Quit smoking (pt-stated)      Plans to decrease/stop smoking soon when life stressors settle a bit more      Weight goal 135lb (pt-stated)      Stay active Healthy diet        This is a list of the screening recommended for you and due dates:  Health Maintenance  Topic Date Due   COVID-19 Vaccine (3 - Booster for Pfizer series) 03/29/2022*   Zoster (Shingles) Vaccine (1 of 2) 04/10/2022*   Flu Shot  05/13/2022   Mammogram  08/16/2023   Tetanus Vaccine  02/12/2026   Colon Cancer Screening  08/19/2026   DEXA scan (bone density measurement)  Completed   Hepatitis C Screening: USPSTF Recommendation to screen - Ages 56-79 yo.  Completed   HPV Vaccine  Aged Out   Pneumonia Vaccine  Discontinued  *Topic was postponed. The date shown is not the original due date.

## 2022-03-13 NOTE — Progress Notes (Signed)
Subjective:   Wanda Clayton is a 71 y.o. female who presents for Medicare Annual (Subsequent) preventive examination.  Review of Systems    No ROS.  Medicare Wellness Virtual Visit.  Visual/audio telehealth visit, UTA vital signs.   See social history for additional risk factors.         Objective:    Today's Vitals   03/13/22 1546  Weight: 137 lb (62.1 kg)  Height: '5\' 5"'$  (1.651 m)   Body mass index is 22.8 kg/m.     03/13/2022    4:12 PM 08/19/2021    9:32 AM 03/12/2021   10:41 AM 02/28/2020   10:46 AM 10/01/2019    7:31 AM 10/11/2018   11:04 AM 10/07/2017    8:46 AM  Advanced Directives  Does Patient Have a Medical Advance Directive? Yes Yes Yes Yes No Yes Yes  Type of Paramedic of [place];Living will [place];Living will Healthcare Power of [place];Living will  Living will;Healthcare Power of [place];Living will  Does patient want to make changes to medical advance directive? No - Patient declined  No - Patient declined No - Patient declined  No - Patient declined   Copy of [place] in Chart? No - copy requested  No - copy requested No - copy requested  No - copy requested No - copy requested    Current Medications (verified) Outpatient Encounter Medications as of 03/13/2022  Medication Sig   amLODipine (NORVASC) 2.5 MG tablet Take 1 tablet (2.5 mg total) by mouth daily.   amLODipine (NORVASC) 5 MG tablet Take 1 tablet (5 mg total) by mouth daily.   aspirin 81 MG tablet Take 81 mg by mouth daily.   atorvastatin (LIPITOR) 40 MG tablet TAKE 1 TABLET BY MOUTH DAILY AT 6 PM.   ezetimibe (ZETIA) 10 MG tablet Take 1 tablet (10 mg total) by mouth daily.   FLUoxetine (PROZAC) 40 MG capsule TAKE 1 CAPSULE (40 MG TOTAL) BY MOUTH DAILY.   Multiple Vitamins-Minerals (EQ MULTIVITAMINS ADULT GUMMY PO) Take by mouth daily.   propranolol (INDERAL) 10 MG tablet  propranolol 10 mg tablet  TAKE 1 TABLET (10 MG TOTAL) BY MOUTH 2 (TWO) TIMES DAILY AS NEEDED (ANXIETY).   No facility-administered encounter medications on file as of 03/13/2022.    Allergies (verified) Pollen extract   History: Past Medical History:  Diagnosis Date   Acute pain of left foot 02/08/2020   Anxiety    Depression    Elevated liver enzymes 11/28/2020   Hypertension    Past Surgical History:  Procedure Laterality Date   carpal tunnel repair     COLONOSCOPY WITH PROPOFOL N/A 08/19/2021   Procedure: COLONOSCOPY WITH PROPOFOL;  Surgeon: Lin Landsman, MD;  Location: [facility] END[facility];  Service: Gastroenterology;  Laterality: N/A;   DENTAL SURGERY     TUBAL LIGATION     VAGINAL DELIVERY     x1   Family History  Problem Relation Age of Onset   Hypertension Mother    Colon cancer Mother 75   Atrial fibrillation Mother    Heart disease Father    Diabetes Father    Colon cancer Father 27   Congestive Heart Failure Father    COPD Sister    Crohn's disease Sister    Colon cancer Sister 70   Heart disease Brother    Heart attack Sister    Breast cancer Neg Hx  Social History   Socioeconomic History   Marital status: Married    Spouse name: Not on file   Number of children: 1   Years of education: Not on file   Highest education level: Not on file  Occupational History    Employer: [employer]  Tobacco Use   Smoking status: Every Day    Packs/day: 1.00    Years: 36.00    Pack years: 36.00    Types: Cigarettes   Smokeless tobacco: Never   Tobacco comments:    she plans to quit without medication  Vaping Use   Vaping Use: Never used  Substance and Sexual Activity   Alcohol use: Yes    Comment: Daily glass wine x2   Drug use: No   Sexual activity: Not Currently    Birth control/protection: None, Post-menopausal  Other Topics Concern   Not on file  Social History Narrative   Works for [employer], retired 06/2016   Married.   Daughter and  granddaughter.          Social Determinants of Health   Financial Resource Strain: Low Risk    Difficulty of Paying Living Expenses: Not hard at all  Food Insecurity: No Food Insecurity   Worried About Charity fundraiser in the Last Year: Never true   [place] in the Last Year: Never true  Transportation Needs: No Transportation Needs   Lack of Transportation (Medical): No   Lack of Transportation (Non-Medical): No  Physical Activity: Not on file  Stress: No Stress Concern Present   Feeling of Stress : Only a little  Social Connections: Unknown   Frequency of Communication with Friends and Family: More than three times a week   Frequency of Social Gatherings with Friends and Family: More than three times a week   Attends Religious Services: More than 4 times per year   Active Member of [employer] or Organizations: Not on file   Attends Archivist Meetings: Not on file   Marital Status: Not on file    Tobacco Counseling Ready to quit: Not Answered Counseling given: Not Answered Tobacco comments: she plans to quit without medication  Clinical Intake: Pre-visit preparation completed: Yes        Diabetes: No  How often do you need to have someone help you when you read instructions, pamphlets, or other written materials from your doctor or pharmacy?: 1 - Never  Interpreter Needed?: No    Activities of Daily Living    03/13/2022    3:50 PM  In your present state of health, do you have any difficulty performing the following activities:  Hearing? 1  Comment Hearing aid  Vision? 0  Difficulty concentrating or making decisions? 0  Walking or climbing stairs? 0  Dressing or bathing? 0  Doing errands, shopping? 0  Preparing Food and eating ? N  Using the Toilet? N  In the past six months, have you accidently leaked urine? N  Do you have problems with loss of bowel control? N  Managing your Medications? N  Managing your Finances? N  Housekeeping or  managing your Housekeeping? Y  Comment Maid assist   Patient Care Team: Burnard Hawthorne, FNP as PCP - General (Family Medicine)  Indicate any recent Medical Services you may have received from other than [facility] providers in the past year (date may be approximate).     Assessment:   This is a routine wellness examination for Wanda Clayton.  Virtual  Visit via Telephone Note  I connected with  Wanda Clayton on 03/13/22 at  3:45 PM EDT by telephone and verified that I am speaking with the correct person using two identifiers.  Persons participating in the virtual visit: patient/Nurse Health Advisor   I discussed the limitations of performing an evaluation and management service by telehealth. We continued and completed visit with audio only. Some vital signs may be absent or patient reported.   Hearing/Vision screen Hearing Screening - Comments:: Followed by [facility]  Hearing aid, bilateral Vision Screening - Comments:: Followed by Dr. Gloriann Loan Wears corrective lenses They have regular follow up with the ophthalmologist  Dietary issues and exercise activities discussed:   Weight Watchers diet Good water intake   Goals Addressed               This Visit's Progress     Patient Stated     Weight goal 135lb (pt-stated)        Stay active Healthy diet       Depression Screen    03/13/2022    3:58 PM 01/08/2022    8:34 AM 09/09/2021   10:34 AM 03/12/2021   10:40 AM 11/28/2020   11:02 AM 06/29/2020    9:12 AM 02/28/2020   10:49 AM  PHQ 2/9 Scores  PHQ - 2 Score 0 0 0 0 0 0 0  PHQ- 9 Score  0 0   1     Fall Risk    03/13/2022    4:01 PM 01/08/2022    8:33 AM 03/12/2021   10:43 AM 11/28/2020   11:02 AM 02/28/2020   10:48 AM  Fall Risk   Falls in the past year? 1 1 0 1   Comment     None since last reported 2 weeks ago  Number falls in past yr: 0 1 0 0   Injury with Fall? 0 1 0 1   Risk for fall due to :  History of fall(s)     Follow up Falls evaluation completed Falls  evaluation completed Falls evaluation completed Falls evaluation completed Falls evaluation completed   [place]: Home free of loose throw rugs in walkways, pet beds, electrical cords, etc? Yes  Adequate lighting in your home to reduce risk of falls? Yes   ASSISTIVE DEVICES UTILIZED TO PREVENT FALLS: Life alert? No  Use of a cane, walker or w/c? No  Grab bars in the bathroom? Yes  Shower chair or bench in shower? Yes  Elevated toilet seat or a handicapped toilet? Yes   TIMED UP AND GO: Was the test performed? No .   Cognitive Function: Patient is alert and oriented x3.  Enjoys brain health stimulating activities like solitaire, reading and more.     10/07/2017    8:49 AM  MMSE - Mini Mental State Exam  Orientation to time 5  Orientation to Place 5  Registration 3  Attention/ Calculation 5  Recall 3  Language- name 2 objects 2  Language- repeat 1  Language- follow 3 step command 3  Language- read & follow direction 1  Write a sentence 1  Copy design 1  Total score 30        02/28/2020   10:58 AM 10/11/2018   11:36 AM  6CIT Screen  What Year? 0 points 0 points  What month? 0 points 0 points  What time? 0 points 0 points  Count back from 20  0 points  Months in reverse  0 points  Repeat phrase  0 points  Total Score  0 points    Immunizations Immunization History  Administered Date(s) Administered   PFIZER(Purple Top)SARS-COV-2 Vaccination 01/06/2020, 01/27/2020   Td 02/13/2016   Screening Tests Health Maintenance  Topic Date Due   COVID-19 Vaccine (3 - Booster for Pfizer series) 03/29/2022 (Originally 03/23/2020)   Zoster Vaccines- Shingrix (1 of 2) 04/10/2022 (Originally 04/14/2001)   INFLUENZA VACCINE  05/13/2022   MAMMOGRAM  08/16/2023   TETANUS/TDAP  02/12/2026   COLONOSCOPY (Pts 45-67yr Insurance coverage will need to be confirmed)  08/19/2026   DEXA SCAN  Completed   Hepatitis C Screening  Completed   HPV VACCINES   Aged Out   Pneumonia Vaccine 71 Years old  Discontinued   Health Maintenance There are no preventive care reminders to display for this patient.  Lung Cancer Screening: completed 11/08/21  Vision Screening: Recommended annual ophthalmology exams for early detection of glaucoma and other disorders of the eye.  Dental Screening: Recommended annual dental exams for proper oral hygiene  Community Resource Referral / Chronic Care Management: CRR required this visit?  No   CCM required this visit?  No      Plan:   Keep all routine maintenance appointments.   I have personally reviewed and noted the following in the patient's chart:   Medical and social history Use of alcohol, tobacco or illicit drugs  Current medications and supplements including opioid prescriptions.  Functional ability and status Nutritional status Physical activity Advanced directives List of other physicians Hospitalizations, surgeries, and ER visits in previous 12 months Vitals Screenings to include cognitive, depression, and falls Referrals and appointments  In addition, I have reviewed and discussed with patient certain preventive protocols, quality metrics, and best practice recommendations. A written personalized care plan for preventive services as well as general preventive health recommendations were provided to patient.     OVarney Biles LPN   60/11/5850

## 2022-03-18 ENCOUNTER — Ambulatory Visit
Admission: RE | Admit: 2022-03-18 | Discharge: 2022-03-18 | Disposition: A | Discharge status: Home / Self Care | Payer: No Typology Code available for payment source | Source: Ambulatory Visit | Attending: Family | Admitting: Family

## 2022-03-18 ENCOUNTER — Encounter: Payer: Self-pay | Admitting: Family

## 2022-03-18 DIAGNOSIS — R932 Abnormal findings on diagnostic imaging of liver and biliary tract: Secondary | ICD-10-CM | POA: Diagnosis not present

## 2022-03-18 DIAGNOSIS — R748 Abnormal levels of other serum enzymes: Secondary | ICD-10-CM | POA: Insufficient documentation

## 2022-04-18 ENCOUNTER — Other Ambulatory Visit: Payer: Self-pay | Admitting: Family

## 2022-04-18 DIAGNOSIS — I10 Essential (primary) hypertension: Secondary | ICD-10-CM

## 2022-06-03 ENCOUNTER — Other Ambulatory Visit: Payer: Self-pay

## 2022-06-11 ENCOUNTER — Ambulatory Visit (INDEPENDENT_AMBULATORY_CARE_PROVIDER_SITE_OTHER): Payer: No Typology Code available for payment source | Admitting: Gastroenterology

## 2022-06-11 ENCOUNTER — Encounter: Payer: Self-pay | Admitting: Gastroenterology

## 2022-06-11 VITALS — BP 152/74 | HR 83 | Temp 98.2°F | Ht 65.0 in | Wt 140.0 lb

## 2022-06-11 DIAGNOSIS — R748 Abnormal levels of other serum enzymes: Secondary | ICD-10-CM

## 2022-06-11 DIAGNOSIS — R7989 Other specified abnormal findings of blood chemistry: Secondary | ICD-10-CM | POA: Diagnosis not present

## 2022-06-11 NOTE — Progress Notes (Signed)
Cephas Darby, MD [address]  [place], [place] [address]  Main: [phone]  Fax: [phone]    Gastroenterology Consultation  Referring Provider:     Burnard Hawthorne, FNP Primary Care Physician:  Burnard Hawthorne, FNP Primary Gastroenterologist:  Dr. Cephas Darby Reason for Consultation:    Elevated alkaline phosphatase levels        HPI:   Wanda Clayton is a 71 y.o. female referred by Dr. Burnard Hawthorne, FNP  for consultation & management of stool occult positive.  Had a stool sample sent in by the home health company and she received elected from them stating that there was blood in her stool.  She contacted her PCP and is therefore referred to GI to discuss about colonoscopy.  Patient has history of collagenous colitis, not on any treatment.  She was offered to be treated in the past, she preferred not to take any medication.  She reports that her bowel movements are 2-3 times daily and she is not bothered.  She is trying to lose weight.  She denies any GI symptoms today.  Her labs are unremarkable.   Follow-up visit 06/11/2022 Patient is referred to me by her PCP due to elevated ALT and phosphatase levels along with elevated GGT levels.  She underwent right upper quadrant ultrasound which revealed Abnormal appearance of the liver parenchyma suggesting hepatic steatosis and/or other hepatocellular disease. Patient denies any GI symptoms.  She does admit to drinking 2 to 3 glasses of wine daily, 7 days repeat along with her husband.  She continues to smoke tobacco.  NSAIDs: None  Antiplts/Anticoagulants/Anti thrombotics: None  GI Procedures:  Colonoscopy 07/23/2011 Diagnosis:  Part A: ASCENDING COLON POLYP HOT SNARE:  - HYPERPLASTIC POLYP, MULTIPLE FRAGMENTS.  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  Marland Kitchen  NOTE: There is a previous pathology report describing similar   histology, [phone] (03/07/10), and clinical correlation is  advised.  .  Part C: TRANSVERSE COLON POLYP HOT SNARE:  - HYPERPLASTIC POLYP.  .  Part D: TRANSVERSE COLON POLYP HOT SNARE:  - HYPERPLASTIC POLYP, 3 pieces.   Colonoscopy 08/19/2021 - One 6 mm polyp in the ascending colon, removed with a cold snare. Resected and retrieved. - The distal rectum and anal verge are normal on retroflexion view. DIAGNOSIS:  A.  COLON POLYP, ASCENDING; COLD SNARE:  - SESSILE SERRATED POLYP, MULTIPLE FRAGMENTS.  - NEGATIVE FOR DYSPLASIA AND MALIGNANCY.  Past Medical History:  Diagnosis Date   Acute pain of left foot 02/08/2020   Anxiety    Depression    Elevated liver enzymes 11/28/2020   Hypertension     Past Surgical History:  Procedure Laterality Date   carpal tunnel repair     COLONOSCOPY WITH PROPOFOL N/A 08/19/2021   Procedure: COLONOSCOPY WITH PROPOFOL;  Surgeon: Lin Landsman, MD;  Location: [facility] ENDOSCOPY;  Service: Gastroenterology;  Laterality: N/A;   DENTAL SURGERY     TUBAL LIGATION     VAGINAL DELIVERY     x1    Current Outpatient Medications:    amLODipine (NORVASC) 2.5 MG tablet, Take 1 tablet (2.5 mg total) by mouth daily., Disp: 90 tablet, Rfl: 1   amLODipine (NORVASC) 5 MG tablet, TAKE 1 AND 1/2 TABLETS BY MOUTH DAILY, Disp: 135 tablet, Rfl: 1   aspirin 81 MG tablet, Take 81 mg by mouth daily., Disp: , Rfl:  atorvastatin (LIPITOR) 40 MG tablet, TAKE 1 TABLET BY MOUTH DAILY AT 6 PM., Disp: 90 tablet, Rfl: 3   ezetimibe (ZETIA) 10 MG tablet, Take 1 tablet (10 mg total) by mouth daily., Disp: 90 tablet, Rfl: 3   FLUoxetine (PROZAC) 40 MG capsule, TAKE 1 CAPSULE (40 MG TOTAL) BY MOUTH DAILY., Disp: 90 capsule, Rfl: 3   Multiple Vitamins-Minerals (EQ MULTIVITAMINS ADULT GUMMY PO), Take by mouth daily., Disp: , Rfl:   Family History  Problem Relation Age of Onset   Hypertension Mother    Colon cancer Mother 12   Atrial fibrillation Mother    Heart disease  Father    Diabetes Father    Colon cancer Father 63   Congestive Heart Failure Father    COPD Sister    Crohn's disease Sister    Colon cancer Sister 34   Heart disease Brother    Heart attack Sister    Breast cancer Neg Hx      Social History   Tobacco Use   Smoking status: Every Day    Packs/day: 1.00    Years: 36.00    Total pack years: 36.00    Types: Cigarettes   Smokeless tobacco: Never   Tobacco comments:    she plans to quit without medication  Vaping Use   Vaping Use: Never used  Substance Use Topics   Alcohol use: Yes    Comment: Daily glass wine x2   Drug use: No    Allergies as of 06/11/2022 - Review Complete 06/11/2022  Allergen Reaction Noted   Pollen extract Itching 03/13/2014    Review of Systems:    All systems reviewed and negative except where noted in HPI.   Physical Exam:  BP (!) 152/74 (BP Location: Left Arm, Patient Position: Sitting, Cuff Size: Normal)   Pulse 83   Temp 98.2 F (36.8 C) (Oral)   Ht '5\' 5"'$  (1.651 m)   Wt 140 lb (63.5 kg)   BMI 23.30 kg/m  No LMP recorded. Patient is postmenopausal.  General:   Alert,  Well-developed, well-nourished, pleasant and cooperative in NAD Head:  Normocephalic and atraumatic. Eyes:  Sclera clear, no icterus.   Conjunctiva pink. Ears:  Normal auditory acuity. Nose:  No deformity, discharge, or lesions. Mouth:  No deformity or lesions,oropharynx pink & moist. Neck:  Supple; no masses or thyromegaly. Lungs:  Respirations even and unlabored.  Clear throughout to auscultation.   No wheezes, crackles, or rhonchi. No acute distress. Heart:  Regular rate and rhythm; no murmurs, clicks, rubs, or gallops. Abdomen:  Normal bowel sounds. Soft, non-tender and non-distended without masses, hepatosplenomegaly or hernias noted.  No guarding or rebound tenderness.   Rectal: Not performed Msk:  Symmetrical without gross deformities. Good, equal movement & strength bilaterally. Pulses:  Normal pulses  noted. Extremities:  No clubbing or edema.  No cyanosis. Neurologic:  Alert and oriented x3;  grossly normal neurologically. Skin:  Intact without significant lesions or rashes. No jaundice. Psych:  Alert and cooperative. Normal mood and affect.  Imaging Studies: No abdominal imaging  Assessment and Plan:   Wanda Clayton is a 71 y.o. pleasant Caucasian female with history of hypertension, chronic tobacco use, chronic alcohol use is seen in consultation for elevated alkaline phosphatase levels and GGT levels with heterogeneous appearance of liver based on right upper quadrant ultrasound  Recommend secondary liver disease work-up, labs ordered today Discussed with patient regarding ultrasound-guided liver biopsy based on the above work-up Advised patient to cut back  on drinking wine from 2 to 3 glasses daily to 1 to 2 glasses daily    Follow up based on the above work-up   Cephas Darby, MD

## 2022-06-17 ENCOUNTER — Other Ambulatory Visit
Admission: RE | Admit: 2022-06-17 | Discharge: 2022-06-17 | Disposition: A | Discharge status: Home / Self Care | Payer: No Typology Code available for payment source | Attending: Gastroenterology | Admitting: Gastroenterology

## 2022-06-17 DIAGNOSIS — R7989 Other specified abnormal findings of blood chemistry: Secondary | ICD-10-CM | POA: Diagnosis not present

## 2022-06-17 LAB — FERRITIN: Ferritin: 19 ng/mL (ref 11–307)

## 2022-06-17 LAB — HEPATITIS B SURFACE ANTIGEN: Hepatitis B Surface Ag: NONREACTIVE

## 2022-06-17 LAB — HEPATIC FUNCTION PANEL
ALT: 32 U/L (ref 0–44)
AST: 40 U/L (ref 15–41)
Albumin: 4.1 g/dL (ref 3.5–5.0)
Alkaline Phosphatase: 101 U/L (ref 38–126)
Bilirubin, Direct: <0.1 mg/dL (ref 0.0–0.2)
Total Bilirubin: 0.8 mg/dL (ref 0.3–1.2)
Total Protein: 7.8 g/dL (ref 6.5–8.1)

## 2022-06-17 LAB — HEPATITIS B CORE ANTIBODY, TOTAL: Hep B Core Total Ab: NONREACTIVE

## 2022-06-17 LAB — HEPATITIS C ANTIBODY: HCV Ab: NONREACTIVE

## 2022-06-17 LAB — HEPATITIS A ANTIBODY, TOTAL: hep A Total Ab: REACTIVE — AB

## 2022-06-18 LAB — ANA: Anti Nuclear Antibody (ANA): NEGATIVE

## 2022-06-18 LAB — MISC LABCORP TEST (SEND OUT): Labcorp test code: 6395

## 2022-06-18 LAB — CERULOPLASMIN: Ceruloplasmin: 23.6 mg/dL (ref 19.0–39.0)

## 2022-06-18 LAB — ANTI-SMOOTH MUSCLE ANTIBODY, IGG: F-Actin IgG: 81 Units — ABNORMAL HIGH (ref 0–19)

## 2022-06-18 LAB — ALPHA-1-ANTITRYPSIN: A-1 Antitrypsin, Ser: 123 mg/dL (ref 101–187)

## 2022-06-18 LAB — MITOCHONDRIAL ANTIBODIES: Mitochondrial M2 Ab, IgG: <20 Units (ref 0.0–20.0)

## 2022-06-19 ENCOUNTER — Telehealth: Payer: Self-pay

## 2022-06-19 DIAGNOSIS — R7989 Other specified abnormal findings of blood chemistry: Secondary | ICD-10-CM

## 2022-06-19 LAB — CELIAC DISEASE PANEL
Endomysial Ab, IgA: NEGATIVE
IgA: 465 mg/dL — ABNORMAL HIGH (ref 64–422)
Tissue Transglutaminase Ab, IgA: <2 U/mL (ref 0–3)

## 2022-06-19 NOTE — Telephone Encounter (Signed)
-----   Message from Lin Landsman, MD sent at 06/18/2022  4:35 PM EDT ----- Caryl Pina  Please inform patient that the work-up for secondary causes of elevated liver enzymes came back showing that one of the autoimmune antibodies came back elevated.  Therefore, I recommend ultrasound-guided liver biopsy which I have discussed during office visit.  Please schedule the liver biopsy if patient is agreeable.  Wanda Clayton

## 2022-06-19 NOTE — Telephone Encounter (Signed)
Patient verbalized understanding of results. She states she can do the liver biopsy but she will be out of town September 15-19 and October 1-5. Her Husband is having major heart surgery on October 26.. Patient will need to stop the Baby Asprin 5 days before procedure. Called [facility] told them I faxed the order to them

## 2022-06-20 ENCOUNTER — Encounter: Payer: Self-pay | Admitting: Family

## 2022-06-20 ENCOUNTER — Ambulatory Visit: Payer: No Typology Code available for payment source | Admitting: Family

## 2022-06-24 NOTE — Telephone Encounter (Signed)
[facility] called and got patient schedule for the ultrasound on 07/08/2022 arrive at 9:00am for a 10:00am scan. Called patient and informed patient of this information and she verbalized understanding.

## 2022-06-27 NOTE — Progress Notes (Signed)
Patient on schedule for Liver biopsy 9/26, called and spoke with husband on phone with pre procedure instructions given. Made aware to be here at 0900, NPO after [place] prior to procedure and driver post procedure/recovery/discharge. Stated understanding. Also holding Aspirin LD 9/20, stated understanding.

## 2022-07-03 DIAGNOSIS — H43813 Vitreous degeneration, bilateral: Secondary | ICD-10-CM | POA: Diagnosis not present

## 2022-07-07 ENCOUNTER — Other Ambulatory Visit (HOSPITAL_COMMUNITY): Payer: Self-pay | Admitting: Radiology

## 2022-07-07 DIAGNOSIS — R748 Abnormal levels of other serum enzymes: Secondary | ICD-10-CM

## 2022-07-07 NOTE — H&P (Signed)
Chief Complaint: Patient was seen in consultation today for abnormal liver enzymes   at the request of Lin Landsman  Referring Physician(s): Lin Landsman  Supervising Physician: Corrie Mckusick  Patient Status: [facility] - Out-pt  History of Present Illness: Wanda Clayton is a 71 y.o. female was referred by PCP to gastroenterology for elevated ALT, phosphatase and GGT levels. Pt underwent RUQ [place] 03/18/22 that showed abnormal appearance of liver parenchyma suggesting hepatic steatosis and/or other hepatocellular disease. Pt was referred to IR for random liver biopsy by Dr. Marius Ditch.   Past Medical History:  Diagnosis Date   Acute pain of left foot 02/08/2020   Anxiety    Depression    Elevated liver enzymes 11/28/2020   Hypertension     Past Surgical History:  Procedure Laterality Date   carpal tunnel repair     COLONOSCOPY WITH PROPOFOL N/A 08/19/2021   Procedure: COLONOSCOPY WITH PROPOFOL;  Surgeon: Lin Landsman, MD;  Location: [facility] ENDOSCOPY;  Service: Gastroenterology;  Laterality: N/A;   DENTAL SURGERY     TUBAL LIGATION     VAGINAL DELIVERY     x1    Allergies: Pollen extract  Medications: Prior to Admission medications   Medication Sig Start Date End Date Taking? Authorizing Provider  amLODipine (NORVASC) 2.5 MG tablet Take 1 tablet (2.5 mg total) by mouth daily. 01/08/22   Burnard Hawthorne, FNP  amLODipine (NORVASC) 5 MG tablet TAKE 1 AND 1/2 TABLETS BY MOUTH DAILY 04/18/22   Burnard Hawthorne, FNP  aspirin 81 MG tablet Take 81 mg by mouth daily.    [provider]  atorvastatin (LIPITOR) 40 MG tablet TAKE 1 TABLET BY MOUTH DAILY AT 6 PM. 02/28/22   Burnard Hawthorne, FNP  ezetimibe (ZETIA) 10 MG tablet Take 1 tablet (10 mg total) by mouth daily. 09/11/21   Minna Merritts, MD  FLUoxetine (PROZAC) 40 MG capsule TAKE 1 CAPSULE (40 MG TOTAL) BY MOUTH DAILY. 11/11/21   Burnard Hawthorne, FNP  Multiple Vitamins-Minerals (EQ MULTIVITAMINS ADULT  GUMMY PO) Take by mouth daily.    [provider]     Family History  Problem Relation Age of Onset   Hypertension Mother    Colon cancer Mother 32   Atrial fibrillation Mother    Heart disease Father    Diabetes Father    Colon cancer Father 58   Congestive Heart Failure Father    COPD Sister    Crohn's disease Sister    Colon cancer Sister 75   Heart disease Brother    Heart attack Sister    Breast cancer Neg Hx     Social History   Socioeconomic History   Marital status: Married    Spouse name: Not on file   Number of children: 1   Years of education: Not on file   Highest education level: Not on file  Occupational History    Employer: [employer]  Tobacco Use   Smoking status: Every Day    Packs/day: 1.00    Years: 36.00    Total pack years: 36.00    Types: Cigarettes   Smokeless tobacco: Never   Tobacco comments:    she plans to quit without medication  Vaping Use   Vaping Use: Never used  Substance and Sexual Activity   Alcohol use: Yes    Comment: Daily glass wine x2   Drug use: No   Sexual activity: Not Currently    Birth control/protection: None, Post-menopausal  Other Topics Concern   Not on file  Social History Narrative   Works for [employer], retired 06/2016   Married.   Daughter and granddaughter.          Social Determinants of Health   Financial Resource Strain: Low Risk  (03/13/2022)   Overall Financial Resource Strain (CARDIA)    Difficulty of Paying Living Expenses: Not hard at all  Food Insecurity: No Food Insecurity (03/13/2022)   Hunger Vital Sign    Worried About Running Out of Food in the Last Year: Never true    Ran Out of Food in the Last Year: Never true  Transportation Needs: No Transportation Needs (03/13/2022)   PRAPARE - Hydrologist (Medical): No    Lack of Transportation (Non-Medical): No  Physical Activity: Unknown (10/07/2017)   Exercise Vital Sign    Days of Exercise per Week:  0 days    Minutes of Exercise per Session: Not on file  Stress: No Stress Concern Present (03/13/2022)   [place]    Feeling of Stress : Only a little  Social Connections: Unknown (03/13/2022)   Social Connection and Isolation Panel [NHANES]    Frequency of Communication with Friends and Family: More than three times a week    Frequency of Social Gatherings with Friends and Family: More than three times a week    Attends Religious Services: More than 4 times per year    Active Member of [employer] or Organizations: Not on file    Attends Archivist Meetings: Not on file    Marital Status: Not on file    Review of Systems: A 12 point ROS discussed and pertinent positives are indicated in the HPI above.  All other systems are negative.  Review of Systems  Vital Signs: There were no vitals taken for this visit.   Physical Exam  Imaging: No results found.  Labs:  CBC: Recent Labs    01/08/22 0907 03/04/22 0905  WBC 9.4 5.3  HGB 15.1* 13.9  HCT 44.8 41.2  PLT 313.0 254.0    COAGS: No results for input(s): "INR", "APTT" in the last 8760 hours.  BMP: Recent Labs    01/08/22 0907  NA 136  K 4.1  CL 98  CO2 27  GLUCOSE 119*  BUN 9  CALCIUM 9.9  CREATININE 0.95    LIVER FUNCTION TESTS: Recent Labs    01/08/22 0907 03/04/22 0905 06/17/22 0930  BILITOT 0.9 0.5 0.8  AST 25 32 40  ALT 20 24 32  ALKPHOS 125* 133*  111 101  PROT 7.0 6.9 7.8  ALBUMIN 4.2 4.2 4.1    TUMOR MARKERS: No results for input(s): "AFPTM", "CEA", "CA199", "CHROMGRNA" in the last 8760 hours.  Assessment and Plan: 71 yo female presents to IR today for random liver biopsy at the request of Dr. Marius Ditch.   Risks and benefits of liver biopsy was discussed with the patient and/or patient's family including, but not limited to bleeding, infection, damage to adjacent structures or low yield requiring additional tests.  All  of the questions were answered and there is agreement to proceed.  Consent signed and in chart.   Thank you for this interesting consult.  I greatly enjoyed meeting DIAMONDS LIPPARD and look forward to participating in their care.  A copy of this report was sent to the requesting provider on this date.  Electronically Signed: Tyson Alias, NP  07/07/2022, 2:44 PM   I spent a total of {New INPT:304952001} {New Out-Pt:304952002}  {Established Out-Pt:304952003} in face to face in clinical consultation, greater than 50% of which was counseling/coordinating care for abnormal liver enzymes.

## 2022-07-08 ENCOUNTER — Ambulatory Visit
Admission: RE | Admit: 2022-07-08 | Discharge: 2022-07-08 | Disposition: A | Discharge status: Home / Self Care | Payer: No Typology Code available for payment source | Source: Ambulatory Visit | Attending: Gastroenterology | Admitting: Gastroenterology

## 2022-07-08 ENCOUNTER — Other Ambulatory Visit: Payer: Self-pay

## 2022-07-08 ENCOUNTER — Ambulatory Visit: Payer: No Typology Code available for payment source

## 2022-07-08 DIAGNOSIS — K7581 Nonalcoholic steatohepatitis (NASH): Secondary | ICD-10-CM | POA: Insufficient documentation

## 2022-07-08 DIAGNOSIS — R7989 Other specified abnormal findings of blood chemistry: Secondary | ICD-10-CM | POA: Insufficient documentation

## 2022-07-08 DIAGNOSIS — R748 Abnormal levels of other serum enzymes: Secondary | ICD-10-CM | POA: Insufficient documentation

## 2022-07-08 DIAGNOSIS — R945 Abnormal results of liver function studies: Secondary | ICD-10-CM | POA: Diagnosis not present

## 2022-07-08 LAB — CBC
HCT: 41.4 % (ref 36.0–46.0)
Hemoglobin: 13.9 g/dL (ref 12.0–15.0)
MCH: 32.9 pg (ref 26.0–34.0)
MCHC: 33.6 g/dL (ref 30.0–36.0)
MCV: 98.1 fL (ref 80.0–100.0)
Platelets: 323 10*3/uL (ref 150–400)
RBC: 4.22 MIL/uL (ref 3.87–5.11)
RDW: 13 % (ref 11.5–15.5)
WBC: 7.4 10*3/uL (ref 4.0–10.5)
nRBC: 0 % (ref 0.0–0.2)

## 2022-07-08 LAB — PROTIME-INR
INR: 0.9 (ref 0.8–1.2)
Prothrombin Time: 12 seconds (ref 11.4–15.2)

## 2022-07-08 MED ORDER — FENTANYL CITRATE (PF) 100 MCG/2ML IJ SOLN
INTRAMUSCULAR | Status: AC | PRN
  Administered 2022-07-08: 50 ug via INTRAVENOUS

## 2022-07-08 MED ORDER — SODIUM CHLORIDE 0.9 % IV SOLN: INTRAVENOUS | Status: DC

## 2022-07-08 MED ORDER — MIDAZOLAM HCL 2 MG/2ML IJ SOLN
INTRAMUSCULAR | Status: AC | PRN
  Administered 2022-07-08: 1 mg via INTRAVENOUS

## 2022-07-08 MED ORDER — FENTANYL CITRATE (PF) 100 MCG/2ML IJ SOLN
INTRAMUSCULAR | Status: AC
  Filled 2022-07-08: qty 2

## 2022-07-08 MED ORDER — MIDAZOLAM HCL 2 MG/2ML IJ SOLN
INTRAMUSCULAR | Status: AC
  Filled 2022-07-08: qty 4

## 2022-07-08 NOTE — Procedures (Signed)
Interventional Radiology Procedure Note  Procedure: US guided biopsy of liver for medical liver disease Complications: None EBL: None Recommendations: - Bedrest 2 hours.   - Routine wound care - Follow up pathology - Advance diet   Signed,  Corrie Mckusick, DO

## 2022-07-08 NOTE — Progress Notes (Signed)
Family at bedside, tv on

## 2022-07-09 LAB — SURGICAL PATHOLOGY

## 2022-07-11 ENCOUNTER — Ambulatory Visit: Payer: No Typology Code available for payment source | Admitting: Family

## 2022-07-24 DIAGNOSIS — H43813 Vitreous degeneration, bilateral: Secondary | ICD-10-CM | POA: Diagnosis not present

## 2022-07-25 ENCOUNTER — Telehealth: Payer: Self-pay | Admitting: Family

## 2022-07-25 NOTE — Telephone Encounter (Signed)
Dr Marius Ditch Do you mind reaching out to pt regarding liver biopsy result per below?   Jenate, advise pt I have shared with Dr Marius Ditch

## 2022-07-25 NOTE — Telephone Encounter (Signed)
Pt called wanting to know her biopsy results from 9/26

## 2022-07-28 ENCOUNTER — Encounter: Payer: Self-pay | Admitting: Family

## 2022-07-28 DIAGNOSIS — K760 Fatty (change of) liver, not elsewhere classified: Secondary | ICD-10-CM | POA: Insufficient documentation

## 2022-07-28 NOTE — Telephone Encounter (Signed)
Pt called and advised that Dr. Verlin Grills office should be reaching out to her in regards to biopsy. Also follow-up scheduled with Joycelyn Schmid.

## 2022-07-28 NOTE — Telephone Encounter (Signed)
Patient verbalized understanding of results. Patient will recheck labs in 6 months and did a reminder

## 2022-07-28 NOTE — Telephone Encounter (Signed)
Not sure how the liver biopsy results fell through.  Wanda Clayton  Please inform patient that the pathology results from her liver biopsy confirms that she has fatty liver disease only.  There is no evidence of autoimmune hepatitis.  The autoimmune antibodies that were elevated, sometimes could be seen in fatty liver disease as well. She should continue to follow healthy diet, cut back on alcohol use as discussed. I see that her LFTs from September came back normal which is great Recheck LFTs in 6 months  [name]

## 2022-08-20 ENCOUNTER — Encounter: Payer: Self-pay | Admitting: Family

## 2022-08-20 ENCOUNTER — Ambulatory Visit (INDEPENDENT_AMBULATORY_CARE_PROVIDER_SITE_OTHER): Payer: No Typology Code available for payment source | Admitting: Family

## 2022-08-20 VITALS — BP 132/80 | HR 105 | Temp 98.4°F | Ht 65.0 in | Wt 141.6 lb

## 2022-08-20 DIAGNOSIS — I10 Essential (primary) hypertension: Secondary | ICD-10-CM

## 2022-08-20 DIAGNOSIS — M8580 Other specified disorders of bone density and structure, unspecified site: Secondary | ICD-10-CM

## 2022-08-20 DIAGNOSIS — I700 Atherosclerosis of aorta: Secondary | ICD-10-CM

## 2022-08-20 DIAGNOSIS — Z1231 Encounter for screening mammogram for malignant neoplasm of breast: Secondary | ICD-10-CM | POA: Diagnosis not present

## 2022-08-20 NOTE — Progress Notes (Signed)
Subjective:    [name], female    DOB: Oct 11, 1951, 71 y.o.   MRN: [id number]  CC: Wanda Clayton is a 71 y.o. female who presents today for follow up.   HPI: Feels well today.  She weaned herself off of Prozac as she does not think she requires any longer. She is compliant with Zetia and Lipitor.  She is a smoker    Hypertension-compliant with amlodipine 7.'5mg'$   Consult with gastroenterology, Dr. Marius Ditch 06/11/2022  elevated ALT, alkaline phosphatase. Elevated F-Actin IgG and Dr Marius Ditch recommended liver biopsy   HISTORY:  Past Medical History:  Diagnosis Date   Acute pain of left foot 02/08/2020   Anxiety    Depression    Elevated liver enzymes 11/28/2020   Hypertension    Past Surgical History:  Procedure Laterality Date   carpal tunnel repair     COLONOSCOPY WITH PROPOFOL N/A 08/19/2021   Procedure: COLONOSCOPY WITH PROPOFOL;  Surgeon: Lin Landsman, MD;  Location: [facility] ENDOSCOPY;  Service: Gastroenterology;  Laterality: N/A;   DENTAL SURGERY     TUBAL LIGATION     VAGINAL DELIVERY     x1   Family History  Problem Relation Age of Onset   Hypertension Mother    Colon cancer Mother 58   Atrial fibrillation Mother    Heart disease Father    Diabetes Father    Colon cancer Father 74   Congestive Heart Failure Father    COPD Sister    Crohn's disease Sister    Colon cancer Sister 70   Heart disease Brother    Heart attack Sister    Breast cancer Neg Hx     Allergies: Pollen extract Current Outpatient Medications on File Prior to Visit  Medication Sig Dispense Refill   amLODipine (NORVASC) 2.5 MG tablet Take 1 tablet (2.5 mg total) by mouth daily. 90 tablet 1   amLODipine (NORVASC) 5 MG tablet TAKE 1 AND 1/2 TABLETS BY MOUTH DAILY 135 tablet 1   aspirin 81 MG tablet Take 81 mg by mouth daily.     atorvastatin (LIPITOR) 40 MG tablet TAKE 1 TABLET BY MOUTH DAILY AT 6 PM. 90 tablet 3   Multiple Vitamins-Minerals (EQ MULTIVITAMINS ADULT GUMMY  PO) Take by mouth daily.     ezetimibe (ZETIA) 10 MG tablet Take 1 tablet (10 mg total) by mouth daily. (Patient not taking: Reported on 08/20/2022) 90 tablet 3   No current facility-administered medications on file prior to visit.    Social History   Tobacco Use   Smoking status: Every Day    Packs/day: 1.00    Years: 36.00    Total pack years: 36.00    Types: Cigarettes   Smokeless tobacco: Never   Tobacco comments:    she plans to quit without medication  Vaping Use   Vaping Use: Never used  Substance Use Topics   Alcohol use: Yes    Comment: Daily glass wine x2   Drug use: No    Review of Systems  Constitutional:  Negative for chills and fever.  Respiratory:  Negative for cough.   Cardiovascular:  Negative for chest pain and palpitations.  Gastrointestinal:  Negative for nausea and vomiting.      Objective:    BP 132/80 (BP Location: Left Arm, Patient Position: Sitting, Cuff Size: Normal)   Pulse (!) 105   Temp 98.4 F (36.9 C) (Oral)   Ht '5\' 5"'$  (1.651 m)   Wt 141 lb  9.6 oz (64.2 kg)   SpO2 96%   BMI 23.56 kg/m  BP Readings from Last 3 Encounters:  08/20/22 132/80  07/08/22 (!) 159/112  06/11/22 (!) 152/74   Wt Readings from Last 3 Encounters:  08/20/22 141 lb 9.6 oz (64.2 kg)  07/08/22 139 lb (63 kg)  06/11/22 140 lb (63.5 kg)    Physical Exam Vitals reviewed.  Constitutional:      Appearance: She is well-developed.  Eyes:     Conjunctiva/sclera: Conjunctivae normal.  Cardiovascular:     Rate and Rhythm: Normal rate and regular rhythm.     Pulses: Normal pulses.     Heart sounds: Normal heart sounds.  Pulmonary:     Effort: Pulmonary effort is normal.     Breath sounds: Normal breath sounds. No wheezing, rhonchi or rales.  Skin:    General: Skin is warm and dry.  Neurological:     Mental Status: She is alert.  Psychiatric:        Speech: Speech normal.        Behavior: Behavior normal.        Thought Content: Thought content normal.         Assessment & Plan:   Problem List Items Addressed This Visit       Cardiovascular and Mediastinum   Atherosclerosis of aorta (HCC) - Primary    Excellent control.  Continue Zetia 10 mg, atorvastatin 40 mg  LDL 56 The 10-year ASCVD risk score (Cyprian Gongaware DK, et al., 2019) is: 20.8%       HTN (hypertension)    Chronic, stable.  Continue amlodipine 7.5 mg      Other Visit Diagnoses     Encounter for screening mammogram for malignant neoplasm of breast       Relevant Orders   MM 3D SCREEN BREAST BILATERAL   Osteopenia, unspecified location       Relevant Orders   DG Bone Density        I have discontinued Sabrinia W. Sorce's FLUoxetine. I am also having her maintain her aspirin, Multiple Vitamins-Minerals (EQ MULTIVITAMINS ADULT GUMMY PO), ezetimibe, amLODipine, atorvastatin, and amLODipine.   No orders of the defined types were placed in this encounter.   Return precautions given.   Risks, benefits, and alternatives of the medications and treatment plan prescribed today were discussed, and patient expressed understanding.   Education regarding symptom management and diagnosis given to patient on AVS.  Continue to follow with Burnard Hawthorne, FNP for routine health maintenance.   Wanda Clayton and I agreed with plan.   [name], FNP

## 2022-08-20 NOTE — Assessment & Plan Note (Addendum)
Excellent control.  Continue Zetia 10 mg, atorvastatin 40 mg  LDL 56 The 10-year ASCVD risk score (Mitsuye Schrodt DK, et al., 2019) is: 20.8%

## 2022-08-20 NOTE — Assessment & Plan Note (Addendum)
Chronic, stable.  Continue amlodipine 7.5 mg

## 2022-08-20 NOTE — Patient Instructions (Signed)
Please let me know if vaginal bleeding persists   please call  and schedule your 3D mammogram and /or bone density scan as we discussed.   [facility]  ( new location in 2023)  [address], [place], [place] [address]  [place], [place]  [phone]   I have ordered transvaginal ultrasound.  Let us know if you dont hear back within a week in regards to an appointment being scheduled.   So that you are aware, if you are [facility] MyChart user , please pay attention to your MyChart messages as you may receive a MyChart message with a phone number to call and schedule this test/appointment own your own from our referral coordinator. This is a new process so I do not want you to miss this message.  If you are not a MyChart user, you will receive a phone call.   

## 2022-08-27 DIAGNOSIS — X32XXXA Exposure to sunlight, initial encounter: Secondary | ICD-10-CM | POA: Diagnosis not present

## 2022-08-27 DIAGNOSIS — R208 Other disturbances of skin sensation: Secondary | ICD-10-CM | POA: Diagnosis not present

## 2022-08-27 DIAGNOSIS — L720 Epidermal cyst: Secondary | ICD-10-CM | POA: Diagnosis not present

## 2022-08-27 DIAGNOSIS — L578 Other skin changes due to chronic exposure to nonionizing radiation: Secondary | ICD-10-CM | POA: Diagnosis not present

## 2022-08-27 DIAGNOSIS — L820 Inflamed seborrheic keratosis: Secondary | ICD-10-CM | POA: Diagnosis not present

## 2022-10-23 DIAGNOSIS — H25813 Combined forms of age-related cataract, bilateral: Secondary | ICD-10-CM | POA: Diagnosis not present

## 2022-10-24 ENCOUNTER — Other Ambulatory Visit: Payer: Self-pay | Admitting: Family

## 2022-10-24 DIAGNOSIS — I10 Essential (primary) hypertension: Secondary | ICD-10-CM

## 2022-10-27 NOTE — Progress Notes (Signed)
Cardiology Office Note  Date:  10/28/2022   ID:  Wanda Clayton [birth year], MRN [id number]  PCP:  Burnard Hawthorne, FNP   Chief Complaint  Patient presents with   12 month follow up     "Doing well." Medications reviewed by the patient verbally.     HPI:  [name] is a 72 yo woman with past medical history of Smoker, avg 15 a day atherosclerosis seen on CT chest  Hyperlipidemia Hypertension on Depression/anxiety Moderate centrilobular and paraseptal emphysematous change Presents for follow-up of her aortic atherosclerosis and hypertension  Last seen by myself in clinic November 2022 In follow-up today reports that she is doing relatively well Still smoking 15 a day Helps take care of her Mother with dementia, lives at SNF, low weight in her 90s  Active, no regular exercise program Denies chest pain concerning for angina Gets annual CT scan chest to evaluate lungs Reports her husband with smoking after his heart attack  Tolerating Lipitor and Zetia, no significant side effects Cholesterol numbers at goal  Prior CT scan reviewed: moderate coronary ca and mild in aorta  EKG personally reviewed by myself on todays visit Shows normal sinus rhythm with rate 98 bpm no significant ST or T-wave changes  Other past medical history reviewed  episode in December 2018 room was spinning.  Occurred when laying on back and rolled over to husband.   Chronic tinnitus, hearing loss ( wears hearing aids).   CT scan 2019 Mild to moderate aortic atherosclerosis in the arch, carotids Also noted in the coronary arteries, proximal and mid LAD, And proximal RCA   PMH:   has a past medical history of Acute pain of left foot (02/08/2020), Anxiety, Depression, Elevated liver enzymes (11/28/2020), and Hypertension.  PSH:    Past Surgical History:  Procedure Laterality Date   carpal tunnel repair     COLONOSCOPY WITH PROPOFOL N/A 08/19/2021   Procedure: COLONOSCOPY WITH  PROPOFOL;  Surgeon: Lin Landsman, MD;  Location: [facility] ENDOSCOPY;  Service: Gastroenterology;  Laterality: N/A;   DENTAL SURGERY     TUBAL LIGATION     VAGINAL DELIVERY     x1    Current Outpatient Medications  Medication Sig Dispense Refill   amLODipine (NORVASC) 2.5 MG tablet Take 1 tablet (2.5 mg total) by mouth daily. 90 tablet 1   amLODipine (NORVASC) 5 MG tablet TAKE 1 AND 1/2 TABLETS DAILY BY MOUTH 135 tablet 1   aspirin 81 MG tablet Take 81 mg by mouth daily.     atorvastatin (LIPITOR) 40 MG tablet TAKE 1 TABLET BY MOUTH DAILY AT 6 PM. 90 tablet 3   ezetimibe (ZETIA) 10 MG tablet Take 1 tablet (10 mg total) by mouth daily. 90 tablet 3   Multiple Vitamins-Minerals (EQ MULTIVITAMINS ADULT GUMMY PO) Take by mouth daily.     No current facility-administered medications for this visit.    Allergies:   Pollen extract   Social History:  The patient  reports that she has been smoking cigarettes. She has a 18.00 pack-year smoking history. She has never used smokeless tobacco. She reports current alcohol use. She reports that she does not use drugs.   Family History:   family history includes Atrial fibrillation in her mother; COPD in her sister; Colon cancer (age of onset: 83) in her sister; Colon cancer (age of onset: 2) in her father; Colon cancer (age of onset: 42) in her mother; Congestive Heart Failure in her father; Crohn's disease  in her sister; Diabetes in her father; Heart attack in her sister; Heart disease in her brother and father; Hypertension in her mother.    Review of Systems: Review of Systems  Constitutional: Negative.   HENT: Negative.    Respiratory: Negative.    Cardiovascular: Negative.   Gastrointestinal: Negative.   Musculoskeletal: Negative.   Neurological: Negative.   Psychiatric/Behavioral: Negative.    All other systems reviewed and are negative.   PHYSICAL EXAM: VS:  BP 130/60 (BP Location: Right Arm, Patient Position: Sitting, Cuff Size:  Normal)   Pulse 98   Ht '5\' 5"'$  (1.651 m)   Wt 139 lb 2 oz (63.1 kg)   SpO2 97%   BMI 23.15 kg/m  , BMI Body mass index is 23.15 kg/m. Constitutional:  oriented to person, place, and time. No distress.  HENT:  Head: Grossly normal Eyes:  no discharge. No scleral icterus.  Neck: No JVD, no carotid bruits  Cardiovascular: Regular rate and rhythm, no murmurs appreciated Pulmonary/Chest: Clear to auscultation bilaterally, no wheezes or rails Abdominal: Soft.  no distension.  no tenderness.  Musculoskeletal: Normal range of motion Neurological:  normal muscle tone. Coordination normal. No atrophy Skin: Skin warm and dry Psychiatric: normal affect, pleasant  Recent Labs: 01/08/2022: BUN 9; Creatinine, Ser 0.95; Potassium 4.1; Sodium 136; TSH 3.88 06/17/2022: ALT 32 07/08/2022: Hemoglobin 13.9; Platelets 323    Lipid Panel Lab Results  Component Value Date   CHOL 192 01/08/2022   HDL 97.70 01/08/2022   LDLCALC 56 01/08/2022   TRIG 192.0 (H) 01/08/2022      Wt Readings from Last 3 Encounters:  10/28/22 139 lb 2 oz (63.1 kg)  08/20/22 141 lb 9.6 oz (64.2 kg)  07/08/22 139 lb (63 kg)     ASSESSMENT AND PLAN:  Atherosclerosis of aorta (HCC) - Plan: EKG 12-Lead Mild to moderate diffuse disease in her aorta on CT scan Smoking cessation recommended  on her statin with Zetia, LDL at goal  Essential hypertension - Blood pressure is well controlled on today's visit. No changes made to the medications.  Mixed hyperlipidemia Cholesterol is at goal on the current lipid regimen. No changes to the medications were made.  Carotid stenosis Mild disease bilaterally on ultrasound March 2021 Smoking cessation recommended  Smoker We have encouraged her to continue to work on weaning her cigarettes and smoking cessation. She will continue to work on this and does not want any assistance with chantix.    Coronary artery calcification seen on CT scan On cholesterol medication, LDL at goal,   smoking cessation recommended    Total encounter time more than 30 minutes  Greater than 50% was spent in counseling and coordination of care with the patient   No orders of the defined types were placed in this encounter.    Signed, Esmond Plants, M.D., Ph.D. 10/28/2022  [place], [place]

## 2022-10-28 ENCOUNTER — Encounter: Payer: Self-pay | Admitting: Cardiovascular Disease

## 2022-10-28 ENCOUNTER — Ambulatory Visit
Payer: No Typology Code available for payment source | Attending: Cardiovascular Disease | Admitting: Cardiovascular Disease

## 2022-10-28 VITALS — BP 130/60 | HR 98 | Ht 65.0 in | Wt 139.1 lb

## 2022-10-28 DIAGNOSIS — I700 Atherosclerosis of aorta: Secondary | ICD-10-CM | POA: Diagnosis not present

## 2022-10-28 DIAGNOSIS — I6523 Occlusion and stenosis of bilateral carotid arteries: Secondary | ICD-10-CM | POA: Diagnosis not present

## 2022-10-28 DIAGNOSIS — E782 Mixed hyperlipidemia: Secondary | ICD-10-CM

## 2022-10-28 DIAGNOSIS — I2510 Atherosclerotic heart disease of native coronary artery without angina pectoris: Secondary | ICD-10-CM

## 2022-10-28 DIAGNOSIS — I739 Peripheral vascular disease, unspecified: Secondary | ICD-10-CM | POA: Diagnosis not present

## 2022-10-28 DIAGNOSIS — I10 Essential (primary) hypertension: Secondary | ICD-10-CM | POA: Diagnosis not present

## 2022-10-28 DIAGNOSIS — F17200 Nicotine dependence, unspecified, uncomplicated: Secondary | ICD-10-CM

## 2022-10-28 MED ORDER — EZETIMIBE 10 MG PO TABS: 10.0000 mg | ORAL_TABLET | Freq: Every day | ORAL | 3 refills | Status: DC

## 2022-10-28 NOTE — Patient Instructions (Signed)
Medication Instructions:  No changes  If you need a refill on your cardiac medications before your next appointment, please call your pharmacy.   Lab work: No new labs needed  Testing/Procedures: No new testing needed  Follow-Up: At [facility], you and your health needs are our priority.  As part of our continuing mission to provide you with exceptional heart care, we have created designated Provider Care Teams.  These Care Teams include your primary Cardiologist (physician) and Advanced Practice Providers (APPs -  Physician Assistants and Nurse Practitioners) who all work together to provide you with the care you need, when you need it.  You will need a follow up appointment in 12 months  Providers on your designated Care Team:   Christopher Berge, NP Ryan Dunn, PA-C Cadence Furth, PA-C  COVID-19 Vaccine Information can be found at: [email or web address] For questions related to vaccine distribution or appointments, please email [email or web address] or call [phone].   

## 2022-11-05 ENCOUNTER — Ambulatory Visit
Admission: RE | Admit: 2022-11-05 | Discharge: 2022-11-05 | Disposition: A | Discharge status: Home / Self Care | Payer: No Typology Code available for payment source | Source: Ambulatory Visit | Attending: Family | Admitting: Family

## 2022-11-05 DIAGNOSIS — M8580 Other specified disorders of bone density and structure, unspecified site: Secondary | ICD-10-CM | POA: Diagnosis present

## 2022-11-05 DIAGNOSIS — Z780 Asymptomatic menopausal state: Secondary | ICD-10-CM | POA: Insufficient documentation

## 2022-11-05 DIAGNOSIS — Z1231 Encounter for screening mammogram for malignant neoplasm of breast: Secondary | ICD-10-CM | POA: Insufficient documentation

## 2022-11-05 DIAGNOSIS — F17200 Nicotine dependence, unspecified, uncomplicated: Secondary | ICD-10-CM | POA: Diagnosis not present

## 2022-11-05 DIAGNOSIS — M8589 Other specified disorders of bone density and structure, multiple sites: Secondary | ICD-10-CM | POA: Insufficient documentation

## 2022-11-05 DIAGNOSIS — Z13820 Encounter for screening for osteoporosis: Secondary | ICD-10-CM | POA: Diagnosis not present

## 2022-11-05 DIAGNOSIS — M85851 Other specified disorders of bone density and structure, right thigh: Secondary | ICD-10-CM | POA: Diagnosis not present

## 2022-11-10 ENCOUNTER — Ambulatory Visit
Admission: RE | Admit: 2022-11-10 | Discharge: 2022-11-10 | Disposition: A | Discharge status: Home / Self Care | Payer: No Typology Code available for payment source | Source: Ambulatory Visit | Attending: Family | Admitting: Family

## 2022-11-10 DIAGNOSIS — Z87891 Personal history of nicotine dependence: Secondary | ICD-10-CM

## 2022-11-10 DIAGNOSIS — F17210 Nicotine dependence, cigarettes, uncomplicated: Secondary | ICD-10-CM | POA: Insufficient documentation

## 2022-11-11 ENCOUNTER — Encounter: Payer: Self-pay | Admitting: Family

## 2022-11-11 ENCOUNTER — Other Ambulatory Visit: Payer: Self-pay | Admitting: Acute Care

## 2022-11-11 ENCOUNTER — Telehealth: Payer: Self-pay | Admitting: Family

## 2022-11-11 DIAGNOSIS — F17210 Nicotine dependence, cigarettes, uncomplicated: Secondary | ICD-10-CM

## 2022-11-11 DIAGNOSIS — Z122 Encounter for screening for malignant neoplasm of respiratory organs: Secondary | ICD-10-CM

## 2022-11-11 DIAGNOSIS — Z87891 Personal history of nicotine dependence: Secondary | ICD-10-CM

## 2022-11-11 NOTE — Telephone Encounter (Signed)
close

## 2022-11-17 ENCOUNTER — Other Ambulatory Visit: Payer: Self-pay

## 2022-11-17 ENCOUNTER — Encounter: Payer: Self-pay | Admitting: Family

## 2022-11-17 ENCOUNTER — Ambulatory Visit: Payer: No Typology Code available for payment source | Admitting: Family

## 2022-11-17 VITALS — BP 132/78 | HR 90 | Temp 97.6°F | Ht 65.0 in | Wt 137.2 lb

## 2022-11-17 DIAGNOSIS — M8580 Other specified disorders of bone density and structure, unspecified site: Secondary | ICD-10-CM | POA: Diagnosis not present

## 2022-11-17 DIAGNOSIS — I700 Atherosclerosis of aorta: Secondary | ICD-10-CM

## 2022-11-17 DIAGNOSIS — Q891 Congenital malformations of adrenal gland: Secondary | ICD-10-CM

## 2022-11-17 DIAGNOSIS — I2510 Atherosclerotic heart disease of native coronary artery without angina pectoris: Secondary | ICD-10-CM

## 2022-11-17 DIAGNOSIS — I10 Essential (primary) hypertension: Secondary | ICD-10-CM

## 2022-11-17 LAB — BASIC METABOLIC PANEL
BUN: 10 mg/dL (ref 6–23)
CO2: 26 mEq/L (ref 19–32)
Calcium: 9.2 mg/dL (ref 8.4–10.5)
Chloride: 101 mEq/L (ref 96–112)
Creatinine, Ser: 0.89 mg/dL (ref 0.40–1.20)
GFR: 65.15 mL/min (ref >60.00)
Glucose, Bld: 89 mg/dL (ref 70–99)
Potassium: 4.4 mEq/L (ref 3.5–5.1)
Sodium: 138 mEq/L (ref 135–145)

## 2022-11-17 LAB — VITAMIN D 25 HYDROXY (VIT D DEFICIENCY, FRACTURES): VITD: 32.31 ng/mL (ref 30.00–100.00)

## 2022-11-17 MED ORDER — EZETIMIBE 10 MG PO TABS
10.0000 mg | ORAL_TABLET | Freq: Every day | ORAL | 3 refills | Status: DC
  Filled 2022-11-17: qty 90, 90d supply, fill #0

## 2022-11-17 NOTE — Assessment & Plan Note (Signed)
Discussed that she meets criteria to consider biphosphonate. She declines treatment at this time.  She would like to optimize calcium and vitamin D.  Advised her in regards to doses of both.  Encouraged more formal exercise.  We will continue to monitor.  Repeat DEXA 2026

## 2022-11-17 NOTE — Assessment & Plan Note (Signed)
Excellent control.  Continue Zetia 10 mg, atorvastatin 40 mg

## 2022-11-17 NOTE — Patient Instructions (Signed)
  For post menopausal women, guidelines recommend a diet with 1200 mg of Calcium per day. If you are eating calcium rich foods, you do not need a calcium supplement. The body better absorbs the calcium that you eat over supplementation. If you do supplement, I recommend not supplementing the full 1200 mg/ day as this can lead to increased risk of cardiovascular disease. I recommend Calcium Citrate over the counter, and you may take a total of 600 to 800 mg per day in divided doses with meals for best absorption.   For bone health, you need adequate vitamin D, and I recommend you supplement as it is harder to do so with diet alone. I recommend cholecalciferol 800 units daily.  Also, please ensure you are following a diet high in calcium -- research shows better outcomes with dietary sources including kale, yogurt, broccolii, cheese, okra, almonds- to name a few.     Also remember that exercise is a great medicine for maintain and preserve bone health. Advise moderate exercise for 30 minutes , 3 times per week.

## 2022-11-17 NOTE — Assessment & Plan Note (Addendum)
Incidental left adrenal gland thickening seen ct chest. Advised of the importance of evaluation of adrenal hyperfunction . She will continue annual CT chest. Referral placed to endocrinology.

## 2022-11-17 NOTE — Progress Notes (Signed)
Assessment & Plan:  Osteopenia determined by x-ray Assessment & Plan: Discussed that she meets criteria to consider biphosphonate. She declines treatment at this time.  She would like to optimize calcium and vitamin D.  Advised her in regards to doses of both.  Encouraged more formal exercise.  We will continue to monitor.  Repeat DEXA 2026  Orders: -     Basic metabolic panel -     VITAMIN D 25 Hydroxy (Vit-D Deficiency, Fractures)  Coronary artery calcification seen on CT scan -     Ezetimibe; Take 1 tablet (10 mg total) by mouth daily.  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 Advised of the importance of evaluation of adrenal hyperfunction . She will continue annual CT chest. Referral placed to endocrinology.   Orders: -     Ambulatory referral to Endocrinology  Primary hypertension Assessment & Plan: Chronic, stable.  Continue amlodipine 7.5 mg qd   Atherosclerosis of aorta (HCC) Assessment & Plan: Excellent control.  Continue Zetia 10 mg, atorvastatin 40 mg         Return precautions given.   Risks, benefits, and alternatives of the medications and treatment plan prescribed today were discussed, and patient expressed understanding.   Education regarding symptom management and diagnosis given to patient on AVS either electronically or printed.  No follow-ups on file.  Wanda Clayton, Wanda Clayton  Subjective:    [name], female    DOB: [birth year], 72 y.o.   MRN: [id number]  CC: Wanda Clayton is a 72 y.o. female who presents today for follow up.   HPI: Here today to discuss recent DEXA and CT chest.    She is not taking calcium or vitamin d.  No formal exercise although she reports she is very busy and does not lead a sedentary lifestyle.  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  CT chest 11/10/22 lung rads , Mild left adrenal thickening and  nodularity, similar.   Compliant with amlodipine 7.5 mg daily  Allergies: Pollen extract Current Outpatient Medications on File Prior to Visit  Medication Sig Dispense Refill   amLODipine (NORVASC) 2.5 MG tablet Take 1 tablet (2.5 mg total) by mouth daily. 90 tablet 1   amLODipine (NORVASC) 5 MG tablet TAKE 1 AND 1/2 TABLETS DAILY BY MOUTH 135 tablet 1   aspirin 81 MG tablet Take 81 mg by mouth daily.     atorvastatin (LIPITOR) 40 MG tablet TAKE 1 TABLET BY MOUTH DAILY AT 6 PM. 90 tablet 3   Multiple Vitamins-Minerals (EQ MULTIVITAMINS ADULT GUMMY PO) Take by mouth daily.     No current facility-administered medications on file prior to visit.    Review of Systems  Constitutional:  Negative for chills and fever.  Respiratory:  Negative for cough.   Cardiovascular:  Negative for chest pain and palpitations.  Gastrointestinal:  Negative for nausea and vomiting.      Objective:    BP 132/78   Pulse 90   Temp 97.6 F (36.4 C) (Oral)   Ht '5\' 5"'$  (1.651 m)   Wt 137 lb 3.2 oz (62.2 kg)   SpO2 98%   BMI 22.83 kg/m  BP Readings from Last 3 Encounters:  11/17/22 132/78  10/28/22 130/60  08/20/22 132/80   Wt Readings from Last 3 Encounters:  11/17/22 137 lb 3.2 oz (62.2 kg)  10/28/22 139 lb 2 oz (63.1  kg)  08/20/22 141 lb 9.6 oz (64.2 kg)    Physical Exam Vitals reviewed.  Constitutional:      Appearance: She is well-developed.  Eyes:     Conjunctiva/sclera: Conjunctivae normal.  Cardiovascular:     Rate and Rhythm: Normal rate and regular rhythm.     Pulses: Normal pulses.     Heart sounds: Normal heart sounds.  Pulmonary:     Effort: Pulmonary effort is normal.     Breath sounds: Normal breath sounds. No wheezing, rhonchi or rales.  Skin:    General: Skin is warm and dry.  Neurological:     Mental Status: She is alert.  Psychiatric:        Speech: Speech normal.        Behavior: Behavior normal.        Thought Content: Thought content normal.

## 2022-11-17 NOTE — Assessment & Plan Note (Signed)
Chronic, stable.  Continue amlodipine 7.5 mg qd

## 2022-11-18 ENCOUNTER — Other Ambulatory Visit: Payer: Self-pay

## 2023-01-27 ENCOUNTER — Telehealth: Payer: Self-pay

## 2023-01-27 DIAGNOSIS — R7989 Other specified abnormal findings of blood chemistry: Secondary | ICD-10-CM

## 2023-01-27 NOTE — Telephone Encounter (Signed)
Patient verbalized understanding. Patient states she can not come today but will come by tomorrow to get the lab work done

## 2023-01-27 NOTE — Telephone Encounter (Signed)
-----   Message from Denman George, CMA sent at 07/28/2022  1:11 PM EDT ----- Recheck 6 month LFT

## 2023-02-19 DIAGNOSIS — R208 Other disturbances of skin sensation: Secondary | ICD-10-CM | POA: Diagnosis not present

## 2023-02-19 DIAGNOSIS — L728 Other follicular cysts of the skin and subcutaneous tissue: Secondary | ICD-10-CM | POA: Diagnosis not present

## 2023-02-19 DIAGNOSIS — L820 Inflamed seborrheic keratosis: Secondary | ICD-10-CM | POA: Diagnosis not present

## 2023-02-19 DIAGNOSIS — L538 Other specified erythematous conditions: Secondary | ICD-10-CM | POA: Diagnosis not present

## 2023-02-19 DIAGNOSIS — L658 Other specified nonscarring hair loss: Secondary | ICD-10-CM | POA: Diagnosis not present

## 2023-02-24 DIAGNOSIS — R7989 Other specified abnormal findings of blood chemistry: Secondary | ICD-10-CM | POA: Diagnosis not present

## 2023-02-25 LAB — HEPATIC FUNCTION PANEL
ALT: 31 IU/L (ref 0–32)
AST: 40 IU/L (ref 0–40)
Albumin: 4.4 g/dL (ref 3.8–4.8)
Alkaline Phosphatase: 111 IU/L (ref 44–121)
Bilirubin Total: 0.5 mg/dL (ref 0.0–1.2)
Bilirubin, Direct: 0.21 mg/dL (ref 0.00–0.40)
Total Protein: 7.1 g/dL (ref 6.0–8.5)

## 2023-03-18 DIAGNOSIS — H02403 Unspecified ptosis of bilateral eyelids: Secondary | ICD-10-CM | POA: Diagnosis not present

## 2023-03-24 ENCOUNTER — Telehealth: Payer: Self-pay | Admitting: Family

## 2023-03-24 ENCOUNTER — Ambulatory Visit (INDEPENDENT_AMBULATORY_CARE_PROVIDER_SITE_OTHER): Payer: No Typology Code available for payment source

## 2023-03-24 VITALS — BP 88/56 | Ht 65.0 in | Wt 131.0 lb

## 2023-03-24 DIAGNOSIS — Z0000 Encounter for general adult medical examination without abnormal findings: Secondary | ICD-10-CM

## 2023-03-24 NOTE — Telephone Encounter (Signed)
Patient called and her bp is running low, 78/61 , 84/55. Front office tried to schedule her to come in and see Arnett on 03/25/2023. She just wants to know if she should stop her  amLODipine (NORVASC) 5 MG tablet

## 2023-03-24 NOTE — Progress Notes (Signed)
I connected with  Wanda Clayton on 03/24/23 by a audio enabled telemedicine application and verified that I am speaking with the correct person using two identifiers.  Patient Location: Home  Provider Location: Office/Clinic  I discussed the limitations of evaluation and management by telemedicine. The patient expressed understanding and agreed to proceed.  Subjective:   Wanda Clayton is a 72 y.o. female who presents for Medicare Annual (Subsequent) preventive examination.  Review of Systems     Cardiac Risk Factors include: advanced age (>12men, >32 women);hypertension;dyslipidemia     Objective:    Today's Vitals   03/24/23 0835 03/24/23 0853  BP:  (!) 88/56  Weight:  131 lb (59.4 kg)  Height:  5\' 5"  (1.651 m)  PainSc: 5     Body mass index is 21.8 kg/m.     03/24/2023    8:43 AM 03/13/2022    4:12 PM 08/19/2021    9:32 AM 03/12/2021   10:41 AM 02/28/2020   10:46 AM 10/01/2019    7:31 AM 10/11/2018   11:04 AM  Advanced Directives  Does Patient Have a Medical Advance Directive? No Yes Yes Yes Yes No Yes  Type of Special educational needs teacher of [place];Living will Healthcare Power of [place];Living will Healthcare Power of [employer] of [place];Living will  Living will;Healthcare Power of Attorney  Does patient want to make changes to medical advance directive?  No - Patient declined  No - Patient declined No - Patient declined  No - Patient declined  Copy of Healthcare Power of Attorney in Chart?  No - copy requested  No - copy requested No - copy requested  No - copy requested  Would patient like information on creating a medical advance directive? No - Patient declined          Current Medications (verified) Outpatient Encounter Medications as of 03/24/2023  Medication Sig   aspirin 81 MG tablet Take 81 mg by mouth daily.   atorvastatin (LIPITOR) 40 MG tablet TAKE 1 TABLET BY MOUTH DAILY AT 6 PM.   ezetimibe (ZETIA) 10 MG tablet Take 1 tablet  (10 mg total) by mouth daily.   Multiple Vitamins-Minerals (EQ MULTIVITAMINS ADULT GUMMY PO) Take by mouth daily.   amLODipine (NORVASC) 2.5 MG tablet Take 1 tablet (2.5 mg total) by mouth daily. (Patient not taking: Reported on 03/24/2023)   amLODipine (NORVASC) 5 MG tablet TAKE 1 AND 1/2 TABLETS DAILY BY MOUTH (Patient not taking: Reported on 03/24/2023)   No facility-administered encounter medications on file as of 03/24/2023.    Allergies (verified) Pollen extract   History: Past Medical History:  Diagnosis Date   Acute pain of left foot 02/08/2020   Anxiety    Depression    Elevated liver enzymes 11/28/2020   Hypertension    Past Surgical History:  Procedure Laterality Date   carpal tunnel repair     COLONOSCOPY WITH PROPOFOL N/A 08/19/2021   Procedure: COLONOSCOPY WITH PROPOFOL;  Surgeon: Toney Reil, MD;  Location[facility] ENDOSCOPY;  Service: Gastroenterology;  Laterality: N/A;   DENTAL SURGERY     TUBAL LIGATION     VAGINAL DELIVERY     x1   Family History  Problem Relation Age of Onset   Hypertension Mother    Colon cancer Mother 40   Atrial fibrillation Mother    Heart disease Father    Diabetes Father    Colon cancer Father 62   Congestive Heart Failure Father    COPD  Sister    Crohn's disease Sister    Colon cancer Sister 7   Heart disease Brother    Heart attack Sister    Breast cancer Neg Hx    Social History   Socioeconomic History   Marital status: Married    Spouse name: Not on file   Number of children: 1   Years of education: Not on file   Highest education level: Not on file  Occupational History    Employer: [employer]  Tobacco Use   Smoking status: Every Day    Packs/day: 0.50    Years: 36.00    Additional pack years: 0.00    Total pack years: 18.00    Types: Cigarettes   Smokeless tobacco: Never   Tobacco comments:    she plans to quit without medication  Vaping Use   Vaping Use: Never used  Substance and Sexual  Activity   Alcohol use: Yes    Comment: Daily glass wine x2   Drug use: No   Sexual activity: Not Currently    Birth control/protection: None, Post-menopausal  Other Topics Concern   Not on file  Social History Narrative   Works for [employer], retired 06/2016   Married.   Daughter and granddaughter.          Social Determinants of Health   Financial Resource Strain: Low Risk  (03/24/2023)   Overall Financial Resource Strain (CARDIA)    Difficulty of Paying Living Expenses: Not hard at all  Food Insecurity: No Food Insecurity (03/24/2023)   Hunger Vital Sign    Worried About Running Out of Food in the Last Year: Never true    Ran Out of Food in the Last Year: Never true  Transportation Needs: No Transportation Needs (03/24/2023)   PRAPARE - Administrator, Civil Service (Medical): No    Lack of Transportation (Non-Medical): No  Physical Activity: Insufficiently Active (03/24/2023)   Exercise Vital Sign    Days of Exercise per Week: 2 days    Minutes of Exercise per Session: 20 min  Stress: No Stress Concern Present (03/24/2023)   [employer] of Occupational Health - Occupational Stress Questionnaire    Feeling of Stress : Only a little  Social Connections: Moderately Integrated (03/24/2023)   Social Connection and Isolation Panel [NHANES]    Frequency of Communication with Friends and Family: More than three times a week    Frequency of Social Gatherings with Friends and Family: Not on file    Attends Religious Services: More than 4 times per year    Active Member of [employer] or Organizations: No    Attends Engineer, structural: Never    Marital Status: Married    Tobacco Counseling Ready to quit: Not Answered Counseling given: Not Answered Tobacco comments: she plans to quit without medication   Clinical Intake:  Pre-visit preparation completed: Yes  Pain : 0-10 Pain Score: 5  Pain Type: Chronic pain Pain Radiating Towards: hurts all over      Nutritional Risks: None Diabetes: No  How often do you need to have someone help you when you read instructions, pamphlets, or other written materials from your doctor or pharmacy?: 1 - Never  Diabetic?no  Interpreter Needed?: No  Information entered by :: Kennedy Bucker, LPN   Activities of Daily Living    03/24/2023    8:43 AM 07/08/2022    9:02 AM  In your present state of health, do you have any difficulty  performing the following activities:  Hearing? 0 0  Vision? 0 0  Difficulty concentrating or making decisions? 0 0  Walking or climbing stairs? 1 0  Dressing or bathing? 0 0  Doing errands, shopping? 0   Preparing Food and eating ? N   Using the Toilet? N   In the past six months, have you accidently leaked urine? N   Do you have problems with loss of bowel control? N   Managing your Medications? N   Managing your Finances? N   Housekeeping or managing your Housekeeping? N     Patient Care Team: Allegra Grana, FNP as PCP - General (Family Medicine)  Indicate any recent Medical Services you may have received from other than [facility] providers in the past year (date may be approximate).     Assessment:   This is a routine wellness examination for Zaira.  Hearing/Vision screen Hearing Screening - Comments:: Wears aids Vision Screening - Comments:: Wears glasses- Dr.Bell  Dietary issues and exercise activities discussed: Current Exercise Habits: Home exercise routine, Type of exercise: walking, Time (Minutes): 20, Frequency (Times/Week): 2, Weekly Exercise (Minutes/Week): 40, Intensity: Mild   Goals Addressed             This Visit's Progress    DIET - EAT MORE FRUITS AND VEGETABLES         Depression Screen    03/24/2023    8:40 AM 11/17/2022   10:28 AM 08/20/2022    1:26 PM 03/13/2022    3:58 PM 01/08/2022    8:34 AM 09/09/2021   10:34 AM 03/12/2021   10:40 AM  PHQ 2/9 Scores  PHQ - 2 Score 0 0 0 0 0 0 0  PHQ- 9 Score 0    0 0     Fall Risk     03/24/2023    8:43 AM 11/17/2022   10:28 AM 08/20/2022    1:26 PM 03/13/2022    4:01 PM 01/08/2022    8:33 AM  Fall Risk   Falls in the past year? 0 0 0 1 1  Number falls in past yr: 0 0 0 0 1  Injury with Fall? 0 0 0 0 1  Risk for fall due to : No Fall Risks No Fall Risks No Fall Risks  History of fall(s)  Follow up Falls prevention discussed;Falls evaluation completed Falls evaluation completed Falls evaluation completed Falls evaluation completed Falls evaluation completed    FALL RISK PREVENTION PERTAINING TO THE HOME:  Any stairs in or around the home? No  If so, are there any without handrails? No  Home free of loose throw rugs in walkways, pet beds, electrical cords, etc? Yes  Adequate lighting in your home to reduce risk of falls? Yes   ASSISTIVE DEVICES UTILIZED TO PREVENT FALLS:  Life alert? No  Use of a cane, walker or w/c? No  Grab bars in the bathroom? Yes  Shower chair or bench in shower? No  Elevated toilet seat or a handicapped toilet? No    Cognitive Function:    10/07/2017    8:49 AM  MMSE - Mini Mental State Exam  Orientation to time 5  Orientation to Place 5  Registration 3  Attention/ Calculation 5  Recall 3  Language- name 2 objects 2  Language- repeat 1  Language- follow 3 step command 3  Language- read & follow direction 1  Write a sentence 1  Copy design 1  Total score 30  03/24/2023    8:47 AM 02/28/2020   10:58 AM 10/11/2018   11:36 AM  6CIT Screen  What Year? 0 points 0 points 0 points  What month? 0 points 0 points 0 points  What time? 0 points 0 points 0 points  Count back from 20 0 points  0 points  Months in reverse 0 points  0 points  Repeat phrase 0 points  0 points  Total Score 0 points  0 points    Immunizations Immunization History  Administered Date(s) Administered   PFIZER(Purple Top)SARS-COV-2 Vaccination 01/06/2020, 01/27/2020   Td 02/13/2016    TDAP status: Up to date  Flu Vaccine status: Declined,  Education has been provided regarding the importance of this vaccine but patient still declined. Advised may receive this vaccine at local pharmacy or Health Dept. Aware to provide a copy of the vaccination record if obtained from local pharmacy or Health Dept. Verbalized acceptance and understanding.  Pneumococcal vaccine status: Declined,  Education has been provided regarding the importance of this vaccine but patient still declined. Advised may receive this vaccine at local pharmacy or Health Dept. Aware to provide a copy of the vaccination record if obtained from local pharmacy or Health Dept. Verbalized acceptance and understanding.   Covid-19 vaccine status: Completed vaccines  Qualifies for Shingles Vaccine? Yes   Zostavax completed No   Shingrix Completed?: No.    Education has been provided regarding the importance of this vaccine. Patient has been advised to call insurance company to determine out of pocket expense if they have not yet received this vaccine. Advised may also receive vaccine at local pharmacy or Health Dept. Verbalized acceptance and understanding.  Screening Tests Health Maintenance  Topic Date Due   Zoster Vaccines- Shingrix (1 of 2) Never done   COVID-19 Vaccine (3 - 2023-24 season) 06/13/2022   INFLUENZA VACCINE  05/14/2023   MAMMOGRAM  11/06/2023   Medicare Annual Wellness (AWV)  03/23/2024   DTaP/Tdap/Td (2 - Tdap) 02/12/2026   Colonoscopy  08/19/2026   DEXA SCAN  Completed   Hepatitis C Screening  Completed   HPV VACCINES  Aged Out   Lung Cancer Screening  Discontinued   Pneumonia Vaccine 21+ Years old  Discontinued    Health Maintenance  Health Maintenance Due  Topic Date Due   Zoster Vaccines- Shingrix (1 of 2) Never done   COVID-19 Vaccine (3 - 2023-24 season) 06/13/2022    Colorectal cancer screening: Type of screening: Colonoscopy. Completed 08/19/21. Repeat every 5 years  Mammogram status: Completed 11/05/22. Repeat every year  Bone Density  status: Completed 11/05/22. Results reflect: Bone density results: OSTEOPENIA. Repeat every 5 years.  Lung Cancer Screening: (Low Dose CT Chest recommended if Age 33-80 years, 30 pack-year currently smoking OR have quit w/in 15years.) does qualify.   Lung Cancer Screening Referral: ordered 11/11/22  Additional Screening:  Hepatitis C Screening: does qualify; Completed 06/17/22  Vision Screening: Recommended annual ophthalmology exams for early detection of glaucoma and other disorders of the eye. Is the patient up to date with their annual eye exam?  Yes  Who is the provider or what is the name of the office in which the patient attends annual eye exams? Dr.Bell If pt is not established with a provider, would they like to be referred to a provider to establish care? No .   Dental Screening: Recommended annual dental exams for proper oral hygiene  Community Resource Referral / Chronic Care Management: CRR required this visit?  No  CCM required this visit?  No      Plan:     I have personally reviewed and noted the following in the patient's chart:   Medical and social history Use of alcohol, tobacco or illicit drugs  Current medications and supplements including opioid prescriptions. Patient is not currently taking opioid prescriptions. Functional ability and status Nutritional status Physical activity Advanced directives List of other physicians Hospitalizations, surgeries, and ER visits in previous 12 months Vitals Screenings to include cognitive, depression, and falls Referrals and appointments  In addition, I have reviewed and discussed with patient certain preventive protocols, quality metrics, and best practice recommendations. A written personalized care plan for preventive services as well as general preventive health recommendations were provided to patient.     Hal Hope, LPN   10/31/1476   Nurse Notes: none

## 2023-03-24 NOTE — Telephone Encounter (Signed)
Spoke to pt and informed her that you Agree with her. Decrease amlodipine from 7.5mg  qd to 2.5mg  qd to see BP increases. Please ensure she is not dizzy or having CP  Sch follow up with me   Pt  stated that she is dizzy a lot anyway mainly when she bends down to pick up something  no Cp though has appt on 04/01/23 with you scheduled pt verbalized understanding

## 2023-03-24 NOTE — Patient Instructions (Signed)
Wanda Clayton , Thank you for taking time to come for your Medicare Wellness Visit. I appreciate your ongoing commitment to your health goals. Please review the following plan we discussed and let me know if I can assist you in the future.   These are the goals we discussed:  Goals       DIET - EAT MORE FRUITS AND VEGETABLES      Quit smoking (pt-stated)      Plans to decrease/stop smoking soon when life stressors settle a bit more      Weight goal 135lb (pt-stated)      Stay active Healthy diet        This is a list of the screening recommended for you and due dates:  Health Maintenance  Topic Date Due   Zoster (Shingles) Vaccine (1 of 2) Never done   COVID-19 Vaccine (3 - 2023-24 season) 06/13/2022   Flu Shot  05/14/2023   Mammogram  11/06/2023   Medicare Annual Wellness Visit  03/23/2024   DTaP/Tdap/Td vaccine (2 - Tdap) 02/12/2026   Colon Cancer Screening  08/19/2026   DEXA scan (bone density measurement)  Completed   Hepatitis C Screening  Completed   HPV Vaccine  Aged Out   Screening for Lung Cancer  Discontinued   Pneumonia Vaccine  Discontinued    Advanced directives: no  Conditions/risks identified: none  Next appointment: Follow up in one year for your annual wellness visit 03/25/24 @ 8:15 am by phone   Preventive Care 65 Years and Older, Female Preventive care refers to lifestyle choices and visits with your health care provider that can promote health and wellness. What does preventive care include? A yearly physical exam. This is also called an annual well check. Dental exams once or twice a year. Routine eye exams. Ask your health care provider how often you should have your eyes checked. Personal lifestyle choices, including: Daily care of your teeth and gums. Regular physical activity. Eating a healthy diet. Avoiding tobacco and drug use. Limiting alcohol use. Practicing safe sex. Taking low-dose aspirin every day. Taking vitamin and mineral  supplements as recommended by your health care provider. What happens during an annual well check? The services and screenings done by your health care provider during your annual well check will depend on your age, overall health, lifestyle risk factors, and family history of disease. Counseling  Your health care provider may ask you questions about your: Alcohol use. Tobacco use. Drug use. Emotional well-being. Home and relationship well-being. Sexual activity. Eating habits. History of falls. Memory and ability to understand (cognition). Work and work Astronomer. Reproductive health. Screening  You may have the following tests or measurements: Height, weight, and BMI. Blood pressure. Lipid and cholesterol levels. These may be checked every 5 years, or more frequently if you are over 65 years old. Skin check. Lung cancer screening. You may have this screening every year starting at age 32 if you have a 30-pack-year history of smoking and currently smoke or have quit within the past 15 years. Fecal occult blood test (FOBT) of the stool. You may have this test every year starting at age 63. Flexible sigmoidoscopy or colonoscopy. You may have a sigmoidoscopy every 5 years or a colonoscopy every 10 years starting at age 15. Hepatitis C blood test. Hepatitis B blood test. Sexually transmitted disease (STD) testing. Diabetes screening. This is done by checking your blood sugar (glucose) after you have not eaten for a while (fasting). You may have this  done every 1-3 years. Bone density scan. This is done to screen for osteoporosis. You may have this done starting at age 26. Mammogram. This may be done every 1-2 years. Talk to your health care provider about how often you should have regular mammograms. Talk with your health care provider about your test results, treatment options, and if necessary, the need for more tests. Vaccines  Your health care provider may recommend certain  vaccines, such as: Influenza vaccine. This is recommended every year. Tetanus, diphtheria, and acellular pertussis (Tdap, Td) vaccine. You may need a Td booster every 10 years. Zoster vaccine. You may need this after age 64. Pneumococcal 13-valent conjugate (PCV13) vaccine. One dose is recommended after age 92. Pneumococcal polysaccharide (PPSV23) vaccine. One dose is recommended after age 38. Talk to your health care provider about which screenings and vaccines you need and how often you need them. This information is not intended to replace advice given to you by your health care provider. Make sure you discuss any questions you have with your health care provider. Document Released: 10/26/2015 Document Revised: 06/18/2016 Document Reviewed: 07/31/2015 Elsevier Interactive Patient Education  2017 [employer] Prevention in the Home Falls can cause injuries. They can happen to people of all ages. There are many things you can do to make your home safe and to help prevent falls. What can I do on the outside of my home? Regularly fix the edges of walkways and driveways and fix any cracks. Remove anything that might make you trip as you walk through a door, such as a raised step or threshold. Trim any bushes or trees on the path to your home. Use bright outdoor lighting. Clear any walking paths of anything that might make someone trip, such as rocks or tools. Regularly check to see if handrails are loose or broken. Make sure that both sides of any steps have handrails. Any raised decks and porches should have guardrails on the edges. Have any leaves, snow, or ice cleared regularly. Use sand or salt on walking paths during winter. Clean up any spills in your garage right away. This includes oil or grease spills. What can I do in the bathroom? Use night lights. Install grab bars by the toilet and in the tub and shower. Do not use towel bars as grab bars. Use non-skid mats or decals in  the tub or shower. If you need to sit down in the shower, use a plastic, non-slip stool. Keep the floor dry. Clean up any water that spills on the floor as soon as it happens. Remove soap buildup in the tub or shower regularly. Attach bath mats securely with double-sided non-slip rug tape. Do not have throw rugs and other things on the floor that can make you trip. What can I do in the bedroom? Use night lights. Make sure that you have a light by your bed that is easy to reach. Do not use any sheets or blankets that are too big for your bed. They should not hang down onto the floor. Have a firm chair that has side arms. You can use this for support while you get dressed. Do not have throw rugs and other things on the floor that can make you trip. What can I do in the kitchen? Clean up any spills right away. Avoid walking on wet floors. Keep items that you use a lot in easy-to-reach places. If you need to reach something above you, use a strong step stool that  has a grab bar. Keep electrical cords out of the way. Do not use floor polish or wax that makes floors slippery. If you must use wax, use non-skid floor wax. Do not have throw rugs and other things on the floor that can make you trip. What can I do with my stairs? Do not leave any items on the stairs. Make sure that there are handrails on both sides of the stairs and use them. Fix handrails that are broken or loose. Make sure that handrails are as long as the stairways. Check any carpeting to make sure that it is firmly attached to the stairs. Fix any carpet that is loose or worn. Avoid having throw rugs at the top or bottom of the stairs. If you do have throw rugs, attach them to the floor with carpet tape. Make sure that you have a light switch at the top of the stairs and the bottom of the stairs. If you do not have them, ask someone to add them for you. What else can I do to help prevent falls? Wear shoes that: Do not have high  heels. Have rubber bottoms. Are comfortable and fit you well. Are closed at the toe. Do not wear sandals. If you use a stepladder: Make sure that it is fully opened. Do not climb a closed stepladder. Make sure that both sides of the stepladder are locked into place. Ask someone to hold it for you, if possible. Clearly mark and make sure that you can see: Any grab bars or handrails. First and last steps. Where the edge of each step is. Use tools that help you move around (mobility aids) if they are needed. These include: Canes. Walkers. Scooters. Crutches. Turn on the lights when you go into a dark area. Replace any light bulbs as soon as they burn out. Set up your furniture so you have a clear path. Avoid moving your furniture around. If any of your floors are uneven, fix them. If there are any pets around you, be aware of where they are. Review your medicines with your doctor. Some medicines can make you feel dizzy. This can increase your chance of falling. Ask your doctor what other things that you can do to help prevent falls. This information is not intended to replace advice given to you by your health care provider. Make sure you discuss any questions you have with your health care provider. Document Released: 07/26/2009 Document Revised: 03/06/2016 Document Reviewed: 11/03/2014 Elsevier Interactive Patient Education  2017 [employer].

## 2023-03-25 ENCOUNTER — Ambulatory Visit: Payer: No Typology Code available for payment source | Admitting: Family

## 2023-04-01 ENCOUNTER — Ambulatory Visit (INDEPENDENT_AMBULATORY_CARE_PROVIDER_SITE_OTHER): Payer: No Typology Code available for payment source | Admitting: Family

## 2023-04-01 ENCOUNTER — Encounter: Payer: Self-pay | Admitting: Family

## 2023-04-01 VITALS — BP 102/70 | HR 95 | Temp 98.2°F | Ht 65.0 in | Wt 133.2 lb

## 2023-04-01 DIAGNOSIS — R5383 Other fatigue: Secondary | ICD-10-CM

## 2023-04-01 DIAGNOSIS — Q891 Congenital malformations of adrenal gland: Secondary | ICD-10-CM | POA: Diagnosis not present

## 2023-04-01 DIAGNOSIS — I10 Essential (primary) hypertension: Secondary | ICD-10-CM

## 2023-04-01 NOTE — Assessment & Plan Note (Addendum)
Symptomatic hypotension . Reassuring neurologic exam.  History of carotid stenosis.  Patient politely declines  updating ultrasound carotid at this time.  Advised to stop amlodipine 2.5 altogether. Question if some of presentation is  related to vertigo.  We agreed to first stop amlodipine with close follow-up to determine next steps.  Would pursue MRI brain, ultrasound carotid, extensive lab evaluation if symptoms persist

## 2023-04-01 NOTE — Progress Notes (Signed)
Assessment & Plan:  Adrenal gland anomaly -     Ambulatory referral to Endocrinology  Other fatigue Assessment & Plan: Question if post viral syndrome ( ? Covid 01/2023), chronic interrupted sleep.  Discussed evaluation for sleep apnea patient politely declines.  She declines labs to look for electrolyte abnormality, nutritional deficiency Trial OTC ZZZQuil as antihistamine and may relieve nasal congestion, aid sleep. Close follow up.    Primary hypertension Assessment & Plan: Symptomatic hypotension . Reassuring neurologic exam.  History of carotid stenosis.  Patient politely declines  updating ultrasound carotid at this time.  Advised to stop amlodipine 2.5 altogether. Question if some of presentation is  related to vertigo.  We agreed to first stop amlodipine with close follow-up to determine next steps.  Would pursue MRI brain, ultrasound carotid, extensive lab evaluation if symptoms persist      Return precautions given.   Risks, benefits, and alternatives of the medications and treatment plan prescribed today were discussed, and patient expressed understanding.   Education regarding symptom management and diagnosis given to patient on AVS either electronically or printed.  Return in about 1 week (around 04/08/2023).  Rennie Plowman, FNP  Subjective:    Patient ID: [name], female    DOB: July 22, 1951, 72 y.o.   MRN: [id number]  CC: ELIZETH SPARLING is a 72 y.o. female who presents today for an acute visit.    HPI: Complains fatigue for months, unchanged.   Most noticeable in the mornings.   She notes energy improves in the afternoon.  'I have not slept a full night in years'. She has trouble staying asleep, disrupted by clear nasal drainage.   She clinches her teeth at night. Unsure if she snores. She has chronic HA across bilateral temples.  Sleep is not restorative.   She describes feeling dizzy if 'I bend over' or get out of bed x 2 months.  She has trouble  laying back in the dentist chair which is been ongoing for years.  She does not necessarily feel the room will spin although couple years ago she did have an episode of vertigo .  blood pressure at home running in the 70s and 80s.  She reduced amlodipine to 2.5 mg daily  No syncope, chest pain, palpitations, worse HA of life, numbness in face/ arms, vertigo, nausea, ear pain, unusual weight loss, fever  Onset of dizziness 2 months ago after flu like symptoms in which she cough, chills without fever. She had been seeing her mother in the nursing home whom contracted covid and pneumonia.   She follows low sodium diet.     Follow-up Dr. Mariah Milling 10/28/2022 atherosclerosis of aorta, hypertension, mixed hyperlipidemia, carotid stenosis.  Ultrasound carotid 12/30/2019 mild right and left stenosis  Mammogram is up-to-date  Allergies: Pollen extract Current Outpatient Medications on File Prior to Visit  Medication Sig Dispense Refill   amLODipine (NORVASC) 2.5 MG tablet Take 1 tablet (2.5 mg total) by mouth daily. 90 tablet 1   aspirin 81 MG tablet Take 81 mg by mouth daily.     atorvastatin (LIPITOR) 40 MG tablet TAKE 1 TABLET BY MOUTH DAILY AT 6 PM. 90 tablet 3   ezetimibe (ZETIA) 10 MG tablet Take 1 tablet (10 mg total) by mouth daily. 90 tablet 3   Multiple Vitamins-Minerals (EQ MULTIVITAMINS ADULT GUMMY PO) Take by mouth daily.     amLODipine (NORVASC) 5 MG tablet TAKE 1 AND 1/2 TABLETS DAILY BY MOUTH (Patient not taking: Reported on 03/24/2023)  135 tablet 1   No current facility-administered medications on file prior to visit.    Review of Systems  Constitutional:  Positive for fatigue. Negative for chills and fever.  HENT:  Positive for congestion. Negative for sinus pressure.   Eyes:  Negative for visual disturbance.  Respiratory:  Negative for cough.   Cardiovascular:  Negative for chest pain and palpitations.  Gastrointestinal:  Negative for nausea and vomiting.  Neurological:   Positive for dizziness and headaches. Negative for numbness.  Psychiatric/Behavioral:  Positive for sleep disturbance.       Objective:    BP 102/70   Pulse 95   Temp 98.2 F (36.8 C) (Oral)   Ht 5\' 5"  (1.651 m)   Wt 133 lb 3.2 oz (60.4 kg)   SpO2 99%   BMI 22.17 kg/m   BP Readings from Last 3 Encounters:  04/01/23 102/70  03/24/23 (!) 88/56  11/17/22 132/78   Wt Readings from Last 3 Encounters:  04/01/23 133 lb 3.2 oz (60.4 kg)  03/24/23 131 lb (59.4 kg)  11/17/22 137 lb 3.2 oz (62.2 kg)    Physical Exam Vitals reviewed.  Constitutional:      Appearance: She is well-developed.  HENT:     Mouth/Throat:     Pharynx: Uvula midline.  Eyes:     Conjunctiva/sclera: Conjunctivae normal.     Pupils: Pupils are equal, round, and reactive to light.     Comments: Fundus normal bilaterally.   Cardiovascular:     Rate and Rhythm: Normal rate and regular rhythm.     Pulses: Normal pulses.     Heart sounds: Normal heart sounds.  Pulmonary:     Effort: Pulmonary effort is normal.     Breath sounds: Normal breath sounds. No wheezing, rhonchi or rales.  Skin:    General: Skin is warm and dry.  Neurological:     Mental Status: She is alert.     Cranial Nerves: No cranial nerve deficit.     Sensory: No sensory deficit.     Deep Tendon Reflexes:     Reflex Scores:      Bicep reflexes are 2+ on the right side and 2+ on the left side.      Patellar reflexes are 2+ on the right side and 2+ on the left side.    Comments: Grip equal and strong bilateral upper extremities. Gait strong and steady. Able to perform rapid alternating movement without difficulty.   Psychiatric:        Speech: Speech normal.        Behavior: Behavior normal.        Thought Content: Thought content normal.

## 2023-04-01 NOTE — Assessment & Plan Note (Addendum)
Question if post viral syndrome ( ? Covid 01/2023), chronic interrupted sleep.  Discussed evaluation for sleep apnea patient politely declines.  She declines labs to look for electrolyte abnormality, nutritional deficiency Trial OTC ZZZQuil as antihistamine and may relieve nasal congestion, aid sleep. Close follow up.

## 2023-04-01 NOTE — Patient Instructions (Addendum)
Essentials for good sleep:   #1 Exercise #2 Limit Caffeine ( no caffeine after lunch) #3 No smart phones, TV prior to bed -- BLUE light is VERY activating and send the brain an 'awake message.'  #4 Go to bed at same time of night each night and get up at same time of day.  #5 Take 0.5 to 5mg  melatonin at 7pm with dinner -this is when natural melatonin will start to increase #6 May try over the counter ZZZQuil ( benadryl) for sleep aid   Hold amlodipine and monitor blood pressure.   Referral to endocrine  Let us know if you dont hear back within a week in regards to an appointment being scheduled.   So that you are aware, if you are [facility] MyChart user , please pay attention to your MyChart messages as you may receive a MyChart message with a phone number to call and schedule this test/appointment own your own from our referral coordinator. This is a new process so I do not want you to miss this message.  If you are not a MyChart user, you will receive a phone call.    Let me know how you are doing

## 2023-04-14 ENCOUNTER — Other Ambulatory Visit: Payer: Self-pay

## 2023-04-14 DIAGNOSIS — E785 Hyperlipidemia, unspecified: Secondary | ICD-10-CM

## 2023-04-14 MED ORDER — ATORVASTATIN CALCIUM 40 MG PO TABS: ORAL_TABLET | ORAL | 3 refills | Status: DC

## 2023-04-15 ENCOUNTER — Ambulatory Visit: Payer: No Typology Code available for payment source | Admitting: Family

## 2023-04-15 ENCOUNTER — Encounter: Payer: Self-pay | Admitting: Family

## 2023-04-15 VITALS — BP 110/70 | HR 98 | Temp 97.9°F | Ht 65.0 in | Wt 134.6 lb

## 2023-04-15 DIAGNOSIS — R42 Dizziness and giddiness: Secondary | ICD-10-CM

## 2023-04-15 DIAGNOSIS — I10 Essential (primary) hypertension: Secondary | ICD-10-CM

## 2023-04-15 DIAGNOSIS — R251 Tremor, unspecified: Secondary | ICD-10-CM | POA: Diagnosis not present

## 2023-04-15 DIAGNOSIS — R9431 Abnormal electrocardiogram [ECG] [EKG]: Secondary | ICD-10-CM | POA: Diagnosis not present

## 2023-04-15 DIAGNOSIS — L819 Disorder of pigmentation, unspecified: Secondary | ICD-10-CM

## 2023-04-15 LAB — CBC WITH DIFFERENTIAL/PLATELET
Basophils Absolute: 0.1 10*3/uL (ref 0.0–0.1)
Basophils Relative: 0.8 % (ref 0.0–3.0)
Eosinophils Absolute: 0.1 10*3/uL (ref 0.0–0.7)
Eosinophils Relative: 0.6 % (ref 0.0–5.0)
HCT: 41.6 % (ref 36.0–46.0)
Hemoglobin: 13.9 g/dL (ref 12.0–15.0)
Lymphocytes Relative: 29.2 % (ref 12.0–46.0)
Lymphs Abs: 2.4 10*3/uL (ref 0.7–4.0)
MCHC: 33.5 g/dL (ref 30.0–36.0)
MCV: 100.8 fl — ABNORMAL HIGH (ref 78.0–100.0)
Monocytes Absolute: 0.6 10*3/uL (ref 0.1–1.0)
Monocytes Relative: 7.3 % (ref 3.0–12.0)
Neutro Abs: 5.2 10*3/uL (ref 1.4–7.7)
Neutrophils Relative %: 62.1 % (ref 43.0–77.0)
Platelets: 263 10*3/uL (ref 150.0–400.0)
RBC: 4.13 Mil/uL (ref 3.87–5.11)
RDW: 13.8 % (ref 11.5–15.5)
WBC: 8.4 10*3/uL (ref 4.0–10.5)

## 2023-04-15 LAB — COMPREHENSIVE METABOLIC PANEL
ALT: 41 U/L — ABNORMAL HIGH (ref 0–35)
AST: 52 U/L — ABNORMAL HIGH (ref 0–37)
Albumin: 3.8 g/dL (ref 3.5–5.2)
Alkaline Phosphatase: 95 U/L (ref 39–117)
BUN: 10 mg/dL (ref 6–23)
CO2: 28 mEq/L (ref 19–32)
Calcium: 8.8 mg/dL (ref 8.4–10.5)
Chloride: 102 mEq/L (ref 96–112)
Creatinine, Ser: 0.86 mg/dL (ref 0.40–1.20)
GFR: 67.69 mL/min (ref >60.00)
Glucose, Bld: 90 mg/dL (ref 70–99)
Potassium: 4 mEq/L (ref 3.5–5.1)
Sodium: 138 mEq/L (ref 135–145)
Total Bilirubin: 0.6 mg/dL (ref 0.2–1.2)
Total Protein: 6.9 g/dL (ref 6.0–8.3)

## 2023-04-15 LAB — TSH: TSH: 2.29 u[IU]/mL (ref 0.35–5.50)

## 2023-04-15 MED ORDER — ALPRAZOLAM 0.5 MG PO TABS: 0.5000 mg | ORAL_TABLET | Freq: Once | ORAL | 0 refills | Status: AC

## 2023-04-15 MED ORDER — MECLIZINE HCL 12.5 MG PO TABS: 12.5000 mg | ORAL_TABLET | Freq: Three times a day (TID) | ORAL | 0 refills | Status: DC | PRN

## 2023-04-15 NOTE — Assessment & Plan Note (Addendum)
Advised consult with vascular for further evaluation. Patient has long history of smoking.  In the absence of intermittent claudication, patient prefers to monitor.

## 2023-04-15 NOTE — Assessment & Plan Note (Addendum)
Presentation c/w with vertigo. I suspect this is aggravated by borderline orthostatic hypotension.  See flowsheet. Pending MRI brain w/ IAC and trial of meclizine. She declines vestibular rehab at this time.  EKG obtained due to initial elevation of HR which improved as she rested in exam room ( she had smoked a cigarette prior to visit) . EKG show sinus tachycardia, PACs. No significant change from prior EKG 10/28/22.  I have ordered  echocardiogram to evaluate for structural heart disease.  Pending ultrasound carotid. Discussed smoking, dehydration and caffeine as aggravating . I have shared notes with Dr Mariah Milling in regards to potential burden of PACs in setting of symptoms and if further evaluation ( Zio monitor) would be indicated. close follow up.

## 2023-04-15 NOTE — Assessment & Plan Note (Signed)
Overall stable. She is no longer on antihypertensive. She is off of amlodipine 7.5mg  for now. Will monitor.

## 2023-04-15 NOTE — Assessment & Plan Note (Signed)
Chronic, stable.  Presentation consistent with essential tremor.  Will monitor and discuss how this impact QOL ongoing.

## 2023-04-15 NOTE — Progress Notes (Signed)
Assessment & Plan:  Vertigo Assessment & Plan: Presentation c/w with vertigo. I suspect this is aggravated by borderline orthostatic hypotension.  See flowsheet. Pending MRI brain w/ IAC and trial of meclizine. She declines vestibular rehab at this time.  EKG obtained due to initial elevation of HR which improved as she rested in exam room ( she had smoked a cigarette prior to visit) . EKG show sinus tachycardia, PACs. No significant change from prior EKG 10/28/22.  I have ordered  echocardiogram to evaluate for structural heart disease.  Pending ultrasound carotid. Discussed smoking, dehydration and caffeine as aggravating . I have shared notes with Dr Mariah Milling in regards to potential burden of PACs in setting of symptoms and if further evaluation ( Zio monitor) would be indicated. close follow up.   Orders: -     US Carotid Bilateral; Future -     MR BRAIN/IAC W WO CONTRAST; Future -     Meclizine HCl; Take 1 tablet (12.5 mg total) by mouth 3 (three) times daily as needed (vertigo).  Dispense: 30 tablet; Refill: 0 -     ALPRAZolam; Take 1 tablet (0.5 mg total) by mouth once for 1 dose. Take 15 - 30 minutes prior to appointment time.  Dispense: 2 tablet; Refill: 0 -     EKG 12-Lead  Discoloration of skin of lower leg Assessment & Plan: Advised consult with vascular for further evaluation. Patient has long history of smoking.  In the absence of intermittent claudication, patient prefers to monitor.   Abnormal EKG -     ECHOCARDIOGRAM COMPLETE; Future -     TSH -     Comprehensive metabolic panel -     CBC with Differential/Platelet  Primary hypertension Assessment & Plan: Overall stable. She is no longer on antihypertensive. She is off of amlodipine 7.5mg  for now. Will monitor.    Tremor Assessment & Plan: Chronic, stable.  Presentation consistent with essential tremor.  Will monitor and discuss how this impact QOL ongoing.      Return precautions given.   Risks, benefits,  and alternatives of the medications and treatment plan prescribed today were discussed, and patient expressed understanding.   Education regarding symptom management and diagnosis given to patient on AVS either electronically or printed.  Return in about 6 weeks (around 05/27/2023).  Rennie Plowman, FNP  Subjective:    [name], female    DOB: 11-Sep-1951, 72 y.o.   MRN: [id number]  CC: Wanda Clayton is a 72 y.o. female who presents today for follow up.   HPI: Continues to complain of vertigo. Symptom started 4 months ago. Onset after a cold.   Continues to feel vertigo when learning forward to pick up object off the floor. Symptom aggravated by laying supine.   She describes room starts to spin. Denies feeling lightheaded or feeling as she may pass out. She cannot turn her head quickly as room will start to spin.    She doesn't feel like she will pass out.   She is unsure if stopping amlodipine last week was helpful.   She endorses chronic dull HA, which can be aggrevated by anxiety. HA is not worse HA of her life.   Denies vision changes, cp, palpitations, sob, wheezing, pulsatile tinnitus, syncope.   She wears hearing aids.   Denies intermittent claudication.   She has seen vascular in the past.            No longer amlodipine 2.5 mg or  5mg  amlodipine.    13 days ago one where BP 67/51, HR 108. Since then BP approx 114/83.   She drinks two 8 ounce waters per day. She doesn't drink much water.   she drinks coffee, 12 ounces of coffee per day.   Smoked a cigarette prior to visit.    No sudafed, NSAID or drug use.    Scheduled with endocrine 05/2023  Follow-up Dr. Mariah Milling 10/28/2022 for CAD, aortic atherosclerosis, HTN  Allergies: Pollen extract Current Outpatient Medications on File Prior to Visit  Medication Sig Dispense Refill   aspirin 81 MG tablet Take 81 mg by mouth daily.     atorvastatin (LIPITOR) 40 MG tablet TAKE 1 TABLET BY  MOUTH DAILY AT 6 PM. 90 tablet 3   ezetimibe (ZETIA) 10 MG tablet Take 1 tablet (10 mg total) by mouth daily. 90 tablet 3   Multiple Vitamins-Minerals (EQ MULTIVITAMINS ADULT GUMMY PO) Take by mouth daily.     No current facility-administered medications on file prior to visit.    Review of Systems  Constitutional:  Negative for chills, fever and unexpected weight change.  HENT:  Positive for hearing loss.   Eyes:  Negative for visual disturbance.  Respiratory:  Negative for cough and shortness of breath.   Cardiovascular:  Negative for chest pain and palpitations.  Gastrointestinal:  Negative for nausea and vomiting.  Neurological:  Positive for tremors and headaches. Negative for dizziness.      Objective:    BP 110/70   Pulse 98   Temp 97.9 F (36.6 C) (Oral)   Ht 5\' 5"  (1.651 m)   Wt 134 lb 9.6 oz (61.1 kg)   SpO2 99%   BMI 22.40 kg/m  BP Readings from Last 3 Encounters:  04/15/23 110/70  04/01/23 102/70  03/24/23 (!) 88/56   Wt Readings from Last 3 Encounters:  04/15/23 134 lb 9.6 oz (61.1 kg)  04/01/23 133 lb 3.2 oz (60.4 kg)  03/24/23 131 lb (59.4 kg)   Orthostatic VS for the past 24 hrs (Last 3 readings):  BP- Lying Pulse- Lying BP- Sitting Pulse- Sitting BP- Standing at 0 minutes Pulse- Standing at 0 minutes  04/15/23 1105 116/78 105 110/70 93 98/78 110    Physical Exam Vitals reviewed.  Constitutional:      Appearance: She is well-developed.  HENT:     Head: Normocephalic and atraumatic.     Right Ear: Hearing, tympanic membrane, ear canal and external ear normal. No swelling or tenderness. No middle ear effusion. Tympanic membrane is not erythematous or bulging.     Left Ear: Tympanic membrane, ear canal and external ear normal. No swelling or tenderness.  No middle ear effusion. Tympanic membrane is not erythematous or bulging.     Nose: Nose normal. No rhinorrhea.     Right Sinus: No maxillary sinus tenderness or frontal sinus tenderness.     Left  Sinus: No maxillary sinus tenderness or frontal sinus tenderness.     Mouth/Throat:     Pharynx: Uvula midline. No posterior oropharyngeal erythema.  Eyes:     General: Lids are normal. Lids are everted, no foreign bodies appreciated.     Conjunctiva/sclera: Conjunctivae normal.     Pupils: Pupils are equal, round, and reactive to light.     Comments: Normal fundus bilaterally   Cardiovascular:     Rate and Rhythm: Normal rate and regular rhythm.     Pulses: Normal pulses.     Heart sounds: Normal heart sounds.  Comments: Diminished pedal pulses bilaterally.  Varicosities and hyperpigmented present. Pulmonary:     Effort: Pulmonary effort is normal.     Breath sounds: Normal breath sounds. No wheezing, rhonchi or rales.  Lymphadenopathy:     Head:     Right side of head: No submental, submandibular, tonsillar, preauricular, posterior auricular or occipital adenopathy.     Left side of head: No submental, submandibular, tonsillar, preauricular, posterior auricular or occipital adenopathy.     Cervical: No cervical adenopathy.     Right cervical: No superficial, deep or posterior cervical adenopathy.    Left cervical: No superficial, deep or posterior cervical adenopathy.  Skin:    General: Skin is warm and dry.  Neurological:     Mental Status: She is alert.     Cranial Nerves: No cranial nerve deficit.     Sensory: No sensory deficit.     Motor: Tremor present.     Deep Tendon Reflexes:     Reflex Scores:      Bicep reflexes are 2+ on the right side and 2+ on the left side.      Patellar reflexes are 2+ on the right side and 2+ on the left side.    Comments: 4/5 upper and lower extremities bilaterally. Gait is wide based. Unable to lay supine on table as she felt room begin to spin.  Able to perform  finger-to-nose without difficulty. Fine tremor noted bilaterally with extension of hands forward and when bringing finger to nose.  No cog wheeling, shuffling gait.     Psychiatric:        Speech: Speech normal.        Behavior: Behavior normal.        Thought Content: Thought content normal.   I have spent 40 minutes with a patient including precharting, exam, reviewing medical records, and discussion plan of care.

## 2023-04-15 NOTE — Patient Instructions (Addendum)
Do not drive on xanax, Take xanax prior to MRI brain.   Ordered mri brain , echocardiogram and carotid ultrasound  Let us know if you dont hear back within a week in regards to an appointment being scheduled.   So that you are aware, if you are [facility] MyChart user , please pay attention to your MyChart messages as you may receive a MyChart message with a phone number to call and schedule this test/appointment own your own from our referral coordinator. This is a new process so I do not want you to miss this message.  If you are not a MyChart user, you will receive a phone call.  Please consider physical therapy including vestibular rehab which is physical therapy targeted to treat  vertigo.  I have sent in meclizine which is a medication you can trial for vertigo.  It is also an antihistamine.  Increase water, salt.   Careful with position changes  Vertigo Vertigo is the feeling that you or your surroundings are moving when they are not. This feeling can come and go at any time. Vertigo often goes away on its own. Vertigo can be dangerous if it occurs while you are doing something that could endanger yourself or others, such as driving or operating machinery. Your health care provider will do tests to try to determine the cause of your vertigo. Tests will also help your health care provider decide how best to treat your condition. Follow these instructions at home: Eating and drinking     Dehydration can make vertigo worse. Drink enough fluid to keep your urine pale yellow. Do not drink alcohol. Activity Return to your normal activities as told by your health care provider. Ask your health care provider what activities are safe for you. In the morning, first sit up on the side of the bed. When you feel okay, stand slowly while you hold onto something until you know that your balance is fine. Move slowly. Avoid sudden body or head movements or certain positions, as told by your health care  provider. If you have trouble walking or keeping your balance, try using a cane for stability. If you feel dizzy or unstable, sit down right away. Avoid doing any tasks that would cause danger to you or others if vertigo occurs. Avoid bending down if you feel dizzy. Place items in your home so that they are easy for you to reach without bending or leaning over. Do not drive or use machinery if you feel dizzy. General instructions Take over-the-counter and prescription medicines only as told by your health care provider. Keep all follow-up visits. This is important. Contact a health care provider if: Your medicines do not relieve your vertigo or they make it worse. Your condition gets worse or you develop new symptoms. You have a fever. You develop nausea or vomiting, or if nausea gets worse. Your family or friends notice any behavioral changes. You have numbness or a prickling and tingling sensation in part of your body. Get help right away if you: Are always dizzy or you faint. Develop severe headaches. Develop a stiff neck. Develop sensitivity to light. Have difficulty moving or speaking. Have weakness in your hands, arms, or legs. Have changes in your hearing or vision. These symptoms may represent a serious problem that is an emergency. Do not wait to see if the symptoms will go away. Get medical help right away. Call your local emergency services (911 in the [place] Do not drive yourself to the  hospital. Summary Vertigo is the feeling that you or your surroundings are moving when they are not. Your health care provider will do tests to try to determine the cause of your vertigo. Follow instructions for home care. You may be told to avoid certain tasks, positions, or movements. Contact a health care provider if your medicines do not relieve your symptoms, or if you have a fever, nausea, vomiting, or changes in behavior. Get help right away if you have severe headaches or difficulty  speaking, or you develop hearing or vision problems. This information is not intended to replace advice given to you by your health care provider. Make sure you discuss any questions you have with your health care provider. Document Revised: 08/29/2020 Document Reviewed: 08/29/2020 Elsevier Patient Education  2024 [employer]   Orthostatic Hypotension Blood pressure is a measurement of how strongly, or weakly, your circulating blood is pressing against the walls of your arteries. Orthostatic hypotension is a drop in blood pressure that can happen when you change positions, such as when you go from lying down to standing. Arteries are blood vessels that carry blood from your heart throughout your body. When blood pressure is too low, you may not get enough blood to your brain or to the rest of your organs. Orthostatic hypotension can cause light-headedness, sweating, rapid heartbeat, blurred vision, and fainting. These symptoms require further investigation into the cause. What are the causes? Orthostatic hypotension can be caused by many things, including: Sudden changes in posture, such as standing up quickly after you have been sitting or lying down. Loss of blood (anemia) or loss of body fluids (dehydration). Heart problems, neurologic problems, or hormone problems. Pregnancy. Aging. The risk for this condition increases as you get older. Severe infection (sepsis). Certain medicines, such as medicines for high blood pressure or medicines that make the body lose excess fluids (diuretics). What are the signs or symptoms? Symptoms of this condition may include: Weakness, light-headedness, or dizziness. Sweating. Blurred vision. Tiredness (fatigue). Rapid heartbeat. Fainting, in severe cases. How is this diagnosed? This condition is diagnosed based on: Your symptoms and medical history. Your blood pressure measurements. Your health care provider will check your blood pressure when you  are: Lying down. Sitting. Standing. A blood pressure reading is recorded as two numbers, such as "120 over 80" (or 120/80). The first ("top") number is called the systolic pressure. It is a measure of the pressure in your arteries as your heart beats. The second ("bottom") number is called the diastolic pressure. It is a measure of the pressure in your arteries when your heart relaxes between beats. Blood pressure is measured in a unit called mmHg. Healthy blood pressure for most adults is 120/80 mmHg. Orthostatic hypotension is defined as a 20 mmHg drop in systolic pressure or a 10 mmHg drop in diastolic pressure within 3 minutes of standing. Other information or tests that may be used to diagnose orthostatic hypotension include: Your other vital signs, such as your heart rate and temperature. Blood tests. An electrocardiogram (ECG) or echocardiogram. A Holter monitor. This is a device you wear that records your heart rhythm continuously, usually for 24-48 hours. Tilt table test. For this test, you will be safely secured to a table that moves you from a lying position to an upright position. Your heart rhythm and blood pressure will be monitored during the test. How is this treated? This condition may be treated by: Changing your diet. This may involve eating more salt (sodium) or  drinking more water. Changing the dosage of certain medicines you are taking that might be lowering your blood pressure. Correcting the underlying reason for the orthostatic hypotension. Wearing compression stockings. Taking medicines to raise your blood pressure. Avoiding actions that trigger symptoms. Follow these instructions at home: Medicines Take over-the-counter and prescription medicines only as told by your health care provider. Follow instructions from your health care provider about changing the dosage of your current medicines, if this applies. Do not stop or adjust any of your medicines on your  own. Eating and drinking  Drink enough fluid to keep your urine pale yellow. Eat extra salt only as directed. Do not add extra salt to your diet unless advised by your health care provider. Eat frequent, small meals. Avoid standing up suddenly after eating. General instructions  Get up slowly from lying down or sitting positions. This gives your blood pressure a chance to adjust. Avoid hot showers and excessive heat as directed by your health care provider. Engage in regular physical activity as directed by your health care provider. If you have compression stockings, wear them as told. Keep all follow-up visits. This is important. Contact a health care provider if: You have a fever for more than 2-3 days. You feel more thirsty than usual. You feel dizzy or weak. Get help right away if: You have chest pain. You have a fast or irregular heartbeat. You become sweaty or feel light-headed. You feel short of breath. You faint. You have any symptoms of a stroke. "BE FAST" is an easy way to remember the main warning signs of a stroke: B - Balance. Signs are dizziness, sudden trouble walking, or loss of balance. E - Eyes. Signs are trouble seeing or a sudden change in vision. F - Face. Signs are sudden weakness or numbness of the face, or the face or eyelid drooping on one side. A - Arms. Signs are weakness or numbness in an arm. This happens suddenly and usually on one side of the body. S - Speech. Signs are sudden trouble speaking, slurred speech, or trouble understanding what people say. T - Time. Time to call emergency services. Write down what time symptoms started. You have other signs of a stroke, such as: A sudden, severe headache with no known cause. Nausea or vomiting. Seizure. These symptoms may represent a serious problem that is an emergency. Do not wait to see if the symptoms will go away. Get medical help right away. Call your local emergency services (911 in the [place] Do  not drive yourself to the hospital. Summary Orthostatic hypotension is a sudden drop in blood pressure. It can cause light-headedness, sweating, rapid heartbeat, blurred vision, and fainting. Orthostatic hypotension can be diagnosed by having your blood pressure taken while lying down, sitting, and then standing. Treatment may involve changing your diet, wearing compression stockings, sitting up slowly, adjusting your medicines, or correcting the underlying reason for the orthostatic hypotension. Get help right away if you have chest pain, a fast or irregular heartbeat, or symptoms of a stroke. This information is not intended to replace advice given to you by your health care provider. Make sure you discuss any questions you have with your health care provider. Document Revised: 12/13/2020 Document Reviewed: 12/13/2020 Elsevier Patient Education  2024 [employer].

## 2023-04-20 ENCOUNTER — Other Ambulatory Visit: Payer: Self-pay | Admitting: Family

## 2023-04-20 DIAGNOSIS — R899 Unspecified abnormal finding in specimens from other organs, systems and tissues: Secondary | ICD-10-CM

## 2023-04-24 ENCOUNTER — Other Ambulatory Visit: Payer: Self-pay | Admitting: Family

## 2023-04-24 DIAGNOSIS — I10 Essential (primary) hypertension: Secondary | ICD-10-CM

## 2023-04-27 ENCOUNTER — Telehealth: Payer: Self-pay

## 2023-04-27 NOTE — Telephone Encounter (Signed)
LVM to call back to go over results and schedule appt in office for labs as well

## 2023-04-28 ENCOUNTER — Other Ambulatory Visit (INDEPENDENT_AMBULATORY_CARE_PROVIDER_SITE_OTHER): Payer: No Typology Code available for payment source

## 2023-04-28 DIAGNOSIS — R899 Unspecified abnormal finding in specimens from other organs, systems and tissues: Secondary | ICD-10-CM

## 2023-04-28 LAB — B12 AND FOLATE PANEL
Folate: 10.9 ng/mL (ref >5.9)
Vitamin B-12: 315 pg/mL (ref 211–911)

## 2023-04-28 LAB — HEPATIC FUNCTION PANEL
ALT: 30 U/L (ref 0–35)
AST: 38 U/L — ABNORMAL HIGH (ref 0–37)
Albumin: 3.9 g/dL (ref 3.5–5.2)
Alkaline Phosphatase: 105 U/L (ref 39–117)
Bilirubin, Direct: 0.1 mg/dL (ref 0.0–0.3)
Total Bilirubin: 0.5 mg/dL (ref 0.2–1.2)
Total Protein: 6.8 g/dL (ref 6.0–8.3)

## 2023-04-28 LAB — CBC WITH DIFFERENTIAL/PLATELET
Basophils Absolute: 0.1 10*3/uL (ref 0.0–0.1)
Basophils Relative: 0.7 % (ref 0.0–3.0)
Eosinophils Absolute: 0.1 10*3/uL (ref 0.0–0.7)
Eosinophils Relative: 0.7 % (ref 0.0–5.0)
HCT: 43.9 % (ref 36.0–46.0)
Hemoglobin: 14.5 g/dL (ref 12.0–15.0)
Lymphocytes Relative: 23.6 % (ref 12.0–46.0)
Lymphs Abs: 2 10*3/uL (ref 0.7–4.0)
MCHC: 33.1 g/dL (ref 30.0–36.0)
MCV: 102.4 fl — ABNORMAL HIGH (ref 78.0–100.0)
Monocytes Absolute: 0.7 10*3/uL (ref 0.1–1.0)
Monocytes Relative: 8 % (ref 3.0–12.0)
Neutro Abs: 5.8 10*3/uL (ref 1.4–7.7)
Neutrophils Relative %: 67 % (ref 43.0–77.0)
Platelets: 267 10*3/uL (ref 150.0–400.0)
RBC: 4.29 Mil/uL (ref 3.87–5.11)
RDW: 14.4 % (ref 11.5–15.5)
WBC: 8.6 10*3/uL (ref 4.0–10.5)

## 2023-04-29 LAB — IRON,TIBC AND FERRITIN PANEL
%SAT: 32 % (calc) (ref 16–45)
Ferritin: 27 ng/mL (ref 16–288)
Iron: 97 ug/dL (ref 45–160)
TIBC: 299 mcg/dL (calc) (ref 250–450)

## 2023-05-04 ENCOUNTER — Ambulatory Visit
Admission: RE | Admit: 2023-05-04 | Discharge: 2023-05-04 | Disposition: A | Discharge status: Home / Self Care | Payer: No Typology Code available for payment source | Source: Ambulatory Visit | Attending: Family | Admitting: Family

## 2023-05-04 ENCOUNTER — Ambulatory Visit
Admission: RE | Admit: 2023-05-04 | Discharge: 2023-05-04 | Disposition: A | Discharge status: Home / Self Care | Payer: No Typology Code available for payment source | Source: Ambulatory Visit | Attending: Family

## 2023-05-04 DIAGNOSIS — R9431 Abnormal electrocardiogram [ECG] [EKG]: Secondary | ICD-10-CM | POA: Diagnosis not present

## 2023-05-04 DIAGNOSIS — R5383 Other fatigue: Secondary | ICD-10-CM | POA: Diagnosis not present

## 2023-05-04 DIAGNOSIS — E785 Hyperlipidemia, unspecified: Secondary | ICD-10-CM | POA: Diagnosis not present

## 2023-05-04 DIAGNOSIS — I10 Essential (primary) hypertension: Secondary | ICD-10-CM | POA: Insufficient documentation

## 2023-05-04 DIAGNOSIS — R55 Syncope and collapse: Secondary | ICD-10-CM | POA: Insufficient documentation

## 2023-05-04 DIAGNOSIS — R42 Dizziness and giddiness: Secondary | ICD-10-CM | POA: Insufficient documentation

## 2023-05-04 DIAGNOSIS — I6523 Occlusion and stenosis of bilateral carotid arteries: Secondary | ICD-10-CM | POA: Diagnosis not present

## 2023-05-04 DIAGNOSIS — F17200 Nicotine dependence, unspecified, uncomplicated: Secondary | ICD-10-CM | POA: Diagnosis not present

## 2023-05-04 DIAGNOSIS — I2510 Atherosclerotic heart disease of native coronary artery without angina pectoris: Secondary | ICD-10-CM | POA: Diagnosis not present

## 2023-05-04 LAB — ECHOCARDIOGRAM COMPLETE
AR max vel: 1.84 cm2
AV Area VTI: 2.22 cm2
AV Area mean vel: 2.21 cm2
AV Mean grad: 2 mmHg
AV Peak grad: 4.5 mmHg
Ao pk vel: 1.06 m/s
Area-P 1/2: 3.28 cm2
MV VTI: 2.1 cm2
S' Lateral: 2.8 cm

## 2023-05-04 NOTE — Progress Notes (Signed)
*  PRELIMINARY RESULTS* Echocardiogram 2D Echocardiogram has been performed.  Carolyne Fiscal 05/04/2023, 10:01 AM

## 2023-05-04 NOTE — Addendum Note (Signed)
Encounter addended by: Carolyne Fiscal on: 05/04/2023 10:01 AM  Actions taken: Imaging Exam ended, Clinical Note Signed

## 2023-05-04 NOTE — Addendum Note (Signed)
Encounter addended by: Carolyne Fiscal on: 05/04/2023 9:17 AM  Actions taken: Imaging Exam begun

## 2023-05-06 ENCOUNTER — Other Ambulatory Visit: Payer: Self-pay | Admitting: Family

## 2023-05-06 ENCOUNTER — Ambulatory Visit
Admission: RE | Admit: 2023-05-06 | Discharge: 2023-05-06 | Disposition: A | Discharge status: Home / Self Care | Payer: No Typology Code available for payment source | Source: Ambulatory Visit | Attending: Family | Admitting: Family

## 2023-05-06 DIAGNOSIS — R42 Dizziness and giddiness: Secondary | ICD-10-CM | POA: Insufficient documentation

## 2023-05-06 DIAGNOSIS — I6523 Occlusion and stenosis of bilateral carotid arteries: Secondary | ICD-10-CM

## 2023-05-06 DIAGNOSIS — G319 Degenerative disease of nervous system, unspecified: Secondary | ICD-10-CM | POA: Diagnosis not present

## 2023-05-06 DIAGNOSIS — H9190 Unspecified hearing loss, unspecified ear: Secondary | ICD-10-CM | POA: Diagnosis not present

## 2023-05-06 DIAGNOSIS — I6782 Cerebral ischemia: Secondary | ICD-10-CM | POA: Diagnosis not present

## 2023-05-06 MED ORDER — GADOBUTROL 1 MMOL/ML IV SOLN
6.0000 mL | Freq: Once | INTRAVENOUS | Status: AC | PRN
  Administered 2023-05-06: 6 mL via INTRAVENOUS

## 2023-05-10 ENCOUNTER — Encounter: Payer: Self-pay | Admitting: Family

## 2023-05-10 DIAGNOSIS — I6782 Cerebral ischemia: Secondary | ICD-10-CM | POA: Insufficient documentation

## 2023-05-13 ENCOUNTER — Encounter (INDEPENDENT_AMBULATORY_CARE_PROVIDER_SITE_OTHER): Payer: Self-pay

## 2023-05-15 ENCOUNTER — Telehealth: Payer: Self-pay | Admitting: *Deleted

## 2023-05-15 DIAGNOSIS — J0100 Acute maxillary sinusitis, unspecified: Secondary | ICD-10-CM

## 2023-05-15 NOTE — Telephone Encounter (Signed)
Message below read to patient after she mentioned she had not received [place] or MRI results:    1)Wanda Clayton,   Ultrasound carotid artery shows moderate atherosclerotic plaque more so left than right.  There is also an abnormal flow in the left vertebral artery.   I would recommend consult with vascular surgery for further evaluation.  If agreeable, I have placed this referral.  Please let me know if you not hear from our office in the next couple weeks with an appointment   Regards, Margaret  Written by Allegra Grana, FNP on 05/06/2023  1:29 PM EDT Seen by patient [name] on 05/06/2023  1:31 PM   Pt agreeable to referral & aware that referral has been placed.     2)Wanda Clayton,   Overall quite pleased with MRI of the brain.  Evidence of mild generalized cerebral atrophy as well as chronic small vessel ischemic disease with evidence of progression since 2019.  Please continue atorvastatin 40 mg and aspirin 81 mg daily . you do not have an auditory canal mass.  No acute infarct (stroke).   There is evidence of mucosal thickening on the right maxillary sinus.  Are you having sinus pain or congestion?  If you are having any sinus symptoms, I would recommend an antibiotic.   Please let me know how you  are feeling. Regards, Margaret  Written by Allegra Grana, FNP on 05/10/2023  7:11 AM EDT   Pt aware of results & states that she does have a lot of congestion. She uses [facility] if you send in a antibiotic. Pt also aware that you are out of the office today (05/15/23)

## 2023-05-16 ENCOUNTER — Encounter: Payer: Self-pay | Admitting: Family

## 2023-05-16 MED ORDER — DOXYCYCLINE HYCLATE 100 MG PO TABS: 100.0000 mg | ORAL_TABLET | Freq: Two times a day (BID) | ORAL | 0 refills | Status: AC

## 2023-05-16 NOTE — Telephone Encounter (Signed)
Noted Sent in doxycycline for pt  Sent her a mychart note as wel

## 2023-05-20 DIAGNOSIS — E279 Disorder of adrenal gland, unspecified: Secondary | ICD-10-CM | POA: Diagnosis not present

## 2023-05-27 ENCOUNTER — Ambulatory Visit (INDEPENDENT_AMBULATORY_CARE_PROVIDER_SITE_OTHER): Payer: No Typology Code available for payment source | Admitting: Family

## 2023-05-27 ENCOUNTER — Other Ambulatory Visit: Payer: Self-pay

## 2023-05-27 ENCOUNTER — Encounter: Payer: Self-pay | Admitting: Family

## 2023-05-27 VITALS — BP 118/76 | HR 105 | Temp 98.4°F | Ht 63.0 in | Wt 130.8 lb

## 2023-05-27 DIAGNOSIS — I2510 Atherosclerotic heart disease of native coronary artery without angina pectoris: Secondary | ICD-10-CM

## 2023-05-27 DIAGNOSIS — R718 Other abnormality of red blood cells: Secondary | ICD-10-CM | POA: Diagnosis not present

## 2023-05-27 DIAGNOSIS — Q891 Congenital malformations of adrenal gland: Secondary | ICD-10-CM

## 2023-05-27 DIAGNOSIS — R7989 Other specified abnormal findings of blood chemistry: Secondary | ICD-10-CM | POA: Diagnosis not present

## 2023-05-27 DIAGNOSIS — R42 Dizziness and giddiness: Secondary | ICD-10-CM | POA: Diagnosis not present

## 2023-05-27 DIAGNOSIS — R899 Unspecified abnormal finding in specimens from other organs, systems and tissues: Secondary | ICD-10-CM

## 2023-05-27 DIAGNOSIS — I6523 Occlusion and stenosis of bilateral carotid arteries: Secondary | ICD-10-CM

## 2023-05-27 MED ORDER — EZETIMIBE 10 MG PO TABS
10.0000 mg | ORAL_TABLET | Freq: Every day | ORAL | 3 refills | Status: DC
Start: 2023-05-27 — End: 2024-06-20
  Filled 2023-05-27: qty 90, 90d supply, fill #0
  Filled 2023-09-28: qty 90, 90d supply, fill #1
  Filled 2023-12-28: qty 90, 90d supply, fill #2
  Filled 2024-03-24: qty 90, 90d supply, fill #3

## 2023-05-27 NOTE — Assessment & Plan Note (Signed)
Improved overall.  Patient is not particular bothered by vertigo at this time.  Reviewed MRI brain, IAC.  Patient will trial meclizine if needed.  She will consider vestibular rehab in the future if needed.  She will let me know how she is doing

## 2023-05-27 NOTE — Progress Notes (Signed)
Pt Bp was taking in left arm and was 116/76 O2 was 97 Hr was 102 Bp was taken in right arm and was 114/74 O2 was 96 Hr was 100

## 2023-05-27 NOTE — Assessment & Plan Note (Signed)
Pending vascular consult.  Blood pressure between left and right arm does not show significant difference to suggest subclavian steal.

## 2023-05-27 NOTE — Assessment & Plan Note (Signed)
Planned to repeat CBC with differential today.  Patient politely declines at this time.  We agreed to order labs at follow-up.  Concern for  B12 deficiency as possible etiology.  Labs have been ordered ahead of follow-up appointment

## 2023-05-27 NOTE — Progress Notes (Signed)
Assessment & Plan:  Adrenal gland anomaly  Coronary artery calcification seen on CT scan -     Ezetimibe; Take 1 tablet (10 mg total) by mouth daily.  Dispense: 90 tablet; Refill: 3  Abnormal laboratory test -     CBC with Differential/Platelet; Future -     Celiac Disease Ab Screen w/Rfx; Future -     Hepatic function panel; Future -     Homocysteine; Future -     Intrinsic Factor Antibodies; Future -     Methylmalonic acid, serum; Future  Elevated LFTs -     Hepatic function panel; Future  Vertigo Assessment & Plan: Improved overall.  Patient is not particular bothered by vertigo at this time.  Reviewed MRI brain, IAC.  Patient will trial meclizine if needed.  She will consider vestibular rehab in the future if needed.  She will let me know how she is doing   Elevated MCV Assessment & Plan: Planned to repeat CBC with differential today.  Patient politely declines at this time.  We agreed to order labs at follow-up.  Concern for  B12 deficiency as possible etiology.  Labs have been ordered ahead of follow-up appointment   Carotid atherosclerosis, bilateral Assessment & Plan: Pending vascular consult.  Blood pressure between left and right arm does not show significant difference to suggest subclavian steal.       Return precautions given.   Risks, benefits, and alternatives of the medications and treatment plan prescribed today were discussed, and patient expressed understanding.   Education regarding symptom management and diagnosis given to patient on AVS either electronically or printed.  Return in about 3 months (around 08/27/2023).  Rennie Plowman, FNP  Subjective:    [name], female    DOB: 06-03-1951, 72 y.o.   MRN: [id number]  CC: Wanda Clayton is a 72 y.o. female who presents today for follow up.   HPI: Follow-up vertigo   Overall feels well today.  No new complaints.  She is yet to try meclizine.  For the past couple weeks she  describes less instances of vertigo.  Describes as "room spinning" Trial of meclizine   completed doxycycline for sinus congestion , which has improved.   She is taking B12 1000 mcg daily  B12  315   consult with Dr Standley Brooking for adrenal abnormality 05/20/2023; pending hormone evaluation MRI brain IAC 05/06/2023 without evidence of acute intracranial abnormality, internal auditory canal mass.  Chronic small vessel ischemic changes, moderate for age and progressed from prior.  Mild generalized cerebral atrophy, paranasal sinus   She is compliant with pravastatin 40 mg, aspirin 81 mg daily  Echocardiogram 05/04/2023 normal ejection fraction, no significant valvular disease  Allergies: Pollen extract Current Outpatient Medications on File Prior to Visit  Medication Sig Dispense Refill   aspirin 81 MG tablet Take 81 mg by mouth daily.     atorvastatin (LIPITOR) 40 MG tablet TAKE 1 TABLET BY MOUTH DAILY AT 6 PM. 90 tablet 3   meclizine (ANTIVERT) 12.5 MG tablet Take 1 tablet (12.5 mg total) by mouth 3 (three) times daily as needed (vertigo). 30 tablet 0   Multiple Vitamins-Minerals (EQ MULTIVITAMINS ADULT GUMMY PO) Take by mouth daily.     No current facility-administered medications on file prior to visit.    Review of Systems  Constitutional:  Negative for chills and fever.  Respiratory:  Negative for cough.   Cardiovascular:  Negative for chest pain and palpitations.  Gastrointestinal:  Negative for nausea and vomiting.  Neurological:  Negative for dizziness and headaches.      Objective:    BP 118/76   Pulse 99   Temp 98.4 F (36.9 C) (Oral)   Ht 5\' 3"  (1.6 m)   Wt 130 lb 12.8 oz (59.3 kg)   SpO2 98%   BMI 23.17 kg/m  BP Readings from Last 3 Encounters:  05/27/23 118/76  04/15/23 110/70  04/01/23 102/70   Wt Readings from Last 3 Encounters:  05/27/23 130 lb 12.8 oz (59.3 kg)  04/15/23 134 lb 9.6 oz (61.1 kg)  04/01/23 133 lb 3.2 oz (60.4 kg)    Physical Exam Vitals  reviewed.  Constitutional:      Appearance: She is well-developed.  Eyes:     Conjunctiva/sclera: Conjunctivae normal.  Cardiovascular:     Rate and Rhythm: Normal rate and regular rhythm.     Pulses: Normal pulses.     Heart sounds: Normal heart sounds.  Pulmonary:     Effort: Pulmonary effort is normal.     Breath sounds: Normal breath sounds. No wheezing, rhonchi or rales.  Skin:    General: Skin is warm and dry.  Neurological:     Mental Status: She is alert.  Psychiatric:        Speech: Speech normal.        Behavior: Behavior normal.        Thought Content: Thought content normal.

## 2023-05-29 ENCOUNTER — Other Ambulatory Visit: Payer: Self-pay

## 2023-06-04 DIAGNOSIS — F1020 Alcohol dependence, uncomplicated: Secondary | ICD-10-CM | POA: Diagnosis not present

## 2023-06-04 DIAGNOSIS — F17210 Nicotine dependence, cigarettes, uncomplicated: Secondary | ICD-10-CM | POA: Diagnosis not present

## 2023-06-04 DIAGNOSIS — R2681 Unsteadiness on feet: Secondary | ICD-10-CM | POA: Diagnosis not present

## 2023-06-04 DIAGNOSIS — I739 Peripheral vascular disease, unspecified: Secondary | ICD-10-CM | POA: Diagnosis not present

## 2023-06-04 DIAGNOSIS — K5190 Ulcerative colitis, unspecified, without complications: Secondary | ICD-10-CM | POA: Diagnosis not present

## 2023-06-04 DIAGNOSIS — J439 Emphysema, unspecified: Secondary | ICD-10-CM | POA: Diagnosis not present

## 2023-06-04 DIAGNOSIS — R002 Palpitations: Secondary | ICD-10-CM | POA: Diagnosis not present

## 2023-06-04 DIAGNOSIS — F325 Major depressive disorder, single episode, in full remission: Secondary | ICD-10-CM | POA: Diagnosis not present

## 2023-06-04 DIAGNOSIS — Z6823 Body mass index (BMI) 23.0-23.9, adult: Secondary | ICD-10-CM | POA: Diagnosis not present

## 2023-06-04 DIAGNOSIS — Z008 Encounter for other general examination: Secondary | ICD-10-CM | POA: Diagnosis not present

## 2023-06-04 DIAGNOSIS — E785 Hyperlipidemia, unspecified: Secondary | ICD-10-CM | POA: Diagnosis not present

## 2023-06-04 DIAGNOSIS — I700 Atherosclerosis of aorta: Secondary | ICD-10-CM | POA: Diagnosis not present

## 2023-07-02 ENCOUNTER — Encounter (INDEPENDENT_AMBULATORY_CARE_PROVIDER_SITE_OTHER): Payer: Self-pay | Admitting: Nurse Practitioner

## 2023-07-02 ENCOUNTER — Ambulatory Visit (INDEPENDENT_AMBULATORY_CARE_PROVIDER_SITE_OTHER): Payer: No Typology Code available for payment source | Admitting: Nurse Practitioner

## 2023-07-02 VITALS — BP 159/66 | HR 106 | Resp 16 | Wt 132.8 lb

## 2023-07-02 DIAGNOSIS — I739 Peripheral vascular disease, unspecified: Secondary | ICD-10-CM | POA: Diagnosis not present

## 2023-07-02 DIAGNOSIS — I10 Essential (primary) hypertension: Secondary | ICD-10-CM

## 2023-07-02 DIAGNOSIS — E782 Mixed hyperlipidemia: Secondary | ICD-10-CM

## 2023-07-02 DIAGNOSIS — G458 Other transient cerebral ischemic attacks and related syndromes: Secondary | ICD-10-CM

## 2023-07-02 DIAGNOSIS — I70213 Atherosclerosis of native arteries of extremities with intermittent claudication, bilateral legs: Secondary | ICD-10-CM

## 2023-07-02 NOTE — Progress Notes (Incomplete)
Subjective:    [name], female    DOB: Sep 28, 1951, 72 y.o.   MRN: [id number] Chief Complaint  Patient presents with  . New Patient (Initial Visit)    Ref Arnett consult retrograde flow within the left vertebral artery, abnormal carotid u/s     HPI  Review of Systems     Objective:   Physical Exam  BP (!) 159/66 (BP Location: Right Arm)   Pulse (!) 106   Resp 16   Wt 132 lb 12.8 oz (60.2 kg)   BMI 23.52 kg/m   Past Medical History:  Diagnosis Date  . Acute pain of left foot 02/08/2020  . Anxiety   . Depression   . Elevated liver enzymes 11/28/2020  . Hypertension     Social History   Socioeconomic History  . Marital status: Married    Spouse name: Not on file  . Number of children: 1  . Years of education: Not on file  . Highest education level: Not on file  Occupational History    Employer: [employer]  Tobacco Use  . Smoking status: Every Day    Current packs/day: 0.50    Average packs/day: 0.5 packs/day for 36.0 years (18.0 ttl pk-yrs)    Types: Cigarettes  . Smokeless tobacco: Never  . Tobacco comments:    she plans to quit without medication  Vaping Use  . Vaping status: Never Used  Substance and Sexual Activity  . Alcohol use: Yes    Comment: Daily glass wine x2  . Drug use: No  . Sexual activity: Not Currently    Birth control/protection: None, Post-menopausal  Other Topics Concern  . Not on file  Social History Narrative   Works for [employer], retired 06/2016   Married.   Daughter and granddaughter.          Social Determinants of Health   Financial Resource Strain: Low Risk  (03/24/2023)   Overall Financial Resource Strain (CARDIA)   . Difficulty of Paying Living Expenses: Not hard at all  Food Insecurity: No Food Insecurity (03/24/2023)   Hunger Vital Sign   . Worried About Programme researcher, broadcasting/film/video in the Last Year: Never true   . Ran Out of Food in the Last Year: Never true  Transportation Needs: No  Transportation Needs (03/24/2023)   PRAPARE - Transportation   . Lack of Transportation (Medical): No   . Lack of Transportation (Non-Medical): No  Physical Activity: Insufficiently Active (03/24/2023)   Exercise Vital Sign   . Days of Exercise per Week: 2 days   . Minutes of Exercise per Session: 20 min  Stress: No Stress Concern Present (03/24/2023)   [employer] of Occupational Health - Occupational Stress Questionnaire   . Feeling of Stress : Only a little  Social Connections: Moderately Integrated (03/24/2023)   Social Connection and Isolation Panel [NHANES]   . Frequency of Communication with Friends and Family: More than three times a week   . Frequency of Social Gatherings with Friends and Family: Not on file   . Attends Religious Services: More than 4 times per year   . Active Member of Clubs or Organizations: No   . Attends Banker Meetings: Never   . Marital Status: Married  Catering manager Violence: Not At Risk (03/24/2023)   Humiliation, Afraid, Rape, and Kick questionnaire   . Fear of Current or Ex-Partner: No   . Emotionally Abused: No   . Physically Abused: No   .  Sexually Abused: No    Past Surgical History:  Procedure Laterality Date  . BUNIONECTOMY Bilateral   . carpal tunnel repair    . COLONOSCOPY WITH PROPOFOL N/A 08/19/2021   Procedure: COLONOSCOPY WITH PROPOFOL;  Surgeon: Toney Reil, MD;  Location: [facility] ENDOSCOPY;  Service: Gastroenterology;  Laterality: N/A;  . DENTAL SURGERY    . FOOT SURGERY Right   . TUBAL LIGATION    . VAGINAL DELIVERY     x1    Family History  Problem Relation Age of Onset  . Hypertension Mother   . Colon cancer Mother 33  . Atrial fibrillation Mother   . Dementia Mother   . Heart disease Father   . Diabetes Father   . Colon cancer Father 82  . Congestive Heart Failure Father   . COPD Sister   . Crohn's disease Sister   . Colon cancer Sister 20  . Heart attack Sister   . Heart disease Brother    . Breast cancer Neg Hx     Allergies  Allergen Reactions  . Pollen Extract Itching       Latest Ref Rng & Units 04/28/2023    8:43 AM 04/15/2023   11:24 AM 07/08/2022    9:05 AM  CBC  WBC 4.0 - 10.5 K/uL 8.6  8.4  7.4   Hemoglobin 12.0 - 15.0 g/dL 95.6  21.3  08.6   Hematocrit 36.0 - 46.0 % 43.9  41.6  41.4   Platelets 150.0 - 400.0 K/uL 267.0  263.0  323       CMP     Component Value Date/Time   NA 138 04/15/2023 1124   NA 140 04/19/2015 0000   K 4.0 04/15/2023 1124   CL 102 04/15/2023 1124   CO2 28 04/15/2023 1124   GLUCOSE 90 04/15/2023 1124   BUN 10 04/15/2023 1124   BUN 14 04/19/2015 0000   CREATININE 0.86 04/15/2023 1124   CALCIUM 8.8 04/15/2023 1124   PROT 6.8 04/28/2023 0843   PROT 7.1 02/24/2023 0919   ALBUMIN 3.9 04/28/2023 0843   ALBUMIN 4.4 02/24/2023 0919   AST 38 (H) 04/28/2023 0843   ALT 30 04/28/2023 0843   ALKPHOS 105 04/28/2023 0843   BILITOT 0.5 04/28/2023 0843   BILITOT 0.5 02/24/2023 0919   GFR 67.69 04/15/2023 1124     No results found.     Assessment & Plan:   1. Subclavian steal syndrome ***  2. Primary hypertension ***  3. Atherosclerosis of native artery of both lower extremities with intermittent claudication (HCC) ***  4. Mixed hyperlipidemia ***   Current Outpatient Medications on File Prior to Visit  Medication Sig Dispense Refill  . aspirin 81 MG tablet Take 81 mg by mouth daily.    Marland Kitchen atorvastatin (LIPITOR) 40 MG tablet TAKE 1 TABLET BY MOUTH DAILY AT 6 PM. 90 tablet 3  . ezetimibe (ZETIA) 10 MG tablet Take 1 tablet (10 mg total) by mouth daily. 90 tablet 3  . meclizine (ANTIVERT) 12.5 MG tablet Take 1 tablet (12.5 mg total) by mouth 3 (three) times daily as needed (vertigo). 30 tablet 0  . Multiple Vitamins-Minerals (EQ MULTIVITAMINS ADULT GUMMY PO) Take by mouth daily.     No current facility-administered medications on file prior to visit.    There are no Patient Instructions on file for this visit. No  follow-ups on file.   Georgiana Spinner, NP

## 2023-07-02 NOTE — Progress Notes (Signed)
Subjective:    [name], female    DOB: [birth year], 72 y.o.   MRN: [id number] Chief Complaint  Patient presents with   New Patient (Initial Visit)    Ref Arnett consult retrograde flow within the left vertebral artery, abnormal carotid u/s     W. Manner is a 72 year old female who presents today for evaluation due to an abnormal carotid duplex.  It was noted that in her left vertebral she had retrograde flow.  Additionally the patient has been experiencing dizziness and vertigo symptoms.  She notes that these can occur at random times and there is no predictable pattern.  Sometimes happens when she is laying in bed but it is not always associated with activity or movement.  She does endorse that sometimes she has some numb feeling in her left arm but that is not consistent.  The larger concern to the patient is the fact that she does not have palpable pulses in her bilateral lower extremities.  She does endorse claudication-like symptoms that occurred in her calves and her thighs.  She is describing what may be mild rest pain symptoms with cramping in her legs that happened during the evening.  However this is not consistently happening every evening.  She is also concerned that her left leg the color is abnormal and the foot is darker but she has notable varicosities in her left lower extremity and much of the dark areas are actually groupings of superficial spider varicosities.  Currently she has no open wounds or ulcerations.  She denies any TIA or almost a few just like symptoms or CVA-like symptoms.  Additionally the patient is a current smoker and she has a family history of peripheral arterial disease    Review of Systems  Musculoskeletal:  Positive for back pain and gait problem.  Neurological:  Positive for dizziness.  All other systems reviewed and are negative.      Objective:   Physical Exam Vitals reviewed.  HENT:     Head: Normocephalic.  Cardiovascular:      Rate and Rhythm: Normal rate.     Pulses:          Radial pulses are 2+ on the right side.       Femoral pulses are 1+ on the left side.      Dorsalis pedis pulses are 0 on the right side and 0 on the left side.       Posterior tibial pulses are 0 on the right side and 0 on the left side.  Pulmonary:     Effort: Pulmonary effort is normal.  Skin:    General: Skin is warm and dry.  Neurological:     Mental Status: She is alert and oriented to person, place, and time.  Psychiatric:        Mood and Affect: Mood normal.        Behavior: Behavior normal.        Thought Content: Thought content normal.        Judgment: Judgment normal.     BP (!) 159/66 (BP Location: Right Arm)   Pulse (!) 106   Resp 16   Wt 132 lb 12.8 oz (60.2 kg)   BMI 23.52 kg/m   Past Medical History:  Diagnosis Date   Acute pain of left foot 02/08/2020   Anxiety    Depression    Elevated liver enzymes 11/28/2020   Hypertension     Social History   Socioeconomic  History   Marital status: Married    Spouse name: Not on file   Number of children: 1   Years of education: Not on file   Highest education level: Not on file  Occupational History    Employer: [employer]  Tobacco Use   Smoking status: Every Day    Current packs/day: 0.50    Average packs/day: 0.5 packs/day for 36.0 years (18.0 ttl pk-yrs)    Types: Cigarettes   Smokeless tobacco: Never   Tobacco comments:    she plans to quit without medication  Vaping Use   Vaping status: Never Used  Substance and Sexual Activity   Alcohol use: Yes    Comment: Daily glass wine x2   Drug use: No   Sexual activity: Not Currently    Birth control/protection: None, Post-menopausal  Other Topics Concern   Not on file  Social History Narrative   Works for [employer], retired 06/2016   Married.   Daughter and granddaughter.          Social Determinants of Health   Financial Resource Strain: Low Risk  (03/24/2023)   Overall Financial  Resource Strain (CARDIA)    Difficulty of Paying Living Expenses: Not hard at all  Food Insecurity: No Food Insecurity (03/24/2023)   Hunger Vital Sign    Worried About Running Out of Food in the Last Year: Never true    Ran Out of Food in the Last Year: Never true  Transportation Needs: No Transportation Needs (03/24/2023)   PRAPARE - Administrator, Civil Service (Medical): No    Lack of Transportation (Non-Medical): No  Physical Activity: Insufficiently Active (03/24/2023)   Exercise Vital Sign    Days of Exercise per Week: 2 days    Minutes of Exercise per Session: 20 min  Stress: No Stress Concern Present (03/24/2023)   [employer] of Occupational Health - Occupational Stress Questionnaire    Feeling of Stress : Only a little  Social Connections: Moderately Integrated (03/24/2023)   Social Connection and Isolation Panel [NHANES]    Frequency of Communication with Friends and Family: More than three times a week    Frequency of Social Gatherings with Friends and Family: Not on file    Attends Religious Services: More than 4 times per year    Active Member of [employer] or Organizations: No    Attends Banker Meetings: Never    Marital Status: Married  Catering manager Violence: Not At Risk (03/24/2023)   Humiliation, Afraid, Rape, and Kick questionnaire    Fear of Current or Ex-Partner: No    Emotionally Abused: No    Physically Abused: No    Sexually Abused: No    Past Surgical History:  Procedure Laterality Date   BUNIONECTOMY Bilateral    carpal tunnel repair     COLONOSCOPY WITH PROPOFOL N/A 08/19/2021   Procedure: COLONOSCOPY WITH PROPOFOL;  Surgeon: Toney Reil, MD;  Location: [facility] ENDOSCOPY;  Service: Gastroenterology;  Laterality: N/A;   DENTAL SURGERY     FOOT SURGERY Right    TUBAL LIGATION     VAGINAL DELIVERY     x1    Family History  Problem Relation Age of Onset   Hypertension Mother    Colon cancer Mother 2   Atrial  fibrillation Mother    Dementia Mother    Heart disease Father    Diabetes Father    Colon cancer Father 58   Congestive Heart Failure Father  COPD Sister    Crohn's disease Sister    Colon cancer Sister 40   Heart attack Sister    Heart disease Brother    Breast cancer Neg Hx     Allergies  Allergen Reactions   Pollen Extract Itching       Latest Ref Rng & Units 04/28/2023    8:43 AM 04/15/2023   11:24 AM 07/08/2022    9:05 AM  CBC  WBC 4.0 - 10.5 K/uL 8.6  8.4  7.4   Hemoglobin 12.0 - 15.0 g/dL 40.9  81.1  91.4   Hematocrit 36.0 - 46.0 % 43.9  41.6  41.4   Platelets 150.0 - 400.0 K/uL 267.0  263.0  323       CMP     Component Value Date/Time   NA 138 04/15/2023 1124   NA 140 04/19/2015 0000   K 4.0 04/15/2023 1124   CL 102 04/15/2023 1124   CO2 28 04/15/2023 1124   GLUCOSE 90 04/15/2023 1124   BUN 10 04/15/2023 1124   BUN 14 04/19/2015 0000   CREATININE 0.86 04/15/2023 1124   CALCIUM 8.8 04/15/2023 1124   PROT 6.8 04/28/2023 0843   PROT 7.1 02/24/2023 0919   ALBUMIN 3.9 04/28/2023 0843   ALBUMIN 4.4 02/24/2023 0919   AST 38 (H) 04/28/2023 0843   ALT 30 04/28/2023 0843   ALKPHOS 105 04/28/2023 0843   BILITOT 0.5 04/28/2023 0843   BILITOT 0.5 02/24/2023 0919   GFR 67.69 04/15/2023 1124     No results found.     Assessment & Plan:   1. Subclavian steal syndrome Based upon the patient's description I do suspect that some of her issues are related to subclavian steal syndrome however some of the dizziness that she experiences is at rest and with no activity and so there certainly could be a vertigo component to this as well.  I discussed treatment for subclavian artery stenosis with the patient and she was very anxious at the possibility of invasive intervention.  Currently she is medically managed well with aspirin and statin.  I discussed that she could follow-up with her PCP for further evaluation as to causes of her vertigo but if they are ultimately  not able to uncover or treat any symptoms that give her significant improvement with, we we will likely need to plan on having another discussion regarding treatment of the stenosis of the left subclavian artery.  2. Primary hypertension Continue antihypertensive medications as already ordered, these medications have been reviewed and there are no changes at this time.  The patient does have abnormal blood pressures in her arms.  This is due to the subclavian artery stenosis.  The patient's accurate blood pressures will be obtained on her right arm  3. Mixed hyperlipidemia Continue statin as ordered and reviewed, no changes at this time  4. Claudication [facility]) The patient's larger concern today is about her lack of pulses or discoloration in her feet and her leg pain.  Given the patient's smoking history and her lack of palpable pulses I suspect that she may have as far as the foot discoloration that is largely some venous insufficiency.  Will have the patient return with ABIs as well as aortic iliac duplex as some of her symptoms may be suggestive of possible iliac level disease.     Current Outpatient Medications on File Prior to Visit  Medication Sig Dispense Refill   aspirin 81 MG tablet Take 81 mg by mouth daily.  atorvastatin (LIPITOR) 40 MG tablet TAKE 1 TABLET BY MOUTH DAILY AT 6 PM. 90 tablet 3   ezetimibe (ZETIA) 10 MG tablet Take 1 tablet (10 mg total) by mouth daily. 90 tablet 3   meclizine (ANTIVERT) 12.5 MG tablet Take 1 tablet (12.5 mg total) by mouth 3 (three) times daily as needed (vertigo). 30 tablet 0   Multiple Vitamins-Minerals (EQ MULTIVITAMINS ADULT GUMMY PO) Take by mouth daily.     No current facility-administered medications on file prior to visit.    There are no Patient Instructions on file for this visit. No follow-ups on file.   Georgiana Spinner, NP

## 2023-08-04 ENCOUNTER — Other Ambulatory Visit (INDEPENDENT_AMBULATORY_CARE_PROVIDER_SITE_OTHER): Payer: Self-pay | Admitting: Nurse Practitioner

## 2023-08-04 DIAGNOSIS — I739 Peripheral vascular disease, unspecified: Secondary | ICD-10-CM

## 2023-08-05 ENCOUNTER — Telehealth (INDEPENDENT_AMBULATORY_CARE_PROVIDER_SITE_OTHER): Payer: Self-pay

## 2023-08-05 ENCOUNTER — Encounter (INDEPENDENT_AMBULATORY_CARE_PROVIDER_SITE_OTHER): Payer: Self-pay | Admitting: Nurse Practitioner

## 2023-08-05 ENCOUNTER — Ambulatory Visit (INDEPENDENT_AMBULATORY_CARE_PROVIDER_SITE_OTHER): Payer: No Typology Code available for payment source | Admitting: Nurse Practitioner

## 2023-08-05 ENCOUNTER — Ambulatory Visit (INDEPENDENT_AMBULATORY_CARE_PROVIDER_SITE_OTHER): Payer: No Typology Code available for payment source

## 2023-08-05 VITALS — BP 174/78 | HR 89 | Resp 16 | Wt 132.6 lb

## 2023-08-05 DIAGNOSIS — I70223 Atherosclerosis of native arteries of extremities with rest pain, bilateral legs: Secondary | ICD-10-CM | POA: Diagnosis not present

## 2023-08-05 DIAGNOSIS — G458 Other transient cerebral ischemic attacks and related syndromes: Secondary | ICD-10-CM

## 2023-08-05 DIAGNOSIS — I739 Peripheral vascular disease, unspecified: Secondary | ICD-10-CM

## 2023-08-05 DIAGNOSIS — I10 Essential (primary) hypertension: Secondary | ICD-10-CM | POA: Diagnosis not present

## 2023-08-05 DIAGNOSIS — E782 Mixed hyperlipidemia: Secondary | ICD-10-CM

## 2023-08-05 NOTE — Telephone Encounter (Signed)
Spoke with the patient and she is scheduled with Dr. Gilda Crease on 08/18/23 (RLE- 6:45 am) and 08/25/23 (LLE - 8:00 am) at the [facility]. Pre-procedure instructions were discussed and will be sent to Mychart and mailed.

## 2023-08-10 LAB — VAS US ABI WITH/WO TBI
Left ABI: 0.59
Right ABI: 0.42

## 2023-08-10 NOTE — H&P (View-Only) (Signed)
 Subjective:    [name], female    DOB: 12-Jan-1951, 72 y.o.   MRN: [id number] Chief Complaint  Patient presents with   Follow-up    Ultrasound follow up    Wanda Clayton is a 72 year old female who presents today regarding pain in her lower extremities.  She was previously seen for chronic disease and was revealed to have some subclavian stenosis.  At the time while the patient does have some symptom she noted that they were not sick.  He had blood she was more so concerned about her lower extremity issues.The larger concern to the patient is the fact that she does not have palpable pulses in her bilateral lower extremities.  She does endorse claudication-like symptoms that occurred in her calves and her thighs.  She is describing what may be mild rest pain symptoms with cramping in her legs that happened during the evening.  Currently there are no open or ulcerations.  The patient is a current smoker.  Today noninvasive studies show an ABI 0.42 on the right and 0.59 on the left.  The right has a TBI of 0.38 and a TBI of 0.29 on the left.  She has monophasic tibial artery waveforms bilaterally.    Review of Systems  Cardiovascular:        Claudication with mild rest pain  All other systems reviewed and are negative.      Objective:   Physical Exam Vitals reviewed.  HENT:     Head: Normocephalic.  Cardiovascular:     Rate and Rhythm: Normal rate.     Pulses:          Dorsalis pedis pulses are detected w/ Doppler on the right side and detected w/ Doppler on the left side.       Posterior tibial pulses are detected w/ Doppler on the right side and detected w/ Doppler on the left side.  Pulmonary:     Effort: Pulmonary effort is normal.  Skin:    General: Skin is warm and dry.  Neurological:     Mental Status: She is alert and oriented to person, place, and time.  Psychiatric:        Mood and Affect: Mood normal.        Behavior: Behavior normal.        Thought  Content: Thought content normal.        Judgment: Judgment normal.     BP (!) 174/78 (BP Location: Right Arm)   Pulse 89   Resp 16   Wt 132 lb 9.6 oz (60.1 kg)   BMI 23.49 kg/m   Past Medical History:  Diagnosis Date   Acute pain of left foot 02/08/2020   Anxiety    Depression    Elevated liver enzymes 11/28/2020   Hypertension     Social History   Socioeconomic History   Marital status: Married    Spouse name: Not on file   Number of children: 1   Years of education: Not on file   Highest education level: Not on file  Occupational History    Employer: [employer]  Tobacco Use   Smoking status: Every Day    Current packs/day: 0.50    Average packs/day: 0.5 packs/day for 36.0 years (18.0 ttl pk-yrs)    Types: Cigarettes   Smokeless tobacco: Never   Tobacco comments:    she plans to quit without medication  Vaping Use   Vaping status: Never Used  Substance  and Sexual Activity   Alcohol use: Yes    Comment: Daily glass wine x2   Drug use: No   Sexual activity: Not Currently    Birth control/protection: None, Post-menopausal  Other Topics Concern   Not on file  Social History Narrative   Works for [employer], retired 06/2016   Married.   Daughter and granddaughter.          Social Determinants of Health   Financial Resource Strain: Low Risk  (03/24/2023)   Overall Financial Resource Strain (CARDIA)    Difficulty of Paying Living Expenses: Not hard at all  Food Insecurity: No Food Insecurity (03/24/2023)   Hunger Vital Sign    Worried About Running Out of Food in the Last Year: Never true    Ran Out of Food in the Last Year: Never true  Transportation Needs: No Transportation Needs (03/24/2023)   PRAPARE - Administrator, Civil Service (Medical): No    Lack of Transportation (Non-Medical): No  Physical Activity: Insufficiently Active (03/24/2023)   Exercise Vital Sign    Days of Exercise per Week: 2 days    Minutes of Exercise per Session:  20 min  Stress: No Stress Concern Present (03/24/2023)   [employer] of Occupational Health - Occupational Stress Questionnaire    Feeling of Stress : Only a little  Social Connections: Moderately Integrated (03/24/2023)   Social Connection and Isolation Panel [NHANES]    Frequency of Communication with Friends and Family: More than three times a week    Frequency of Social Gatherings with Friends and Family: Not on file    Attends Religious Services: More than 4 times per year    Active Member of [employer] or Organizations: No    Attends Banker Meetings: Never    Marital Status: Married  Catering manager Violence: Not At Risk (03/24/2023)   Humiliation, Afraid, Rape, and Kick questionnaire    Fear of Current or Ex-Partner: No    Emotionally Abused: No    Physically Abused: No    Sexually Abused: No    Past Surgical History:  Procedure Laterality Date   BUNIONECTOMY Bilateral    carpal tunnel repair     COLONOSCOPY WITH PROPOFOL N/A 08/19/2021   Procedure: COLONOSCOPY WITH PROPOFOL;  Surgeon: Toney Reil, MD;  Location: [facility] ENDOSCOPY;  Service: Gastroenterology;  Laterality: N/A;   DENTAL SURGERY     FOOT SURGERY Right    TUBAL LIGATION     VAGINAL DELIVERY     x1    Family History  Problem Relation Age of Onset   Hypertension Mother    Colon cancer Mother 7   Atrial fibrillation Mother    Dementia Mother    Heart disease Father    Diabetes Father    Colon cancer Father 30   Congestive Heart Failure Father    COPD Sister    Crohn's disease Sister    Colon cancer Sister 1   Heart attack Sister    Heart disease Brother    Breast cancer Neg Hx     Allergies  Allergen Reactions   Pollen Extract Itching       Latest Ref Rng & Units 04/28/2023    8:43 AM 04/15/2023   11:24 AM 07/08/2022    9:05 AM  CBC  WBC 4.0 - 10.5 K/uL 8.6  8.4  7.4   Hemoglobin 12.0 - 15.0 g/dL 32.4  40.1  02.7   Hematocrit 36.0 - 46.0 % 43.9  41.6  41.4   Platelets  150.0 - 400.0 K/uL 267.0  263.0  323       CMP     Component Value Date/Time   NA 138 04/15/2023 1124   NA 140 04/19/2015 0000   K 4.0 04/15/2023 1124   CL 102 04/15/2023 1124   CO2 28 04/15/2023 1124   GLUCOSE 90 04/15/2023 1124   BUN 10 04/15/2023 1124   BUN 14 04/19/2015 0000   CREATININE 0.86 04/15/2023 1124   CALCIUM 8.8 04/15/2023 1124   PROT 6.8 04/28/2023 0843   PROT 7.1 02/24/2023 0919   ALBUMIN 3.9 04/28/2023 0843   ALBUMIN 4.4 02/24/2023 0919   AST 38 (H) 04/28/2023 0843   ALT 30 04/28/2023 0843   ALKPHOS 105 04/28/2023 0843   BILITOT 0.5 04/28/2023 0843   BILITOT 0.5 02/24/2023 0919   GFR 67.69 04/15/2023 1124     No results found.     Assessment & Plan:   1. Atherosclerosis of native artery of both lower extremities with rest pain (HCC) Recommend:  The patient has evidence of severe atherosclerotic changes of both lower extremities with rest pain that is associated with preulcerative changes and impending tissue loss of the bilateral feet.  This represents a limb threatening ischemia and places the patient at the risk for bilateral limb loss.  Patient should undergo angiography of the right lower extremity followed by left lower extremity with the hope for intervention for limb salvage.  The risks and benefits as well as the alternative therapies was discussed in detail with the patient.  All questions were answered.  Patient agrees to proceed with  lower extremity angiography.  The patient will follow up with me in the office after the procedure.      2. Primary hypertension Continue antihypertensive medications as already ordered, these medications have been reviewed and there are no changes at this time.  3. Mixed hyperlipidemia Continue statin as ordered and reviewed, no changes at this time  4. Subclavian steal syndrome Currently this is still not a significant issue for the patient we will continue to monitor.   Current Outpatient  Medications on File Prior to Visit  Medication Sig Dispense Refill   aspirin 81 MG tablet Take 81 mg by mouth daily.     atorvastatin (LIPITOR) 40 MG tablet TAKE 1 TABLET BY MOUTH DAILY AT 6 PM. 90 tablet 3   ezetimibe (ZETIA) 10 MG tablet Take 1 tablet (10 mg total) by mouth daily. 90 tablet 3   meclizine (ANTIVERT) 12.5 MG tablet Take 1 tablet (12.5 mg total) by mouth 3 (three) times daily as needed (vertigo). 30 tablet 0   Multiple Vitamins-Minerals (EQ MULTIVITAMINS ADULT GUMMY PO) Take by mouth daily.     No current facility-administered medications on file prior to visit.    There are no Patient Instructions on file for this visit. No follow-ups on file.   Georgiana Spinner, NP

## 2023-08-10 NOTE — Progress Notes (Signed)
Subjective:    [name], female    DOB: 12-Jan-1951, 72 y.o.   MRN: [id number] Chief Complaint  Patient presents with   Follow-up    Ultrasound follow up    Wanda Clayton is a 72 year old female who presents today regarding pain in her lower extremities.  She was previously seen for chronic disease and was revealed to have some subclavian stenosis.  At the time while the patient does have some symptom she noted that they were not sick.  He had blood she was more so concerned about her lower extremity issues.The larger concern to the patient is the fact that she does not have palpable pulses in her bilateral lower extremities.  She does endorse claudication-like symptoms that occurred in her calves and her thighs.  She is describing what may be mild rest pain symptoms with cramping in her legs that happened during the evening.  Currently there are no open or ulcerations.  The patient is a current smoker.  Today noninvasive studies show an ABI 0.42 on the right and 0.59 on the left.  The right has a TBI of 0.38 and a TBI of 0.29 on the left.  She has monophasic tibial artery waveforms bilaterally.    Review of Systems  Cardiovascular:        Claudication with mild rest pain  All other systems reviewed and are negative.      Objective:   Physical Exam Vitals reviewed.  HENT:     Head: Normocephalic.  Cardiovascular:     Rate and Rhythm: Normal rate.     Pulses:          Dorsalis pedis pulses are detected w/ Doppler on the right side and detected w/ Doppler on the left side.       Posterior tibial pulses are detected w/ Doppler on the right side and detected w/ Doppler on the left side.  Pulmonary:     Effort: Pulmonary effort is normal.  Skin:    General: Skin is warm and dry.  Neurological:     Mental Status: She is alert and oriented to person, place, and time.  Psychiatric:        Mood and Affect: Mood normal.        Behavior: Behavior normal.        Thought  Content: Thought content normal.        Judgment: Judgment normal.     BP (!) 174/78 (BP Location: Right Arm)   Pulse 89   Resp 16   Wt 132 lb 9.6 oz (60.1 kg)   BMI 23.49 kg/m   Past Medical History:  Diagnosis Date   Acute pain of left foot 02/08/2020   Anxiety    Depression    Elevated liver enzymes 11/28/2020   Hypertension     Social History   Socioeconomic History   Marital status: Married    Spouse name: Not on file   Number of children: 1   Years of education: Not on file   Highest education level: Not on file  Occupational History    Employer: [employer]  Tobacco Use   Smoking status: Every Day    Current packs/day: 0.50    Average packs/day: 0.5 packs/day for 36.0 years (18.0 ttl pk-yrs)    Types: Cigarettes   Smokeless tobacco: Never   Tobacco comments:    she plans to quit without medication  Vaping Use   Vaping status: Never Used  Substance  and Sexual Activity   Alcohol use: Yes    Comment: Daily glass wine x2   Drug use: No   Sexual activity: Not Currently    Birth control/protection: None, Post-menopausal  Other Topics Concern   Not on file  Social History Narrative   Works for [employer], retired 06/2016   Married.   Daughter and granddaughter.          Social Determinants of Health   Financial Resource Strain: Low Risk  (03/24/2023)   Overall Financial Resource Strain (CARDIA)    Difficulty of Paying Living Expenses: Not hard at all  Food Insecurity: No Food Insecurity (03/24/2023)   Hunger Vital Sign    Worried About Running Out of Food in the Last Year: Never true    Ran Out of Food in the Last Year: Never true  Transportation Needs: No Transportation Needs (03/24/2023)   PRAPARE - Administrator, Civil Service (Medical): No    Lack of Transportation (Non-Medical): No  Physical Activity: Insufficiently Active (03/24/2023)   Exercise Vital Sign    Days of Exercise per Week: 2 days    Minutes of Exercise per Session:  20 min  Stress: No Stress Concern Present (03/24/2023)   [employer] of Occupational Health - Occupational Stress Questionnaire    Feeling of Stress : Only a little  Social Connections: Moderately Integrated (03/24/2023)   Social Connection and Isolation Panel [NHANES]    Frequency of Communication with Friends and Family: More than three times a week    Frequency of Social Gatherings with Friends and Family: Not on file    Attends Religious Services: More than 4 times per year    Active Member of [employer] or Organizations: No    Attends Banker Meetings: Never    Marital Status: Married  Catering manager Violence: Not At Risk (03/24/2023)   Humiliation, Afraid, Rape, and Kick questionnaire    Fear of Current or Ex-Partner: No    Emotionally Abused: No    Physically Abused: No    Sexually Abused: No    Past Surgical History:  Procedure Laterality Date   BUNIONECTOMY Bilateral    carpal tunnel repair     COLONOSCOPY WITH PROPOFOL N/A 08/19/2021   Procedure: COLONOSCOPY WITH PROPOFOL;  Surgeon: Toney Reil, MD;  Location: [facility] ENDOSCOPY;  Service: Gastroenterology;  Laterality: N/A;   DENTAL SURGERY     FOOT SURGERY Right    TUBAL LIGATION     VAGINAL DELIVERY     x1    Family History  Problem Relation Age of Onset   Hypertension Mother    Colon cancer Mother 7   Atrial fibrillation Mother    Dementia Mother    Heart disease Father    Diabetes Father    Colon cancer Father 30   Congestive Heart Failure Father    COPD Sister    Crohn's disease Sister    Colon cancer Sister 1   Heart attack Sister    Heart disease Brother    Breast cancer Neg Hx     Allergies  Allergen Reactions   Pollen Extract Itching       Latest Ref Rng & Units 04/28/2023    8:43 AM 04/15/2023   11:24 AM 07/08/2022    9:05 AM  CBC  WBC 4.0 - 10.5 K/uL 8.6  8.4  7.4   Hemoglobin 12.0 - 15.0 g/dL 32.4  40.1  02.7   Hematocrit 36.0 - 46.0 % 43.9  41.6  41.4   Platelets  150.0 - 400.0 K/uL 267.0  263.0  323       CMP     Component Value Date/Time   NA 138 04/15/2023 1124   NA 140 04/19/2015 0000   K 4.0 04/15/2023 1124   CL 102 04/15/2023 1124   CO2 28 04/15/2023 1124   GLUCOSE 90 04/15/2023 1124   BUN 10 04/15/2023 1124   BUN 14 04/19/2015 0000   CREATININE 0.86 04/15/2023 1124   CALCIUM 8.8 04/15/2023 1124   PROT 6.8 04/28/2023 0843   PROT 7.1 02/24/2023 0919   ALBUMIN 3.9 04/28/2023 0843   ALBUMIN 4.4 02/24/2023 0919   AST 38 (H) 04/28/2023 0843   ALT 30 04/28/2023 0843   ALKPHOS 105 04/28/2023 0843   BILITOT 0.5 04/28/2023 0843   BILITOT 0.5 02/24/2023 0919   GFR 67.69 04/15/2023 1124     No results found.     Assessment & Plan:   1. Atherosclerosis of native artery of both lower extremities with rest pain (HCC) Recommend:  The patient has evidence of severe atherosclerotic changes of both lower extremities with rest pain that is associated with preulcerative changes and impending tissue loss of the bilateral feet.  This represents a limb threatening ischemia and places the patient at the risk for bilateral limb loss.  Patient should undergo angiography of the right lower extremity followed by left lower extremity with the hope for intervention for limb salvage.  The risks and benefits as well as the alternative therapies was discussed in detail with the patient.  All questions were answered.  Patient agrees to proceed with  lower extremity angiography.  The patient will follow up with me in the office after the procedure.      2. Primary hypertension Continue antihypertensive medications as already ordered, these medications have been reviewed and there are no changes at this time.  3. Mixed hyperlipidemia Continue statin as ordered and reviewed, no changes at this time  4. Subclavian steal syndrome Currently this is still not a significant issue for the patient we will continue to monitor.   Current Outpatient  Medications on File Prior to Visit  Medication Sig Dispense Refill   aspirin 81 MG tablet Take 81 mg by mouth daily.     atorvastatin (LIPITOR) 40 MG tablet TAKE 1 TABLET BY MOUTH DAILY AT 6 PM. 90 tablet 3   ezetimibe (ZETIA) 10 MG tablet Take 1 tablet (10 mg total) by mouth daily. 90 tablet 3   meclizine (ANTIVERT) 12.5 MG tablet Take 1 tablet (12.5 mg total) by mouth 3 (three) times daily as needed (vertigo). 30 tablet 0   Multiple Vitamins-Minerals (EQ MULTIVITAMINS ADULT GUMMY PO) Take by mouth daily.     No current facility-administered medications on file prior to visit.    There are no Patient Instructions on file for this visit. No follow-ups on file.   Georgiana Spinner, NP

## 2023-08-18 ENCOUNTER — Encounter
Admission: RE | Disposition: A | Discharge status: Home / Self Care | Payer: Self-pay | Source: Home / Self Care | Attending: Vascular Surgery

## 2023-08-18 ENCOUNTER — Other Ambulatory Visit: Payer: Self-pay

## 2023-08-18 ENCOUNTER — Encounter: Payer: Self-pay | Admitting: Vascular Surgery

## 2023-08-18 ENCOUNTER — Ambulatory Visit
Admission: RE | Admit: 2023-08-18 | Discharge: 2023-08-18 | Disposition: A | Discharge status: Home / Self Care | Payer: No Typology Code available for payment source | Attending: Vascular Surgery | Admitting: Vascular Surgery

## 2023-08-18 DIAGNOSIS — G458 Other transient cerebral ischemic attacks and related syndromes: Secondary | ICD-10-CM | POA: Diagnosis not present

## 2023-08-18 DIAGNOSIS — F17210 Nicotine dependence, cigarettes, uncomplicated: Secondary | ICD-10-CM | POA: Diagnosis not present

## 2023-08-18 DIAGNOSIS — I10 Essential (primary) hypertension: Secondary | ICD-10-CM | POA: Diagnosis not present

## 2023-08-18 DIAGNOSIS — Z8249 Family history of ischemic heart disease and other diseases of the circulatory system: Secondary | ICD-10-CM | POA: Diagnosis not present

## 2023-08-18 DIAGNOSIS — I70223 Atherosclerosis of native arteries of extremities with rest pain, bilateral legs: Secondary | ICD-10-CM | POA: Diagnosis not present

## 2023-08-18 DIAGNOSIS — I70229 Atherosclerosis of native arteries of extremities with rest pain, unspecified extremity: Secondary | ICD-10-CM

## 2023-08-18 DIAGNOSIS — E782 Mixed hyperlipidemia: Secondary | ICD-10-CM | POA: Diagnosis not present

## 2023-08-18 HISTORY — PX: LOWER EXTREMITY ANGIOGRAPHY: CATH118251

## 2023-08-18 LAB — BUN: BUN: 13 mg/dL (ref 8–23)

## 2023-08-18 LAB — CREATININE, SERUM
Creatinine, Ser: 0.62 mg/dL (ref 0.44–1.00)
GFR, Estimated: >60 mL/min (ref >60)

## 2023-08-18 SURGERY — LOWER EXTREMITY ANGIOGRAPHY
Anesthesia: Moderate Sedation | Site: Leg Lower | Laterality: Right

## 2023-08-18 MED ORDER — DIPHENHYDRAMINE HCL 50 MG/ML IJ SOLN: 50.0000 mg | Freq: Once | INTRAMUSCULAR | Status: DC | PRN

## 2023-08-18 MED ORDER — HYDROMORPHONE HCL 1 MG/ML IJ SOLN: 1.0000 mg | Freq: Once | INTRAMUSCULAR | Status: DC | PRN

## 2023-08-18 MED ORDER — HEPARIN (PORCINE) IN NACL 2000-0.9 UNIT/L-% IV SOLN
INTRAVENOUS | Status: DC | PRN
  Administered 2023-08-18: 1000 mL

## 2023-08-18 MED ORDER — FENTANYL CITRATE (PF) 100 MCG/2ML IJ SOLN
INTRAMUSCULAR | Status: DC | PRN
  Administered 2023-08-18: 25 ug via INTRAVENOUS
  Administered 2023-08-18: 50 ug via INTRAVENOUS

## 2023-08-18 MED ORDER — IODIXANOL 320 MG/ML IV SOLN
INTRAVENOUS | Status: DC | PRN
  Administered 2023-08-18: 65 mL

## 2023-08-18 MED ORDER — CEFAZOLIN SODIUM-DEXTROSE 2-4 GM/100ML-% IV SOLN
INTRAVENOUS | Status: AC
  Filled 2023-08-18: qty 100

## 2023-08-18 MED ORDER — SODIUM CHLORIDE 0.9% FLUSH: 3.0000 mL | Freq: Two times a day (BID) | INTRAVENOUS | Status: DC

## 2023-08-18 MED ORDER — MIDAZOLAM HCL 2 MG/2ML IJ SOLN
INTRAMUSCULAR | Status: DC | PRN
  Administered 2023-08-18: 2 mg via INTRAVENOUS
  Administered 2023-08-18: 1 mg via INTRAVENOUS

## 2023-08-18 MED ORDER — MIDAZOLAM HCL 2 MG/ML PO SYRP: 8.0000 mg | ORAL_SOLUTION | Freq: Once | ORAL | Status: DC | PRN

## 2023-08-18 MED ORDER — SODIUM CHLORIDE 0.9 % IV SOLN: INTRAVENOUS | Status: DC

## 2023-08-18 MED ORDER — FAMOTIDINE 20 MG PO TABS: 40.0000 mg | ORAL_TABLET | Freq: Once | ORAL | Status: DC | PRN

## 2023-08-18 MED ORDER — SODIUM CHLORIDE 0.9% FLUSH: 3.0000 mL | INTRAVENOUS | Status: DC | PRN

## 2023-08-18 MED ORDER — HEPARIN SODIUM (PORCINE) 1000 UNIT/ML IJ SOLN
INTRAMUSCULAR | Status: DC | PRN
  Administered 2023-08-18: 4000 [IU] via INTRAVENOUS

## 2023-08-18 MED ORDER — CEFAZOLIN SODIUM-DEXTROSE 2-4 GM/100ML-% IV SOLN
2.0000 g | INTRAVENOUS | Status: AC
  Administered 2023-08-18: 2 g via INTRAVENOUS

## 2023-08-18 MED ORDER — OXYCODONE HCL 5 MG PO TABS
5.0000 mg | ORAL_TABLET | ORAL | Status: DC | PRN
Start: 2023-08-18 — End: 2023-08-18

## 2023-08-18 MED ORDER — LABETALOL HCL 5 MG/ML IV SOLN: 10.0000 mg | INTRAVENOUS | Status: DC | PRN

## 2023-08-18 MED ORDER — HYDRALAZINE HCL 20 MG/ML IJ SOLN: 5.0000 mg | INTRAMUSCULAR | Status: DC | PRN

## 2023-08-18 MED ORDER — ONDANSETRON HCL 4 MG/2ML IJ SOLN: 4.0000 mg | Freq: Four times a day (QID) | INTRAMUSCULAR | Status: DC | PRN

## 2023-08-18 MED ORDER — HEPARIN SODIUM (PORCINE) 1000 UNIT/ML IJ SOLN
INTRAMUSCULAR | Status: AC
  Filled 2023-08-18: qty 10

## 2023-08-18 MED ORDER — ACETAMINOPHEN 325 MG PO TABS: 650.0000 mg | ORAL_TABLET | ORAL | Status: DC | PRN

## 2023-08-18 MED ORDER — LIDOCAINE HCL (PF) 1 % IJ SOLN
INTRAMUSCULAR | Status: DC | PRN
  Administered 2023-08-18: 10 mL

## 2023-08-18 MED ORDER — METHYLPREDNISOLONE SODIUM SUCC 125 MG IJ SOLR: 125.0000 mg | Freq: Once | INTRAMUSCULAR | Status: DC | PRN

## 2023-08-18 MED ORDER — SODIUM CHLORIDE 0.9 % IV SOLN: 250.0000 mL | INTRAVENOUS | Status: DC | PRN

## 2023-08-18 MED ORDER — FENTANYL CITRATE (PF) 100 MCG/2ML IJ SOLN
INTRAMUSCULAR | Status: AC
  Filled 2023-08-18: qty 2

## 2023-08-18 MED ORDER — MIDAZOLAM HCL 5 MG/5ML IJ SOLN
INTRAMUSCULAR | Status: AC
  Filled 2023-08-18: qty 5

## 2023-08-18 SURGICAL SUPPLY — 15 items
CATH ANGIO 5F PIGTAIL 65CM (CATHETERS) IMPLANT
CATH TEMPO 5F RIM 65CM (CATHETERS) IMPLANT
COVER PROBE ULTRASOUND 5X96 (MISCELLANEOUS) IMPLANT
DEVICE STARCLOSE SE CLOSURE (Vascular Products) IMPLANT
GLIDEWIRE ADV .035X180CM (WIRE) IMPLANT
GOWN STRL REUS W/ TWL LRG LVL3 (GOWN DISPOSABLE) ×1 IMPLANT
GOWN STRL REUS W/TWL LRG LVL3 (GOWN DISPOSABLE) ×1
NDL ENTRY 21GA 7CM ECHOTIP (NEEDLE) IMPLANT
NEEDLE ENTRY 21GA 7CM ECHOTIP (NEEDLE) IMPLANT
PACK ANGIOGRAPHY (CUSTOM PROCEDURE TRAY) ×1 IMPLANT
SET INTRO CAPELLA COAXIAL (SET/KITS/TRAYS/PACK) IMPLANT
SHEATH BRITE TIP 5FRX11 (SHEATH) IMPLANT
SYR MEDRAD MARK 7 150ML (SYRINGE) IMPLANT
TUBING CONTRAST HIGH PRESS 72 (TUBING) IMPLANT
WIRE GUIDERIGHT .035X150 (WIRE) IMPLANT

## 2023-08-18 NOTE — Op Note (Signed)
[facility]  Percutaneous Study/Intervention Procedural Note   Date of Surgery: 08/18/2023,9:09 AM  Surgeon:Jorie Zee, Latina Craver   Pre-operative Diagnosis: Atherosclerotic occlusive disease bilateral lower extremities with rest pain bilaterally  Post-operative diagnosis:  Same  Procedure(s) Performed:  1.  Abdominal aortogram with bilateral lower extremity distal runoff  2.  Selective injection of the right lower extremity third order catheter placement  3.  Ultrasound-guided access to the right common femoral artery  4.  StarClose left femoral artery    Anesthesia: Conscious sedation was administered by the interventional radiology RN under my direct supervision. IV Versed plus fentanyl were utilized. Continuous ECG, pulse oximetry and blood pressure was monitored throughout the entire procedure.  Conscious sedation was administered for a total of 29 minutes.  Sheath: 5 French 11 cm Pinnacle sheath retrograde left common femoral  Contrast: 65 cc   Fluoroscopy Time: 4.0 minutes  Indications:  The patient presents to [facility] with atherosclerotic occlusive disease bilateral lower extremities with rest pain bilaterally.  Pedal pulses are nonpalpable bilaterally suggesting hemodynamically significant atherosclerotic occlusive disease.  The risks and benefits as well as alternative therapies for lower extremity revascularization are reviewed with the patient all questions are answered the patient agrees to proceed.  The patient is therefore undergoing angiography with the hope for intervention for limb salvage.   Procedure:  Wanda Clayton a 72 y.o. female who was identified and appropriate procedural time out was performed.  The patient was then placed supine on the table and prepped and draped in the usual sterile fashion.  Ultrasound was used to evaluate the left common femoral artery.  It was echolucent and pulsatile indicating it is patent .  An  ultrasound image was acquired for the permanent record.  A micropuncture needle was used to access the left common femoral artery under direct ultrasound guidance.  The microwire was then advanced under fluoroscopic guidance without difficulty followed by the micro-sheath.  A 0.035 J wire was advanced without resistance and a 5Fr sheath was placed.    Pigtail catheter was then advanced to the level of T12 and AP projection of the aorta was obtained. Pigtail catheter was then repositioned to above the bifurcation and LAO view of the pelvis was obtained. Stiff angled Glidewire and rim catheter was then used across the bifurcation and the rim catheter was exchanged for the pigtail catheter which was positioned in the distal external iliac artery.  RAO of the right groin was then obtained. Wire was reintroduced and negotiated into the SFA and the catheter was advanced into the SFA. Distal runoff was then performed.  Catheter was removed over wire and then distal runoff of the left lower extremity was obtained by hand-injection through the sheath.  After review of the images the catheter was removed over wire and an LAO view of the groin was obtained. StarClose device was deployed without difficulty.   Findings:   Aortogram: The abdominal aorta is opacified with a bolus injection contrast.  Single renal arteries are noted bilaterally with normal nephrograms.  No evidence of hemodynamically significant renal artery stenosis.  There are no hemodynamically significant stenoses identified within the aorta.  The aortic bifurcation is mildly diseased but widely patent.  Bilateral common internal and external iliac arteries are free of hemodynamically significant lesions.  Right lower Extremity: The right common femoral demonstrates a greater than 95% subtotal occlusion secondary to a large bulky densely calcified plaque.  The profunda femoris appears to be patent and  free of hemodynamically significant stenosis.  The  superficial femoral artery demonstrates a focal eccentric densely calcified greater than 90% stenosis associated with some additional lesions both proximal and distal that are greater than 60%.  This is predominantly located at [facility] canal and extends into the above-knee popliteal.  The popliteal otherwise demonstrates mild atherosclerotic changes but there are no hemodynamically significant lesions.  There is single vessel runoff to the foot via the peroneal which has a very large branch collateralizing to the plantar arteries which fills the entire pedal arch and the digital vessels.  The anterior tibial and posterior tibial are profoundly diseased and occlude in their midportion               Left lower Extremity: The left common femoral demonstrates a greater than 80% focal stenosis in its midportion this is densely calcified and similar to the right common femoral although not as severe.  The profunda femoris is patent and free of hemodynamically significant stenosis.  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  The mid and distal popliteal appears to be diseased but free of hemodynamically significant stenosis.  The anterior tibial is patent all the way down to the ankle.  The peroneal is also patent but much smaller particularly when compared to the right leg.  Posterior tibial is occluded.    SUMMARY: Based on these images no intervention is performed at this time.  She will require bilateral femoral endarterectomies with angioplasty and stent placement of the superficial femoral arteries bilaterally.    Disposition: Patient was taken to the recovery room in stable condition having tolerated the procedure well.  [name] 08/18/2023,9:09 AM

## 2023-08-18 NOTE — Interval H&P Note (Signed)
History and Physical Interval Note:  08/18/2023 7:51 AM  Wanda Clayton  has presented today for surgery, with the diagnosis of RLE Angio  ASO w rest pain.  The various methods of treatment have been discussed with the patient and family. After consideration of risks, benefits and other options for treatment, the patient has consented to  Procedure(s): Lower Extremity Angiography (Right) as a surgical intervention.  The patient's history has been reviewed, patient examined, no change in status, stable for surgery.  I have reviewed the patient's chart and labs.  Questions were answered to the patient's satisfaction.     Levora Dredge

## 2023-08-24 ENCOUNTER — Other Ambulatory Visit (INDEPENDENT_AMBULATORY_CARE_PROVIDER_SITE_OTHER): Payer: No Typology Code available for payment source

## 2023-08-24 DIAGNOSIS — R7989 Other specified abnormal findings of blood chemistry: Secondary | ICD-10-CM | POA: Diagnosis not present

## 2023-08-24 DIAGNOSIS — R899 Unspecified abnormal finding in specimens from other organs, systems and tissues: Secondary | ICD-10-CM | POA: Diagnosis not present

## 2023-08-24 LAB — HEPATIC FUNCTION PANEL
ALT: 21 U/L (ref 0–35)
AST: 30 U/L (ref 0–37)
Albumin: 4 g/dL (ref 3.5–5.2)
Alkaline Phosphatase: 109 U/L (ref 39–117)
Bilirubin, Direct: 0.1 mg/dL (ref 0.0–0.3)
Total Bilirubin: 0.4 mg/dL (ref 0.2–1.2)
Total Protein: 7 g/dL (ref 6.0–8.3)

## 2023-08-24 LAB — CBC WITH DIFFERENTIAL/PLATELET
Basophils Absolute: 0.1 10*3/uL (ref 0.0–0.1)
Basophils Relative: 0.9 % (ref 0.0–3.0)
Eosinophils Absolute: 0.1 10*3/uL (ref 0.0–0.7)
Eosinophils Relative: 1.3 % (ref 0.0–5.0)
HCT: 43.6 % (ref 36.0–46.0)
Hemoglobin: 14.6 g/dL (ref 12.0–15.0)
Lymphocytes Relative: 26.9 % (ref 12.0–46.0)
Lymphs Abs: 1.8 10*3/uL (ref 0.7–4.0)
MCHC: 33.5 g/dL (ref 30.0–36.0)
MCV: 102.6 fL — ABNORMAL HIGH (ref 78.0–100.0)
Monocytes Absolute: 0.5 10*3/uL (ref 0.1–1.0)
Monocytes Relative: 7.7 % (ref 3.0–12.0)
Neutro Abs: 4.2 10*3/uL (ref 1.4–7.7)
Neutrophils Relative %: 63.2 % (ref 43.0–77.0)
Platelets: 290 10*3/uL (ref 150.0–400.0)
RBC: 4.25 Mil/uL (ref 3.87–5.11)
RDW: 14.5 % (ref 11.5–15.5)
WBC: 6.7 10*3/uL (ref 4.0–10.5)

## 2023-08-25 ENCOUNTER — Encounter: Admission: RE | Payer: Self-pay | Source: Home / Self Care

## 2023-08-25 ENCOUNTER — Ambulatory Visit
Admission: RE | Admit: 2023-08-25 | Payer: No Typology Code available for payment source | Source: Home / Self Care | Admitting: Vascular Surgery

## 2023-08-25 DIAGNOSIS — I70229 Atherosclerosis of native arteries of extremities with rest pain, unspecified extremity: Secondary | ICD-10-CM

## 2023-08-25 LAB — CELIAC DISEASE AB SCREEN W/RFX
Antigliadin Abs, IgA: 11 U (ref 0–19)
IgA/Immunoglobulin A, Serum: 492 mg/dL — ABNORMAL HIGH (ref 64–422)
Transglutaminase IgA: <2 U/mL (ref 0–3)

## 2023-08-25 SURGERY — LOWER EXTREMITY ANGIOGRAPHY
Anesthesia: Moderate Sedation | Site: Leg Lower | Laterality: Left

## 2023-08-27 ENCOUNTER — Other Ambulatory Visit: Payer: Self-pay

## 2023-08-27 ENCOUNTER — Ambulatory Visit (INDEPENDENT_AMBULATORY_CARE_PROVIDER_SITE_OTHER): Payer: No Typology Code available for payment source | Admitting: Family

## 2023-08-27 ENCOUNTER — Encounter: Payer: Self-pay | Admitting: Family

## 2023-08-27 VITALS — BP 138/86 | HR 78 | Temp 97.8°F | Ht 63.0 in | Wt 132.0 lb

## 2023-08-27 DIAGNOSIS — I700 Atherosclerosis of aorta: Secondary | ICD-10-CM | POA: Diagnosis not present

## 2023-08-27 DIAGNOSIS — I10 Essential (primary) hypertension: Secondary | ICD-10-CM

## 2023-08-27 DIAGNOSIS — Z1231 Encounter for screening mammogram for malignant neoplasm of breast: Secondary | ICD-10-CM

## 2023-08-27 DIAGNOSIS — E785 Hyperlipidemia, unspecified: Secondary | ICD-10-CM | POA: Diagnosis not present

## 2023-08-27 MED ORDER — AMLODIPINE BESYLATE 5 MG PO TABS
5.0000 mg | ORAL_TABLET | Freq: Every day | ORAL | 3 refills | Status: DC | PRN
  Filled 2023-08-27: qty 90, 90d supply, fill #0
  Filled 2024-02-08: qty 90, 90d supply, fill #1
  Filled 2024-06-20: qty 90, 90d supply, fill #2

## 2023-08-27 MED ORDER — ATORVASTATIN CALCIUM 40 MG PO TABS
40.0000 mg | ORAL_TABLET | Freq: Every day | ORAL | 3 refills | Status: DC
  Filled 2023-08-27 – 2024-01-08 (×3): qty 90, 90d supply, fill #0

## 2023-08-27 NOTE — Assessment & Plan Note (Addendum)
Reviewed  recent blood pressures.  Previously stopped amlodipine when concerned exacerbating vertigo and patient having low end blood pressure.  Advised today to restart amlodipine 5mg  , advised to take BP > 130/80.  Lost to follow-up with cardiology, I have replaced referral to Dr. Mariah Milling.

## 2023-08-27 NOTE — Progress Notes (Signed)
Assessment & Plan:  Primary hypertension Assessment & Plan: Reviewed  recent blood pressures.  Previously stopped amlodipine when concerned exacerbating vertigo and patient having low end blood pressure.  Advised today to restart amlodipine 5mg  , advised to take BP > 130/80.  Lost to follow-up with cardiology, I have replaced referral to Dr. Mariah Milling.   Orders: -     Ambulatory referral to Cardiology  Hyperlipidemia, unspecified hyperlipidemia type Assessment & Plan: Lab Results  Component Value Date   LDLCALC 56 01/08/2022   Chronic, stable.  Continue Lipitor 40 mg daily.  Plan to update lipid panel at follow-up  Orders: -     Atorvastatin Calcium; TAKE 1 TABLET BY MOUTH DAILY AT 6 PM.  Dispense: 90 tablet; Refill: 3  Atherosclerosis of aorta (HCC) -     amLODIPine Besylate; Take 1 tablet (5 mg total) by mouth daily as needed.  Dispense: 90 tablet; Refill: 3 -     Atorvastatin Calcium; TAKE 1 TABLET BY MOUTH DAILY AT 6 PM.  Dispense: 90 tablet; Refill: 3 -     Ambulatory referral to Cardiology  Encounter for screening mammogram for malignant neoplasm of breast -     3D Screening Mammogram, Left and Right; Future     Return precautions given.   Risks, benefits, and alternatives of the medications and treatment plan prescribed today were discussed, and patient expressed understanding.   Education regarding symptom management and diagnosis given to patient on AVS either electronically or printed.  Return in about 6 months (around 02/24/2024).  Rennie Plowman, FNP  Subjective:    [name], female    DOB: [birth year], 72 y.o.   MRN: [id number]  CC: Wanda Clayton is a 72 y.o. female who presents today for follow up.   HPI: She overall feels well today.  No new complaints.  She continues to have intense leg pain with ambulation.   She is following closely with vascular. She is no longer on amlodipine no recurrence of vertigo.  She has not had to use  Antivert   Follow-up vascular scheduled on the 21st.  Canceled lower extremity angiography as more extensive procedure will be planned.  Last follow up Dr Mariah Milling 09/11/21; she has been taking amlodipine 7.5 mg at that time.   Severe right PAD and moderate left PAD seen ABI 07/2023  Allergies: Pollen extract Current Outpatient Medications on File Prior to Visit  Medication Sig Dispense Refill   aspirin 81 MG tablet Take 81 mg by mouth daily.     ezetimibe (ZETIA) 10 MG tablet Take 1 tablet (10 mg total) by mouth daily. 90 tablet 3   Multiple Vitamins-Minerals (EQ MULTIVITAMINS ADULT GUMMY PO) Take by mouth daily.     meclizine (ANTIVERT) 12.5 MG tablet Take 1 tablet (12.5 mg total) by mouth 3 (three) times daily as needed (vertigo). (Patient not taking: Reported on 08/27/2023) 30 tablet 0   No current facility-administered medications on file prior to visit.    Review of Systems  Constitutional:  Negative for chills and fever.  Respiratory:  Negative for cough.   Cardiovascular:  Negative for chest pain, palpitations and leg swelling.  Gastrointestinal:  Negative for nausea and vomiting.  Musculoskeletal:  Positive for arthralgias (leg pain).      Objective:    BP 138/86   Pulse 78   Temp 97.8 F (36.6 C) (Oral)   Ht 5\' 3"  (1.6 m)   Wt 132 lb (59.9 kg)   SpO2 97%  BMI 23.38 kg/m  BP Readings from Last 3 Encounters:  08/27/23 138/86  08/18/23 (!) 179/91  08/05/23 (!) 174/78   Wt Readings from Last 3 Encounters:  08/27/23 132 lb (59.9 kg)  08/18/23 133 lb 4.8 oz (60.5 kg)  08/05/23 132 lb 9.6 oz (60.1 kg)    Physical Exam Vitals reviewed.  Constitutional:      Appearance: She is well-developed.  Eyes:     Conjunctiva/sclera: Conjunctivae normal.  Cardiovascular:     Rate and Rhythm: Normal rate and regular rhythm.     Pulses: Normal pulses.     Heart sounds: Normal heart sounds.  Pulmonary:     Effort: Pulmonary effort is normal.     Breath sounds: Normal  breath sounds. No wheezing, rhonchi or rales.  Skin:    General: Skin is warm and dry.  Neurological:     Mental Status: She is alert.  Psychiatric:        Speech: Speech normal.        Behavior: Behavior normal.        Thought Content: Thought content normal.

## 2023-08-27 NOTE — Patient Instructions (Addendum)
Restart amlodipine 5mg    Goal of Blood pressure < 130/80  Referral to Dr Billey Co are due for annual lung cancer screening program January 2025.    Previously the program had been managed under [facility] hematology/oncology, now it has been moved to [facility] pulmonology .    I have placed a referral to [facility] pulmonology  and their office will reach out to you to schedule your CT of your chest.  They will reach out to you annually going forward.  If you do not hear from their office in the next 1 to 2 weeks, please call [facility] Pulmonology at [phone]  Please call  and schedule your 3D mammogram and /or bone density scan as we discussed.   [facility]  ( new location in 2023)  [address], [place], [place] [address]  [place], [place]  [phone]   So let me know if there are any issues in getting scheduled.

## 2023-08-28 ENCOUNTER — Other Ambulatory Visit: Payer: Self-pay

## 2023-08-28 LAB — HOMOCYSTEINE: Homocysteine: 103.5 umol/L — ABNORMAL HIGH (ref <10.4)

## 2023-08-28 LAB — METHYLMALONIC ACID, SERUM: Methylmalonic Acid, Quant: 229 nmol/L (ref 69–390)

## 2023-08-28 LAB — INTRINSIC FACTOR ANTIBODIES: Intrinsic Factor: NEGATIVE

## 2023-08-28 NOTE — Assessment & Plan Note (Signed)
Lab Results  Component Value Date   LDLCALC 56 01/08/2022   Chronic, stable.  Continue Lipitor 40 mg daily.  Plan to update lipid panel at follow-up

## 2023-09-01 ENCOUNTER — Other Ambulatory Visit: Payer: Self-pay | Admitting: Family

## 2023-09-01 ENCOUNTER — Encounter: Payer: Self-pay | Admitting: Family

## 2023-09-01 DIAGNOSIS — E538 Deficiency of other specified B group vitamins: Secondary | ICD-10-CM | POA: Insufficient documentation

## 2023-09-01 DIAGNOSIS — R899 Unspecified abnormal finding in specimens from other organs, systems and tissues: Secondary | ICD-10-CM

## 2023-09-01 DIAGNOSIS — I70229 Atherosclerosis of native arteries of extremities with rest pain, unspecified extremity: Secondary | ICD-10-CM | POA: Insufficient documentation

## 2023-09-01 NOTE — Progress Notes (Signed)
MRN : [id number]  Wanda Clayton is a 72 y.o. (12-15-1950) female who presents with chief complaint of check circulation.  History of Present Illness:   The patient returns to the office for followup and review status post angiogram with intervention on 08/18/2023.   Procedure: Selective injection of the  Right lower Extremity: The right common femoral demonstrates a greater than 95% subtotal occlusion secondary to a large bulky densely calcified plaque.  The profunda femoris appears to be patent and free of hemodynamically significant stenosis.  The superficial femoral artery demonstrates a focal eccentric densely calcified greater than 90% stenosis associated with some additional lesions both proximal and distal that are greater than 60%.  This is predominantly located at [facility] canal and extends into the above-knee popliteal.  The popliteal otherwise demonstrates mild atherosclerotic changes but there are no hemodynamically significant lesions.  There is single vessel runoff to the foot via the peroneal which has a very large branch collateralizing to the plantar arteries which fills the entire pedal arch and the digital vessels.  The anterior tibial and posterior tibial are profoundly diseased and occlude in their midportion               Left lower Extremity: The left common femoral demonstrates a greater than 80% focal stenosis in its midportion this is densely calcified and similar to the right common femoral although not as severe.  The profunda femoris is patent and free of hemodynamically significant stenosis.  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  The mid and distal popliteal appears to be diseased but free of hemodynamically significant stenosis.  The anterior tibial is patent all the way down to the ankle.  The  peroneal is also patent but much smaller particularly when compared to the right leg.  Posterior tibial is occluded.  right lower extremity third order catheter placement:  The patient notes increasing deterioration in the lower extremity symptoms. With interval shortening of the patient's claudication distance and worsening of her rest pain symptoms. No new ulcers or wounds have occurred since the last visit.  There have been no significant changes to the patient's overall health care.  No documented history of amaurosis fugax or recent TIA symptoms. There are no recent neurological changes noted. No documented history of DVT, PE or superficial thrombophlebitis. The patient denies recent episodes of angina or shortness of breath.    No outpatient medications have been marked as taking for the 09/03/23 encounter (Appointment) with Gilda Crease, Latina Craver, MD.    Past Medical History:  Diagnosis Date   Acute pain of left foot 02/08/2020   Anxiety    Depression    Elevated liver enzymes 11/28/2020   Hypertension     Past Surgical History:  Procedure Laterality Date   BUNIONECTOMY Bilateral    carpal tunnel repair     COLONOSCOPY WITH PROPOFOL N/A 08/19/2021   Procedure: COLONOSCOPY WITH PROPOFOL;  Surgeon: Toney Reil, MD;  Location: [facility] ENDOSCOPY;  Service: Gastroenterology;  Laterality: N/A;   DENTAL  SURGERY     FOOT SURGERY Right    LOWER EXTREMITY ANGIOGRAPHY Right 08/18/2023   Procedure: Lower Extremity Angiography;  Surgeon: Renford Dills, MD;  Location: [facility] INVASIVE CV LAB;  Service: Cardiovascular;  Laterality: Right;   TUBAL LIGATION     VAGINAL DELIVERY     x1    Social History Social History   Tobacco Use   Smoking status: Every Day    Current packs/day: 0.50    Average packs/day: 0.5 packs/day for 36.0 years (18.0 ttl pk-yrs)    Types: Cigarettes   Smokeless tobacco: Never   Tobacco comments:    she plans to quit without medication  Vaping Use    Vaping status: Never Used  Substance Use Topics   Alcohol use: Yes    Comment: Daily glass wine x4   Drug use: No    Family History Family History  Problem Relation Age of Onset   Hypertension Mother    Colon cancer Mother 52   Atrial fibrillation Mother    Dementia Mother    Heart disease Father    Diabetes Father    Colon cancer Father 56   Congestive Heart Failure Father    COPD Sister    Crohn's disease Sister    Colon cancer Sister 35   Heart attack Sister    Heart disease Brother    Breast cancer Neg Hx     Allergies  Allergen Reactions   Pollen Extract Itching     REVIEW OF SYSTEMS (Negative unless checked)  Constitutional: [] Weight loss  [] Fever  [] Chills Cardiac: [] Chest pain   [] Chest pressure   [] Palpitations   [] Shortness of breath when laying flat   [] Shortness of breath with exertion. Vascular:  [x] Pain in legs with walking   [] Pain in legs at rest  [] History of DVT   [] Phlebitis   [] Swelling in legs   [] Varicose veins   [] Non-healing ulcers Pulmonary:   [] Uses home oxygen   [] Productive cough   [] Hemoptysis   [] Wheeze  [] COPD   [] Asthma Neurologic:  [] Dizziness   [] Seizures   [] History of stroke   [] History of TIA  [] Aphasia   [] Vissual changes   [] Weakness or numbness in arm   [] Weakness or numbness in leg Musculoskeletal:   [] Joint swelling   [] Joint pain   [] Low back pain Hematologic:  [] Easy bruising  [] Easy bleeding   [] Hypercoagulable state   [] Anemic Gastrointestinal:  [] Diarrhea   [] Vomiting  [] Gastroesophageal reflux/heartburn   [] Difficulty swallowing. Genitourinary:  [] Chronic kidney disease   [] Difficult urination  [] Frequent urination   [] Blood in urine Skin:  [] Rashes   [] Ulcers  Psychological:  [x] History of anxiety   []  History of major depression.  Physical Examination  There were no vitals filed for this visit. There is no height or weight on file to calculate BMI. Gen: WD/WN, NAD Head: [place]/AT, No temporalis wasting.   Ear/Nose/Throat: Hearing grossly intact, nares w/o erythema or drainage Eyes: PER, EOMI, sclera nonicteric.  Neck: Supple, no masses.  No bruit or JVD.  Pulmonary:  Good air movement, no audible wheezing, no use of accessory muscles.  Cardiac: RRR, normal S1, S2, no Murmurs. Vascular:  mild trophic changes, no open wounds Vessel Right Left  Radial Palpable Palpable  PT Not Palpable Not Palpable  DP Not Palpable Not Palpable  Gastrointestinal: soft, non-distended. No guarding/no peritoneal signs.  Musculoskeletal: M/S 5/5 throughout.  No visible deformity.  Neurologic: CN 2-12 intact. Pain and light touch intact in extremities.  Symmetrical.  Speech is fluent. Motor exam as listed above. Psychiatric: Judgment intact, Mood & affect appropriate for pt's clinical situation. Dermatologic: No rashes or ulcers noted.  No changes consistent with cellulitis.   CBC Lab Results  Component Value Date   WBC 6.7 08/24/2023   HGB 14.6 08/24/2023   HCT 43.6 08/24/2023   MCV 102.6 (H) 08/24/2023   PLT 290.0 08/24/2023    BMET    Component Value Date/Time   NA 138 04/15/2023 1124   NA 140 04/19/2015 0000   K 4.0 04/15/2023 1124   CL 102 04/15/2023 1124   CO2 28 04/15/2023 1124   GLUCOSE 90 04/15/2023 1124   BUN 13 08/18/2023 0724   BUN 14 04/19/2015 0000   CREATININE 0.62 08/18/2023 0724   CALCIUM 8.8 04/15/2023 1124   GFRNONAA >60 08/18/2023 0724   Estimated Creatinine Clearance: 52.6 mL/min (by C-G formula based on SCr of 0.62 mg/dL).  COAG Lab Results  Component Value Date   INR 0.9 07/08/2022    Radiology PERIPHERAL VASCULAR CATHETERIZATION  Result Date: 08/18/2023 See surgical note for result.  VAS [place] ABI WITH/WO TBI  Result Date: 08/10/2023  LOWER EXTREMITY DOPPLER STUDY Patient Name:  Wanda Clayton  Date of Exam:   08/05/2023 Medical Rec #: [id number]       Accession #:    [id number] Date of Birth: 1951/01/12        Patient Gender: F Patient Age:   40 years Exam  Location:  [facility] Procedure:      VAS [place] ABI WITH/WO TBI Referring Phys: Sheppard Plumber --------------------------------------------------------------------------------  Indications: Claudication, and peripheral artery disease. High Risk Factors: Hypertension, current smoker, coronary artery disease.  Performing Technologist: Hardie Lora RVT  Examination Guidelines: A complete evaluation includes at minimum, Doppler waveform signals and systolic blood pressure reading at the level of bilateral brachial, anterior tibial, and posterior tibial arteries, when vessel segments are accessible. Bilateral testing is considered an integral part of a complete examination. Photoelectric Plethysmograph (PPG) waveforms and toe systolic pressure readings are included as required and additional duplex testing as needed. Limited examinations for reoccurring indications may be performed as noted.  ABI Findings: +---------+------------------+-----+----------+--------+ Right    Rt Pressure (mmHg)IndexWaveform  Comment  +---------+------------------+-----+----------+--------+ Brachial 190                                       +---------+------------------+-----+----------+--------+ PTA      79                0.42 monophasic         +---------+------------------+-----+----------+--------+ DP       80                0.42 monophasic         +---------+------------------+-----+----------+--------+ Great Toe72                0.38                    +---------+------------------+-----+----------+--------+ +---------+------------------+-----+----------+-------+ Left     Lt Pressure (mmHg)IndexWaveform  Comment +---------+------------------+-----+----------+-------+ Brachial 119                                      +---------+------------------+-----+----------+-------+ PTA      112  0.59 monophasic        +---------+------------------+-----+----------+-------+ DP        105               0.55 monophasic        +---------+------------------+-----+----------+-------+ Great Toe56                0.29                   +---------+------------------+-----+----------+-------+ +-------+-----------+-----------+------------+------------+ ABI/TBIToday's ABIToday's TBIPrevious ABIPrevious TBI +-------+-----------+-----------+------------+------------+ Right  0.42       0.38                                +-------+-----------+-----------+------------+------------+ Left   0.59       0.29                                +-------+-----------+-----------+------------+------------+  Summary: Right: Resting right ankle-brachial index indicates severe right lower extremity arterial disease. The right toe-brachial index is abnormal. Left: Resting left ankle-brachial index indicates moderate left lower extremity arterial disease. The left toe-brachial index is abnormal. Limited imaging and difference in systolic brachial pressures indicative of left subclavian artery stenosis. *See table(s) above for measurements and observations.  Electronically signed by Levora Dredge MD on 08/10/2023 at 5:08:50 PM.    Final    VAS US AORTA/IVC/ILIACS  Result Date: 08/10/2023 ABDOMINAL AORTA STUDY Patient Name:  Wanda Clayton  Date of Exam:   08/05/2023 Medical Rec #: [id number]       Accession #:    [id number] Date of Birth: 11/04/1950        Patient Gender: F Patient Age:   76 years Exam Location:  [facility] Procedure:      VAS US AORTA/IVC/ILIACS Referring Phys: Sheppard Plumber --------------------------------------------------------------------------------  Risk Factors: Hypertension, current smoker, coronary artery disease. Limitations: Patient discomfort.  Performing Technologist: Hardie Lora RVT  Examination Guidelines: A complete evaluation includes B-mode imaging, spectral Doppler, color Doppler, and power Doppler as needed of all accessible portions of  each vessel. Bilateral testing is considered an integral part of a complete examination. Limited examinations for reoccurring indications may be performed as noted.  Abdominal Aorta Findings: +-------------+-------+----------+----------+--------+--------+--------+ Location     AP (cm)Trans (cm)PSV (cm/s)WaveformThrombusComments +-------------+-------+----------+----------+--------+--------+--------+ Proximal     1.22   1.16      76                                 +-------------+-------+----------+----------+--------+--------+--------+ Mid          1.57   1.58      85                                 +-------------+-------+----------+----------+--------+--------+--------+ Distal       1.36   1.47      82                                 +-------------+-------+----------+----------+--------+--------+--------+ RT CIA Prox  0.8    0.8       99                                 +-------------+-------+----------+----------+--------+--------+--------+  RT EIA Distal                 562                                +-------------+-------+----------+----------+--------+--------+--------+ LT CIA Prox  0.8    0.8       105                                +-------------+-------+----------+----------+--------+--------+--------+ LT EIA Distal                 182                                +-------------+-------+----------+----------+--------+--------+--------+  Summary: Abdominal Aorta: No evidence of an abdominal aortic aneurysm was visualized. Stenosis: +--------------------+-------------+ Location            Stenosis      +--------------------+-------------+ Right External Iliac>50% stenosis +--------------------+-------------+ Limited imaging suggests left subclavian stenosis.  *See table(s) above for measurements and observations.  Electronically signed by Levora Dredge MD on 08/10/2023 at 5:08:44 PM.    Final      Assessment/Plan 1. 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  This represents a limb threatening ischemia and places the patient at a high risk for limb loss.  Angiography has been performed and the situation is not ideal for intervention.  Given this finding open surgical repair is recommended.   Patient should undergo arterial reconstruction, bilateral femoral endarterectomies with bilateral SFA stenting of the both lower extremities with the hope for limb salvage.  The risks and benefits as well as the alternative therapies was discussed in detail with the patient.  All questions were answered.  Patient agrees to proceed with open vascular surgical reconstruction.  The patient will follow up with me in the office after the procedure.    2. Primary hypertension Continue antihypertensive medications as already ordered, these medications have been reviewed and there are no changes at this time.  3. Carotid atherosclerosis, bilateral Recommend:  Given the patient's asymptomatic subcritical stenosis no further invasive testing or surgery at this time.  Continue antiplatelet therapy as prescribed Continue management of CAD, HTN and Hyperlipidemia Healthy heart diet,  encouraged exercise at least 4 times per week  Follow up in 12 months with duplex ultrasound and physical exam   4. Coronary artery calcification seen on CT scan Continue cardiac and antihypertensive medications as already ordered and reviewed, no changes at this time.  Continue statin as ordered and reviewed, no changes at this time  Nitrates PRN for chest pain  5. Mixed hyperlipidemia Continue statin as ordered and reviewed, no changes at this time    Levora Dredge, MD  09/01/2023 9:05 PM

## 2023-09-01 NOTE — H&P (View-Only) (Signed)
 MRN : [id number]  Wanda Clayton is a 72 y.o. (12-15-1950) female who presents with chief complaint of check circulation.  History of Present Illness:   The patient returns to the office for followup and review status post angiogram with intervention on 08/18/2023.   Procedure: Selective injection of the  Right lower Extremity: The right common femoral demonstrates a greater than 95% subtotal occlusion secondary to a large bulky densely calcified plaque.  The profunda femoris appears to be patent and free of hemodynamically significant stenosis.  The superficial femoral artery demonstrates a focal eccentric densely calcified greater than 90% stenosis associated with some additional lesions both proximal and distal that are greater than 60%.  This is predominantly located at [facility] canal and extends into the above-knee popliteal.  The popliteal otherwise demonstrates mild atherosclerotic changes but there are no hemodynamically significant lesions.  There is single vessel runoff to the foot via the peroneal which has a very large branch collateralizing to the plantar arteries which fills the entire pedal arch and the digital vessels.  The anterior tibial and posterior tibial are profoundly diseased and occlude in their midportion               Left lower Extremity: The left common femoral demonstrates a greater than 80% focal stenosis in its midportion this is densely calcified and similar to the right common femoral although not as severe.  The profunda femoris is patent and free of hemodynamically significant stenosis.  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  The mid and distal popliteal appears to be diseased but free of hemodynamically significant stenosis.  The anterior tibial is patent all the way down to the ankle.  The  peroneal is also patent but much smaller particularly when compared to the right leg.  Posterior tibial is occluded.  right lower extremity third order catheter placement:  The patient notes increasing deterioration in the lower extremity symptoms. With interval shortening of the patient's claudication distance and worsening of her rest pain symptoms. No new ulcers or wounds have occurred since the last visit.  There have been no significant changes to the patient's overall health care.  No documented history of amaurosis fugax or recent TIA symptoms. There are no recent neurological changes noted. No documented history of DVT, PE or superficial thrombophlebitis. The patient denies recent episodes of angina or shortness of breath.    No outpatient medications have been marked as taking for the 09/03/23 encounter (Appointment) with Gilda Crease, Latina Craver, MD.    Past Medical History:  Diagnosis Date   Acute pain of left foot 02/08/2020   Anxiety    Depression    Elevated liver enzymes 11/28/2020   Hypertension     Past Surgical History:  Procedure Laterality Date   BUNIONECTOMY Bilateral    carpal tunnel repair     COLONOSCOPY WITH PROPOFOL N/A 08/19/2021   Procedure: COLONOSCOPY WITH PROPOFOL;  Surgeon: Toney Reil, MD;  Location: [facility] ENDOSCOPY;  Service: Gastroenterology;  Laterality: N/A;   DENTAL  SURGERY     FOOT SURGERY Right    LOWER EXTREMITY ANGIOGRAPHY Right 08/18/2023   Procedure: Lower Extremity Angiography;  Surgeon: Renford Dills, MD;  Location: [facility] INVASIVE CV LAB;  Service: Cardiovascular;  Laterality: Right;   TUBAL LIGATION     VAGINAL DELIVERY     x1    Social History Social History   Tobacco Use   Smoking status: Every Day    Current packs/day: 0.50    Average packs/day: 0.5 packs/day for 36.0 years (18.0 ttl pk-yrs)    Types: Cigarettes   Smokeless tobacco: Never   Tobacco comments:    she plans to quit without medication  Vaping Use    Vaping status: Never Used  Substance Use Topics   Alcohol use: Yes    Comment: Daily glass wine x4   Drug use: No    Family History Family History  Problem Relation Age of Onset   Hypertension Mother    Colon cancer Mother 52   Atrial fibrillation Mother    Dementia Mother    Heart disease Father    Diabetes Father    Colon cancer Father 56   Congestive Heart Failure Father    COPD Sister    Crohn's disease Sister    Colon cancer Sister 35   Heart attack Sister    Heart disease Brother    Breast cancer Neg Hx     Allergies  Allergen Reactions   Pollen Extract Itching     REVIEW OF SYSTEMS (Negative unless checked)  Constitutional: [] Weight loss  [] Fever  [] Chills Cardiac: [] Chest pain   [] Chest pressure   [] Palpitations   [] Shortness of breath when laying flat   [] Shortness of breath with exertion. Vascular:  [x] Pain in legs with walking   [] Pain in legs at rest  [] History of DVT   [] Phlebitis   [] Swelling in legs   [] Varicose veins   [] Non-healing ulcers Pulmonary:   [] Uses home oxygen   [] Productive cough   [] Hemoptysis   [] Wheeze  [] COPD   [] Asthma Neurologic:  [] Dizziness   [] Seizures   [] History of stroke   [] History of TIA  [] Aphasia   [] Vissual changes   [] Weakness or numbness in arm   [] Weakness or numbness in leg Musculoskeletal:   [] Joint swelling   [] Joint pain   [] Low back pain Hematologic:  [] Easy bruising  [] Easy bleeding   [] Hypercoagulable state   [] Anemic Gastrointestinal:  [] Diarrhea   [] Vomiting  [] Gastroesophageal reflux/heartburn   [] Difficulty swallowing. Genitourinary:  [] Chronic kidney disease   [] Difficult urination  [] Frequent urination   [] Blood in urine Skin:  [] Rashes   [] Ulcers  Psychological:  [x] History of anxiety   []  History of major depression.  Physical Examination  There were no vitals filed for this visit. There is no height or weight on file to calculate BMI. Gen: WD/WN, NAD Head: [place]/AT, No temporalis wasting.   Ear/Nose/Throat: Hearing grossly intact, nares w/o erythema or drainage Eyes: PER, EOMI, sclera nonicteric.  Neck: Supple, no masses.  No bruit or JVD.  Pulmonary:  Good air movement, no audible wheezing, no use of accessory muscles.  Cardiac: RRR, normal S1, S2, no Murmurs. Vascular:  mild trophic changes, no open wounds Vessel Right Left  Radial Palpable Palpable  PT Not Palpable Not Palpable  DP Not Palpable Not Palpable  Gastrointestinal: soft, non-distended. No guarding/no peritoneal signs.  Musculoskeletal: M/S 5/5 throughout.  No visible deformity.  Neurologic: CN 2-12 intact. Pain and light touch intact in extremities.  Symmetrical.  Speech is fluent. Motor exam as listed above. Psychiatric: Judgment intact, Mood & affect appropriate for pt's clinical situation. Dermatologic: No rashes or ulcers noted.  No changes consistent with cellulitis.   CBC Lab Results  Component Value Date   WBC 6.7 08/24/2023   HGB 14.6 08/24/2023   HCT 43.6 08/24/2023   MCV 102.6 (H) 08/24/2023   PLT 290.0 08/24/2023    BMET    Component Value Date/Time   NA 138 04/15/2023 1124   NA 140 04/19/2015 0000   K 4.0 04/15/2023 1124   CL 102 04/15/2023 1124   CO2 28 04/15/2023 1124   GLUCOSE 90 04/15/2023 1124   BUN 13 08/18/2023 0724   BUN 14 04/19/2015 0000   CREATININE 0.62 08/18/2023 0724   CALCIUM 8.8 04/15/2023 1124   GFRNONAA >60 08/18/2023 0724   Estimated Creatinine Clearance: 52.6 mL/min (by C-G formula based on SCr of 0.62 mg/dL).  COAG Lab Results  Component Value Date   INR 0.9 07/08/2022    Radiology PERIPHERAL VASCULAR CATHETERIZATION  Result Date: 08/18/2023 See surgical note for result.  VAS [place] ABI WITH/WO TBI  Result Date: 08/10/2023  LOWER EXTREMITY DOPPLER STUDY Patient Name:  TAJ DILS  Date of Exam:   08/05/2023 Medical Rec #: [id number]       Accession #:    [id number] Date of Birth: 1951/01/12        Patient Gender: F Patient Age:   40 years Exam  Location:  [facility] Procedure:      VAS [place] ABI WITH/WO TBI Referring Phys: Sheppard Plumber --------------------------------------------------------------------------------  Indications: Claudication, and peripheral artery disease. High Risk Factors: Hypertension, current smoker, coronary artery disease.  Performing Technologist: Hardie Lora RVT  Examination Guidelines: A complete evaluation includes at minimum, Doppler waveform signals and systolic blood pressure reading at the level of bilateral brachial, anterior tibial, and posterior tibial arteries, when vessel segments are accessible. Bilateral testing is considered an integral part of a complete examination. Photoelectric Plethysmograph (PPG) waveforms and toe systolic pressure readings are included as required and additional duplex testing as needed. Limited examinations for reoccurring indications may be performed as noted.  ABI Findings: +---------+------------------+-----+----------+--------+ Right    Rt Pressure (mmHg)IndexWaveform  Comment  +---------+------------------+-----+----------+--------+ Brachial 190                                       +---------+------------------+-----+----------+--------+ PTA      79                0.42 monophasic         +---------+------------------+-----+----------+--------+ DP       80                0.42 monophasic         +---------+------------------+-----+----------+--------+ Great Toe72                0.38                    +---------+------------------+-----+----------+--------+ +---------+------------------+-----+----------+-------+ Left     Lt Pressure (mmHg)IndexWaveform  Comment +---------+------------------+-----+----------+-------+ Brachial 119                                      +---------+------------------+-----+----------+-------+ PTA      112  0.59 monophasic        +---------+------------------+-----+----------+-------+ DP        105               0.55 monophasic        +---------+------------------+-----+----------+-------+ Great Toe56                0.29                   +---------+------------------+-----+----------+-------+ +-------+-----------+-----------+------------+------------+ ABI/TBIToday's ABIToday's TBIPrevious ABIPrevious TBI +-------+-----------+-----------+------------+------------+ Right  0.42       0.38                                +-------+-----------+-----------+------------+------------+ Left   0.59       0.29                                +-------+-----------+-----------+------------+------------+  Summary: Right: Resting right ankle-brachial index indicates severe right lower extremity arterial disease. The right toe-brachial index is abnormal. Left: Resting left ankle-brachial index indicates moderate left lower extremity arterial disease. The left toe-brachial index is abnormal. Limited imaging and difference in systolic brachial pressures indicative of left subclavian artery stenosis. *See table(s) above for measurements and observations.  Electronically signed by Levora Dredge MD on 08/10/2023 at 5:08:50 PM.    Final    VAS US AORTA/IVC/ILIACS  Result Date: 08/10/2023 ABDOMINAL AORTA STUDY Patient Name:  CHANEL HANIGAN [facility]  Date of Exam:   08/05/2023 Medical Rec #: [id number]       Accession #:    [id number] Date of Birth: 11/04/1950        Patient Gender: F Patient Age:   76 years Exam Location:  [facility] Procedure:      VAS US AORTA/IVC/ILIACS Referring Phys: Sheppard Plumber --------------------------------------------------------------------------------  Risk Factors: Hypertension, current smoker, coronary artery disease. Limitations: Patient discomfort.  Performing Technologist: Hardie Lora RVT  Examination Guidelines: A complete evaluation includes B-mode imaging, spectral Doppler, color Doppler, and power Doppler as needed of all accessible portions of  each vessel. Bilateral testing is considered an integral part of a complete examination. Limited examinations for reoccurring indications may be performed as noted.  Abdominal Aorta Findings: +-------------+-------+----------+----------+--------+--------+--------+ Location     AP (cm)Trans (cm)PSV (cm/s)WaveformThrombusComments +-------------+-------+----------+----------+--------+--------+--------+ Proximal     1.22   1.16      76                                 +-------------+-------+----------+----------+--------+--------+--------+ Mid          1.57   1.58      85                                 +-------------+-------+----------+----------+--------+--------+--------+ Distal       1.36   1.47      82                                 +-------------+-------+----------+----------+--------+--------+--------+ RT CIA Prox  0.8    0.8       99                                 +-------------+-------+----------+----------+--------+--------+--------+  RT EIA Distal                 562                                +-------------+-------+----------+----------+--------+--------+--------+ LT CIA Prox  0.8    0.8       105                                +-------------+-------+----------+----------+--------+--------+--------+ LT EIA Distal                 182                                +-------------+-------+----------+----------+--------+--------+--------+  Summary: Abdominal Aorta: No evidence of an abdominal aortic aneurysm was visualized. Stenosis: +--------------------+-------------+ Location            Stenosis      +--------------------+-------------+ Right External Iliac>50% stenosis +--------------------+-------------+ Limited imaging suggests left subclavian stenosis.  *See table(s) above for measurements and observations.  Electronically signed by Levora Dredge MD on 08/10/2023 at 5:08:44 PM.    Final      Assessment/Plan 1. 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  This represents a limb threatening ischemia and places the patient at a high risk for limb loss.  Angiography has been performed and the situation is not ideal for intervention.  Given this finding open surgical repair is recommended.   Patient should undergo arterial reconstruction, bilateral femoral endarterectomies with bilateral SFA stenting of the both lower extremities with the hope for limb salvage.  The risks and benefits as well as the alternative therapies was discussed in detail with the patient.  All questions were answered.  Patient agrees to proceed with open vascular surgical reconstruction.  The patient will follow up with me in the office after the procedure.    2. Primary hypertension Continue antihypertensive medications as already ordered, these medications have been reviewed and there are no changes at this time.  3. Carotid atherosclerosis, bilateral Recommend:  Given the patient's asymptomatic subcritical stenosis no further invasive testing or surgery at this time.  Continue antiplatelet therapy as prescribed Continue management of CAD, HTN and Hyperlipidemia Healthy heart diet,  encouraged exercise at least 4 times per week  Follow up in 12 months with duplex ultrasound and physical exam   4. Coronary artery calcification seen on CT scan Continue cardiac and antihypertensive medications as already ordered and reviewed, no changes at this time.  Continue statin as ordered and reviewed, no changes at this time  Nitrates PRN for chest pain  5. Mixed hyperlipidemia Continue statin as ordered and reviewed, no changes at this time    Levora Dredge, MD  09/01/2023 9:05 PM

## 2023-09-03 ENCOUNTER — Ambulatory Visit (INDEPENDENT_AMBULATORY_CARE_PROVIDER_SITE_OTHER): Payer: No Typology Code available for payment source | Admitting: Vascular Surgery

## 2023-09-03 ENCOUNTER — Encounter (INDEPENDENT_AMBULATORY_CARE_PROVIDER_SITE_OTHER): Payer: Self-pay | Admitting: Vascular Surgery

## 2023-09-03 VITALS — BP 144/73 | HR 86 | Resp 16 | Wt 133.0 lb

## 2023-09-03 DIAGNOSIS — I6523 Occlusion and stenosis of bilateral carotid arteries: Secondary | ICD-10-CM | POA: Diagnosis not present

## 2023-09-03 DIAGNOSIS — I10 Essential (primary) hypertension: Secondary | ICD-10-CM

## 2023-09-03 DIAGNOSIS — I2510 Atherosclerotic heart disease of native coronary artery without angina pectoris: Secondary | ICD-10-CM | POA: Diagnosis not present

## 2023-09-03 DIAGNOSIS — I70223 Atherosclerosis of native arteries of extremities with rest pain, bilateral legs: Secondary | ICD-10-CM

## 2023-09-03 DIAGNOSIS — E782 Mixed hyperlipidemia: Secondary | ICD-10-CM

## 2023-09-04 ENCOUNTER — Encounter (INDEPENDENT_AMBULATORY_CARE_PROVIDER_SITE_OTHER): Payer: Self-pay | Admitting: Vascular Surgery

## 2023-09-06 IMAGING — US US ABDOMEN LIMITED
1 series · 14 of 25 positions shown · non-contrast
Comparison: None Available.

CLINICAL DATA: Elevated alkaline phosphatase

EXAM:
ULTRASOUND ABDOMEN LIMITED RIGHT UPPER QUADRANT

[Series 1: us abdomen limited · 0.17mm/px · 14 of 28 slices shown]
[im 1/28]
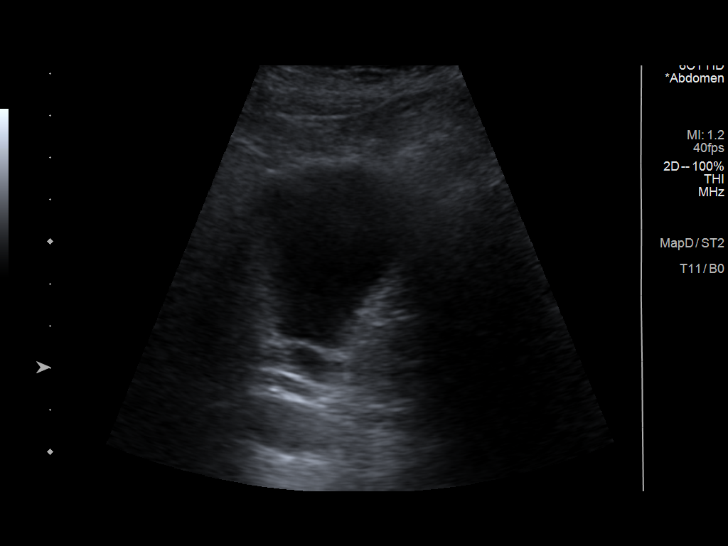
[im 3/28]
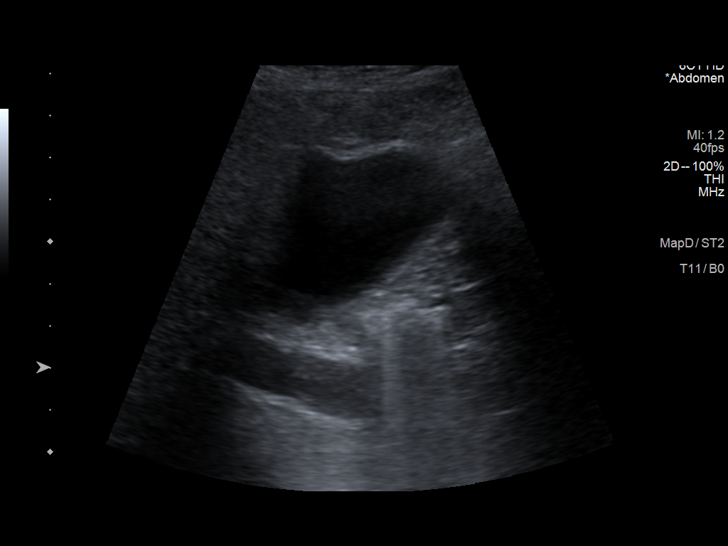
[im 5/28]
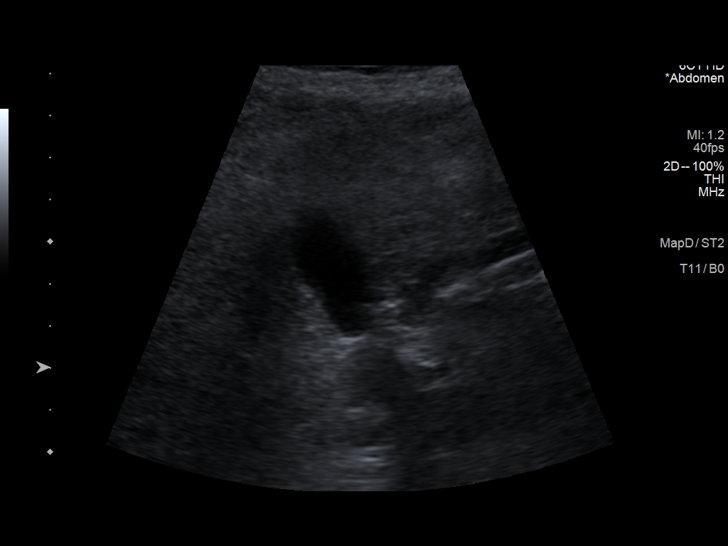
[im 7/28]
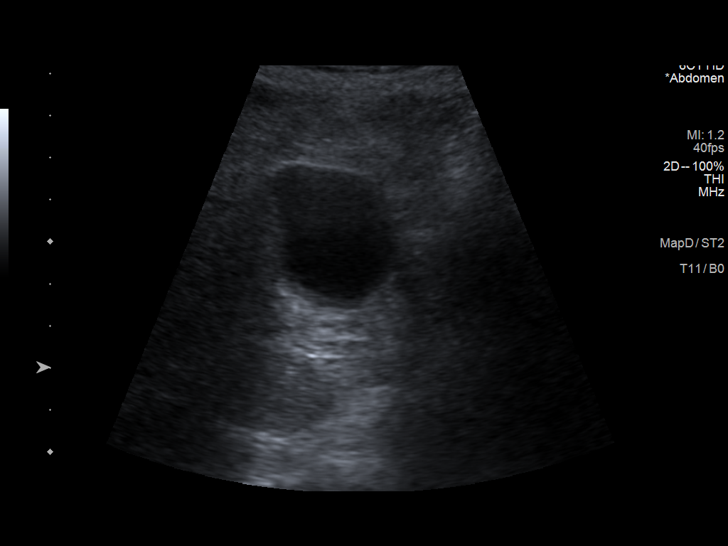
[im 10/28]
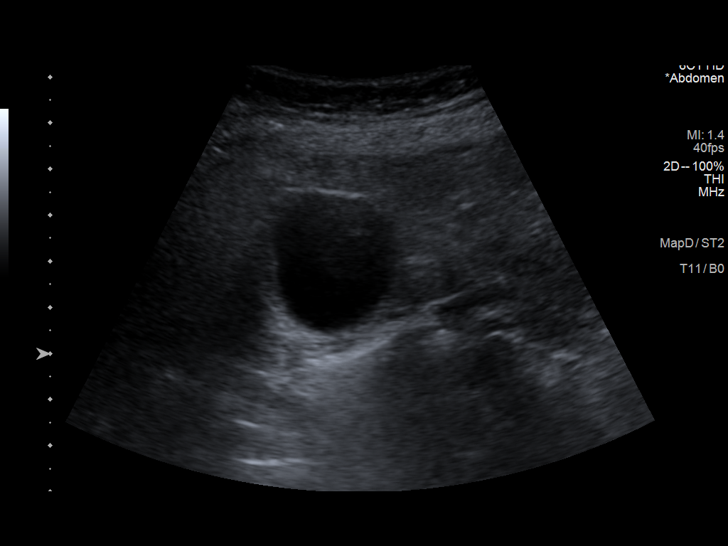
[im 11/28]
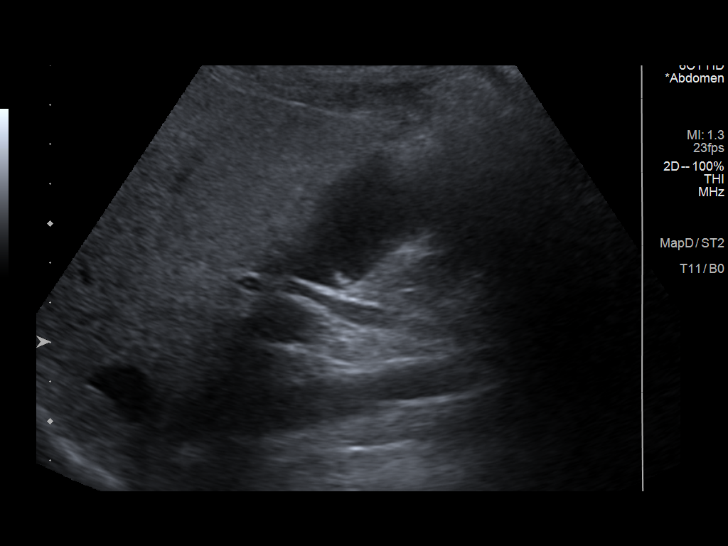
[im 13/28]
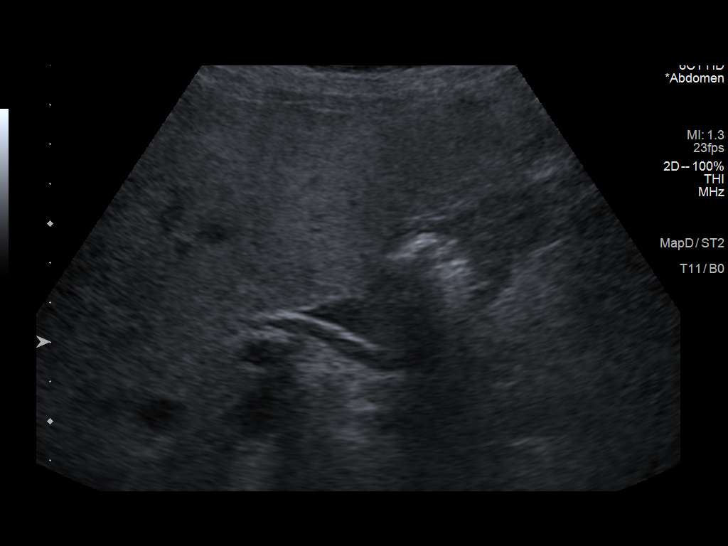
[im 15/28]
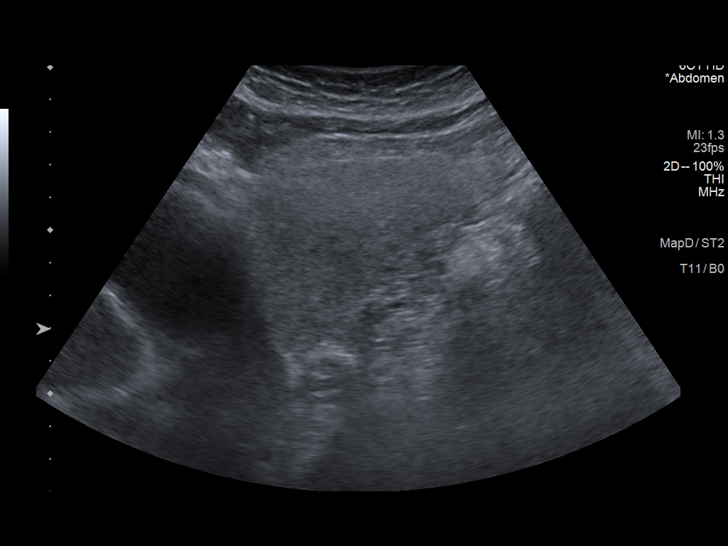
[im 17/28]
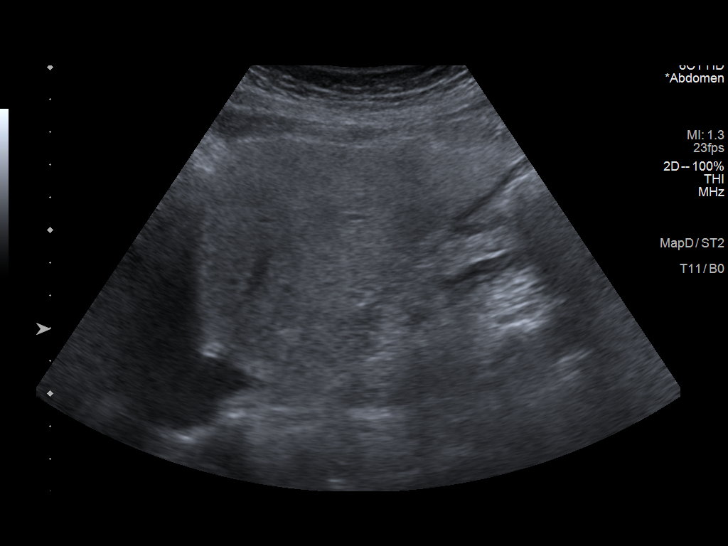
[im 19/28]
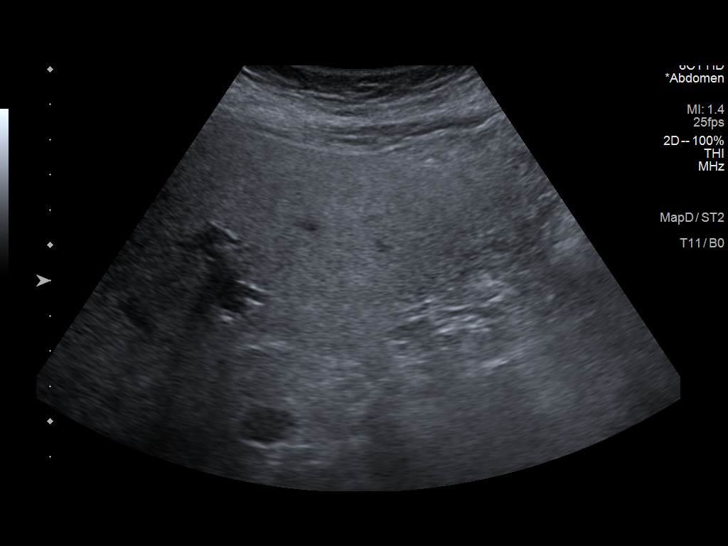
[im 21/28]
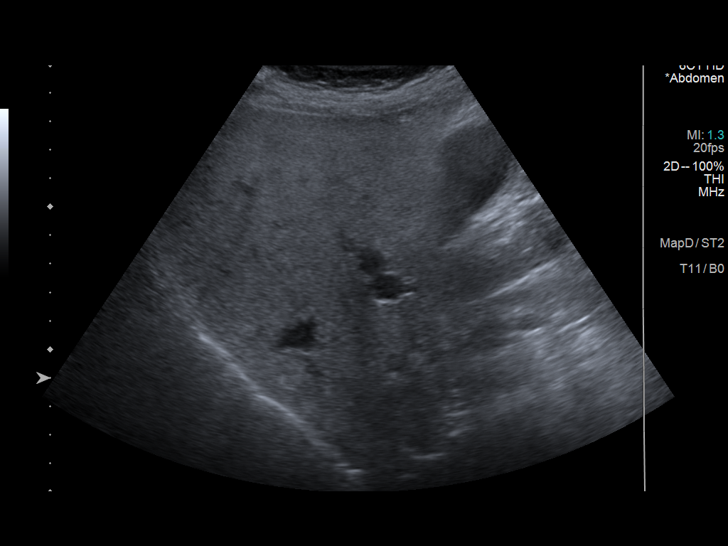
[im 23/28]
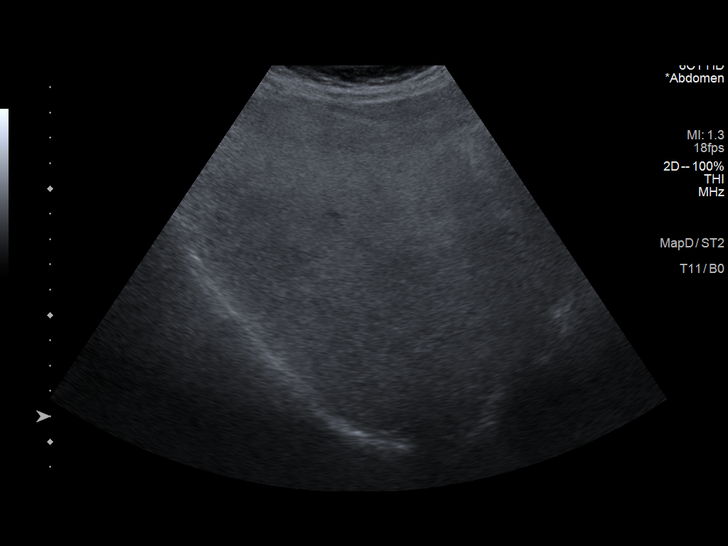
[im 25/28]
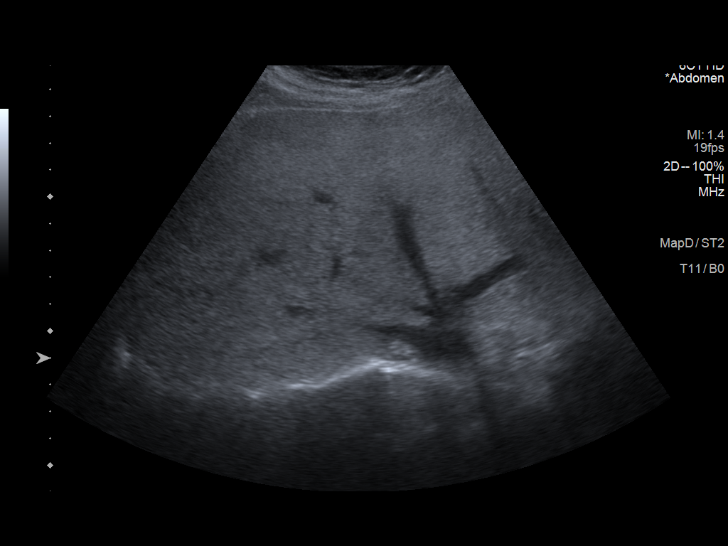
[im 28/28]
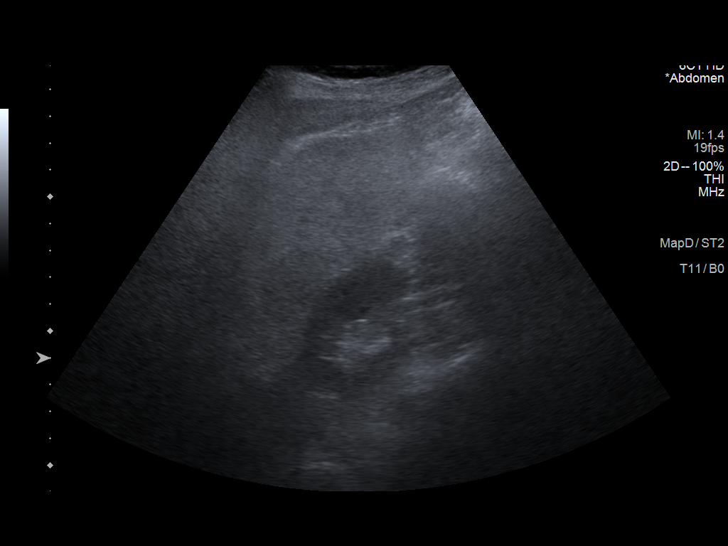

[14 of 25 positions shown; findings below may reference images not displayed]

FINDINGS: Gallbladder:

No gallstones or wall thickening visualized. No sonographic Murphy
sign noted by sonographer.

Common bile duct:

Diameter: 5 mm

Liver:

Coarse, increased echogenicity of the liver parenchyma with no focal
mass identified. Portal vein is patent on color Doppler imaging with
normal direction of blood flow towards the liver.

Other: None.
IMPRESSION: Abnormal appearance of the liver parenchyma suggesting hepatic
steatosis and/or other hepatocellular disease.

## 2023-09-09 NOTE — Progress Notes (Signed)
Cardiology Clinic Note   Date: 09/14/2023 ID: Danysha, Worth 1950/10/23, MRN [id number]  Primary Cardiologist:  Julien Nordmann, MD  Patient Profile    Wanda Clayton is a 72 y.o. female who presents to the clinic today for pre-op evaluation.     Past medical history significant for: Coronary artery calcification. CT chest (lung cancer screening) 11/10/2022: Aortic atherosclerosis.  Three-vessel coronary artery calcification. Carotid artery stenosis. Carotid duplex 05/04/2023: Moderate bilateral atherosclerotic plaque, L>R, resulting in hemodynamically significant stenosis within either ICA.  Retrograde flow within the left vertebral artery changed compared to March 2021, nonspecific though could be seen in the setting of subclavian steal.  Correlation for posterior fossa symptoms please follow-up asymmetry between bilateral upper extremity blood pressure measurements as advised.  Pain.  Blood within the right vertebral artery. PAD. Vascular ultrasound aorta/IVC/iliacs and ABI 08/05/2023: Right ABI indicates severe right lower extremity arterial disease.  Left ABI indicates moderate left lower extremity arterial disease.  Abnormal TBI bilaterally.  No evidence of AAA.  Right external iliac > 50% stenosis. Abdominal aortogram 08/18/2023: No intervention performed.  Recommendation for bilateral femoral endarterectomies with angioplasty and stent placement of the superficial femoral arteries bilaterally. Hypertension. Hyperlipidemia. Lipid panel 01/08/2022: LDL 56, HDL 98, TG 192, total 192. Chronic cerebral ischemia. MRI brain/IAC 05/06/2023: No evidence of acute intracranial abnormality.  No cerebellopontine angle or internal auditory canal mass.  Chronic small vessel ischemic changes within the cerebral white matter moderate for age and progressed from prior brain MRI October 2019.  Mild generalized cerebral atrophy.  Paranasal sinus disease. Tobacco abuse. GAD.  In summary, patient was  first evaluated by Dr. Mariah Milling on 02/22/2018 for hypertension at the request of Rennie Plowman, FNP.  She has a history of PAD, carotid artery disease, chronic cerebral ischemia for which she is followed by vascular surgery.     History of Present Illness    Wanda Clayton is followed by Dr. Mariah Milling for the above outlined history.  Patient was last seen in the office by Dr. Mariah Milling on 10/28/2022 for routine follow-up.  She was doing well at that time and no changes were made.  Today, patient presents for pre-op evaluation. She is pending vascular surgery with Dr. Gilda Crease. Patient denies lower extremity edema, orthopnea or PND. No chest pain, pressure, or tightness. No palpitations. She has baseline shortness of breath that is unchanged. Her activity is limited secondary to bilateral leg and hip pain. She is independent with ADLs and is able to perform light to moderate household activities.      ROS: All other systems reviewed and are otherwise negative except as noted in History of Present Illness.  Studies Reviewed    EKG Interpretation Date/Time:  Monday September 14 2023 09:06:51 EST Ventricular Rate:  93 PR Interval:  136 QRS Duration:  80 QT Interval:  366 QTC Calculation: 455 R Axis:   46  Text Interpretation: Normal sinus rhythm Normal ECG Previous ECG 04/15/2023 no significant change Confirmed by Carlos Levering [phone]) on 09/14/2023 9:16:46 AM       Physical Exam    VS:  BP 135/65 (BP Location: Right Arm, Patient Position: Sitting, Cuff Size: Normal)   Pulse 93   Ht 5\' 3"  (1.6 m)   Wt 132 lb 12.8 oz (60.2 kg)   SpO2 98%   BMI 23.52 kg/m  , BMI Body mass index is 23.52 kg/m.  GEN: Well nourished, well developed, in no acute distress. Neck: No JVD or carotid bruits.  Cardiac: RRR. No murmurs. No rubs or gallops.   Respiratory:  Respirations regular and unlabored. Slightly diminished breath sounds at bases without rales, wheezing or rhonchi. GI: Soft, nontender,  nondistended. Extremities: Radials 2+ and equal bilaterally. DP/PT pulses no palpable. No clubbing or cyanosis. No edema.  Skin: Warm and dry, no rash. Neuro: Strength intact.  Assessment & Plan   Coronary artery calcification Seen on CT chest for lung cancer screening January 2024.  Patient denies chest pain, pressure or tightness.  -Continue aspirin, atorvastatin, Zetia.  PAD ABI October 2024 showed severe right lower extremity arterial disease and moderate left lower extremity arterial disease.  Abdominal aortogram 08/18/2023 without intervention secondary to patient requiring arterial reconstruction, bilateral femoral endarterectomies with bilateral SFA stenting.  Patient reports bilateral hip and extremity pain that limits her activity. PT/DP pulses not palpable.  -Continue to follow with vascular surgery. -Continue aspirin, atorvastatin, Zetia.  Hypertension BP today 135/65. No dizziness or headaches reported.  -Continue amlodipine.  Hyperlipidemia LDL March 2023 56, at goal. -Continue atorvastatin, Zetia.  Tobacco abuse Patient continues to smoke less than a pack per day. She would like to quit but is finding it difficult to do so secondary to increased stress.  -Continue to cut back until complete cessation.   Preoperative cardiovascular risk assessment Patient is pending lower extremity vascular surgery.  -According to the RCRI, patient has a 0.9% risk of MACE. Patient reports activity equivalent to 4.4 METS (per DASI).  -Based on ACC/AHA guidelines, DEISY FRICK would be at acceptable risk for the planned procedure without further cardiovascular testing.   Disposition: Return in 1 year or sooner as needed.          Signed, Etta Grandchild. Kanin Lia, DNP, NP-C

## 2023-09-14 ENCOUNTER — Encounter: Payer: Self-pay | Admitting: Student

## 2023-09-14 ENCOUNTER — Ambulatory Visit: Payer: No Typology Code available for payment source | Attending: Student | Admitting: Student

## 2023-09-14 VITALS — BP 135/65 | HR 93 | Ht 63.0 in | Wt 132.8 lb

## 2023-09-14 DIAGNOSIS — E785 Hyperlipidemia, unspecified: Secondary | ICD-10-CM | POA: Diagnosis not present

## 2023-09-14 DIAGNOSIS — Z01810 Encounter for preprocedural cardiovascular examination: Secondary | ICD-10-CM | POA: Diagnosis not present

## 2023-09-14 DIAGNOSIS — I10 Essential (primary) hypertension: Secondary | ICD-10-CM | POA: Diagnosis not present

## 2023-09-14 DIAGNOSIS — Z720 Tobacco use: Secondary | ICD-10-CM

## 2023-09-14 DIAGNOSIS — I2510 Atherosclerotic heart disease of native coronary artery without angina pectoris: Secondary | ICD-10-CM | POA: Diagnosis not present

## 2023-09-14 DIAGNOSIS — I739 Peripheral vascular disease, unspecified: Secondary | ICD-10-CM | POA: Diagnosis not present

## 2023-09-14 NOTE — Patient Instructions (Signed)
Medication Instructions:  No changes *If you need a refill on your cardiac medications before your next appointment, please call your pharmacy*   Lab Work: None ordered If you have labs (blood work) drawn today and your tests are completely normal, you will receive your results only by: MyChart Message (if you have MyChart) OR A paper copy in the mail If you have any lab test that is abnormal or we need to change your treatment, we will call you to review the results.   Testing/Procedures: None ordered   Follow-Up: At [facility], you and your health needs are our priority.  As part of our continuing mission to provide you with exceptional heart care, we have created designated Provider Care Teams.  These Care Teams include your primary Cardiologist (physician) and Advanced Practice Providers (APPs -  Physician Assistants and Nurse Practitioners) who all work together to provide you with the care you need, when you need it.  We recommend signing up for the patient portal called "MyChart".  Sign up information is provided on this After Visit Summary.  MyChart is used to connect with patients for Virtual Visits (Telemedicine).  Patients are able to view lab/test results, encounter notes, upcoming appointments, etc.  Non-urgent messages can be sent to your provider as well.   To learn more about what you can do with MyChart, go to [email or web address].    Your next appointment:   12 month(s)  Provider:   You may see Julien Nordmann, MD or one of the following Advanced Practice Providers on your designated Care Team:   Carlos Levering, NP

## 2023-09-15 ENCOUNTER — Encounter
Admission: RE | Admit: 2023-09-15 | Discharge: 2023-09-15 | Disposition: A | Discharge status: Home / Self Care | Payer: No Typology Code available for payment source | Source: Ambulatory Visit | Attending: Vascular Surgery | Admitting: Vascular Surgery

## 2023-09-15 ENCOUNTER — Other Ambulatory Visit (INDEPENDENT_AMBULATORY_CARE_PROVIDER_SITE_OTHER): Payer: Self-pay | Admitting: Nurse Practitioner

## 2023-09-15 ENCOUNTER — Other Ambulatory Visit: Payer: Self-pay

## 2023-09-15 VITALS — Ht 63.0 in | Wt 130.0 lb

## 2023-09-15 DIAGNOSIS — Z01812 Encounter for preprocedural laboratory examination: Secondary | ICD-10-CM | POA: Insufficient documentation

## 2023-09-15 DIAGNOSIS — I70223 Atherosclerosis of native arteries of extremities with rest pain, bilateral legs: Secondary | ICD-10-CM | POA: Insufficient documentation

## 2023-09-15 DIAGNOSIS — Z01818 Encounter for other preprocedural examination: Secondary | ICD-10-CM

## 2023-09-15 HISTORY — DX: Peripheral vascular disease, unspecified: I73.9

## 2023-09-15 HISTORY — DX: Atherosclerotic heart disease of native coronary artery without angina pectoris: I25.10

## 2023-09-15 HISTORY — DX: Dyspnea, unspecified: R06.00

## 2023-09-15 LAB — SURGICAL PCR SCREEN
MRSA, PCR: NEGATIVE
Staphylococcus aureus: NEGATIVE

## 2023-09-15 LAB — BASIC METABOLIC PANEL
Anion gap: 9 (ref 5–15)
BUN: 9 mg/dL (ref 8–23)
CO2: 25 mmol/L (ref 22–32)
Calcium: 8.8 mg/dL — ABNORMAL LOW (ref 8.9–10.3)
Chloride: 103 mmol/L (ref 98–111)
Creatinine, Ser: 0.52 mg/dL (ref 0.44–1.00)
GFR, Estimated: >60 mL/min (ref >60)
Glucose, Bld: 83 mg/dL (ref 70–99)
Potassium: 3.8 mmol/L (ref 3.5–5.1)
Sodium: 137 mmol/L (ref 135–145)

## 2023-09-15 LAB — TYPE AND SCREEN
ABO/RH(D): A POS
Antibody Screen: NEGATIVE

## 2023-09-15 MED ORDER — CEFAZOLIN SODIUM-DEXTROSE 2-4 GM/100ML-% IV SOLN
2.0000 g | INTRAVENOUS | Status: AC
  Administered 2023-09-16: 2 g via INTRAVENOUS
  Administered 2023-09-16 (×2): 1 g via INTRAVENOUS

## 2023-09-15 MED ORDER — CHLORHEXIDINE GLUCONATE CLOTH 2 % EX PADS: 6.0000 | MEDICATED_PAD | Freq: Once | CUTANEOUS | Status: DC

## 2023-09-15 MED ORDER — CHLORHEXIDINE GLUCONATE CLOTH 2 % EX PADS
6.0000 | MEDICATED_PAD | Freq: Once | CUTANEOUS | Status: AC
  Administered 2023-09-16: 6 via TOPICAL

## 2023-09-15 MED ORDER — CHLORHEXIDINE GLUCONATE 0.12 % MT SOLN
15.0000 mL | Freq: Once | OROMUCOSAL | Status: AC
  Administered 2023-09-16: 15 mL via OROMUCOSAL

## 2023-09-15 MED ORDER — LACTATED RINGERS IV SOLN: INTRAVENOUS | Status: DC

## 2023-09-15 MED ORDER — ORAL CARE MOUTH RINSE
15.0000 mL | Freq: Once | OROMUCOSAL | Status: AC
Start: 2023-09-15 — End: 2023-09-16

## 2023-09-15 NOTE — Patient Instructions (Signed)
Your procedure is scheduled on: 09/16/23 To find out your arrival time, please call [phone] between 1PM - 3PM on:   09/15/23 Report to the Registration Desk on the 1st floor of the [facility]. Free Valet parking is available.  If your arrival time is 6:00 am, do not arrive before that time as the [facility] entrance doors do not open until 6:00 am.  REMEMBER: Instructions that are not followed completely may result in serious medical risk, up to and including death; or upon the discretion of your surgeon and anesthesiologist your surgery may need to be rescheduled.  Do not eat food or drink any liquids after midnight the night before surgery.  No gum chewing or hard candies.  One week prior to surgery: Stop Anti-inflammatories (NSAIDS) such as Advil, Aleve, Ibuprofen, Motrin, Naproxen, Naprosyn and Aspirin based products such as Excedrin, Goody's Powder, BC Powder. YOu can take Aspirin today 12/3 but hold day of surgery 12/4 You may however, continue to take Tylenol if needed for pain up until the day of surgery.  Stop ANY OVER THE COUNTER supplements and vitamins until after surgery.  Continue taking all prescribed medications.   TAKE ONLY THESE MEDICATIONS THE MORNING OF SURGERY WITH A SIP OF WATER:  amLODipine (NORVASC) 5 MG tablet  ezetimibe (ZETIA) 10 MG tablet   No Alcohol for 24 hours before or after surgery.  No Smoking including e-cigarettes for 24 hours before surgery.  No chewable tobacco products for at least 6 hours before surgery.  No nicotine patches on the day of surgery.  Do not use any "recreational" drugs for at least a week (preferably 2 weeks) before your surgery.  Please be advised that the combination of cocaine and anesthesia may have negative outcomes, up to and including death. If you test positive for cocaine, your surgery will be cancelled.  On the morning of surgery brush your teeth with toothpaste and water, you may rinse your mouth with  mouthwash if you wish. Do not swallow any toothpaste or mouthwash.  Use CHG Soap or wipes as directed on instruction sheet.  Do not wear lotions, powders, or perfumes.   Do not shave body hair from the neck down 48 hours before surgery.  Wear comfortable clothing (specific to your surgery type) to the hospital.  Do not wear jewelry, make-up, hairpins, clips or nail polish.  For welded (permanent) jewelry: bracelets, anklets, waist bands, etc.  Please have this removed prior to surgery.  If it is not removed, there is a chance that hospital personnel will need to cut it off on the day of surgery. Contact lenses, hearing aids and dentures may not be worn into surgery.  Do not bring valuables to the hospital. [facility] is not responsible for any missing/lost belongings or valuables.   Notify your doctor if there is any change in your medical condition (cold, fever, infection).  If you are being discharged the day of surgery, you will not be allowed to drive home. You will need a responsible individual to drive you home and stay with you for 24 hours after surgery.   If you are taking public transportation, you will need to have a responsible individual with you.  If you are being admitted to the hospital overnight, leave your suitcase in the car. After surgery it may be brought to your room.  In case of increased patient census, it may be necessary for you, the patient, to continue your postoperative care in the Same Day  Surgery department.  After surgery, you can help prevent lung complications by doing breathing exercises.  Take deep breaths and cough every 1-2 hours. Your doctor may order a device called an Incentive Spirometer to help you take deep breaths. When coughing or sneezing, hold a pillow firmly against your incision with both hands. This is called "splinting." Doing this helps protect your incision. It also decreases belly discomfort.  Surgery Visitation  Policy:  Patients undergoing a surgery or procedure may have two family members or support persons with them as long as the person is not COVID-19 positive or experiencing its symptoms.   Inpatient Visitation:    Visiting hours are 7 a.m. to 8 p.m. Up to four visitors are allowed at one time in a patient room. The visitors may rotate out with other people during the day. One designated support person (adult) may remain overnight.  Please call the Pre-admissions Testing Dept. at [phone] if you have any questions about these instructions.     Preparing for Surgery with CHLORHEXIDINE GLUCONATE (CHG) Soap  Chlorhexidine Gluconate (CHG) Soap  o An antiseptic cleaner that kills germs and bonds with the skin to continue killing germs even after washing  o Used for showering the night before surgery and morning of surgery  Before surgery, you can play an important role by reducing the number of germs on your skin.  CHG (Chlorhexidine gluconate) soap is an antiseptic cleanser which kills germs and bonds with the skin to continue killing germs even after washing.  Please do not use if you have an allergy to CHG or antibacterial soaps. If your skin becomes reddened/irritated stop using the CHG.  1. Shower the NIGHT BEFORE SURGERY and the MORNING OF SURGERY with CHG soap.  2. If you choose to wash your hair, wash your hair first as usual with your normal shampoo.  3. After shampooing, rinse your hair and body thoroughly to remove the shampoo.  4. Use CHG as you would any other liquid soap. You can apply CHG directly to the skin and wash gently with a scrungie or a clean washcloth.  5. Apply the CHG soap to your body only from the neck down. Do not use on open wounds or open sores. Avoid contact with your eyes, ears, mouth, and genitals (private parts). Wash face and genitals (private parts) with your normal soap.  6. Wash thoroughly, paying special attention to the area where your  surgery will be performed.  7. Thoroughly rinse your body with warm water.  8. Do not shower/wash with your normal soap after using and rinsing off the CHG soap.  9. Pat yourself dry with a clean towel.  10. Wear clean pajamas to bed the night before surgery.  12. Place clean sheets on your bed the night of your first shower and do not sleep with pets.  13. Shower again with the CHG soap on the day of surgery prior to arriving at the hospital.  14. Do not apply any deodorants/lotions/powders.  15. Please wear clean clothes to the hospital.

## 2023-09-16 ENCOUNTER — Other Ambulatory Visit: Payer: Self-pay

## 2023-09-16 ENCOUNTER — Inpatient Hospital Stay: Payer: Self-pay | Admitting: Certified Registered"

## 2023-09-16 ENCOUNTER — Inpatient Hospital Stay
Admission: RE | Admit: 2023-09-16 | Discharge: 2023-09-19 | DRG: 254 | Disposition: A | Discharge status: Home / Self Care | Payer: No Typology Code available for payment source | Attending: Vascular Surgery | Admitting: Vascular Surgery

## 2023-09-16 ENCOUNTER — Inpatient Hospital Stay: Payer: No Typology Code available for payment source

## 2023-09-16 ENCOUNTER — Inpatient Hospital Stay: Payer: No Typology Code available for payment source | Admitting: Certified Registered"

## 2023-09-16 ENCOUNTER — Encounter: Payer: Self-pay | Admitting: Vascular Surgery

## 2023-09-16 ENCOUNTER — Encounter
Admission: RE | Disposition: A | Discharge status: Home / Self Care | Payer: Self-pay | Source: Home / Self Care | Attending: Vascular Surgery

## 2023-09-16 DIAGNOSIS — I70223 Atherosclerosis of native arteries of extremities with rest pain, bilateral legs: Secondary | ICD-10-CM

## 2023-09-16 DIAGNOSIS — I70229 Atherosclerosis of native arteries of extremities with rest pain, unspecified extremity: Principal | ICD-10-CM | POA: Diagnosis present

## 2023-09-16 DIAGNOSIS — I6523 Occlusion and stenosis of bilateral carotid arteries: Secondary | ICD-10-CM | POA: Diagnosis not present

## 2023-09-16 DIAGNOSIS — E1151 Type 2 diabetes mellitus with diabetic peripheral angiopathy without gangrene: Secondary | ICD-10-CM | POA: Diagnosis not present

## 2023-09-16 DIAGNOSIS — Z800 Family history of malignant neoplasm of digestive organs: Secondary | ICD-10-CM

## 2023-09-16 DIAGNOSIS — Z833 Family history of diabetes mellitus: Secondary | ICD-10-CM | POA: Diagnosis not present

## 2023-09-16 DIAGNOSIS — Z8249 Family history of ischemic heart disease and other diseases of the circulatory system: Secondary | ICD-10-CM

## 2023-09-16 DIAGNOSIS — Z825 Family history of asthma and other chronic lower respiratory diseases: Secondary | ICD-10-CM

## 2023-09-16 DIAGNOSIS — I70201 Unspecified atherosclerosis of native arteries of extremities, right leg: Secondary | ICD-10-CM | POA: Diagnosis not present

## 2023-09-16 DIAGNOSIS — E782 Mixed hyperlipidemia: Secondary | ICD-10-CM | POA: Diagnosis not present

## 2023-09-16 DIAGNOSIS — Z7902 Long term (current) use of antithrombotics/antiplatelets: Secondary | ICD-10-CM

## 2023-09-16 DIAGNOSIS — Z820 Family history of epilepsy and other diseases of the nervous system: Secondary | ICD-10-CM

## 2023-09-16 DIAGNOSIS — I2510 Atherosclerotic heart disease of native coronary artery without angina pectoris: Secondary | ICD-10-CM | POA: Diagnosis present

## 2023-09-16 DIAGNOSIS — Z79899 Other long term (current) drug therapy: Secondary | ICD-10-CM

## 2023-09-16 DIAGNOSIS — Z9889 Other specified postprocedural states: Secondary | ICD-10-CM | POA: Diagnosis not present

## 2023-09-16 DIAGNOSIS — F17210 Nicotine dependence, cigarettes, uncomplicated: Secondary | ICD-10-CM | POA: Diagnosis present

## 2023-09-16 DIAGNOSIS — Z7982 Long term (current) use of aspirin: Secondary | ICD-10-CM

## 2023-09-16 DIAGNOSIS — Z91048 Other nonmedicinal substance allergy status: Secondary | ICD-10-CM

## 2023-09-16 DIAGNOSIS — I10 Essential (primary) hypertension: Secondary | ICD-10-CM

## 2023-09-16 DIAGNOSIS — I70202 Unspecified atherosclerosis of native arteries of extremities, left leg: Secondary | ICD-10-CM | POA: Diagnosis not present

## 2023-09-16 HISTORY — PX: ENDARTERECTOMY FEMORAL: SHX5804

## 2023-09-16 LAB — GLUCOSE, CAPILLARY
Glucose-Capillary: 216 mg/dL — ABNORMAL HIGH (ref 70–99)
Glucose-Capillary: 176 mg/dL — ABNORMAL HIGH (ref 70–99)

## 2023-09-16 LAB — CBC
HCT: 33.9 % — ABNORMAL LOW (ref 36.0–46.0)
Hemoglobin: 11.5 g/dL — ABNORMAL LOW (ref 12.0–15.0)
MCH: 33.3 pg (ref 26.0–34.0)
MCHC: 33.9 g/dL (ref 30.0–36.0)
MCV: 98.3 fL (ref 80.0–100.0)
Platelets: 197 10*3/uL (ref 150–400)
RBC: 3.45 MIL/uL — ABNORMAL LOW (ref 3.87–5.11)
RDW: 13 % (ref 11.5–15.5)
WBC: 9 10*3/uL (ref 4.0–10.5)
nRBC: 0 % (ref 0.0–0.2)

## 2023-09-16 LAB — CREATININE, SERUM
Creatinine, Ser: 0.58 mg/dL (ref 0.44–1.00)
GFR, Estimated: >60 mL/min (ref >60)

## 2023-09-16 LAB — MRSA NEXT GEN BY PCR, NASAL: MRSA by PCR Next Gen: NOT DETECTED

## 2023-09-16 SURGERY — ENDARTERECTOMY, FEMORAL
Anesthesia: General

## 2023-09-16 MED ORDER — ACETAMINOPHEN 10 MG/ML IV SOLN
INTRAVENOUS | Status: AC
  Filled 2023-09-16: qty 100

## 2023-09-16 MED ORDER — ACETAMINOPHEN 650 MG RE SUPP: 325.0000 mg | RECTAL | Status: DC | PRN

## 2023-09-16 MED ORDER — ALBUMIN HUMAN 5 % IV SOLN
INTRAVENOUS | Status: AC
  Filled 2023-09-16: qty 500

## 2023-09-16 MED ORDER — HYDROMORPHONE HCL 1 MG/ML IJ SOLN
INTRAMUSCULAR | Status: AC
  Filled 2023-09-16: qty 1

## 2023-09-16 MED ORDER — HYDROMORPHONE HCL 1 MG/ML IJ SOLN: 1.0000 mg | Freq: Once | INTRAMUSCULAR | Status: DC | PRN

## 2023-09-16 MED ORDER — CLOPIDOGREL BISULFATE 75 MG PO TABS
75.0000 mg | ORAL_TABLET | Freq: Every day | ORAL | Status: DC
Start: 2023-09-17 — End: 2023-09-19
  Administered 2023-09-17 – 2023-09-19 (×3): 75 mg via ORAL
  Filled 2023-09-16 (×3): qty 1

## 2023-09-16 MED ORDER — PROPOFOL 10 MG/ML IV BOLUS
INTRAVENOUS | Status: AC
  Filled 2023-09-16: qty 20

## 2023-09-16 MED ORDER — MAGNESIUM SULFATE 2 GM/50ML IV SOLN: 2.0000 g | Freq: Every day | INTRAVENOUS | Status: DC | PRN

## 2023-09-16 MED ORDER — ESMOLOL HCL 100 MG/10ML IV SOLN
INTRAVENOUS | Status: DC | PRN
  Administered 2023-09-16: 50 mg via INTRAVENOUS

## 2023-09-16 MED ORDER — CEFAZOLIN SODIUM 1 G IJ SOLR
INTRAMUSCULAR | Status: AC
  Filled 2023-09-16: qty 10

## 2023-09-16 MED ORDER — VANCOMYCIN HCL 1000 MG IV SOLR
INTRAVENOUS | Status: AC
Start: 2023-09-16 — End: ?
  Filled 2023-09-16: qty 20

## 2023-09-16 MED ORDER — DOCUSATE SODIUM 100 MG PO CAPS
100.0000 mg | ORAL_CAPSULE | Freq: Every day | ORAL | Status: DC
Start: 2023-09-17 — End: 2023-09-19
  Administered 2023-09-17 – 2023-09-19 (×3): 100 mg via ORAL
  Filled 2023-09-16 (×3): qty 1

## 2023-09-16 MED ORDER — ROCURONIUM BROMIDE 10 MG/ML (PF) SYRINGE
PREFILLED_SYRINGE | INTRAVENOUS | Status: AC
Start: 2023-09-16 — End: ?
  Filled 2023-09-16: qty 10

## 2023-09-16 MED ORDER — CEFAZOLIN SODIUM-DEXTROSE 2-4 GM/100ML-% IV SOLN
2.0000 g | Freq: Three times a day (TID) | INTRAVENOUS | Status: AC
  Administered 2023-09-16 – 2023-09-17 (×2): 2 g via INTRAVENOUS
  Filled 2023-09-16 (×2): qty 100

## 2023-09-16 MED ORDER — ENOXAPARIN SODIUM 40 MG/0.4ML IJ SOSY
40.0000 mg | PREFILLED_SYRINGE | INTRAMUSCULAR | Status: DC
  Administered 2023-09-17 – 2023-09-19 (×3): 40 mg via SUBCUTANEOUS
  Filled 2023-09-16 (×3): qty 0.4

## 2023-09-16 MED ORDER — GENTAMICIN SULFATE 40 MG/ML IJ SOLN
INTRAMUSCULAR | Status: DC | PRN
  Administered 2023-09-16: 80 mg

## 2023-09-16 MED ORDER — PHENYLEPHRINE 80 MCG/ML (10ML) SYRINGE FOR IV PUSH (FOR BLOOD PRESSURE SUPPORT)
PREFILLED_SYRINGE | INTRAVENOUS | Status: DC | PRN
  Administered 2023-09-16: 80 ug via INTRAVENOUS
  Administered 2023-09-16: 160 ug via INTRAVENOUS
  Administered 2023-09-16: 80 ug via INTRAVENOUS

## 2023-09-16 MED ORDER — DEXAMETHASONE SODIUM PHOSPHATE 10 MG/ML IJ SOLN
INTRAMUSCULAR | Status: DC | PRN
  Administered 2023-09-16: 10 mg via INTRAVENOUS

## 2023-09-16 MED ORDER — PROPOFOL 10 MG/ML IV BOLUS
INTRAVENOUS | Status: DC | PRN
  Administered 2023-09-16: 150 mg via INTRAVENOUS

## 2023-09-16 MED ORDER — CHLORHEXIDINE GLUCONATE 0.12 % MT SOLN
OROMUCOSAL | Status: AC
  Filled 2023-09-16: qty 15

## 2023-09-16 MED ORDER — ALBUMIN HUMAN 5 % IV SOLN
INTRAVENOUS | Status: DC | PRN
Start: 2023-09-16 — End: 2023-09-16

## 2023-09-16 MED ORDER — SENNOSIDES-DOCUSATE SODIUM 8.6-50 MG PO TABS: 1.0000 | ORAL_TABLET | Freq: Every evening | ORAL | Status: DC | PRN

## 2023-09-16 MED ORDER — ONDANSETRON HCL 4 MG/2ML IJ SOLN
INTRAMUSCULAR | Status: AC
  Filled 2023-09-16: qty 2

## 2023-09-16 MED ORDER — MORPHINE SULFATE (PF) 2 MG/ML IV SOLN
2.0000 mg | INTRAVENOUS | Status: DC | PRN
  Administered 2023-09-16 – 2023-09-18 (×5): 2 mg via INTRAVENOUS
  Filled 2023-09-16 (×5): qty 1

## 2023-09-16 MED ORDER — ONDANSETRON HCL 4 MG/2ML IJ SOLN: 4.0000 mg | Freq: Four times a day (QID) | INTRAMUSCULAR | Status: DC | PRN

## 2023-09-16 MED ORDER — ROCURONIUM BROMIDE 100 MG/10ML IV SOLN
INTRAVENOUS | Status: DC | PRN
  Administered 2023-09-16: 50 mg via INTRAVENOUS
  Administered 2023-09-16: 10 mg via INTRAVENOUS
  Administered 2023-09-16: 50 mg via INTRAVENOUS
  Administered 2023-09-16: 40 mg via INTRAVENOUS

## 2023-09-16 MED ORDER — HEPARIN SODIUM (PORCINE) 1000 UNIT/ML IJ SOLN
INTRAMUSCULAR | Status: DC | PRN
  Administered 2023-09-16: 2000 [IU] via INTRAVENOUS
  Administered 2023-09-16: 6000 [IU] via INTRAVENOUS

## 2023-09-16 MED ORDER — HYDRALAZINE HCL 20 MG/ML IJ SOLN: 5.0000 mg | INTRAMUSCULAR | Status: DC | PRN

## 2023-09-16 MED ORDER — GLYCOPYRROLATE 0.2 MG/ML IJ SOLN
INTRAMUSCULAR | Status: DC | PRN
  Administered 2023-09-16: .2 mg via INTRAVENOUS

## 2023-09-16 MED ORDER — SODIUM CHLORIDE 0.9 % IR SOLN
Status: DC | PRN
  Administered 2023-09-16: 500 mL

## 2023-09-16 MED ORDER — FENTANYL CITRATE (PF) 100 MCG/2ML IJ SOLN
INTRAMUSCULAR | Status: AC
  Filled 2023-09-16: qty 2

## 2023-09-16 MED ORDER — ONDANSETRON HCL 4 MG/2ML IJ SOLN
INTRAMUSCULAR | Status: DC | PRN
  Administered 2023-09-16: 4 mg via INTRAVENOUS

## 2023-09-16 MED ORDER — OXYCODONE HCL 5 MG/5ML PO SOLN: 5.0000 mg | Freq: Once | ORAL | Status: DC | PRN

## 2023-09-16 MED ORDER — VANCOMYCIN HCL 1 G IV SOLR
INTRAVENOUS | Status: DC | PRN
  Administered 2023-09-16: 1000 mg via TOPICAL

## 2023-09-16 MED ORDER — OXYCODONE HCL 5 MG PO TABS: 5.0000 mg | ORAL_TABLET | Freq: Once | ORAL | Status: DC | PRN

## 2023-09-16 MED ORDER — ACETAMINOPHEN 10 MG/ML IV SOLN: 1000.0000 mg | Freq: Once | INTRAVENOUS | Status: DC | PRN

## 2023-09-16 MED ORDER — SODIUM CHLORIDE 0.9 % IV SOLN
500.0000 mL | Freq: Once | INTRAVENOUS | Status: DC | PRN
Start: 2023-09-16 — End: 2023-09-19

## 2023-09-16 MED ORDER — OXYCODONE-ACETAMINOPHEN 5-325 MG PO TABS
1.0000 | ORAL_TABLET | ORAL | Status: DC | PRN
  Administered 2023-09-18: 1 via ORAL
  Filled 2023-09-16: qty 1

## 2023-09-16 MED ORDER — PHENOL 1.4 % MT LIQD: 1.0000 | OROMUCOSAL | Status: DC | PRN

## 2023-09-16 MED ORDER — ASPIRIN 81 MG PO TBEC
81.0000 mg | DELAYED_RELEASE_TABLET | Freq: Every day | ORAL | Status: DC
  Administered 2023-09-16 – 2023-09-19 (×4): 81 mg via ORAL
  Filled 2023-09-16 (×4): qty 1

## 2023-09-16 MED ORDER — EZETIMIBE 10 MG PO TABS
10.0000 mg | ORAL_TABLET | Freq: Every day | ORAL | Status: DC
  Administered 2023-09-17 – 2023-09-19 (×3): 10 mg via ORAL
  Filled 2023-09-16 (×3): qty 1

## 2023-09-16 MED ORDER — AMLODIPINE BESYLATE 5 MG PO TABS
5.0000 mg | ORAL_TABLET | Freq: Every day | ORAL | Status: DC
  Administered 2023-09-17 – 2023-09-19 (×2): 5 mg via ORAL
  Filled 2023-09-16 (×2): qty 1

## 2023-09-16 MED ORDER — HEPARIN 30,000 UNITS/1000 ML (OHS) CELLSAVER SOLUTION
Status: AC | PRN
  Administered 2023-09-16: 1

## 2023-09-16 MED ORDER — ACETAMINOPHEN 10 MG/ML IV SOLN
INTRAVENOUS | Status: DC | PRN
  Administered 2023-09-16: 1000 mg via INTRAVENOUS

## 2023-09-16 MED ORDER — METOPROLOL TARTRATE 5 MG/5ML IV SOLN: 2.0000 mg | INTRAVENOUS | Status: DC | PRN

## 2023-09-16 MED ORDER — HEMOSTATIC AGENTS (NO CHARGE) OPTIME
TOPICAL | Status: DC | PRN
  Administered 2023-09-16: 2 via TOPICAL

## 2023-09-16 MED ORDER — FENTANYL CITRATE (PF) 100 MCG/2ML IJ SOLN
INTRAMUSCULAR | Status: DC | PRN
  Administered 2023-09-16: 50 ug via INTRAVENOUS
  Administered 2023-09-16: 25 ug via INTRAVENOUS
  Administered 2023-09-16 (×2): 50 ug via INTRAVENOUS
  Administered 2023-09-16: 25 ug via INTRAVENOUS

## 2023-09-16 MED ORDER — ACETAMINOPHEN 325 MG PO TABS: 325.0000 mg | ORAL_TABLET | ORAL | Status: DC | PRN

## 2023-09-16 MED ORDER — HEPARIN SODIUM (PORCINE) 5000 UNIT/ML IJ SOLN
INTRAMUSCULAR | Status: AC
Start: 2023-09-16 — End: ?
  Filled 2023-09-16: qty 1

## 2023-09-16 MED ORDER — LABETALOL HCL 5 MG/ML IV SOLN: 10.0000 mg | INTRAVENOUS | Status: DC | PRN

## 2023-09-16 MED ORDER — HYDROMORPHONE HCL 1 MG/ML IJ SOLN
INTRAMUSCULAR | Status: DC | PRN
  Administered 2023-09-16: 1 mg via INTRAVENOUS

## 2023-09-16 MED ORDER — GUAIFENESIN-DM 100-10 MG/5ML PO SYRP: 15.0000 mL | ORAL_SOLUTION | ORAL | Status: DC | PRN

## 2023-09-16 MED ORDER — LIDOCAINE HCL (CARDIAC) PF 100 MG/5ML IV SOSY
PREFILLED_SYRINGE | INTRAVENOUS | Status: DC | PRN
  Administered 2023-09-16: 60 mg via INTRAVENOUS

## 2023-09-16 MED ORDER — PHENYLEPHRINE HCL-NACL 20-0.9 MG/250ML-% IV SOLN
INTRAVENOUS | Status: DC | PRN
Start: 2023-09-16 — End: 2023-09-16
  Administered 2023-09-16 (×2): 20 ug/min via INTRAVENOUS

## 2023-09-16 MED ORDER — HEPARIN 30,000 UNITS/1000 ML (OHS) CELLSAVER SOLUTION
Status: AC
  Filled 2023-09-16: qty 1000

## 2023-09-16 MED ORDER — LORAZEPAM 2 MG/ML IJ SOLN
0.5000 mg | INTRAMUSCULAR | Status: DC | PRN
  Administered 2023-09-16 – 2023-09-18 (×5): 0.5 mg via INTRAVENOUS
  Filled 2023-09-16 (×5): qty 1

## 2023-09-16 MED ORDER — SUGAMMADEX SODIUM 200 MG/2ML IV SOLN
INTRAVENOUS | Status: DC | PRN
  Administered 2023-09-16: 200 mg via INTRAVENOUS

## 2023-09-16 MED ORDER — DROPERIDOL 2.5 MG/ML IJ SOLN: 0.6250 mg | Freq: Once | INTRAMUSCULAR | Status: DC | PRN

## 2023-09-16 MED ORDER — FAMOTIDINE IN NACL 20-0.9 MG/50ML-% IV SOLN
20.0000 mg | Freq: Two times a day (BID) | INTRAVENOUS | Status: DC
  Administered 2023-09-16: 20 mg via INTRAVENOUS
  Filled 2023-09-16 (×2): qty 50

## 2023-09-16 MED ORDER — METOPROLOL TARTRATE 5 MG/5ML IV SOLN
INTRAVENOUS | Status: DC | PRN
  Administered 2023-09-16: 1 mg via INTRAVENOUS
  Administered 2023-09-16 (×2): 2 mg via INTRAVENOUS

## 2023-09-16 MED ORDER — SODIUM CHLORIDE 0.9 % IV SOLN
INTRAVENOUS | Status: DC | PRN
Start: 2023-09-16 — End: 2023-09-16

## 2023-09-16 MED ORDER — SODIUM CHLORIDE 0.9 % IV SOLN: INTRAVENOUS | Status: DC

## 2023-09-16 MED ORDER — LIDOCAINE HCL (PF) 2 % IJ SOLN
INTRAMUSCULAR | Status: AC
  Filled 2023-09-16: qty 5

## 2023-09-16 MED ORDER — SORBITOL 70 % SOLN: 30.0000 mL | Freq: Every day | Status: DC | PRN

## 2023-09-16 MED ORDER — FENTANYL CITRATE (PF) 100 MCG/2ML IJ SOLN
25.0000 ug | INTRAMUSCULAR | Status: DC | PRN
  Administered 2023-09-16: 50 ug via INTRAVENOUS
  Administered 2023-09-16 (×2): 25 ug via INTRAVENOUS

## 2023-09-16 MED ORDER — CEFAZOLIN SODIUM-DEXTROSE 2-4 GM/100ML-% IV SOLN
INTRAVENOUS | Status: AC
  Filled 2023-09-16: qty 100

## 2023-09-16 MED ORDER — GENTAMICIN SULFATE 40 MG/ML IJ SOLN
INTRAMUSCULAR | Status: AC
  Filled 2023-09-16: qty 2

## 2023-09-16 MED ORDER — MIDAZOLAM HCL 2 MG/2ML IJ SOLN
INTRAMUSCULAR | Status: AC
  Filled 2023-09-16: qty 2

## 2023-09-16 MED ORDER — NITROGLYCERIN IN D5W 200-5 MCG/ML-% IV SOLN: 5.0000 ug/min | INTRAVENOUS | Status: DC

## 2023-09-16 MED ORDER — DOPAMINE-DEXTROSE 3.2-5 MG/ML-% IV SOLN: 3.0000 ug/kg/min | INTRAVENOUS | Status: DC

## 2023-09-16 MED ORDER — CHLORHEXIDINE GLUCONATE CLOTH 2 % EX PADS
6.0000 | MEDICATED_PAD | Freq: Every day | CUTANEOUS | Status: DC
Start: 2023-09-16 — End: 2023-09-19
  Administered 2023-09-16 – 2023-09-19 (×4): 6 via TOPICAL

## 2023-09-16 MED ORDER — ALUM & MAG HYDROXIDE-SIMETH 200-200-20 MG/5ML PO SUSP: 15.0000 mL | ORAL | Status: DC | PRN

## 2023-09-16 MED ORDER — DEXAMETHASONE SODIUM PHOSPHATE 10 MG/ML IJ SOLN
INTRAMUSCULAR | Status: AC
  Filled 2023-09-16: qty 1

## 2023-09-16 MED ORDER — POTASSIUM CHLORIDE CRYS ER 20 MEQ PO TBCR: 20.0000 meq | EXTENDED_RELEASE_TABLET | Freq: Every day | ORAL | Status: DC | PRN

## 2023-09-16 MED ORDER — ATORVASTATIN CALCIUM 20 MG PO TABS
40.0000 mg | ORAL_TABLET | Freq: Every day | ORAL | Status: DC
  Administered 2023-09-16 – 2023-09-18 (×3): 40 mg via ORAL
  Filled 2023-09-16 (×3): qty 2

## 2023-09-16 SURGICAL SUPPLY — 71 items
APPLIER CLIP 11 MED OPEN (CLIP)
APPLIER CLIP 9.375 SM OPEN (CLIP)
BAG DECANTER FOR FLEXI CONT (MISCELLANEOUS) ×2 IMPLANT
BALLN LUTONIX 5X220X130 (BALLOONS) ×2
BALLN LUTONIX DCB 5X100X130 (BALLOONS) ×4
BALLOON LUTONIX 5X220X130 (BALLOONS) IMPLANT
BALLOON LUTONIX DCB 5X100X130 (BALLOONS) IMPLANT
BLADE SURG SZ11 CARB STEEL (BLADE) ×2 IMPLANT
BRUSH SCRUB EZ 4% CHG (MISCELLANEOUS) ×2 IMPLANT
CATH BEACON 5 .035 65 KMP TIP (CATHETERS) IMPLANT
CHLORAPREP W/TINT 26 (MISCELLANEOUS) ×2 IMPLANT
CLAMP SUTURE YELLOW 5 PAIRS (MISCELLANEOUS) ×2 IMPLANT
CLIP APPLIE 11 MED OPEN (CLIP) IMPLANT
CLIP APPLIE 9.375 SM OPEN (CLIP) IMPLANT
DEVICE PRESTO INFLATION (MISCELLANEOUS) IMPLANT
DRAPE INCISE IOBAN 66X45 STRL (DRAPES) ×2 IMPLANT
DRESSING SURGICEL FIBRLLR 1X2 (HEMOSTASIS) ×2 IMPLANT
DRSG OPSITE POSTOP 4X6 (GAUZE/BANDAGES/DRESSINGS) IMPLANT
DRSG SURGICEL FIBRILLAR 1X2 (HEMOSTASIS) ×2
ELECT CAUTERY BLADE 6.4 (BLADE) ×2 IMPLANT
ELECT REM PT RETURN 9FT ADLT (ELECTROSURGICAL) ×2
ELECTRODE REM PT RTRN 9FT ADLT (ELECTROSURGICAL) ×2 IMPLANT
GLIDEWIRE ADV .035X260CM (WIRE) IMPLANT
GLOVE BIO SURGEON STRL SZ7 (GLOVE) ×4 IMPLANT
GLOVE SURG SYN 8.0 (GLOVE) ×2 IMPLANT
GLOVE SURG SYN 8.0 PF PI (GLOVE) ×2 IMPLANT
GOWN STRL REUS W/ TWL LRG LVL3 (GOWN DISPOSABLE) ×2 IMPLANT
GOWN STRL REUS W/ TWL XL LVL3 (GOWN DISPOSABLE) ×2 IMPLANT
GOWN STRL REUS W/TWL 2XL LVL3 (GOWN DISPOSABLE) ×2 IMPLANT
GRAFT VASC PATCH XENOSURE 1X14 (Vascular Products) IMPLANT
HEMOSTAT HEMOBLAST BELLOWS (HEMOSTASIS) IMPLANT
IV NS 500ML BAXH (IV SOLUTION) ×2 IMPLANT
KIT PREVENA INCISION MGT 13 (CANNISTER) IMPLANT
KIT STIMULAN RAPID CURE 5CC (Orthopedic Implant) IMPLANT
KIT TURNOVER KIT A (KITS) ×2 IMPLANT
LABEL OR SOLS (LABEL) ×2 IMPLANT
LIFESTENT SOLO 6X200X135 (Permanent Stent) IMPLANT
LOOP VESSEL MAXI 1X406 RED (MISCELLANEOUS) ×4 IMPLANT
LOOP VESSEL MINI 0.8X406 BLUE (MISCELLANEOUS) ×6 IMPLANT
MANIFOLD NEPTUNE II (INSTRUMENTS) ×2 IMPLANT
NDL HYPO 18GX1.5 BLUNT FILL (NEEDLE) ×2 IMPLANT
NEEDLE HYPO 18GX1.5 BLUNT FILL (NEEDLE) ×2 IMPLANT
NS IRRIG 500ML POUR BTL (IV SOLUTION) ×2 IMPLANT
PACK ANGIOGRAPHY (CUSTOM PROCEDURE TRAY) IMPLANT
PACK BASIN MAJOR ARMC (MISCELLANEOUS) ×2 IMPLANT
PACK UNIVERSAL (MISCELLANEOUS) ×2 IMPLANT
PENCIL SMOKE EVACUATOR (MISCELLANEOUS) IMPLANT
RETRACTOR TRAXI PANNICULUS (MISCELLANEOUS) IMPLANT
SET WALTER ACTIVATION W/DRAPE (SET/KITS/TRAYS/PACK) ×2 IMPLANT
SHEATH BRITE TIP 6FRX11 (SHEATH) IMPLANT
SHEATH BRITE TIP 7FRX11 (SHEATH) IMPLANT
SPIKE FLUID TRANSFER (MISCELLANEOUS) ×2 IMPLANT
SPONGE T-LAP 18X18 ~~LOC~~+RFID (SPONGE) IMPLANT
STAPLER SKIN PROX 35W (STAPLE) ×2 IMPLANT
STENT LIFESTENT 5F 6X100X135 (Permanent Stent) IMPLANT
SUT ETHILON 3-0 FS-10 30 BLK (SUTURE) ×4
SUT PROLENE 5 0 RB 1 DA (SUTURE) ×8 IMPLANT
SUT PROLENE 6 0 BV (SUTURE) ×12 IMPLANT
SUT PROLENE 7 0 BV 1 (SUTURE) ×4 IMPLANT
SUT SILK 2-0 18XBRD TIE 12 (SUTURE) ×2 IMPLANT
SUT SILK 3-0 18XBRD TIE 12 (SUTURE) ×2 IMPLANT
SUT VIC AB 2-0 CT1 TAPERPNT 27 (SUTURE) ×4 IMPLANT
SUT VIC AB 3-0 SH 27X BRD (SUTURE) ×2 IMPLANT
SUT VICRYL+ 3-0 36IN CT-1 (SUTURE) ×4 IMPLANT
SUTURE EHLN 3-0 FS-10 30 BLK (SUTURE) ×2 IMPLANT
SYR 20ML LL LF (SYRINGE) ×2 IMPLANT
SYR 5ML LL (SYRINGE) ×2 IMPLANT
TAG SUTURE CLAMP YLW 5PR (MISCELLANEOUS) ×2
TRAP FLUID SMOKE EVACUATOR (MISCELLANEOUS) ×2 IMPLANT
TRAY FOLEY MTR SLVR 16FR STAT (SET/KITS/TRAYS/PACK) ×2 IMPLANT
WATER STERILE IRR 1000ML POUR (IV SOLUTION) ×2 IMPLANT

## 2023-09-16 NOTE — Anesthesia Procedure Notes (Addendum)
Procedure Name: Intubation Date/Time: 09/16/2023 8:42 AM  Performed by: Mohammed Kindle, CRNAPre-anesthesia Checklist: Patient identified, Emergency Drugs available, Suction available and Patient being monitored Patient Re-evaluated:Patient Re-evaluated prior to induction Oxygen Delivery Method: Circle system utilized Preoxygenation: Pre-oxygenation with 100% oxygen Induction Type: IV induction Ventilation: Mask ventilation without difficulty Laryngoscope Size: McGrath and 3 Grade View: Grade II Tube type: Oral Tube size: 6.5 mm Number of attempts: 1 Airway Equipment and Method: Stylet and Oral airway Placement Confirmation: ETT inserted through vocal cords under direct vision, positive ETCO2 and breath sounds checked- equal and bilateral Secured at: 20 cm Tube secured with: Tape Dental Injury: Teeth and Oropharynx as per pre-operative assessment

## 2023-09-16 NOTE — Op Note (Signed)
OPERATIVE NOTE   PROCEDURE: 1.   Left common femoral and superficial femoral artery endarterectomies 2.   Right common femoral and superficial femoral artery endarterectomies 3.   Right lower extremity angiogram 4.   Stent placement to the right SFA with 6 mm diameter by 10 cm length life stent (Dr. Gilda Crease primary) 5.   Left lower extremity angiogram 6.   Stent placement x 2 to the left SFA with 6 mm diameter by 20 cm length life stent and 6 mm diameter by 10 cm length life stent (Dr. Wyn Quaker primary) 7.   Incisional (disposable) VAC dressing placement on both wounds.    PRE-OPERATIVE DIAGNOSIS: 1.Atherosclerotic occlusive disease bilateral lower extremities with rest pain   POST-OPERATIVE DIAGNOSIS: Same  SURGEON: Festus Barren, MD  CO-surgeon:  Levora Dredge MD  ANESTHESIA:  general  ESTIMATED BLOOD LOSS: 400 cc  FINDING(S): 1.  significant plaque in bilateral common femoral and superficial femoral arteries proximally 2.  Near occlusive stenosis in both superficial femoral arteries from highly calcified plaque  SPECIMEN(S):  Bilateral common femoral and superficial femoral artery plaque.  INDICATIONS:    Patient presents with rest pain of the lower extremities.  Bilateral femoral endarterectomies as well as SFA intervention are planned to try to improve perfusion.  The risks and benefits as well as alternative therapies including intervention were reviewed in detail all questions were answered the patient agrees to proceed with surgery.  DESCRIPTION: After obtaining full informed written consent, the patient was brought back to the operating room and placed supine upon the operating table.  The patient received IV antibiotics prior to induction.  After obtaining adequate anesthesia, the patient was prepped and draped in the standard fashion appropriate time out is called.    With myself working on the right and Dr. Gilda Crease working on the left we began by dissecting out the femoral  arteries on each side. Vertical incisions were created overlying both femoral arteries. The common femoral artery proximally, and superficial femoral artery, and primary profunda femoris artery branches were encircled with vessel loops and prepared for control. Both femoral arteries were found to have significant plaque from the common femoral artery into the profunda and superficial femoral arteries.   6000 units of heparin was given and allowed circulate for 5 minutes.  An additional 2000 units of heparin were given later in the procedure  Attention is then turned to the left femoral artery.  An arteriotomy is made with 11 blade and extended with Potts scissors in the common femoral artery and carried down onto the first 3-4 cm of the superficial femoral artery. An endarterectomy was then performed. The [facility] was used to create a plane. The proximal endpoint was cut flush with tenotomy scissors. This was in the proximal common femoral artery. The distal endpoint of the superficial femoral artery endarterectomy was created with gentle traction and the distal endpoint was tacked down with 7-0 Prolene sutures as well as the origin of the profunda femoris artery.  The bovine pericardial patcth is then selected and prepared for a patch angioplasty.  It is cut and beveled and started at the proximal endpoint with a 6-0 Prolene suture.  Approximately one half of the suture line is run medially and laterally and the distal end point was cut and bevelled to match the arteriotomy.  A second 6-0 Prolene was started at the distal end point and run to the mid portion to complete the arteriotomy.  The vessel was flushed prior to release  of control and completion of the anastomosis.  At this point, flow was established first to the profunda femoris artery and then to the superficial femoral artery. Easily palpable pulses are noted well beyond the anastomosis and both arteries.  The right femoral artery is then  addressed. Arteriotomy is made in the common femoral artery and extended down into the first 3 to 4 cm of the superficial femoral artery. Similarly, an endarterectomy was performed with the [facility]. The proximal endpoint was cut flush with tenotomy scissors in the proximal common femoral artery. The arteriotomy was carried down onto the superficial femoral artery and the endarterectomy was continued to this point. The distal endpoint was thin and a hole was seen in the backwall of the superficial femoral artery that was repaired with 6-0 Prolene sutures and a piece of the bovine pericardial patch for a pledget to the repair.  The endpoint of the superficial femoral artery was tacked down with 7-0 Prolene suture and four 7-0 Prolene sutures were placed at the origin of the profunda femoris artery to avoid dissection into the profunda femoris artery. The bovine pericardial patch was then brought onto the field.  It is cut and beveled and started at the proximal endpoint with a 6-0 Prolene suture.  Approximately one half of the suture line is run medially and laterally and the distal end point was cut and bevelled to match the arteriotomy.  A second 6-0 Prolene was started at the distal end point and run to the mid portion to complete the arteriotomy.   Flushing maneuvers were performed and flow was reestablished to the femoral vessels. Excellent pulses noted in the right superficial femoral and profunda femoris artery below the femoral anastomosis.  We then turned our attention to the endovascular portion of the procedure.  Dr. Gilda Crease performed the intervention on the right leg initially.  He access the patch with a Seldinger needle and placed a 6 French sheath.  He performed right lower extremity imaging which showed very calcific near occlusive stenosis in the mid SFA.  He then crossed this lesion without difficulty with an advantage wire and a Kumpe catheter and primarily stented this area with a 6 mm  diameter by 10 cm length life stent and postdilated this area with a 5 mm diameter by 10 cm length Lutonix drug-coated angioplasty balloon with excellent angiographic completion result and less than 10% residual stenosis.  The 6 French sheath was removed with a pursestring 5-0 Prolene suture in the patch. I then performed the intervention on the left lower extremity.  I accessed the patch with a Seldinger needle and placed a 6 French sheath.  Imaging demonstrated significantly longer segment with nearly occlusive stenosis in the mid SFA but moderate stenosis of greater than 70% in the proximal SFA and the proximal popliteal artery.  There was then two-vessel runoff with the peroneal and anterior tibial artery.  I was able to cross the lesions without difficulty with an advantage wire and a Kumpe catheter.  Again, I elected to primarily stent the area and used a 6 mm diameter by 20 cm length life stent and a 6 mm diameter by 10 cm length life stent postdilated with 5 mm diameter Lutonix drug-coated balloons with excellent angiographic completion result and less than 10% residual stenosis in the left SFA and proximal popliteal artery.  The sheath was then removed with a 5-0 Prolene pursestring suture from the patch.  Wounds were then irrigated and prepared for closure.  Fibrillar  and Hemoblast topical hemostatic agents were placed in the femoral incisions and hemostasis was complete.  Vancomycin and gentamicin impregnated antibiotic beads were also placed into the wounds.  The femoral incisions were then closed in a layered fashion with 2 layers of 2-0 Vicryl, 2 layers of 3-0 Vicryl, and a combination of staples and 3-0 nylon sutures for the skin closure.  Incisional (disposable) VAC dressings were then placed.  The suction sponge was placed down and an adhesive dressing was used to obtain an occlusive seal when connected to suction tubing.  The patient was then awakened from anesthesia and taken to the recovery  room in stable condition having tolerated the procedure well.  COMPLICATIONS: None  CONDITION: Stable     Festus Barren 09/16/2023 1:40 PM  This note was created with Dragon Medical transcription system. Any errors in dictation are purely unintentional.

## 2023-09-16 NOTE — Anesthesia Preprocedure Evaluation (Addendum)
Anesthesia Evaluation  Patient identified by MRN, date of birth, ID band Patient awake    Reviewed: Allergy & Precautions, H&P , NPO status , Patient's Chart, lab work & pertinent test results  Airway Mallampati: II  TM Distance: >3 FB Neck ROM: full    Dental no notable dental hx.    Pulmonary shortness of breath and with exertion, Current SmokerPatient did not abstain from smoking. continues to smoke less than a pack per day   Pulmonary exam normal        Cardiovascular hypertension, + CAD (Coronary artery calcification seen on CT scan) and + Peripheral Vascular Disease    Carotid atherosclerosis, bilateral  ECHO 7/24: 1. Left ventricular ejection fraction, by estimation, is 55 to 60%. The  left ventricle has normal function. The left ventricle has no regional  wall motion abnormalities. There is mild left ventricular hypertrophy.  Left ventricular diastolic parameters  were normal.   2. Right ventricular systolic function is normal. The right ventricular  size is normal. There is normal pulmonary artery systolic pressure.   3. The mitral valve is normal in structure. No evidence of mitral valve  regurgitation. No evidence of mitral stenosis.   4. The aortic valve is grossly normal. Aortic valve regurgitation is not  visualized. Aortic valve sclerosis is present, with no evidence of aortic  valve stenosis.   5. The inferior vena cava is normal in size with greater than 50%  respiratory variability, suggesting right atrial pressure of 3 mmHg.    Left radial pulse diminished compared to right side. Blood pressure map 89 on right, 70 on left.     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  -According to the RCRI, patient has a 0.9% risk of MACE. Patient reports activity equivalent to 4.4 METS (per [facility]).  -Based on ACC/AHA guidelines, Wanda Clayton would be at acceptable risk for the planned procedure without further cardiovascular testin   Past Medical History: 02/08/2020: Acute pain of left foot No date: Anxiety No date: Coronary artery disease No date: Depression No date: Dyspnea 11/28/2020: Elevated liver enzymes No date: Hypertension No date: Peripheral vascular disease [facility])  Past Surgical History: No date: BUNIONECTOMY; Bilateral No date: carpal tunnel repair 08/19/2021: COLONOSCOPY WITH PROPOFOL; N/A     Comment:  Procedure: COLONOSCOPY WITH PROPOFOL;  Surgeon: Toney Reil, MD;  Location: [facility] ENDOSCOPY;  Service:               Gastroenterology;  Laterality: N/A; No date: DENTAL SURGERY No date: FOOT SURGERY; Right 08/18/2023: LOWER EXTREMITY ANGIOGRAPHY; Right     Comment:  Procedure: Lower Extremity Angiography;  Surgeon:               Renford Dills, MD;  Location: [facility] INVASIVE CV LAB;               Service: Cardiovascular;  Laterality: Right; No date: TUBAL LIGATION No date: VAGINAL DELIVERY  Comment:  x1     Reproductive/Obstetrics negative OB ROS                             Anesthesia Physical Anesthesia Plan  ASA: 3  Anesthesia Plan: General ETT   Post-op Pain Management: Ofirmev IV (intra-op)* and Toradol IV (intra-op)*   Induction: Intravenous  PONV Risk Score and Plan: 2 and Ondansetron, Dexamethasone and Midazolam  Airway Management Planned: Oral ETT  Additional Equipment:   Intra-op Plan:   Post-operative Plan: Extubation in OR  Informed  Consent: I have reviewed the patients History and Physical, chart, labs and discussed the procedure including the risks, benefits and alternatives for the proposed anesthesia with the patient or authorized representative who has indicated his/her understanding and acceptance.     Dental Advisory Given  Plan Discussed with: CRNA and Surgeon  Anesthesia Plan Comments: (PRN arterial line consented. Due to left subclavian stenosis, the right arm blood pressure will be used. )        Anesthesia Quick Evaluation

## 2023-09-16 NOTE — Anesthesia Postprocedure Evaluation (Signed)
Anesthesia Post Note  Patient: Wanda Clayton  Procedure(s) Performed: ENDARTERECTOMY FEMORAL (BILATERAL SFA STENTS) (Bilateral) APPLICATION OF CELL SAVER  Patient location during evaluation: PACU Anesthesia Type: General Level of consciousness: awake and alert Pain management: pain level controlled Vital Signs Assessment: post-procedure vital signs reviewed and stable Respiratory status: spontaneous breathing, nonlabored ventilation, respiratory function stable and patient connected to nasal cannula oxygen Cardiovascular status: blood pressure returned to baseline and stable Postop Assessment: no apparent nausea or vomiting Anesthetic complications: no  No notable events documented.   Last Vitals:  Vitals:   09/16/23 1415 09/16/23 1430  BP: (!) 107/56 (!) 101/54  Pulse: 77 85  Resp: 14 14  Temp:  37 C  SpO2: 96% 95%    Last Pain:  Vitals:   09/16/23 1430  TempSrc:   PainSc: 4                  Stephanie Coup

## 2023-09-16 NOTE — Interval H&P Note (Signed)
History and Physical Interval Note:  09/16/2023 7:24 AM  Wanda Clayton  has presented today for surgery, with the diagnosis of ASO WITH REST PAIN.  The various methods of treatment have been discussed with the patient and family. After consideration of risks, benefits and other options for treatment, the patient has consented to  Procedure(s): ENDARTERECTOMY FEMORAL (BILATERAL SFA STENTS) (Bilateral) APPLICATION OF CELL SAVER (N/A) as a surgical intervention.  The patient's history has been reviewed, patient examined, no change in status, stable for surgery.  I have reviewed the patient's chart and labs.  Questions were answered to the patient's satisfaction.     Levora Dredge

## 2023-09-16 NOTE — Anesthesia Procedure Notes (Signed)
Arterial Line Insertion Start/End12/01/2023 8:50 AM, 09/16/2023 8:53 AM Performed by: Foye Deer, MD, anesthesiologist  Preanesthetic checklist: patient identified, IV checked, site marked, risks and benefits discussed, surgical consent, monitors and equipment checked, pre-op evaluation, timeout performed and anesthesia consent Right, radial was placed Catheter size: 20 G Seldinger technique used  Attempts: 1 Procedure performed using ultrasound guided technique. Ultrasound Notes:anatomy identified, needle tip was noted to be adjacent to the nerve/plexus identified and no ultrasound evidence of intravascular and/or intraneural injection Following insertion, Biopatch and dressing applied. Post procedure assessment: normal  Patient tolerated the procedure well with no immediate complications.

## 2023-09-16 NOTE — Op Note (Signed)
OPERATIVE NOTE   PROCEDURE: Bilateral common femoral and superficial femoral endarterectomy with bovine pericardial patch angioplasty  PRE-OPERATIVE DIAGNOSIS: Atherosclerotic occlusive disease bilateral lower extremities with lifestyle limiting claudication and mild rest pain symptoms; hypertension; diabetes mellitus  POST-OPERATIVE DIAGNOSIS: Same  CO-SURGEON: Renford Dills, MD and Annice Needy, M.D.  ASSISTANT(S): None  ANESTHESIA: general  ESTIMATED BLOOD LOSS: 450 cc  FINDING(S): Profound calcific plaque noted bilaterally extending past the initial bifurcation of the profunda femoris arteries as well as down the extensive length of the SFA  SPECIMEN(S):  Calcific plaque from the common femoral, superficial femoral and the profunda femoris arteries bilaterally  INDICATIONS:   Wanda Clayton 72 y.o. y.o.female who presents with complaints of lifestyle limiting claudication and pain continuously in the feet bilaterally. The patient has documented severe atherosclerotic occlusive disease and has undergone multiple minimally invasive treatments in the past. However, at this point his primary area of stricture stenosis resides in the common femoral and origins of the superficial femoral and profunda femoris extending into these arteries and therefore this is not amenable to intervention and he is now undergoing open endarterectomy. The risks and benefits of been reviewed with the patient, all questions have answered; alternative therapies have been reviewed as well and the patient has agreed to proceed with surgical open repair.  DESCRIPTION: After obtaining full informed written consent, the patient was brought back to the operating room and placed supine upon the operating table.  The patient received IV antibiotics prior to induction.  After obtaining adequate anesthesia, the patient was prepped and draped in the standard fashion for: bilateral femoral exposure.  Co-surgeons  are required because this is a bilateral procedure with work being performed simultaneously from both the right femoral and left femoral approach.  This also expedites the procedure making a shorter operative time reducing complications and improving patient safety.  Attention was turned to the bilateral groin with Dr. Wyn Quaker working on the right and myself working on the left of the patient.  Vertical  incisions were made over the common femoral artery and dissected down to the common femoral artery with electrocautery.  I dissected out the common femoral artery from the distal external iliac artery (identified by the superficial circumflex vessels) down to the femoral bifurcation.  On initial inspection, the common femoral artery was: Densely calcified and there was no palpable pulse noted bilaterally.    Subsequently the dissection was continued to include all circumflex branches and the profunda femoral artery and superficial femoral artery. The superficial femoral artery was dissected circumferentially for a distance of approximately 3-4 cm and the profunda femoris was dissected circumferentially out to the fourth order branches individual vessel loops were placed around each branch. Both of the groins were treated simultaneously as described above. Control of all branches was obtained with vessel loops.  A softer area in the distal external iliac artery amendable to clamping was identified.    The patient was given 6000 units of Heparin intravenously (an additional 2000 units of heparin was given later in the case), which was a therapeutic bolus.   After waiting 3 minutes, the left distal external iliac artery was clamped and placed all circumflex branches, and the profunda and superficial femoral arteries under tension.  Arteriotomy was made in the left common femoral artery with a 11-blade and extended it with a Potts scissor proximally and distally extending the distal end down the left SFA for  approximately 3 cm.   Endarterectomy was  then performed under direct visualization using a freer elevator and a right angle from the mid common femoral extending up both proximally and distally. Proximally the endarterectomy was brought up to the level of the clamp where a clean edge was obtained. Distally the endarterectomy was carried down to a soft spot in the left SFA where a feathered edge would was obtained.  7-0 Prolene interrupted tacking sutures were placed to secure the leading edge of the plaque in the left profunda femoris.  At this point, we fashioned a bovine pericardial patch for the geometry of the arteriotomy.  The patch was sewn to the artery with 2 running stitches of 6-0 Prolene, running from each end.  Prior to completing the patch angioplasty, the profunda femoral artery was flushed as was the superficial femoral artery. The system was then forward flushed. The endarterectomy site was then irrigated copiously with heparinized saline. The patch angioplasty was completed in the usual fashion.  Flow was then reestablished first to the profunda femoris and then the superficial femoral artery. Any gaps or bleeding sites in the suture line were easily controlled with a 6-0 Prolene suture.   At this point we turned our attention to the right groin, the right distal external iliac artery was clamped and we placed all circumflex branches, and the profunda and superficial femoral arteries under tension.  Arteriotomy was made in the right common femoral artery with a 11-blade and extended it with a Potts scissor proximally and distally extending the distal end down the right SFA for approximately 3 cm.   Endarterectomy was then performed under direct visualization using a freer elevator and a right angle from the mid common femoral extending up both proximally and distally. Proximally the endarterectomy was brought up to the level of the clamp where a clean edge was obtained. Distally the  endarterectomy was carried down to a soft spot in the right SFA where a feathered edge would was obtained.  7-0 Prolene interrupted tacking sutures were placed to secure the leading edge of the plaque in the profunda femoris.  A gap in the posterior wall of the proximal superficial femoral sustained while perform the endarterectomy was then repaired using interrupted 6-0 Prolene sutures in association with a piece of the bovine pericardial patch as a pledget.  At this point, we fashioned a bovine pericardial patch for the geometry of the arteriotomy.  The patch was sewn to the artery with 2 running stitches of 6-0 Prolene, running from each end.  Prior to completing the patch angioplasty, the profunda femoral artery was flushed as was the superficial femoral artery. The system was then forward flushed. The endarterectomy site was then irrigated copiously with heparinized saline. The patch angioplasty was completed in the usual fashion.  Flow was then reestablished first to the profunda femoris and then the superficial femoral artery. Any gaps or bleeding sites in the suture line were easily controlled with a 6-0 Prolene suture.   At this point we turned our attention to the interventional portion.  With myself working on the right-hand side this time I accessed the center of the patch in the common femoral artery on the right with a Seldinger needle advanced a J-wire without difficulty and then placed a 6 French sheath.  Hand-injection contrast was then used to create a right lower extremity distal runoff.  Using an advantage wire and a Kumpe catheter I negotiated the wire catheter through the distal SFA stenosis.  Hand-injection contrast demonstrated single-vessel runoff.  Catheter  was removed after the advantage wire was advanced.  A 6 mm x 100 mm life stent was selected and deployed across the SFA lesion it was postdilated with a 5 mm x 100 mm Lutonix drug-eluting balloon.  Follow-up imaging demonstrated less  than 10% residual stenosis.  Single-vessel runoff via the peroneal is preserved.  Having successfully completed the right lower extremity intervention Dr. Wyn Quaker then proceeded with the left lower extremity and intervention.  In similar fashion he used a Seldinger needle and accessed the mid patch on the left side.  He then placed the J-wire followed by a 6 French sheath.  Hand-injection contrast demonstrated the distal runoff of the left lower extremity.  In the mid and then distal SFA there are tandem lesions of greater than 90%.  There is a lesion greater than 80% within the mid and proximal popliteal artery.  Using a Kumpe catheter and the advantage wire and negotiated through the lesions within the mid and distal SFA as well as through the lesion in the proximal and mid popliteal artery.  On this side Kumpe catheter revealed two-vessel runoff via the peroneal and the anterior tibial.  Using a 6 mm x 20 cm life stent followed by a 6 mm x 10 cm life stent overlapping by approximately 1 cm the 3 areas were covered.  The stents were then postdilated with 5 mm Lutonix drug-eluting balloons inflated to 10 atm for approximately 1 minute.  Follow-up imaging demonstrated less than 10% residual stenosis.  The sheaths were then removed after pursestring sutures of 5-0 Prolene were placed.  Any additional bleeding points along the suture line of both patches were repaired with 5 or 6-0 interrupted Prolene sutures.  Both right and left groins were then irrigated copiously with sterile saline and subsequently antibiotic beads with vancomycin and gentamicin were placed in the wound.  Fibrillar are and hemoblast topical hemostatic agent was then placed in the wounds.  The incision was repaired with a double layer of 2-0 Vicryl, a double layer of 3-0 Vicryl was then used.  Skin was then closed using interrupted vertical mattress nylon sutures with staples in between.  Prevena disposable vacs were then placed  bilaterally.   COMPLICATIONS: None  CONDITION: Almon Register, M.D. [facility] Office: [phone]  09/16/2023, 2:04 PM

## 2023-09-16 NOTE — Progress Notes (Signed)
Pt had a panic attack, therapeutic communication didn't work, notified MD still awaiting response, gave prn morphine, will continue to monitor

## 2023-09-16 NOTE — Transfer of Care (Signed)
Immediate Anesthesia Transfer of Care Note  Patient: Wanda Clayton  Procedure(s) Performed: ENDARTERECTOMY FEMORAL (BILATERAL SFA STENTS) (Bilateral) APPLICATION OF CELL SAVER  Patient Location: PACU  Anesthesia Type:General  Level of Consciousness: awake, drowsy, and patient cooperative  Airway & Oxygen Therapy: Patient Spontanous Breathing and Patient connected to face mask oxygen  Post-op Assessment: Report given to RN and Post -op Vital signs reviewed and stable  Post vital signs: Reviewed and stable  Last Vitals:  Vitals Value Taken Time  BP 101/54 09/16/23 1431  Temp 37 C 09/16/23 1430  Pulse 77 09/16/23 1434  Resp 18 09/16/23 1434  SpO2 94 % 09/16/23 1434  Vitals shown include unfiled device data.  Last Pain:  Vitals:   09/16/23 0747  TempSrc: Temporal  PainSc: 0-No pain         Complications: No notable events documented.

## 2023-09-16 NOTE — Progress Notes (Signed)
Patient awake/alert x4.  Patient states she can now feel her "feet" and able to "wiggle" toes. Also states color is "much better" Bilateral groins with prevena, noted right groin area prevena not completely adhered but functioning not alarming.  Provider made aware. Family updated

## 2023-09-17 ENCOUNTER — Encounter: Payer: Self-pay | Admitting: Vascular Surgery

## 2023-09-17 LAB — BASIC METABOLIC PANEL
Anion gap: 8 (ref 5–15)
BUN: 7 mg/dL — ABNORMAL LOW (ref 8–23)
CO2: 22 mmol/L (ref 22–32)
Calcium: 8.2 mg/dL — ABNORMAL LOW (ref 8.9–10.3)
Chloride: 105 mmol/L (ref 98–111)
Creatinine, Ser: 0.58 mg/dL (ref 0.44–1.00)
GFR, Estimated: >60 mL/min (ref >60)
Glucose, Bld: 140 mg/dL — ABNORMAL HIGH (ref 70–99)
Potassium: 3.6 mmol/L (ref 3.5–5.1)
Sodium: 135 mmol/L (ref 135–145)

## 2023-09-17 LAB — CBC
HCT: 32.7 % — ABNORMAL LOW (ref 36.0–46.0)
Hemoglobin: 11.1 g/dL — ABNORMAL LOW (ref 12.0–15.0)
MCH: 33.7 pg (ref 26.0–34.0)
MCHC: 33.9 g/dL (ref 30.0–36.0)
MCV: 99.4 fL (ref 80.0–100.0)
Platelets: 178 10*3/uL (ref 150–400)
RBC: 3.29 MIL/uL — ABNORMAL LOW (ref 3.87–5.11)
RDW: 13 % (ref 11.5–15.5)
WBC: 9.8 10*3/uL (ref 4.0–10.5)
nRBC: 0 % (ref 0.0–0.2)

## 2023-09-17 LAB — SURGICAL PATHOLOGY

## 2023-09-17 MED ORDER — FAMOTIDINE 20 MG PO TABS
20.0000 mg | ORAL_TABLET | Freq: Every day | ORAL | Status: DC
  Administered 2023-09-17 – 2023-09-19 (×3): 20 mg via ORAL
  Filled 2023-09-17 (×3): qty 1

## 2023-09-17 NOTE — Evaluation (Signed)
Occupational Therapy Evaluation Patient Details Name: Wanda Clayton MRN: [id number] DOB: 1950-11-29 Today's Date: 09/17/2023   History of Present Illness Patient admitted due to atherosclerotic occlusive disease bilateral lower extremities with lifestyle limiting claudication and mild rest pain symptoms; hypertension; diabetes mellitus. S/P Bilateral common femoral and superficial femoral endarterectomy with bovine pericardial patch angioplasty   Clinical Impression   Pt was seen for OT evaluation this date. Prior to hospital admission, pt was IND with all ADL's and IADL's. Pt lives with husband who is able to help pt as needed. Pt presents to acute OT demonstrating impaired ADL performance and functional mobility 2/2 (See OT problem list for additional functional deficits). Upon arrival to room pt supine in bed, agreeable to TX. Husband and PT present t/o session. Pt completed sup>sit t/f with CGA +HOB elevated. Pt had difficulty donning socks d/t pain and required Max A. Pt ambulated to the hall and back into room about ~159ft with CGA+RW. Pt reported feeling 6/10 pain near L wound vac area, pt monitored and repositioned. Pt returned to recliner. Pt left in recliner with breakfast tray and call bell within reach.Pt would benefit from skilled OT services to address noted impairments and functional limitations (see below for any additional details) in order to maximize safety and independence while minimizing falls risk and caregiver burden. Anticipate the need for follow up OT services upon acute hospital DC.        If plan is discharge home, recommend the following: A little help with walking and/or transfers;A little help with bathing/dressing/bathroom;Help with stairs or ramp for entrance;Assistance with cooking/housework;Assist for transportation    Functional Status Assessment  Patient has had a recent decline in their functional status and demonstrates the ability to make significant  improvements in function in a reasonable and predictable amount of time.  Equipment Recommendations  Other (comment) (Defer to next venue of care)    Recommendations for Other Services       Precautions / Restrictions Precautions Precautions: Fall Restrictions Weight Bearing Restrictions: No      Mobility Bed Mobility Overal bed mobility: Needs Assistance Bed Mobility: Supine to Sit     Supine to sit: Contact guard, HOB elevated       Patient Response: Cooperative  Transfers Overall transfer level: Needs assistance Equipment used: Rolling walker (2 wheels) Transfers: Sit to/from Stand Sit to Stand: Contact guard assist                  Balance Overall balance assessment: Needs assistance Sitting-balance support: Feet supported Sitting balance-Leahy Scale: Good     Standing balance support: Bilateral upper extremity supported, During functional activity, Reliant on assistive device for balance Standing balance-Leahy Scale: Good                             ADL either performed or assessed with clinical judgement   ADL Overall ADL's : Needs assistance/impaired                                     Functional mobility during ADLs: Contact guard assist;Rolling walker (2 wheels) General ADL Comments: Pt ambulated to the hall and back into room about ~135ft with CGA+RW. P{t reported feeling 6/10 pain, pt monitored and repositioned.     Vision Patient Visual Report: No change from baseline       Perception  Praxis         Pertinent Vitals/Pain Pain Assessment Pain Assessment: 0-10 Pain Score: 6  Pain Location: L Wound vac placement area ( inner thigh) Pain Descriptors / Indicators: Discomfort, Grimacing Pain Intervention(s): Monitored during session, Repositioned     Extremity/Trunk Assessment Upper Extremity Assessment Upper Extremity Assessment: Generalized weakness   Lower Extremity Assessment Lower  Extremity Assessment: LLE deficits/detail LLE Deficits / Details: discomfort   Cervical / Trunk Assessment Cervical / Trunk Assessment: Normal   Communication Communication Communication: No apparent difficulties Cueing Techniques: Verbal cues   Cognition Arousal: Alert Behavior During Therapy: WFL for tasks assessed/performed, Anxious Overall Cognitive Status: Within Functional Limits for tasks assessed                                       General Comments       Exercises     Shoulder Instructions      Home Living Family/patient expects to be discharged to:: Private residence Living Arrangements: Spouse/significant other Available Help at Discharge: Family;Available 24 hours/day Type of Home: House Home Access: Level entry     Home Layout: One level               Home Equipment: None   Additional Comments: patient reports she may have a walker somewhere.      Prior Functioning/Environment Prior Level of Function : Independent/Modified Independent;Driving             Mobility Comments: NO AD use at baseline ADLs Comments: IND with ADL's and IADL's        OT Problem List: Decreased strength;Decreased range of motion;Decreased safety awareness;Decreased activity tolerance;Decreased knowledge of use of DME or AE;Impaired balance (sitting and/or standing)      OT Treatment/Interventions: Self-care/ADL training;Therapeutic exercise;Therapeutic activities;Patient/family education;DME and/or AE instruction    OT Goals(Current goals can be found in the care plan section) Acute Rehab OT Goals Patient Stated Goal: to go home OT Goal Formulation: With patient/family Time For Goal Achievement: 10/01/23 Potential to Achieve Goals: Good  OT Frequency: Min 1X/week    Co-evaluation              AM-PAC OT "6 Clicks" Daily Activity     Outcome Measure Help from another person eating meals?: None Help from another person taking care of  personal grooming?: None Help from another person toileting, which includes using toliet, bedpan, or urinal?: A Little Help from another person bathing (including washing, rinsing, drying)?: A Little Help from another person to put on and taking off regular upper body clothing?: None Help from another person to put on and taking off regular lower body clothing?: A Little 6 Click Score: 21   End of Session Equipment Utilized During Treatment: Rolling walker (2 wheels);Gait belt Nurse Communication: Mobility status  Activity Tolerance: Patient tolerated treatment well Patient left: in chair;with call bell/phone within reach;with family/visitor present  OT Visit Diagnosis: Unsteadiness on feet (R26.81);Other abnormalities of gait and mobility (R26.89);Muscle weakness (generalized) (M62.81)                Time:  -    Charges:     Butch Penny, SOT

## 2023-09-17 NOTE — Progress Notes (Signed)
  Progress Note    09/17/2023 11:55 AM 1 Day Post-Op  Subjective:  kemyia kuczek is a 72 yo female now POD#1 from bilateral common femoral and superficial femoral endarterectomies with bovine pericardial patch angioplasty. Patient is recovering as expected. She denies any significant pain. Endorses her pain and tingling to her legs is now gone. Ambulated around unit with physical therapy today. Did well. Eating and urinating well. Arterial line removed. Vitals all remain stable.    Vitals:   09/17/23 0700 09/17/23 0800  BP: (!) 103/50 128/61  Pulse: 83 79  Resp: 13 18  Temp:  97.8 F (36.6 C)  SpO2: 97% 99%   Physical Exam: Cardiac:  RRR, normal S1, S2. No murmur Lungs:  Clear throughout on auscultation but diminished in the bases. No rales rhonchi or wheezing to note. Incisions:  Bilateral groin incision clean dry and intact. Both groin prevena wound vacs did not keep seal and were changed to dry clean guaze covered with tegaderm.  Extremities:  Bilateral lower extremities are warm to touch with good capillary refill. Positive doppler DP/PT pulses.  Abdomen:  Positive bowel sounds throughout, soft non tender and non distended.  Neurologic: AAOX4 and follows commands.   CBC    Component Value Date/Time   WBC 9.8 09/17/2023 0001   RBC 3.29 (L) 09/17/2023 0001   HGB 11.1 (L) 09/17/2023 0001   HCT 32.7 (L) 09/17/2023 0001   PLT 178 09/17/2023 0001   MCV 99.4 09/17/2023 0001   MCH 33.7 09/17/2023 0001   MCHC 33.9 09/17/2023 0001   RDW 13.0 09/17/2023 0001   LYMPHSABS 1.8 08/24/2023 0923   MONOABS 0.5 08/24/2023 0923   EOSABS 0.1 08/24/2023 0923   BASOSABS 0.1 08/24/2023 0923    BMET    Component Value Date/Time   NA 135 09/17/2023 0001   NA 140 04/19/2015 0000   K 3.6 09/17/2023 0001   CL 105 09/17/2023 0001   CO2 22 09/17/2023 0001   GLUCOSE 140 (H) 09/17/2023 0001   BUN 7 (L) 09/17/2023 0001   BUN 14 04/19/2015 0000   CREATININE 0.58 09/17/2023 0001   CALCIUM 8.2  (L) 09/17/2023 0001   GFRNONAA >60 09/17/2023 0001    INR    Component Value Date/Time   INR 0.9 07/08/2022 0905     Intake/Output Summary (Last 24 hours) at 09/17/2023 1155 Last data filed at 09/17/2023 0800 Gross per 24 hour  Intake 3033.03 ml  Output 1880 ml  Net 1153.03 ml     Assessment/Plan:  72 y.o. female is s/p Bilateral common femoral and superficial femoral endarterectomy with bovine pericardial patch angioplasty. 1 Day Post-Op   PLAN: Dc arterial ine Dc foley catheter PT/OT  Pain medication PRN Advance diet as tolerated Dressing changes to bilateral groins Q 8 hrs.  Transfer to floor.    DVT prophylaxis:  ASA 81 mg daily, Plavix 75 mg Daily and Lovenox 40 mg SQ Daily   Rutha Melgoza R [facility] 09/17/2023 11:55 AM

## 2023-09-17 NOTE — Evaluation (Signed)
Physical Therapy Evaluation Patient Details Name: Wanda Clayton MRN: [id number] DOB: 02-Oct-1951 Today's Date: 09/17/2023  History of Present Illness  Patient admitted due to atherosclerotic occlusive disease bilateral lower extremities with lifestyle limiting claudication and mild rest pain symptoms; hypertension; diabetes mellitus. S/P Bilateral common femoral and superficial femoral endarterectomy with bovine pericardial patch angioplasty   Clinical Impression  Patient received sitting edge of bed with OT present. She is agreeable to PT session. Patient requires cga for sit to stand. She requires assist with line management. Patient ambulated with RW 125 feet and cga. She has mild pain due to tape pulling at wound vac site ( left inner thigh.) patient will continue to benefit from skilled PT to improve functional mobility and independence.         If plan is discharge home, recommend the following: A little help with walking and/or transfers;A little help with bathing/dressing/bathroom;Assist for transportation;Help with stairs or ramp for entrance   Can travel by private vehicle    yes    Equipment Recommendations Rolling walker (2 wheels)  Recommendations for Other Services       Functional Status Assessment Patient has had a recent decline in their functional status and demonstrates the ability to make significant improvements in function in a reasonable and predictable amount of time.     Precautions / Restrictions Precautions Precautions: Fall Restrictions Weight Bearing Restrictions: No      Mobility  Bed Mobility Overal bed mobility: Needs Assistance Bed Mobility: Supine to Sit     Supine to sit: Contact guard, HOB elevated          Transfers Overall transfer level: Needs assistance Equipment used: Rolling walker (2 wheels) Transfers: Sit to/from Stand Sit to Stand: Contact guard assist                Ambulation/Gait Ambulation/Gait assistance:  Contact guard assist Gait Distance (Feet): 125 Feet Assistive device: Rolling walker (2 wheels) Gait Pattern/deviations: Step-through pattern, Decreased stride length Gait velocity: decr     General Gait Details: no lob, some mild pain at wound vac site ( reports due to tape)  Stairs            Wheelchair Mobility     Tilt Bed    Modified Rankin (Stroke Patients Only)       Balance Overall balance assessment: Needs assistance Sitting-balance support: Feet supported Sitting balance-Leahy Scale: Good     Standing balance support: Bilateral upper extremity supported, During functional activity, Reliant on assistive device for balance Standing balance-Leahy Scale: Good                               Pertinent Vitals/Pain Pain Assessment Pain Assessment: Faces Faces Pain Scale: Hurts a little bit Pain Location: L Wound vac placement area ( inner thigh) Pain Descriptors / Indicators: Discomfort Pain Intervention(s): Monitored during session, Repositioned    Home Living Family/patient expects to be discharged to:: Private residence Living Arrangements: Spouse/significant other Available Help at Discharge: Family;Available 24 hours/day Type of Home: House Home Access: Level entry       Home Layout: One level Home Equipment: None Additional Comments: patient reports she may have a walker somewhere.    Prior Function Prior Level of Function : Independent/Modified Independent;Driving                     Extremity/Trunk Assessment   Upper Extremity Assessment Upper Extremity Assessment:  Defer to OT evaluation    Lower Extremity Assessment Lower Extremity Assessment: LLE deficits/detail LLE Deficits / Details: discomfort    Cervical / Trunk Assessment Cervical / Trunk Assessment: Normal  Communication   Communication Communication: No apparent difficulties Cueing Techniques: Verbal cues  Cognition Arousal: Alert Behavior During  Therapy: WFL for tasks assessed/performed, Anxious Overall Cognitive Status: Within Functional Limits for tasks assessed                                          General Comments      Exercises     Assessment/Plan    PT Assessment Patient needs continued PT services  PT Problem List Decreased strength;Decreased mobility;Decreased activity tolerance;Decreased knowledge of use of DME;Pain       PT Treatment Interventions DME instruction;Gait training;Therapeutic exercise;Functional mobility training;Therapeutic activities;Patient/family education    PT Goals (Current goals can be found in the Care Plan section)  Acute Rehab PT Goals Patient Stated Goal: to return home PT Goal Formulation: With patient/family Time For Goal Achievement: 10/01/23 Potential to Achieve Goals: Good    Frequency Min 1X/week     Co-evaluation               AM-PAC PT "6 Clicks" Mobility  Outcome Measure Help needed turning from your back to your side while in a flat bed without using bedrails?: A Little Help needed moving from lying on your back to sitting on the side of a flat bed without using bedrails?: A Little Help needed moving to and from a bed to a chair (including a wheelchair)?: A Little Help needed standing up from a chair using your arms (e.g., wheelchair or bedside chair)?: A Little Help needed to walk in hospital room?: A Little Help needed climbing 3-5 steps with a railing? : A Little 6 Click Score: 18    End of Session Equipment Utilized During Treatment: Gait belt Activity Tolerance: Patient tolerated treatment well Patient left: in chair;with call bell/phone within reach;with family/visitor present Nurse Communication: Mobility status PT Visit Diagnosis: Other abnormalities of gait and mobility (R26.89);Difficulty in walking, not elsewhere classified (R26.2)    Time: 5188-4166 PT Time Calculation (min) (ACUTE ONLY): 11 min   Charges:   PT  Evaluation $PT Eval Moderate Complexity: 1 Mod   PT General Charges $$ ACUTE PT VISIT: 1 Visit         Dontavis Tschantz, PT, GCS 09/17/23,10:15 AM

## 2023-09-18 NOTE — Plan of Care (Signed)
iscussed with patient in bedside report plan of care for the evening, pain management and bedtime routine with some teach back displayed.  What is important to the patient is encouraging fluids to void.    Problem: Education: Goal: Knowledge of General Education information will improve Description: Including pain rating scale, medication(s)/side effects and non-pharmacologic comfort measures Outcome: Progressing   Problem: Activity: Goal: Risk for activity intolerance will decrease Outcome: Progressing   Problem: Elimination: Goal: Will not experience complications related to urinary retention Outcome: Not Progressing   Problem: Pain Management: Goal: General experience of comfort will improve Outcome: Progressing

## 2023-09-18 NOTE — Plan of Care (Signed)

## 2023-09-19 MED ORDER — ACETAMINOPHEN 325 MG PO TABS: 325.0000 mg | ORAL_TABLET | ORAL | Status: DC | PRN

## 2023-09-19 MED ORDER — CLOPIDOGREL BISULFATE 75 MG PO TABS
75.0000 mg | ORAL_TABLET | Freq: Every day | ORAL | 3 refills | Status: DC
  Filled 2023-09-19: qty 30, 30d supply, fill #0
  Filled 2023-10-19: qty 30, 30d supply, fill #1
  Filled 2023-11-11: qty 30, 30d supply, fill #2
  Filled 2023-11-28: qty 30, 30d supply, fill #3

## 2023-09-19 MED ORDER — OXYCODONE-ACETAMINOPHEN 5-325 MG PO TABS: 1.0000 | ORAL_TABLET | ORAL | 0 refills | Status: DC | PRN

## 2023-09-19 NOTE — Discharge Summary (Signed)
Physician Discharge Summary  [name] MRN: [id number] DOB/AGE: 72-Sep-1952 72 y.o.  Admit date: 09/16/2023 Discharge date: 09/19/2023  Admission Diagnoses:  Discharge Diagnoses:  Principal Problem:   Atherosclerosis of artery of extremity with rest pain [facility])   Discharged Condition: good  Hospital Course: Patient was admitted to the hospital on 16 September 2019 for and underwent bilateral femoral endarterectomy extensively along with SFA stents injured procedurally.  She did well, was transferred to the recovery room in the ICU.  She has continued to improve slowly over the last few days, being weaned from IV pain medicine, diet has been advanced, Foley catheter removed.  She has ambulated in limited fashion within the room.  She is currently on aspirin, Plavix, prophylactic Lovenox.  Vital signs remained stable.  At this point she desires to be discharged.  Consults: pulmonary/intensive care  Significant Diagnostic Studies: angiography: Intraprocedural arteriogram  Treatments: IV hydration, antibiotics: Ancef, analgesia: acetaminophen, acetaminophen w/ codeine, and Morphine, anticoagulation: ASA, Plavix, and heparin, and surgery: Bilateral femoral endarterectomy, SFA stents  Discharge Exam: Blood pressure (!) 111/94, pulse 92, temperature 97.7 F (36.5 C), temperature source Oral, resp. rate 19, height 5\' 3"  (1.6 m), weight 59 kg, SpO2 96%. General appearance: alert and no distress Extremities: extremities normal, atraumatic, no cyanosis or edema Pulses: Right Pulses: FEM: present 1+, POP: present 1+, DP: present 1+, PT: present 2+ Left Pulses: FEM: present 2+, POP: present 2+, DP: present 2+, PT: present 2+ Skin: Skin color, texture, turgor normal. No rashes or lesions Incision/Wound:  Disposition: Discharge disposition: 01-Home or Self Care       Discharge Instructions     Call MD for:  redness, tenderness, or signs of infection (pain, swelling, bleeding,  redness, odor or green/yellow discharge around incision site)   Complete by: As directed    Call MD for:  severe or increased pain, loss or decreased feeling  in affected limb(s)   Complete by: As directed    Call MD for:  temperature >100.5   Complete by: As directed    Discharge instructions   Complete by: As directed    Take Tylenol and/or Motrin as needed for pain preferentially, Percocet if not enough. May shower, wash incisions gently with soap and water, pat dry.  Do not submerge. Keep dry gauze on your incisions to keep skin off the skin.  Repeat twice a day.   Driving Restrictions   Complete by: As directed    No driving for until seen in clinic in follow-up.   Lifting restrictions   Complete by: As directed    No lifting for 10 to 14 days   No dressing needed   Complete by: As directed    Replace only if drainage present   Resume previous diet   Complete by: As directed       Allergies as of 09/19/2023       Reactions   Pollen Extract Itching        Medication List     TAKE these medications    acetaminophen 325 MG tablet Commonly known as: TYLENOL Take 1-2 tablets (325-650 mg total) by mouth every 4 (four) hours as needed for mild pain (pain score 1-3) (or temp >/= 101 F).   amLODipine 5 MG tablet Commonly known as: NORVASC Take 1 tablet (5 mg total) by mouth daily as needed. What changed: when to take this   aspirin 81 MG tablet Take 81 mg by mouth daily.   atorvastatin 40  MG tablet Commonly known as: LIPITOR TAKE 1 TABLET BY MOUTH DAILY AT 6 PM.   B-12 PO Take 500 mcg by mouth daily.   CALCIUM PO Take 500 mg by mouth daily.   clopidogrel 75 MG tablet Commonly known as: PLAVIX Take 1 tablet (75 mg total) by mouth daily at 6 (six) AM. Start taking on: September 20, 2023   EQ MULTIVITAMINS ADULT GUMMY PO Take 2 tablets by mouth daily.   ezetimibe 10 MG tablet Commonly known as: ZETIA Take 1 tablet (10 mg total) by mouth daily.   meclizine  12.5 MG tablet Commonly known as: ANTIVERT Take 1 tablet (12.5 mg total) by mouth 3 (three) times daily as needed (vertigo).   oxyCODONE-acetaminophen 5-325 MG tablet Commonly known as: PERCOCET/ROXICET Take 1-2 tablets by mouth every 4 (four) hours as needed for moderate pain (pain score 4-6).   VITAMIN D3 PO Take 25 mcg by mouth daily.        Follow-up Information     Schnier, Latina Craver, MD Follow up in 3 week(s).   Specialties: Vascular Surgery, Cardiology, Radiology, Vascular Surgery Why: Post op staple removal with U/S bilat lower extremitys with ABI's Contact information: [address] [place] [address] [phone]                 Signed: Learta Codding 09/19/2023, 8:38 AM

## 2023-09-21 ENCOUNTER — Other Ambulatory Visit: Payer: Self-pay

## 2023-09-21 ENCOUNTER — Telehealth: Payer: Self-pay

## 2023-09-21 MED ORDER — OXYCODONE-ACETAMINOPHEN 5-325 MG PO TABS
1.0000 | ORAL_TABLET | ORAL | 0 refills | Status: DC | PRN
  Filled 2023-09-21: qty 30, 3d supply, fill #0

## 2023-09-21 NOTE — Transitions of Care (Post Inpatient/ED Visit) (Signed)
09/21/2023  Name: Wanda Clayton MRN: [id number] DOB: 31-Dec-1950  Today's TOC FU Call Status: Today's TOC FU Call Status:: Successful TOC FU Call Completed TOC FU Call Complete Date: 09/21/23 Patient's Name and Date of Birth confirmed.  Transition Care Management Follow-up Telephone Call Date of Discharge: 09/19/23 Discharge Facility: [facility]) Type of Discharge: Inpatient Admission Primary Inpatient Discharge Diagnosis:: Bilateral Endarterectomy with stents How have you been since you were released from the hospital?: Better Any questions or concerns?: Yes Patient Questions/Concerns:: Care and questions about staples Patient Questions/Concerns Addressed: Other: (provided verbal education)  Items Reviewed: Did you receive and understand the discharge instructions provided?: Yes Medications obtained,verified, and reconciled?: Yes (Medications Reviewed) Any new allergies since your discharge?: No Dietary orders reviewed?: Yes Type of Diet Ordered:: Heart Healthy Do you have support at home?: Yes People in Home: spouse Name of Support/Comfort Primary Source: Simonne Martinet and Jennet Maduro  Medications Reviewed Today: Medications Reviewed Today     Reviewed by Redge Gainer, RN (Case Manager) on 09/21/23 at 1348  Med List Status: <None>   Medication Order Taking? Sig Documenting Provider Last Dose Status Informant  acetaminophen (TYLENOL) 325 MG tablet [id number]  Take 1-2 tablets (325-650 mg total) by mouth every 4 (four) hours as needed for mild pain (pain score 1-3) (or temp >/= 101 F). Learta Codding, MD  Active   amLODipine (NORVASC) 5 MG tablet [id number] No Take 1 tablet (5 mg total) by mouth daily as needed.  Patient taking differently: Take 5 mg by mouth daily.   Allegra Grana, FNP 09/16/2023 Active Self  aspirin 81 MG tablet [id number] No Take 81 mg by mouth daily. [provider] 09/15/2023 Active Self  atorvastatin (LIPITOR)  40 MG tablet [id number] No TAKE 1 TABLET BY MOUTH DAILY AT 6 PM. Allegra Grana, FNP 09/15/2023 Active Self  CALCIUM PO [id number] No Take 500 mg by mouth daily. [provider] 09/15/2023 Active Self  Cholecalciferol (VITAMIN D3 PO) [id number] No Take 25 mcg by mouth daily. [provider] 09/15/2023 Active Self  clopidogrel (PLAVIX) 75 MG tablet [id number]  Take 1 tablet (75 mg total) by mouth daily at 6 (six) AM. Learta Codding, MD  Active   Cyanocobalamin (B-12 PO) [id number] No Take 500 mcg by mouth daily. [provider] 09/15/2023 Active Self  ezetimibe (ZETIA) 10 MG tablet [id number] No Take 1 tablet (10 mg total) by mouth daily. Allegra Grana, FNP 09/16/2023 Active Self  meclizine (ANTIVERT) 12.5 MG tablet [id number] No Take 1 tablet (12.5 mg total) by mouth 3 (three) times daily as needed (vertigo).  Patient not taking: Reported on 08/27/2023   Allegra Grana, FNP Not Taking Active Self           Med Note Jimmey Ralph, Zenovia Jarred   Tue Aug 18, 2023  7:21 AM) Patient states, "I never took it."  Multiple Vitamins-Minerals (EQ MULTIVITAMINS ADULT GUMMY PO) [id number] No Take 2 tablets by mouth daily. [provider] 09/15/2023 Active Self  oxyCODONE-acetaminophen (PERCOCET/ROXICET) 5-325 MG tablet [id number]  Take 1-2 tablets by mouth every 4 (four) hours as needed for moderate pain (pain score 4-6). Learta Codding, MD  Active   oxyCODONE-acetaminophen (PERCOCET/ROXICET) 5-325 MG tablet [id number]  Take 1-2 tablets by mouth every 4 (four) hours as needed for moderate pain Learta Codding, MD  Active             Home Care and Equipment/Supplies: Were Home Health Services Ordered?: NA Any new equipment  or medical supplies ordered?: NA  Functional Questionnaire: Do you need assistance with bathing/showering or dressing?: No Do you need assistance with meal preparation?: No Do you need assistance with eating?: No Do you have difficulty maintaining continence:  No Do you need assistance with getting out of bed/getting out of a chair/moving?: No Do you have difficulty managing or taking your medications?: No  Follow up appointments reviewed: PCP Follow-up appointment confirmed?: NA Specialist Hospital Follow-up appointment confirmed?: Yes Date of Specialist follow-up appointment?: 10/16/23 Follow-Up Specialty Provider:: Dr. Vivia Birmingham Do you need transportation to your follow-up appointment?: No Do you understand care options if your condition(s) worsen?: Yes-patient verbalized understanding  SDOH Interventions Today    Flowsheet Row Most Recent Value  SDOH Interventions   Food Insecurity Interventions Intervention Not Indicated  Housing Interventions Intervention Not Indicated  Transportation Interventions Intervention Not Indicated  Utilities Interventions Intervention Not Indicated      Outreach completed to the patient today. She had bilateral femoral endarterectomies with stents. She currently has 6-8 staples on each leg. She states that they are sore and pinching. She has some Percocet she is going to take but states she doesn't want to take too many because of the way they make her feel. Reviewed complications associated with Opioids. She also states she had a foley catheter in the hospital that was painful upon removal and she has some blood in her urine. Educated her on the trauma a foley can cause in the bladder and that she is also on Plavix and Aspirin and the blood in the urine could last a bit longer. She states that she is drinking a lot of water. She states she is going to take a shower today but she is not allowed to drive. Educated that patient to keep the staples moist with something like Vaseline or Aquaphor so that the staples don't grow into her skin and make if more difficult to remove. RNCM to follow up in one week and assess the patient's progress.  Deidre Ala, RN Medical illustrator [facility] [phone]

## 2023-09-22 ENCOUNTER — Other Ambulatory Visit: Payer: Self-pay | Admitting: Acute Care

## 2023-09-22 DIAGNOSIS — Z122 Encounter for screening for malignant neoplasm of respiratory organs: Secondary | ICD-10-CM

## 2023-09-22 DIAGNOSIS — F17210 Nicotine dependence, cigarettes, uncomplicated: Secondary | ICD-10-CM

## 2023-09-22 DIAGNOSIS — Z87891 Personal history of nicotine dependence: Secondary | ICD-10-CM

## 2023-09-28 ENCOUNTER — Other Ambulatory Visit: Payer: Self-pay

## 2023-09-28 NOTE — Patient Outreach (Signed)
  Care Coordination   Follow Up Visit Note   09/28/2023 Name: Wanda Clayton MRN: [id number] DOB: 1951/01/22  Wanda Clayton is a 71 y.o. year old female who sees Arnett, Lyn Records, FNP for primary care. I spoke with  [name] by phone today.  What matters to the patients health and wellness today?  Healing of the bilateral inner thigh incisions with the staples    Goals Addressed   None     SDOH assessments and interventions completed:  Yes. None identified     Care Coordination Interventions:  Yes, provided. Follow up to patient to assess if her urine cleared up and to assess an update on the bilateral inner thigh incisions. The patient had a traumatic removal of the foley and had some blood clots in her urine when she got home. She states this has cleared up. She reports no signs and symptoms of infection at the incision site. She continues to put vaseline on the staples to keep the skin soft. No other questions are concerns. She declines PCP follow up until she has the staples removed. She states she had what she thinks is a panic attack and wanted to know if her provider would call in some medication but I informed her that her provider would likely want to see her first. She voiced understanding.   Follow up plan: No further intervention required.   Encounter Outcome:  Patient Visit Completed   Deidre Ala, RN RN Care Manager [facility] [phone]

## 2023-10-03 ENCOUNTER — Emergency Department: Payer: No Typology Code available for payment source

## 2023-10-03 ENCOUNTER — Emergency Department
Admission: EM | Admit: 2023-10-03 | Discharge: 2023-10-03 | Disposition: A | Discharge status: Home / Self Care | Payer: No Typology Code available for payment source | Attending: Emergency Medicine | Admitting: Emergency Medicine

## 2023-10-03 ENCOUNTER — Encounter: Payer: Self-pay | Admitting: Intensive Care

## 2023-10-03 ENCOUNTER — Other Ambulatory Visit: Payer: Self-pay

## 2023-10-03 DIAGNOSIS — R059 Cough, unspecified: Secondary | ICD-10-CM | POA: Diagnosis not present

## 2023-10-03 DIAGNOSIS — I10 Essential (primary) hypertension: Secondary | ICD-10-CM | POA: Diagnosis not present

## 2023-10-03 DIAGNOSIS — M7989 Other specified soft tissue disorders: Secondary | ICD-10-CM | POA: Diagnosis not present

## 2023-10-03 DIAGNOSIS — F419 Anxiety disorder, unspecified: Secondary | ICD-10-CM | POA: Insufficient documentation

## 2023-10-03 DIAGNOSIS — Z20822 Contact with and (suspected) exposure to covid-19: Secondary | ICD-10-CM | POA: Diagnosis not present

## 2023-10-03 DIAGNOSIS — R197 Diarrhea, unspecified: Secondary | ICD-10-CM | POA: Insufficient documentation

## 2023-10-03 DIAGNOSIS — R0602 Shortness of breath: Secondary | ICD-10-CM | POA: Insufficient documentation

## 2023-10-03 DIAGNOSIS — I959 Hypotension, unspecified: Secondary | ICD-10-CM | POA: Diagnosis not present

## 2023-10-03 DIAGNOSIS — J432 Centrilobular emphysema: Secondary | ICD-10-CM | POA: Diagnosis not present

## 2023-10-03 DIAGNOSIS — I700 Atherosclerosis of aorta: Secondary | ICD-10-CM | POA: Diagnosis not present

## 2023-10-03 DIAGNOSIS — I2510 Atherosclerotic heart disease of native coronary artery without angina pectoris: Secondary | ICD-10-CM | POA: Diagnosis not present

## 2023-10-03 DIAGNOSIS — J42 Unspecified chronic bronchitis: Secondary | ICD-10-CM | POA: Diagnosis not present

## 2023-10-03 DIAGNOSIS — R457 State of emotional shock and stress, unspecified: Secondary | ICD-10-CM | POA: Diagnosis not present

## 2023-10-03 DIAGNOSIS — R000 Tachycardia, unspecified: Secondary | ICD-10-CM | POA: Diagnosis not present

## 2023-10-03 HISTORY — DX: Pure hypercholesterolemia, unspecified: E78.00

## 2023-10-03 LAB — CBC WITH DIFFERENTIAL/PLATELET
Abs Immature Granulocytes: 0.06 10*3/uL (ref 0.00–0.07)
Basophils Absolute: 0.1 10*3/uL (ref 0.0–0.1)
Basophils Relative: 1 %
Eosinophils Absolute: 0.1 10*3/uL (ref 0.0–0.5)
Eosinophils Relative: 1 %
HCT: 37.3 % (ref 36.0–46.0)
Hemoglobin: 12.4 g/dL (ref 12.0–15.0)
Immature Granulocytes: 1 %
Lymphocytes Relative: 10 %
Lymphs Abs: 1.3 10*3/uL (ref 0.7–4.0)
MCH: 33.3 pg (ref 26.0–34.0)
MCHC: 33.2 g/dL (ref 30.0–36.0)
MCV: 100.3 fL — ABNORMAL HIGH (ref 80.0–100.0)
Monocytes Absolute: 0.7 10*3/uL (ref 0.1–1.0)
Monocytes Relative: 6 %
Neutro Abs: 10.6 10*3/uL — ABNORMAL HIGH (ref 1.7–7.7)
Neutrophils Relative %: 81 %
Platelets: 526 10*3/uL — ABNORMAL HIGH (ref 150–400)
RBC: 3.72 MIL/uL — ABNORMAL LOW (ref 3.87–5.11)
RDW: 13.2 % (ref 11.5–15.5)
WBC: 12.8 10*3/uL — ABNORMAL HIGH (ref 4.0–10.5)
nRBC: 0 % (ref 0.0–0.2)

## 2023-10-03 LAB — COMPREHENSIVE METABOLIC PANEL
ALT: 21 U/L (ref 0–44)
AST: 27 U/L (ref 15–41)
Albumin: 3.7 g/dL (ref 3.5–5.0)
Alkaline Phosphatase: 125 U/L (ref 38–126)
Anion gap: 11 (ref 5–15)
BUN: 9 mg/dL (ref 8–23)
CO2: 23 mmol/L (ref 22–32)
Calcium: 8.7 mg/dL — ABNORMAL LOW (ref 8.9–10.3)
Chloride: 100 mmol/L (ref 98–111)
Creatinine, Ser: 0.84 mg/dL (ref 0.44–1.00)
GFR, Estimated: >60 mL/min (ref >60)
Glucose, Bld: 125 mg/dL — ABNORMAL HIGH (ref 70–99)
Potassium: 4 mmol/L (ref 3.5–5.1)
Sodium: 134 mmol/L — ABNORMAL LOW (ref 135–145)
Total Bilirubin: 0.3 mg/dL (ref <1.2)
Total Protein: 7.2 g/dL (ref 6.5–8.1)

## 2023-10-03 LAB — RESP PANEL BY RT-PCR (RSV, FLU A&B, COVID)  RVPGX2
Influenza A by PCR: NEGATIVE
Influenza B by PCR: NEGATIVE
Resp Syncytial Virus by PCR: NEGATIVE
SARS Coronavirus 2 by RT PCR: NEGATIVE

## 2023-10-03 LAB — TROPONIN I (HIGH SENSITIVITY): Troponin I (High Sensitivity): 6 ng/L (ref <18)

## 2023-10-03 LAB — D-DIMER, QUANTITATIVE: D-Dimer, Quant: 2.13 ug{FEU}/mL — ABNORMAL HIGH (ref 0.00–0.50)

## 2023-10-03 MED ORDER — IOHEXOL 350 MG/ML SOLN
75.0000 mL | Freq: Once | INTRAVENOUS | Status: AC | PRN
  Administered 2023-10-03: 75 mL via INTRAVENOUS

## 2023-10-03 MED ORDER — HYDROXYZINE HCL 25 MG PO TABS: 25.0000 mg | ORAL_TABLET | Freq: Three times a day (TID) | ORAL | 0 refills | Status: DC | PRN

## 2023-10-03 MED ORDER — HYDROXYZINE HCL 25 MG PO TABS
25.0000 mg | ORAL_TABLET | Freq: Three times a day (TID) | ORAL | 0 refills | Status: DC | PRN
  Filled 2023-10-03: qty 30, 10d supply, fill #0

## 2023-10-03 NOTE — ED Triage Notes (Signed)
Patient presents with cough and sob. Reports feeling like she cannot catch her breath at times.   On 12/4 had a vein stripping procedure.  Reports history of anxiety

## 2023-10-03 NOTE — ED Triage Notes (Signed)
Pt in via EMS from home with c/o not feeling well.  Pt had a vein stripping procedure done on 12/4 here, has been doing fine and then Wednesday started getting a sore throat and has felt worse every day since.  140/47. HR 102 HR, 16 RR, 99.7 temp

## 2023-10-03 NOTE — ED Provider Notes (Signed)
[facility] Provider Note    Event Date/Time   First MD Initiated Contact with Patient 10/03/23 1922     (approximate)   History   Chief Complaint Shortness of Breath   HPI  Wanda Clayton is a 72 y.o. female with past medical history of hypertension, hyperlipidemia, CAD, and PAD who presents to the ED complaining of shortness of breath.  Patient reports that she has been dealing with a cough productive of clear sputum for the past 3 to 4 days.  She denies any fevers but began feeling short of breath earlier today, also denies any pain in her chest.  She underwent bilateral femoral endarterectomy along with placement of SFA stents bilaterally about 2 weeks ago, states that she has been doing well since then.  She states that the swelling in her legs has improved and she has not had any significant pain in her legs.  She does endorse some recent diarrhea, denies any nausea or vomiting.     Physical Exam   Triage Vital Signs: ED Triage Vitals  Encounter Vitals Group     BP 10/03/23 1604 (!) 140/122     Systolic BP Percentile --      Diastolic BP Percentile --      Pulse Rate 10/03/23 1604 (!) 101     Resp 10/03/23 1604 20     Temp 10/03/23 1604 98.6 F (37 C)     Temp Source 10/03/23 1604 Oral     SpO2 10/03/23 1604 99 %     Weight 10/03/23 1614 130 lb (59 kg)     Height 10/03/23 1614 5\' 3"  (1.6 m)     Head Circumference --      Peak Flow --      Pain Score 10/03/23 1613 0     Pain Loc --      Pain Education --      Exclude from Growth Chart --     Most recent vital signs: Vitals:   10/03/23 2000 10/03/23 2030  BP: (!) 141/65 (!) 120/58  Pulse: 99 100  Resp: (!) 30   Temp:    SpO2: 99% 98%    Constitutional: Alert and oriented. Eyes: Conjunctivae are normal. Head: Atraumatic. Nose: No congestion/rhinnorhea. Mouth/Throat: Mucous membranes are moist. Cardiovascular: Normal rate, regular rhythm. Grossly normal heart sounds.  2+ radial  pulses bilaterally. Respiratory: Normal respiratory effort.  No retractions. Lungs CTAB. Gastrointestinal: Soft and nontender. No distention. Musculoskeletal: No lower extremity tenderness, trace edema to bilateral lower extremities. Neurologic:  Normal speech and language. No gross focal neurologic deficits are appreciated.    ED Results / Procedures / Treatments   Labs (all labs ordered are listed, but only abnormal results are displayed) Labs Reviewed  CBC WITH DIFFERENTIAL/PLATELET - Abnormal; Notable for the following components:      Result Value   WBC 12.8 (*)    RBC 3.72 (*)    MCV 100.3 (*)    Platelets 526 (*)    Neutro Abs 10.6 (*)    All other components within normal limits  COMPREHENSIVE METABOLIC PANEL - Abnormal; Notable for the following components:   Sodium 134 (*)    Glucose, Bld 125 (*)    Calcium 8.7 (*)    All other components within normal limits  D-DIMER, QUANTITATIVE - Abnormal; Notable for the following components:   D-Dimer, Quant 2.13 (*)    All other components within normal limits  RESP PANEL BY RT-PCR (RSV, FLU  A&B, COVID)  RVPGX2  TROPONIN I (HIGH SENSITIVITY)     EKG  ED ECG REPORT I, Chesley Noon, the attending physician, personally viewed and interpreted this ECG.   Date: 10/03/2023  EKG Time: 16:08  Rate: 105  Rhythm: sinus tachycardia  Axis: Normal  Intervals:none  ST&T Change: None  RADIOLOGY Chest x-ray reviewed and interpreted by me with no infiltrate, edema, or effusion.  PROCEDURES:  Critical Care performed: No  Procedures   MEDICATIONS ORDERED IN ED: Medications  iohexol (OMNIPAQUE) 350 MG/ML injection 75 mL (75 mLs Intravenous Contrast Given 10/03/23 2014)     IMPRESSION / MDM / ASSESSMENT AND PLAN / ED COURSE  I reviewed the triage vital signs and the nursing notes.                              72 y.o. female with past medical history of hypertension, hyperlipidemia, CAD, and PAD who presents to the ED  complaining of cough adductive of clear sputum for the past few days, now with shortness of breath since this morning.  Patient's presentation is most consistent with acute presentation with potential threat to life or bodily function.  Differential diagnosis includes, but is not limited to, ACS, arrhythmia, PE, pneumonia, viral bronchitis, COVID-19, influenza, anemia, electrolyte abnormality, AKI.  Patient nontoxic-appearing and in no acute distress, vital signs are significant for tachycardia but otherwise reassuring.  Patient is not in any respiratory distress and maintaining oxygen saturations at 99% on room air.  EKG shows sinus tachycardia with no ischemic changes and labs show mild leukocytosis but no significant anemia, electrolyte abnormality, or AKI.  D-dimer is elevated and we will further assess with CTA chest to rule out PE.  Chest x-ray is unremarkable and troponin is pending at this time.  Troponin within normal limits and CTA chest is negative for PE or other acute process.  Patient feeling better on reassessment, denies ongoing difficulty breathing.  She states she believes her symptoms may be due to anxiety and is requesting medication to help with this.  While I told patient I am unable to prescribe a benzodiazepine for her, we may try Atarax as needed.  She states she previously felt better while taking an SSRI, was counseled to follow-up with her PCP to further discuss this.  She was counseled to return to the ED for new or worsening symptoms, patient agrees with plan.      FINAL CLINICAL IMPRESSION(S) / ED DIAGNOSES   Final diagnoses:  Shortness of breath  Anxiety     Rx / DC Orders   ED Discharge Orders          Ordered    hydrOXYzine (ATARAX) 25 MG tablet  3 times daily PRN,   Status:  Discontinued        10/03/23 2113    hydrOXYzine (ATARAX) 25 MG tablet  3 times daily PRN        10/03/23 2113             Note:  This document was prepared using Dragon  voice recognition software and may include unintentional dictation errors.   Chesley Noon, MD 10/03/23 2115

## 2023-10-04 ENCOUNTER — Other Ambulatory Visit: Payer: Self-pay

## 2023-10-13 ENCOUNTER — Telehealth: Payer: Self-pay

## 2023-10-13 ENCOUNTER — Other Ambulatory Visit (INDEPENDENT_AMBULATORY_CARE_PROVIDER_SITE_OTHER): Payer: Self-pay | Admitting: Vascular Surgery

## 2023-10-13 DIAGNOSIS — I739 Peripheral vascular disease, unspecified: Secondary | ICD-10-CM

## 2023-10-13 NOTE — Telephone Encounter (Signed)
 Wanda Clayton,  Patient had CT angio 10/03/2023 for shortness of breath in the ED.  She has asked if she would still need CT lung cancer screening January.  Can you advise?  [facility] team-please advise patient that I have routed this message to pulmonology for advice to see if CT angio from December would satisfy CT lung cancer screening

## 2023-10-13 NOTE — Telephone Encounter (Signed)
 Copied from CRM [id number]. Topic: General - Other >> Oct 13, 2023 10:34 AM Maisie C wrote: Reason for CRM: Pt called and explained that recently she was seen at [facility] ED for difficulty breathing which caused her to have a panic attack. While she was being seen, she had a CT scan done to check for blood clots. None was found and she was sent home. She wanted to ask her PCP if she will still need her routine CT scan on the 30th of Jan since she just had one done. She mentioned that she has a CT scan every January. Aside from this, pt already has a follow-up appt scheduled on 10/22/23. Pt asked for a callback at [phone]. Please call and advise.

## 2023-10-13 NOTE — Telephone Encounter (Signed)
 Spoke to pt and informed her that per message below as soon as we hear back we will let her know what they recommend   I have routed this message to pulmonology for advice to see if CT angio from December would satisfy CT lung cancer screening

## 2023-10-15 NOTE — Telephone Encounter (Signed)
 Spoke with Arlys John with radiology. Advises he will request an addendum be added to address lung nodules.

## 2023-10-16 ENCOUNTER — Encounter (INDEPENDENT_AMBULATORY_CARE_PROVIDER_SITE_OTHER): Payer: Self-pay | Admitting: Nurse Practitioner

## 2023-10-16 ENCOUNTER — Other Ambulatory Visit (INDEPENDENT_AMBULATORY_CARE_PROVIDER_SITE_OTHER): Payer: Self-pay | Admitting: Nurse Practitioner

## 2023-10-16 ENCOUNTER — Ambulatory Visit (INDEPENDENT_AMBULATORY_CARE_PROVIDER_SITE_OTHER): Payer: No Typology Code available for payment source

## 2023-10-16 ENCOUNTER — Ambulatory Visit (INDEPENDENT_AMBULATORY_CARE_PROVIDER_SITE_OTHER): Payer: No Typology Code available for payment source | Admitting: Nurse Practitioner

## 2023-10-16 VITALS — BP 118/71 | HR 92 | Resp 16 | Wt 136.2 lb

## 2023-10-16 DIAGNOSIS — Z9889 Other specified postprocedural states: Secondary | ICD-10-CM | POA: Diagnosis not present

## 2023-10-16 DIAGNOSIS — I739 Peripheral vascular disease, unspecified: Secondary | ICD-10-CM

## 2023-10-16 DIAGNOSIS — I70223 Atherosclerosis of native arteries of extremities with rest pain, bilateral legs: Secondary | ICD-10-CM

## 2023-10-18 ENCOUNTER — Encounter (INDEPENDENT_AMBULATORY_CARE_PROVIDER_SITE_OTHER): Payer: Self-pay | Admitting: Nurse Practitioner

## 2023-10-18 NOTE — Progress Notes (Signed)
 Subjective:    [name], female    DOB: 1951/04/10, 73 y.o.   MRN: [id number] Chief Complaint  Patient presents with   Routine [facility] 3 week staple removal    The patient returns to the office for followup and review status post angiogram with intervention on 09/16/2023.   Procedure: PROCEDURE: 1. Bilateral common femoral and superficial femoral endarterectomy with bovine pericardial patch angioplasty   The patient notes improvement in the lower extremity symptoms. No interval shortening of the patient's claudication distance or rest pain symptoms. No new ulcers or wounds have occurred since the last visit.  She is healing fairly well.  There have been no significant changes to the patient's overall health care.  No documented history of amaurosis fugax or recent TIA symptoms. There are no recent neurological changes noted. No documented history of DVT, PE or superficial thrombophlebitis. The patient denies recent episodes of angina or shortness of breath.   ABI's Rt=0.42 and Lt=0.59  (previous ABI's Rt=1.19 and Lt=1.11) Duplex US  of the bilateral tibial vessels reveals strong biphasic waveforms with good toe waveforms bilaterally    Review of Systems  Cardiovascular:  Positive for leg swelling.  Skin:  Positive for wound.  All other systems reviewed and are negative.      Objective:   Physical Exam Vitals reviewed.  HENT:     Head: Normocephalic.  Cardiovascular:     Rate and Rhythm: Normal rate.     Pulses:          Dorsalis pedis pulses are 1+ on the right side and 2+ on the left side.  Pulmonary:     Effort: Pulmonary effort is normal.  Skin:    General: Skin is warm and dry.  Neurological:     Mental Status: She is alert and oriented to person, place, and time.  Psychiatric:        Mood and Affect: Mood normal.        Behavior: Behavior normal.        Thought Content: Thought content normal.        Judgment: Judgment normal.      BP 118/71   Pulse 92   Resp 16   Wt 136 lb 3.2 oz (61.8 kg)   BMI 24.13 kg/m   Past Medical History:  Diagnosis Date   Acute pain of left foot 02/08/2020   Anxiety    Coronary artery disease    Depression    Dyspnea    Elevated liver enzymes 11/28/2020   High cholesterol    Hypertension    Peripheral vascular disease (HCC)     Social History   Socioeconomic History   Marital status: Married    Spouse name: Not on file   Number of children: 1   Years of education: Not on file   Highest education level: Not on file  Occupational History    Employer: [employer]  Tobacco Use   Smoking status: Every Day    Current packs/day: 0.50    Average packs/day: 0.5 packs/day for 36.0 years (18.0 ttl pk-yrs)    Types: Cigarettes   Smokeless tobacco: Never   Tobacco comments:    she plans to quit without medication  Vaping Use   Vaping status: Never Used  Substance and Sexual Activity   Alcohol use: Yes    Alcohol/week: 21.0 standard drinks of alcohol    Types: 21 Glasses of wine per week  Drug use: No   Sexual activity: Not Currently    Birth control/protection: None, Post-menopausal  Other Topics Concern   Not on file  Social History Narrative   Works for [employer], retired 06/2016   Married.   Daughter and granddaughter.          Social Drivers of Corporate Investment Banker Strain: Low Risk  (03/24/2023)   Overall Financial Resource Strain (CARDIA)    Difficulty of Paying Living Expenses: Not hard at all  Food Insecurity: No Food Insecurity (09/21/2023)   Hunger Vital Sign    Worried About Running Out of Food in the Last Year: Never true    Ran Out of Food in the Last Year: Never true  Transportation Needs: No Transportation Needs (09/21/2023)   PRAPARE - Administrator, Civil Service (Medical): No    Lack of Transportation (Non-Medical): No  Physical Activity: Insufficiently Active (03/24/2023)   Exercise Vital Sign    Days of Exercise  per Week: 2 days    Minutes of Exercise per Session: 20 min  Stress: No Stress Concern Present (03/24/2023)   [employer] of Occupational Health - Occupational Stress Questionnaire    Feeling of Stress : Only a little  Social Connections: Moderately Integrated (03/24/2023)   Social Connection and Isolation Panel [NHANES]    Frequency of Communication with Friends and Family: More than three times a week    Frequency of Social Gatherings with Friends and Family: Not on file    Attends Religious Services: More than 4 times per year    Active Member of [employer] or Organizations: No    Attends Banker Meetings: Never    Marital Status: Married  Catering Manager Violence: Not At Risk (09/21/2023)   Humiliation, Afraid, Rape, and Kick questionnaire    Fear of Current or Ex-Partner: No    Emotionally Abused: No    Physically Abused: No    Sexually Abused: No    Past Surgical History:  Procedure Laterality Date   BUNIONECTOMY Bilateral    carpal tunnel repair     COLONOSCOPY WITH PROPOFOL  N/A 08/19/2021   Procedure: COLONOSCOPY WITH PROPOFOL ;  Surgeon: Unk Corinn Skiff, MD;  Location: [facility] ENDOSCOPY;  Service: Gastroenterology;  Laterality: N/A;   DENTAL SURGERY     ENDARTERECTOMY FEMORAL Bilateral 09/16/2023   Procedure: ENDARTERECTOMY FEMORAL (BILATERAL SFA STENTS);  Surgeon: Jama Cordella MATSU, MD;  Location: [facility] ORS;  Service: Vascular;  Laterality: Bilateral;   FOOT SURGERY Right    LOWER EXTREMITY ANGIOGRAPHY Right 08/18/2023   Procedure: Lower Extremity Angiography;  Surgeon: Jama Cordella MATSU, MD;  Location: [facility] INVASIVE CV LAB;  Service: Cardiovascular;  Laterality: Right;   TUBAL LIGATION     VAGINAL DELIVERY     x1    Family History  Problem Relation Age of Onset   Hypertension Mother    Colon cancer Mother 49   Atrial fibrillation Mother    Dementia Mother    Heart disease Father    Diabetes Father    Colon cancer Father 30   Congestive Heart  Failure Father    COPD Sister    Crohn's disease Sister    Colon cancer Sister 15   Heart attack Sister    Heart disease Brother    Breast cancer Neg Hx     Allergies  Allergen Reactions   Cat Hair Extract    Pollen Extract Itching       Latest Ref Rng & Units  10/03/2023    4:23 PM 09/17/2023   12:01 AM 09/16/2023    3:19 PM  CBC  WBC 4.0 - 10.5 K/uL 12.8  9.8  9.0   Hemoglobin 12.0 - 15.0 g/dL 87.5  88.8  88.4   Hematocrit 36.0 - 46.0 % 37.3  32.7  33.9   Platelets 150 - 400 K/uL 526  178  197       CMP     Component Value Date/Time   NA 134 (L) 10/03/2023 1623   NA 140 04/19/2015 0000   K 4.0 10/03/2023 1623   CL 100 10/03/2023 1623   CO2 23 10/03/2023 1623   GLUCOSE 125 (H) 10/03/2023 1623   BUN 9 10/03/2023 1623   BUN 14 04/19/2015 0000   CREATININE 0.84 10/03/2023 1623   CALCIUM  8.7 (L) 10/03/2023 1623   PROT 7.2 10/03/2023 1623   PROT 7.1 02/24/2023 0919   ALBUMIN  3.7 10/03/2023 1623   ALBUMIN  4.4 02/24/2023 0919   AST 27 10/03/2023 1623   ALT 21 10/03/2023 1623   ALKPHOS 125 10/03/2023 1623   BILITOT 0.3 10/03/2023 1623   BILITOT 0.5 02/24/2023 0919   GFR 67.69 04/15/2023 1124   GFRNONAA >60 10/03/2023 1623     No results found.     Assessment & Plan:   1. 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  There is a few small areas of dehiscence in her bilateral groin wounds.  I have removed all sutures and the majority of staples today.  She handled this fairly well.  Her studies are much improved from prior to intervention.  We cultured the areas that are dehisced.  Will also have the patient utilize Medihoney in the wound and cover with Xeroform.  Will have the patient return in about a week or so to remove further staples.  I suspect that the swelling in the left groin is a seroma but we will also have an ultrasound done at that time to evaluate.   Current  Outpatient Medications on File Prior to Visit  Medication Sig Dispense Refill   acetaminophen  (TYLENOL ) 325 MG tablet Take 1-2 tablets (325-650 mg total) by mouth every 4 (four) hours as needed for mild pain (pain score 1-3) (or temp >/= 101 F).     amLODipine  (NORVASC ) 5 MG tablet Take 1 tablet (5 mg total) by mouth daily as needed. (Patient taking differently: Take 5 mg by mouth daily.) 90 tablet 3   aspirin  81 MG tablet Take 81 mg by mouth daily.     atorvastatin  (LIPITOR) 40 MG tablet TAKE 1 TABLET BY MOUTH DAILY AT 6 PM. 90 tablet 3   CALCIUM  PO Take 500 mg by mouth daily.     Cholecalciferol (VITAMIN D3 PO) Take 25 mcg by mouth daily.     clopidogrel  (PLAVIX ) 75 MG tablet Take 1 tablet (75 mg total) by mouth daily at 6 (six) AM. 30 tablet 3   Cyanocobalamin  (B-12 PO) Take 500 mcg by mouth daily.     ezetimibe  (ZETIA ) 10 MG tablet Take 1 tablet (10 mg total) by mouth daily. 90 tablet 3   hydrOXYzine  (ATARAX ) 25 MG tablet Take 1 tablet (25 mg total) by mouth 3 (three) times daily as needed. 30 tablet 0   meclizine  (ANTIVERT ) 12.5 MG tablet Take 1 tablet (12.5 mg total) by mouth 3 (three) times daily as needed (vertigo). 30 tablet 0   Multiple Vitamins-Minerals (EQ  MULTIVITAMINS ADULT GUMMY PO) Take 2 tablets by mouth daily.     oxyCODONE -acetaminophen  (PERCOCET/ROXICET) 5-325 MG tablet Take 1-2 tablets by mouth every 4 (four) hours as needed for moderate pain (pain score 4-6). 30 tablet 0   oxyCODONE -acetaminophen  (PERCOCET/ROXICET) 5-325 MG tablet Take 1-2 tablets by mouth every 4 (four) hours as needed for moderate pain 30 tablet 0   No current facility-administered medications on file prior to visit.    There are no Patient Instructions on file for this visit. No follow-ups on file.   Chadd Tollison E Prescilla Monger, NP

## 2023-10-19 ENCOUNTER — Other Ambulatory Visit: Payer: Self-pay

## 2023-10-19 DIAGNOSIS — Z87891 Personal history of nicotine dependence: Secondary | ICD-10-CM

## 2023-10-19 DIAGNOSIS — Z122 Encounter for screening for malignant neoplasm of respiratory organs: Secondary | ICD-10-CM

## 2023-10-19 DIAGNOSIS — F17210 Nicotine dependence, cigarettes, uncomplicated: Secondary | ICD-10-CM

## 2023-10-19 LAB — AEROBIC CULTURE

## 2023-10-19 NOTE — Telephone Encounter (Signed)
 noted

## 2023-10-20 LAB — VAS US ABI WITH/WO TBI
Left ABI: 1.11
Right ABI: 1.19

## 2023-10-22 ENCOUNTER — Other Ambulatory Visit (INDEPENDENT_AMBULATORY_CARE_PROVIDER_SITE_OTHER): Payer: Self-pay | Admitting: Nurse Practitioner

## 2023-10-22 ENCOUNTER — Ambulatory Visit (INDEPENDENT_AMBULATORY_CARE_PROVIDER_SITE_OTHER): Payer: No Typology Code available for payment source | Admitting: Family

## 2023-10-22 ENCOUNTER — Other Ambulatory Visit: Payer: Self-pay

## 2023-10-22 ENCOUNTER — Encounter: Payer: Self-pay | Admitting: Family

## 2023-10-22 ENCOUNTER — Telehealth: Payer: Self-pay | Admitting: Family

## 2023-10-22 VITALS — BP 128/78 | HR 87 | Temp 97.8°F | Ht 63.0 in | Wt 134.0 lb

## 2023-10-22 DIAGNOSIS — I739 Peripheral vascular disease, unspecified: Secondary | ICD-10-CM

## 2023-10-22 DIAGNOSIS — T8130XA Disruption of wound, unspecified, initial encounter: Secondary | ICD-10-CM

## 2023-10-22 DIAGNOSIS — R911 Solitary pulmonary nodule: Secondary | ICD-10-CM

## 2023-10-22 DIAGNOSIS — R1909 Other intra-abdominal and pelvic swelling, mass and lump: Secondary | ICD-10-CM

## 2023-10-22 MED ORDER — SULFAMETHOXAZOLE-TRIMETHOPRIM 800-160 MG PO TABS
1.0000 | ORAL_TABLET | Freq: Two times a day (BID) | ORAL | 0 refills | Status: DC
  Filled 2023-10-22: qty 14, 7d supply, fill #0

## 2023-10-22 MED ORDER — MUPIROCIN 2 % EX OINT
1.0000 | TOPICAL_OINTMENT | Freq: Two times a day (BID) | CUTANEOUS | 2 refills | Status: DC
  Filled 2023-10-22: qty 22, 10d supply, fill #0

## 2023-10-22 NOTE — Addendum Note (Signed)
 Addended by: Allegra Grana on: 10/22/2023 01:41 PM   Modules accepted: Orders, Level of Service

## 2023-10-22 NOTE — Patient Instructions (Addendum)
 Keep wound covered and change dressing daily. You may stop use of medihoney Continue Medihoney. Start oral antibiotic Bactrim ; I sent in Bactroban  ointment however you do not need to take this.    Ensure to take probiotics while on antibiotics and also for 2 weeks after completion. This can either be by eating yogurt daily or taking a probiotic supplement over the counter such as Culturelle.It is important to re-colonize the gut with good bacteria and also to prevent any diarrheal infections associated with antibiotic use.    Pending wound culture.   See instructions wound care   Please stay vigilant for any concerns of increasing pain, tenderness purulent discharge or fever, the symptoms would warrant reevaluation in the emergency room

## 2023-10-22 NOTE — Assessment & Plan Note (Addendum)
 Afebrile. Patient nontoxic in appearance. Foul odor present when removing dressing. No purulent discharge from wound. Left wound dehiscence and presence of slough.  Provided patient with bactroban  to start. Wound bandage changed. Pending wound culture and she has follow up with vascular in 5 days. I have shared chart with vascular team for further advice ahead of appointment.   Encouraged use of Tylenol , scheduled doses, for pain control  Addendum: Collaborated with Orvin Daring over secure chat.  She advised to start Bactrim  and educate patient on wet-to-dry dressing. I have dced bactroban .   Encouraged continued use of Medihoney. Close follow up 4 days for recheck.  Strict return precautions given to patient which would warrant reevaluation over the weekend.

## 2023-10-22 NOTE — Assessment & Plan Note (Signed)
 Collaborated with pulmonology, radiology overread showing 4 mm left lower lung nodule of CTA 10/03/23; Pulmonology advised repeat CT chest 09/2024

## 2023-10-22 NOTE — Progress Notes (Addendum)
 Assessment & Plan:  Wound dehiscence Assessment & Plan: Afebrile. Patient nontoxic in appearance. Foul odor present when removing dressing. No purulent discharge from wound. Left wound dehiscence and presence of slough.  Provided patient with bactroban  to start. Wound bandage changed. Pending wound culture and she has follow up with vascular in 5 days. I have shared chart with vascular team for further advice ahead of appointment.   Encouraged use of Tylenol , scheduled doses, for pain control  Addendum: Collaborated with Orvin Daring over secure chat.  She advised to start Bactrim  and educate patient on wet-to-dry dressing. I have dced bactroban .   Encouraged continued use of Medihoney. Close follow up 4 days for recheck.  Strict return precautions given to patient which would warrant reevaluation over the weekend.   Orders: -     WOUND CULTURE -     Sulfamethoxazole -Trimethoprim ; Take 1 tablet by mouth 2 (two) times daily for 7 days.  Dispense: 14 tablet; Refill: 0  Lung nodule Assessment & Plan: Collaborated with pulmonology, radiology overread showing 4 mm left lower lung nodule of CTA 10/03/23; Pulmonology advised repeat CT chest 09/2024      Return precautions given.   Risks, benefits, and alternatives of the medications and treatment plan prescribed today were discussed, and patient expressed understanding.   Education regarding symptom management and diagnosis given to patient on AVS either electronically or printed.  No follow-ups on file.  Wanda Northern, FNP I have spent 40 minutes with a patient including precharting, exam, reviewing medical records, and discussion plan of care.     Subjective:    [name], female    DOB: 1951-03-30, 73 y.o.   MRN: [id number]  CC: Wanda Clayton is a 73 y.o. female who presents today for follow up.   HPI: Accompanied by husband  Right wound has healed.  She is concerned for infection, left wound and dehiscence.   She reports foul odor. Denies fever, purulent discharge.   She is pain in bilateral groin however is not taking oxycodone  due to sedation.   She is using medihoney and keeping wound covered with Xeroform.    SOB has resolved.   Denies cough, leg sweling   Follow-up vascular 10/16/2023 Sutures removed.  Culture of the area that are dehisced ( routine flora).  Advised Medihoney and to cover with Xeroform.  Status post angiogram with intervention 09/16/2023 Bilateral common femoral and superficial femoral endarterectomy with bovine pericardial patch angioplasty  Presented to the emergency room 10/03/2023 for shortness of breath, cough. D dimer 2.13 WBC 12.8 Chest x-ray without acute findings Troponin 6 Negative COVID, flu, RSV CTA negative for pulmonary embolism 4 mm left lower lobe lung nodule; extensive coronary artery and aortic atherosclerosis  Prescribed Atarax  25 mg 3 times daily for anxiety  09/14/2023 cardiology consult for coronary artery calcification; no medication changes  Allergies: Cat hair extract and Pollen extract Current Outpatient Medications on File Prior to Visit  Medication Sig Dispense Refill   acetaminophen  (TYLENOL ) 325 MG tablet Take 1-2 tablets (325-650 mg total) by mouth every 4 (four) hours as needed for mild pain (pain score 1-3) (or temp >/= 101 F).     amLODipine  (NORVASC ) 5 MG tablet Take 1 tablet (5 mg total) by mouth daily as needed. (Patient taking differently: Take 5 mg by mouth daily.) 90 tablet 3   aspirin  81 MG tablet Take 81 mg by mouth daily.     atorvastatin  (LIPITOR) 40 MG tablet TAKE 1 TABLET  BY MOUTH DAILY AT 6 PM. 90 tablet 3   CALCIUM  PO Take 500 mg by mouth daily.     Cholecalciferol (VITAMIN D3 PO) Take 25 mcg by mouth daily.     clopidogrel  (PLAVIX ) 75 MG tablet Take 1 tablet (75 mg total) by mouth daily at 6 (six) AM. 30 tablet 3   Cyanocobalamin  (B-12 PO) Take 500 mcg by mouth daily.     ezetimibe  (ZETIA ) 10 MG tablet Take 1  tablet (10 mg total) by mouth daily. 90 tablet 3   hydrOXYzine  (ATARAX ) 25 MG tablet Take 1 tablet (25 mg total) by mouth 3 (three) times daily as needed. 30 tablet 0   meclizine  (ANTIVERT ) 12.5 MG tablet Take 1 tablet (12.5 mg total) by mouth 3 (three) times daily as needed (vertigo). 30 tablet 0   Multiple Vitamins-Minerals (EQ MULTIVITAMINS ADULT GUMMY PO) Take 2 tablets by mouth daily.     oxyCODONE -acetaminophen  (PERCOCET/ROXICET) 5-325 MG tablet Take 1-2 tablets by mouth every 4 (four) hours as needed for moderate pain (pain score 4-6). 30 tablet 0   oxyCODONE -acetaminophen  (PERCOCET/ROXICET) 5-325 MG tablet Take 1-2 tablets by mouth every 4 (four) hours as needed for moderate pain 30 tablet 0   No current facility-administered medications on file prior to visit.    Review of Systems  Constitutional:  Negative for chills and fever.  Respiratory:  Negative for cough.   Cardiovascular:  Negative for chest pain and palpitations.  Gastrointestinal:  Negative for nausea and vomiting.  Skin:  Positive for wound.      Objective:    BP 128/78   Pulse 87   Temp 97.8 F (36.6 C) (Oral)   Ht 5' 3 (1.6 m)   Wt 134 lb (60.8 kg)   SpO2 98%   BMI 23.74 kg/m  BP Readings from Last 3 Encounters:  10/22/23 128/78  10/16/23 118/71  10/03/23 125/83   Wt Readings from Last 3 Encounters:  10/22/23 134 lb (60.8 kg)  10/16/23 136 lb 3.2 oz (61.8 kg)  10/03/23 130 lb (59 kg)    Physical Exam Vitals reviewed.  Constitutional:      Appearance: She is well-developed.  Eyes:     Conjunctiva/sclera: Conjunctivae normal.  Cardiovascular:     Rate and Rhythm: Normal rate and regular rhythm.     Pulses: Normal pulses.     Heart sounds: Normal heart sounds.  Pulmonary:     Effort: Pulmonary effort is normal.     Breath sounds: Normal breath sounds. No wheezing, rhonchi or rales.  Musculoskeletal:     Right lower leg: No edema.     Left lower leg: No edema.  Skin:    General: Skin is  warm and dry.     Comments: Left groin 2 cm wound dehiscence.  Foul odor.  Slough present.  No purulent discharge.  Tenderness with palpation.   Neurological:     Mental Status: She is alert.  Psychiatric:        Speech: Speech normal.        Behavior: Behavior normal.        Thought Content: Thought content normal.

## 2023-10-23 NOTE — Telephone Encounter (Signed)
 close

## 2023-10-26 ENCOUNTER — Encounter: Payer: Self-pay | Admitting: Family

## 2023-10-26 ENCOUNTER — Ambulatory Visit (INDEPENDENT_AMBULATORY_CARE_PROVIDER_SITE_OTHER): Payer: No Typology Code available for payment source | Admitting: Family

## 2023-10-26 VITALS — BP 124/82 | HR 74 | Temp 97.4°F | Ht 64.0 in | Wt 133.6 lb

## 2023-10-26 DIAGNOSIS — T8130XA Disruption of wound, unspecified, initial encounter: Secondary | ICD-10-CM | POA: Diagnosis not present

## 2023-10-26 LAB — WOUND CULTURE
MICRO NUMBER:: 15938201
SPECIMEN QUALITY:: ADEQUATE

## 2023-10-26 NOTE — Assessment & Plan Note (Addendum)
 No systemic features.  Improvement in wound closure (see picture from 10/22/23) .  Would cleaned with sterile saline and dressed wet to dry.  wound culture with Staphylococcus pseudintermedius. Patient is on appropriate antibiotic ,Bactrim  , and advised her to complete. Vascular follow up tomorrow. Will follow.

## 2023-10-26 NOTE — Patient Instructions (Signed)
 Please schedule Tylenol the same front of your postoperative pain.  You may also trial taking half tablet of Percocet for pain relief.   Please let me know how you are doing

## 2023-10-26 NOTE — Progress Notes (Signed)
 Assessment & Plan:  Wound dehiscence Assessment & Plan: No systemic features.  Improvement in wound closure (see picture from 10/22/23) .  Would cleaned with sterile saline and dressed wet to dry.  wound culture with Staphylococcus pseudintermedius. Patient is on appropriate antibiotic ,Bactrim  , and advised her to complete. Vascular follow up tomorrow. Will follow.       Return precautions given.   Risks, benefits, and alternatives of the medications and treatment plan prescribed today were discussed, and patient expressed understanding.   Education regarding symptom management and diagnosis given to patient on AVS either electronically or printed.  Return in about 1 month (around 11/26/2023).  Rollene Northern, FNP  Subjective:    [name], female    DOB: 03-Jun-1951, 73 y.o.   MRN: [id number]  CC: Wanda Clayton is a 73 y.o. female who presents today for follow up.   HPI: Accompanied by husband  Here for right groin wound recheck  Malodor somewhat decreased and/or change.  No fever, purulent discharge. She is changing dressing 3 times daily.  She total Tylenol  intermittently with relief of pain.  She has not yet taken Percocet as concerned with sedation side effects of medication    Compliant with Bactrim  7 day course started 5 days ago Left groin continues to be quite painful  Follow-up scheduled tomorrow with vascular  Wound culture with Staphylococcus pseudintermedius   Allergies: Cat hair extract and Pollen extract Current Outpatient Medications on File Prior to Visit  Medication Sig Dispense Refill   acetaminophen  (TYLENOL ) 325 MG tablet Take 1-2 tablets (325-650 mg total) by mouth every 4 (four) hours as needed for mild pain (pain score 1-3) (or temp >/= 101 F).     amLODipine  (NORVASC ) 5 MG tablet Take 1 tablet (5 mg total) by mouth daily as needed. (Patient taking differently: Take 5 mg by mouth daily.) 90 tablet 3   aspirin  81 MG tablet Take 81  mg by mouth daily.     atorvastatin  (LIPITOR) 40 MG tablet TAKE 1 TABLET BY MOUTH DAILY AT 6 PM. 90 tablet 3   CALCIUM  PO Take 500 mg by mouth daily.     Cholecalciferol (VITAMIN D3 PO) Take 25 mcg by mouth daily.     clopidogrel  (PLAVIX ) 75 MG tablet Take 1 tablet (75 mg total) by mouth daily at 6 (six) AM. 30 tablet 3   Cyanocobalamin  (B-12 PO) Take 500 mcg by mouth daily.     ezetimibe  (ZETIA ) 10 MG tablet Take 1 tablet (10 mg total) by mouth daily. 90 tablet 3   hydrOXYzine  (ATARAX ) 25 MG tablet Take 1 tablet (25 mg total) by mouth 3 (three) times daily as needed. 30 tablet 0   meclizine  (ANTIVERT ) 12.5 MG tablet Take 1 tablet (12.5 mg total) by mouth 3 (three) times daily as needed (vertigo). 30 tablet 0   Multiple Vitamins-Minerals (EQ MULTIVITAMINS ADULT GUMMY PO) Take 2 tablets by mouth daily.     oxyCODONE -acetaminophen  (PERCOCET/ROXICET) 5-325 MG tablet Take 1-2 tablets by mouth every 4 (four) hours as needed for moderate pain (pain score 4-6). 30 tablet 0   oxyCODONE -acetaminophen  (PERCOCET/ROXICET) 5-325 MG tablet Take 1-2 tablets by mouth every 4 (four) hours as needed for moderate pain 30 tablet 0   sulfamethoxazole -trimethoprim  (BACTRIM  DS) 800-160 MG tablet Take 1 tablet by mouth 2 (two) times daily for 7 days. 14 tablet 0   No current facility-administered medications on file prior to visit.    Review of Systems  Constitutional:  Negative for chills and fever.  Respiratory:  Negative for cough.   Cardiovascular:  Negative for chest pain and palpitations.  Gastrointestinal:  Negative for nausea and vomiting.  Skin:  Positive for wound.      Objective:    BP 124/82   Pulse 74   Temp (!) 97.4 F (36.3 C) (Oral)   Ht 5' 4 (1.626 m)   Wt 133 lb 9.6 oz (60.6 kg)   SpO2 96%   BMI 22.93 kg/m  BP Readings from Last 3 Encounters:  10/26/23 124/82  10/22/23 128/78  10/16/23 118/71   Wt Readings from Last 3 Encounters:  10/26/23 133 lb 9.6 oz (60.6 kg)  10/22/23 134  lb (60.8 kg)  10/16/23 136 lb 3.2 oz (61.8 kg)    Physical Exam Vitals reviewed.  Constitutional:      Appearance: She is well-developed.  Eyes:     Conjunctiva/sclera: Conjunctivae normal.  Cardiovascular:     Rate and Rhythm: Normal rate and regular rhythm.     Pulses: Normal pulses.     Heart sounds: Normal heart sounds.  Pulmonary:     Effort: Pulmonary effort is normal.     Breath sounds: Normal breath sounds. No wheezing, rhonchi or rales.  Skin:    General: Skin is warm and dry.     Comments: Right surgical incision well-approximated without increased heat, erythema. left flank wound dehiscence.  Slough present.  No purulent discharge, red streaks, increased warmth.    scant odor.    Neurological:     Mental Status: She is alert.  Psychiatric:        Speech: Speech normal.        Behavior: Behavior normal.        Thought Content: Thought content normal.

## 2023-10-27 ENCOUNTER — Encounter (INDEPENDENT_AMBULATORY_CARE_PROVIDER_SITE_OTHER): Payer: Self-pay | Admitting: Nurse Practitioner

## 2023-10-27 ENCOUNTER — Ambulatory Visit (INDEPENDENT_AMBULATORY_CARE_PROVIDER_SITE_OTHER): Payer: No Typology Code available for payment source | Admitting: Nurse Practitioner

## 2023-10-27 ENCOUNTER — Other Ambulatory Visit: Payer: Self-pay

## 2023-10-27 ENCOUNTER — Ambulatory Visit (INDEPENDENT_AMBULATORY_CARE_PROVIDER_SITE_OTHER): Payer: No Typology Code available for payment source

## 2023-10-27 DIAGNOSIS — R1909 Other intra-abdominal and pelvic swelling, mass and lump: Secondary | ICD-10-CM | POA: Diagnosis not present

## 2023-10-27 DIAGNOSIS — I739 Peripheral vascular disease, unspecified: Secondary | ICD-10-CM | POA: Diagnosis not present

## 2023-10-27 DIAGNOSIS — Z9889 Other specified postprocedural states: Secondary | ICD-10-CM

## 2023-10-27 DIAGNOSIS — T8130XA Disruption of wound, unspecified, initial encounter: Secondary | ICD-10-CM

## 2023-10-27 MED ORDER — SULFAMETHOXAZOLE-TRIMETHOPRIM 800-160 MG PO TABS
1.0000 | ORAL_TABLET | Freq: Two times a day (BID) | ORAL | 0 refills | Status: AC
  Filled 2023-10-27: qty 14, 7d supply, fill #0

## 2023-10-27 NOTE — Progress Notes (Signed)
 Subjective:    [name], female    DOB: 1950-12-29, 73 y.o.   MRN: [id number] Chief Complaint  Patient presents with   Follow-up    1-2 week left groin check    The patient returns to the office for followup and review status post angiogram with intervention on 09/16/2023.   Procedure: PROCEDURE: 1. Bilateral common femoral and superficial femoral endarterectomy with bovine pericardial patch angioplasty   The patient notes improvement in the lower extremity symptoms. No interval shortening of the patient's claudication distance or rest pain symptoms. No new ulcers or wounds have occurred since the last visit.    Since her last visit she has had some dehiscence in her left groin area.  She was started on Bactrim  by her primary care provider and does not appear to have significant infection.  The wound area has some significant fibrinous exudate.  Probing reveals no tunneling currently.  There have been no significant changes to the patient's overall health care.  No documented history of amaurosis fugax or recent TIA symptoms. There are no recent neurological changes noted. No documented history of DVT, PE or superficial thrombophlebitis. The patient denies recent episodes of angina or shortness of breath.   ABI's Rt=1.19 and Lt=1.11  (previous ABI's Rt=0.42 and Lt=0.59) Duplex US  of the bilateral tibial vessels reveals strong biphasic waveforms with good toe waveforms bilaterally  The patient also had a pseudoaneurysm study done today given swelling in the right groin and it is revealed to be a seroma with no flow seen within.    Review of Systems  Cardiovascular:  Positive for leg swelling.  Skin:  Positive for wound.  All other systems reviewed and are negative.      Objective:   Physical Exam Vitals reviewed.  HENT:     Head: Normocephalic.  Cardiovascular:     Rate and Rhythm: Normal rate.     Pulses:          Dorsalis pedis pulses are 1+ on the  right side and 2+ on the left side.  Pulmonary:     Effort: Pulmonary effort is normal.  Skin:    General: Skin is warm and dry.  Neurological:     Mental Status: She is alert and oriented to person, place, and time.  Psychiatric:        Mood and Affect: Mood normal.        Behavior: Behavior normal.        Thought Content: Thought content normal.        Judgment: Judgment normal.     BP (!) 140/75   Pulse 95   Resp 15   Wt 135 lb (61.2 kg)   BMI 23.17 kg/m   Past Medical History:  Diagnosis Date   Acute pain of left foot 02/08/2020   Anxiety    Coronary artery disease    Depression    Dyspnea    Elevated liver enzymes 11/28/2020   High cholesterol    Hypertension    Peripheral vascular disease (HCC)     Social History   Socioeconomic History   Marital status: Married    Spouse name: Not on file   Number of children: 1   Years of education: Not on file   Highest education level: Not on file  Occupational History    Employer: [employer]  Tobacco Use   Smoking status: Every Day    Current packs/day: 0.50    Average packs/day:  0.5 packs/day for 36.0 years (18.0 ttl pk-yrs)    Types: Cigarettes   Smokeless tobacco: Never   Tobacco comments:    she plans to quit without medication  Vaping Use   Vaping status: Never Used  Substance and Sexual Activity   Alcohol use: Yes    Alcohol/week: 21.0 standard drinks of alcohol    Types: 21 Glasses of wine per week   Drug use: No   Sexual activity: Not Currently    Birth control/protection: None, Post-menopausal  Other Topics Concern   Not on file  Social History Narrative   Works for [employer], retired 06/2016   Married.   Daughter and granddaughter.          Social Drivers of Corporate Investment Banker Strain: Low Risk  (03/24/2023)   Overall Financial Resource Strain (CARDIA)    Difficulty of Paying Living Expenses: Not hard at all  Food Insecurity: No Food Insecurity (09/21/2023)   Hunger Vital  Sign    Worried About Running Out of Food in the Last Year: Never true    Ran Out of Food in the Last Year: Never true  Transportation Needs: No Transportation Needs (09/21/2023)   PRAPARE - Administrator, Civil Service (Medical): No    Lack of Transportation (Non-Medical): No  Physical Activity: Insufficiently Active (03/24/2023)   Exercise Vital Sign    Days of Exercise per Week: 2 days    Minutes of Exercise per Session: 20 min  Stress: No Stress Concern Present (03/24/2023)   [employer] of Occupational Health - Occupational Stress Questionnaire    Feeling of Stress : Only a little  Social Connections: Moderately Integrated (03/24/2023)   Social Connection and Isolation Panel [NHANES]    Frequency of Communication with Friends and Family: More than three times a week    Frequency of Social Gatherings with Friends and Family: Not on file    Attends Religious Services: More than 4 times per year    Active Member of [employer] or Organizations: No    Attends Banker Meetings: Never    Marital Status: Married  Catering Manager Violence: Not At Risk (09/21/2023)   Humiliation, Afraid, Rape, and Kick questionnaire    Fear of Current or Ex-Partner: No    Emotionally Abused: No    Physically Abused: No    Sexually Abused: No    Past Surgical History:  Procedure Laterality Date   BUNIONECTOMY Bilateral    carpal tunnel repair     COLONOSCOPY WITH PROPOFOL  N/A 08/19/2021   Procedure: COLONOSCOPY WITH PROPOFOL ;  Surgeon: Unk Corinn Skiff, MD;  Location: [facility] ENDOSCOPY;  Service: Gastroenterology;  Laterality: N/A;   DENTAL SURGERY     ENDARTERECTOMY FEMORAL Bilateral 09/16/2023   Procedure: ENDARTERECTOMY FEMORAL (BILATERAL SFA STENTS);  Surgeon: Jama Cordella MATSU, MD;  Location: [facility] ORS;  Service: Vascular;  Laterality: Bilateral;   FOOT SURGERY Right    LOWER EXTREMITY ANGIOGRAPHY Right 08/18/2023   Procedure: Lower Extremity Angiography;  Surgeon: Jama Cordella MATSU, MD;  Location: [facility] INVASIVE CV [facility];  Service: Cardiovascular;  Laterality: Right;   TUBAL LIGATION     VAGINAL DELIVERY     x1    Family History  Problem Relation Age of Onset   Hypertension Mother    Colon cancer Mother 52   Atrial fibrillation Mother    Dementia Mother    Heart disease Father    Diabetes Father    Colon cancer Father 93  Congestive Heart Failure Father    COPD Sister    Crohn's disease Sister    Colon cancer Sister 51   Heart attack Sister    Heart disease Brother    Breast cancer Neg Hx     Allergies  Allergen Reactions   Cat Hair Extract    Pollen Extract Itching       Latest Ref Rng & Units 10/03/2023    4:23 PM 09/17/2023   12:01 AM 09/16/2023    3:19 PM  CBC  WBC 4.0 - 10.5 K/uL 12.8  9.8  9.0   Hemoglobin 12.0 - 15.0 g/dL 87.5  88.8  88.4   Hematocrit 36.0 - 46.0 % 37.3  32.7  33.9   Platelets 150 - 400 K/uL 526  178  197       CMP     Component Value Date/Time   NA 134 (L) 10/03/2023 1623   NA 140 04/19/2015 0000   K 4.0 10/03/2023 1623   CL 100 10/03/2023 1623   CO2 23 10/03/2023 1623   GLUCOSE 125 (H) 10/03/2023 1623   BUN 9 10/03/2023 1623   BUN 14 04/19/2015 0000   CREATININE 0.84 10/03/2023 1623   CALCIUM  8.7 (L) 10/03/2023 1623   PROT 7.2 10/03/2023 1623   PROT 7.1 02/24/2023 0919   ALBUMIN  3.7 10/03/2023 1623   ALBUMIN  4.4 02/24/2023 0919   AST 27 10/03/2023 1623   ALT 21 10/03/2023 1623   ALKPHOS 125 10/03/2023 1623   BILITOT 0.3 10/03/2023 1623   BILITOT 0.5 02/24/2023 0919   GFR 67.69 04/15/2023 1124   GFRNONAA >60 10/03/2023 1623     No results found.     Assessment & Plan:   1. 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  With the dehiscence in the left groin I have indicated for the patient to use Medihoney in the wound bed with wet-to-dry dressings 3 times per day.  There is a small opening distally in  the wound as she should utilize Medihoney on that and cover with gauze.  The right groin has a scabbed area distally and so we will proceed to leave this open area no need for wet-to-dry in this area.  Currently the patient is not getting any home health.  We will try to see if we can find out home health agency for the patient with wound monitoring.  Will plan to have her return in 2 to 3 weeks.  I have also sent a refill of Bactrim  in case her wound begins to worsen or as foul-smelling drainage again at which point she can start this   Current Outpatient Medications on File Prior to Visit  Medication Sig Dispense Refill   acetaminophen  (TYLENOL ) 325 MG tablet Take 1-2 tablets (325-650 mg total) by mouth every 4 (four) hours as needed for mild pain (pain score 1-3) (or temp >/= 101 F).     amLODipine  (NORVASC ) 5 MG tablet Take 1 tablet (5 mg total) by mouth daily as needed. (Patient taking differently: Take 5 mg by mouth daily.) 90 tablet 3   aspirin  81 MG tablet Take 81 mg by mouth daily.     atorvastatin  (LIPITOR) 40 MG tablet TAKE 1 TABLET BY MOUTH DAILY AT 6 PM. 90 tablet 3   CALCIUM  PO Take 500 mg by mouth daily.     Cholecalciferol (VITAMIN D3 PO) Take 25 mcg by mouth daily.  clopidogrel  (PLAVIX ) 75 MG tablet Take 1 tablet (75 mg total) by mouth daily at 6 (six) AM. 30 tablet 3   Cyanocobalamin  (B-12 PO) Take 500 mcg by mouth daily.     ezetimibe  (ZETIA ) 10 MG tablet Take 1 tablet (10 mg total) by mouth daily. 90 tablet 3   hydrOXYzine  (ATARAX ) 25 MG tablet Take 1 tablet (25 mg total) by mouth 3 (three) times daily as needed. 30 tablet 0   meclizine  (ANTIVERT ) 12.5 MG tablet Take 1 tablet (12.5 mg total) by mouth 3 (three) times daily as needed (vertigo). 30 tablet 0   Multiple Vitamins-Minerals (EQ MULTIVITAMINS ADULT GUMMY PO) Take 2 tablets by mouth daily.     oxyCODONE -acetaminophen  (PERCOCET/ROXICET) 5-325 MG tablet Take 1-2 tablets by mouth every 4 (four) hours as needed for moderate  pain (pain score 4-6). 30 tablet 0   oxyCODONE -acetaminophen  (PERCOCET/ROXICET) 5-325 MG tablet Take 1-2 tablets by mouth every 4 (four) hours as needed for moderate pain 30 tablet 0   No current facility-administered medications on file prior to visit.    There are no Patient Instructions on file for this visit. No follow-ups on file.   Tamie Minteer E Dai Apel, NP

## 2023-10-28 ENCOUNTER — Encounter (INDEPENDENT_AMBULATORY_CARE_PROVIDER_SITE_OTHER): Payer: Self-pay | Admitting: Nurse Practitioner

## 2023-10-29 ENCOUNTER — Telehealth (INDEPENDENT_AMBULATORY_CARE_PROVIDER_SITE_OTHER): Payer: Self-pay

## 2023-10-29 NOTE — Telephone Encounter (Signed)
Patient was notified that wound care orders have been received by [facility] home [facility] for wound care and that someone will be contacting her to schedule nursing visits.

## 2023-11-02 NOTE — Telephone Encounter (Signed)
Wanda Clayton with [facility] home [facility] left a message requesting orders for wound care. Verbal orders were received for wet to dry dressing with medihoney in left left groin, changed 3 times weekly. Neysa Bonito will reach if she need additionally orders

## 2023-11-05 ENCOUNTER — Telehealth (INDEPENDENT_AMBULATORY_CARE_PROVIDER_SITE_OTHER): Payer: Self-pay

## 2023-11-05 NOTE — Telephone Encounter (Signed)
Kim from [facility] reach out to the requesting verbal orders to start begin service on 11/10/23 due to insurance authorization. Selena Batten stated that if authorization returns sooner they will request for a sooner start date.

## 2023-11-10 DIAGNOSIS — I10 Essential (primary) hypertension: Secondary | ICD-10-CM | POA: Diagnosis not present

## 2023-11-10 DIAGNOSIS — E785 Hyperlipidemia, unspecified: Secondary | ICD-10-CM | POA: Diagnosis not present

## 2023-11-10 DIAGNOSIS — F411 Generalized anxiety disorder: Secondary | ICD-10-CM | POA: Diagnosis not present

## 2023-11-10 DIAGNOSIS — I739 Peripheral vascular disease, unspecified: Secondary | ICD-10-CM | POA: Diagnosis not present

## 2023-11-10 DIAGNOSIS — I2510 Atherosclerotic heart disease of native coronary artery without angina pectoris: Secondary | ICD-10-CM | POA: Diagnosis not present

## 2023-11-10 DIAGNOSIS — F32A Depression, unspecified: Secondary | ICD-10-CM | POA: Diagnosis not present

## 2023-11-11 ENCOUNTER — Other Ambulatory Visit: Payer: Self-pay

## 2023-11-12 ENCOUNTER — Ambulatory Visit: Payer: No Typology Code available for payment source

## 2023-11-12 DIAGNOSIS — I739 Peripheral vascular disease, unspecified: Secondary | ICD-10-CM | POA: Diagnosis not present

## 2023-11-12 DIAGNOSIS — I10 Essential (primary) hypertension: Secondary | ICD-10-CM | POA: Diagnosis not present

## 2023-11-12 DIAGNOSIS — F411 Generalized anxiety disorder: Secondary | ICD-10-CM | POA: Diagnosis not present

## 2023-11-12 DIAGNOSIS — E785 Hyperlipidemia, unspecified: Secondary | ICD-10-CM | POA: Diagnosis not present

## 2023-11-12 DIAGNOSIS — I2510 Atherosclerotic heart disease of native coronary artery without angina pectoris: Secondary | ICD-10-CM | POA: Diagnosis not present

## 2023-11-12 DIAGNOSIS — F32A Depression, unspecified: Secondary | ICD-10-CM | POA: Diagnosis not present

## 2023-11-13 ENCOUNTER — Encounter (INDEPENDENT_AMBULATORY_CARE_PROVIDER_SITE_OTHER): Payer: Self-pay | Admitting: Nurse Practitioner

## 2023-11-13 ENCOUNTER — Ambulatory Visit (INDEPENDENT_AMBULATORY_CARE_PROVIDER_SITE_OTHER): Payer: No Typology Code available for payment source | Admitting: Nurse Practitioner

## 2023-11-13 VITALS — BP 153/78 | HR 98 | Resp 16 | Wt 136.6 lb

## 2023-11-13 DIAGNOSIS — T8130XA Disruption of wound, unspecified, initial encounter: Secondary | ICD-10-CM

## 2023-11-13 NOTE — Progress Notes (Signed)
Subjective:    [name], female    DOB: 12/30/1950, 73 y.o.   MRN: [id number] Chief Complaint  Patient presents with   Follow-up    2 week wound check    The patient returns to the office for followup and review status post angiogram with intervention on 09/16/2023.   Procedure: PROCEDURE: 1. Bilateral common femoral and superficial femoral endarterectomy with bovine pericardial patch angioplasty   The patient's wound has improved quite nicely since her last visit.  There is still an open area in the groin but there is much less fibrinous exudate and there is no evidence of infection or foul-smelling odor.  The periwound area is also much improved.  The depth is decreased as well as the overall size.  The patient notes that she is feeling better she has some swelling in the left leg.  She is able to be up and a little bit more active than previous.    Review of Systems  Cardiovascular:  Positive for leg swelling.  Skin:  Positive for wound.  All other systems reviewed and are negative.      Objective:   Physical Exam Vitals reviewed.  HENT:     Head: Normocephalic.  Cardiovascular:     Rate and Rhythm: Normal rate.     Pulses:          Dorsalis pedis pulses are 1+ on the right side and 2+ on the left side.  Pulmonary:     Effort: Pulmonary effort is normal.  Skin:    General: Skin is warm and dry.  Neurological:     Mental Status: She is alert and oriented to person, place, and time.  Psychiatric:        Mood and Affect: Mood normal.        Behavior: Behavior normal.        Thought Content: Thought content normal.        Judgment: Judgment normal.     BP (!) 153/78   Pulse 98   Resp 16   Wt 136 lb 9.6 oz (62 kg)   BMI 23.45 kg/m   Past Medical History:  Diagnosis Date   Acute pain of left foot 02/08/2020   Anxiety    Coronary artery disease    Depression    Dyspnea    Elevated liver enzymes 11/28/2020   High cholesterol     Hypertension    Peripheral vascular disease (HCC)     Social History   Socioeconomic History   Marital status: Married    Spouse name: Not on file   Number of children: 1   Years of education: Not on file   Highest education level: Not on file  Occupational History    Employer: [employer]  Tobacco Use   Smoking status: Every Day    Current packs/day: 0.50    Average packs/day: 0.5 packs/day for 36.0 years (18.0 ttl pk-yrs)    Types: Cigarettes   Smokeless tobacco: Never   Tobacco comments:    she plans to quit without medication  Vaping Use   Vaping status: Never Used  Substance and Sexual Activity   Alcohol use: Yes    Alcohol/week: 21.0 standard drinks of alcohol    Types: 21 Glasses of wine per week   Drug use: No   Sexual activity: Not Currently    Birth control/protection: None, Post-menopausal  Other Topics Concern   Not on file  Social History Narrative  Works for [employer], retired 06/2016   Married.   Daughter and granddaughter.          Social Drivers of Corporate investment banker Strain: Low Risk  (03/24/2023)   Overall Financial Resource Strain (CARDIA)    Difficulty of Paying Living Expenses: Not hard at all  Food Insecurity: No Food Insecurity (09/21/2023)   Hunger Vital Sign    Worried About Running Out of Food in the Last Year: Never true    Ran Out of Food in the Last Year: Never true  Transportation Needs: No Transportation Needs (09/21/2023)   PRAPARE - Administrator, Civil Service (Medical): No    Lack of Transportation (Non-Medical): No  Physical Activity: Insufficiently Active (03/24/2023)   Exercise Vital Sign    Days of Exercise per Week: 2 days    Minutes of Exercise per Session: 20 min  Stress: No Stress Concern Present (03/24/2023)   [employer] of Occupational Health - Occupational Stress Questionnaire    Feeling of Stress : Only a little  Social Connections: Moderately Integrated (03/24/2023)   Social  Connection and Isolation Panel [NHANES]    Frequency of Communication with Friends and Family: More than three times a week    Frequency of Social Gatherings with Friends and Family: Not on file    Attends Religious Services: More than 4 times per year    Active Member of [employer] or Organizations: No    Attends Banker Meetings: Never    Marital Status: Married  Catering manager Violence: Not At Risk (09/21/2023)   Humiliation, Afraid, Rape, and Kick questionnaire    Fear of Current or Ex-Partner: No    Emotionally Abused: No    Physically Abused: No    Sexually Abused: No    Past Surgical History:  Procedure Laterality Date   BUNIONECTOMY Bilateral    carpal tunnel repair     COLONOSCOPY WITH PROPOFOL N/A 08/19/2021   Procedure: COLONOSCOPY WITH PROPOFOL;  Surgeon: Toney Reil, MD;  Location: [facility];  Service: Gastroenterology;  Laterality: N/A;   DENTAL SURGERY     ENDARTERECTOMY FEMORAL Bilateral 09/16/2023   Procedure: ENDARTERECTOMY FEMORAL (BILATERAL SFA STENTS);  Surgeon: Renford Dills, MD;  Location: [facility];  Service: Vascular;  Laterality: Bilateral;   FOOT SURGERY Right    LOWER EXTREMITY ANGIOGRAPHY Right 08/18/2023   Procedure: Lower Extremity Angiography;  Surgeon: Renford Dills, MD;  Location: [facility];  Service: Cardiovascular;  Laterality: Right;   TUBAL LIGATION     VAGINAL DELIVERY     x1    Family History  Problem Relation Age of Onset   Hypertension Mother    Colon cancer Mother 67   Atrial fibrillation Mother    Dementia Mother    Heart disease Father    Diabetes Father    Colon cancer Father 24   Congestive Heart Failure Father    COPD Sister    Crohn's disease Sister    Colon cancer Sister 68   Heart attack Sister    Heart disease Brother    Breast cancer Neg Hx     Allergies  Allergen Reactions   Cat Dander    Pollen Extract Itching       Latest Ref Rng & Units 10/03/2023    4:23 PM  09/17/2023   12:01 AM 09/16/2023    3:19 PM  CBC  WBC 4.0 - 10.5 K/uL 12.8  9.8  9.0  Hemoglobin 12.0 - 15.0 g/dL 81.1  91.4  78.2   Hematocrit 36.0 - 46.0 % 37.3  32.7  33.9   Platelets 150 - 400 K/uL 526  178  197       CMP     Component Value Date/Time   NA 134 (L) 10/03/2023 1623   NA 140 04/19/2015 0000   K 4.0 10/03/2023 1623   CL 100 10/03/2023 1623   CO2 23 10/03/2023 1623   GLUCOSE 125 (H) 10/03/2023 1623   BUN 9 10/03/2023 1623   BUN 14 04/19/2015 0000   CREATININE 0.84 10/03/2023 1623   CALCIUM 8.7 (L) 10/03/2023 1623   PROT 7.2 10/03/2023 1623   PROT 7.1 02/24/2023 0919   ALBUMIN 3.7 10/03/2023 1623   ALBUMIN 4.4 02/24/2023 0919   AST 27 10/03/2023 1623   ALT 21 10/03/2023 1623   ALKPHOS 125 10/03/2023 1623   BILITOT 0.3 10/03/2023 1623   BILITOT 0.5 02/24/2023 0919   GFR 67.69 04/15/2023 1124   GFRNONAA >60 10/03/2023 1623     No results found.     Assessment & Plan:   1. 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  There is still some fibrinous exudate but is much smaller in size and no evidence of infection noted.  Will continue with Medihoney.  The patient also has home health now.  Will have her return in 2 weeks. Current Outpatient Medications on File Prior to Visit  Medication Sig Dispense Refill   acetaminophen (TYLENOL) 325 MG tablet Take 1-2 tablets (325-650 mg total) by mouth every 4 (four) hours as needed for mild pain (pain score 1-3) (or temp >/= 101 F).     amLODipine (NORVASC) 5 MG tablet Take 1 tablet (5 mg total) by mouth daily as needed. (Patient taking differently: Take 5 mg by mouth daily.) 90 tablet 3   aspirin 81 MG tablet Take 81 mg by mouth daily.     atorvastatin (LIPITOR) 40 MG tablet TAKE 1 TABLET BY MOUTH DAILY AT 6 PM. 90 tablet 3   CALCIUM PO Take 500 mg by mouth daily.     Cholecalciferol (VITAMIN D3  PO) Take 25 mcg by mouth daily.     clopidogrel (PLAVIX) 75 MG tablet Take 1 tablet (75 mg total) by mouth daily at 6 (six) AM. 30 tablet 3   Cyanocobalamin (B-12 PO) Take 500 mcg by mouth daily.     ezetimibe (ZETIA) 10 MG tablet Take 1 tablet (10 mg total) by mouth daily. 90 tablet 3   hydrOXYzine (ATARAX) 25 MG tablet Take 1 tablet (25 mg total) by mouth 3 (three) times daily as needed. 30 tablet 0   meclizine (ANTIVERT) 12.5 MG tablet Take 1 tablet (12.5 mg total) by mouth 3 (three) times daily as needed (vertigo). 30 tablet 0   Multiple Vitamins-Minerals (EQ MULTIVITAMINS ADULT GUMMY PO) Take 2 tablets by mouth daily.     oxyCODONE-acetaminophen (PERCOCET/ROXICET) 5-325 MG tablet Take 1-2 tablets by mouth every 4 (four) hours as needed for moderate pain (pain score 4-6). 30 tablet 0   oxyCODONE-acetaminophen (PERCOCET/ROXICET) 5-325 MG tablet Take 1-2 tablets by mouth every 4 (four) hours as needed for moderate pain 30 tablet 0   No current facility-administered medications on file prior to visit.    There are no Patient Instructions on file for this visit. No follow-ups on file.   Georgiana Spinner,  NP

## 2023-11-16 DIAGNOSIS — I739 Peripheral vascular disease, unspecified: Secondary | ICD-10-CM | POA: Diagnosis not present

## 2023-11-16 DIAGNOSIS — I10 Essential (primary) hypertension: Secondary | ICD-10-CM | POA: Diagnosis not present

## 2023-11-16 DIAGNOSIS — T8141XD Infection following a procedure, superficial incisional surgical site, subsequent encounter: Secondary | ICD-10-CM | POA: Diagnosis not present

## 2023-11-16 DIAGNOSIS — F32A Depression, unspecified: Secondary | ICD-10-CM | POA: Diagnosis not present

## 2023-11-16 DIAGNOSIS — E7800 Pure hypercholesterolemia, unspecified: Secondary | ICD-10-CM | POA: Diagnosis not present

## 2023-11-16 DIAGNOSIS — I2510 Atherosclerotic heart disease of native coronary artery without angina pectoris: Secondary | ICD-10-CM | POA: Diagnosis not present

## 2023-11-16 DIAGNOSIS — F411 Generalized anxiety disorder: Secondary | ICD-10-CM | POA: Diagnosis not present

## 2023-11-16 DIAGNOSIS — T8131XD Disruption of external operation (surgical) wound, not elsewhere classified, subsequent encounter: Secondary | ICD-10-CM | POA: Diagnosis not present

## 2023-11-23 ENCOUNTER — Encounter: Payer: Self-pay | Admitting: Family

## 2023-11-23 ENCOUNTER — Ambulatory Visit: Payer: No Typology Code available for payment source | Admitting: Family

## 2023-11-23 ENCOUNTER — Other Ambulatory Visit: Payer: Self-pay

## 2023-11-23 VITALS — BP 138/86 | HR 72 | Temp 98.1°F | Ht 63.0 in | Wt 132.6 lb

## 2023-11-23 DIAGNOSIS — R21 Rash and other nonspecific skin eruption: Secondary | ICD-10-CM | POA: Diagnosis not present

## 2023-11-23 DIAGNOSIS — I10 Essential (primary) hypertension: Secondary | ICD-10-CM

## 2023-11-23 MED ORDER — TRIAMCINOLONE ACETONIDE 0.5 % EX OINT
1.0000 | TOPICAL_OINTMENT | Freq: Two times a day (BID) | CUTANEOUS | 2 refills | Status: DC
  Filled 2023-11-23: qty 15, 8d supply, fill #0
  Filled 2023-11-28: qty 15, 8d supply, fill #1
  Filled 2024-01-08: qty 15, 8d supply, fill #2

## 2023-11-23 NOTE — Progress Notes (Signed)
Assessment & Plan:  Rash Assessment & Plan: Scaly, thickened skin left lower leg.  No evidence of infection.  Differential includes lymphedema like skin changes, atopic dermatitis, or related to PAD.  Advised to trial Kenalog with emollient for short course.  Counseled on hypopigmentation, skin thinning or long-term use topical steroid.  Consider dermatology consult  Orders: -     Triamcinolone Acetonide; Apply 1 Application topically 2 (two) times daily. Use for < 1 week or less.  Dispense: 15 g; Refill: 2  Primary hypertension Assessment & Plan: Blood pressure improved today from prior. Continue amlodipine 5mg  and consider dose escalation at follow-up.       Return precautions given.   Risks, benefits, and alternatives of the medications and treatment plan prescribed today were discussed, and patient expressed understanding.   Education regarding symptom management and diagnosis given to patient on AVS either electronically or printed.  No follow-ups on file.  Rennie Plowman, FNP  Subjective:    [name], female    DOB: 14-Jan-1951, 73 y.o.   MRN: [id number]  CC: Wanda Clayton is a 73 y.o. female who presents today for follow up.   HPI: Accompanied by husband today Bilateral surgical incisions are improving.  No purulent discharge, fever.  Pain has improved.  She is followed regularly with vascular. She has noted left lower extremity skin changes, dry skin. No leg swelling.    Follow-up vascular 11/13/2023 for wound dehiscence; continued on Medihoney status post angiogram with intervention on 09/16/2023   Allergies: Cat dander and Pollen extract Current Outpatient Medications on File Prior to Visit  Medication Sig Dispense Refill   acetaminophen (TYLENOL) 325 MG tablet Take 1-2 tablets (325-650 mg total) by mouth every 4 (four) hours as needed for mild pain (pain score 1-3) (or temp >/= 101 F).     amLODipine (NORVASC) 5 MG tablet Take 1 tablet (5 mg  total) by mouth daily as needed. (Patient taking differently: Take 5 mg by mouth daily.) 90 tablet 3   aspirin 81 MG tablet Take 81 mg by mouth daily.     atorvastatin (LIPITOR) 40 MG tablet TAKE 1 TABLET BY MOUTH DAILY AT 6 PM. 90 tablet 3   CALCIUM PO Take 500 mg by mouth daily.     Cholecalciferol (VITAMIN D3 PO) Take 25 mcg by mouth daily.     clopidogrel (PLAVIX) 75 MG tablet Take 1 tablet (75 mg total) by mouth daily at 6 (six) AM. 30 tablet 3   Cyanocobalamin (B-12 PO) Take 500 mcg by mouth daily.     ezetimibe (ZETIA) 10 MG tablet Take 1 tablet (10 mg total) by mouth daily. 90 tablet 3   hydrOXYzine (ATARAX) 25 MG tablet Take 1 tablet (25 mg total) by mouth 3 (three) times daily as needed. 30 tablet 0   meclizine (ANTIVERT) 12.5 MG tablet Take 1 tablet (12.5 mg total) by mouth 3 (three) times daily as needed (vertigo). 30 tablet 0   Multiple Vitamins-Minerals (EQ MULTIVITAMINS ADULT GUMMY PO) Take 2 tablets by mouth daily.     oxyCODONE-acetaminophen (PERCOCET/ROXICET) 5-325 MG tablet Take 1-2 tablets by mouth every 4 (four) hours as needed for moderate pain (pain score 4-6). 30 tablet 0   oxyCODONE-acetaminophen (PERCOCET/ROXICET) 5-325 MG tablet Take 1-2 tablets by mouth every 4 (four) hours as needed for moderate pain 30 tablet 0   No current facility-administered medications on file prior to visit.    Review of Systems  Constitutional:  Negative  for chills and fever.  Respiratory:  Negative for cough.   Cardiovascular:  Negative for chest pain, palpitations and leg swelling.  Gastrointestinal:  Negative for nausea and vomiting.  Skin:  Positive for color change.      Objective:    BP 138/86   Pulse 72   Temp 98.1 F (36.7 C) (Oral)   Ht 5\' 3"  (1.6 m)   Wt 132 lb 9.6 oz (60.1 kg)   SpO2 98%   BMI 23.49 kg/m  BP Readings from Last 3 Encounters:  11/23/23 138/86  11/13/23 (!) 153/78  10/27/23 (!) 140/75   Wt Readings from Last 3 Encounters:  11/23/23 132 lb 9.6 oz  (60.1 kg)  11/13/23 136 lb 9.6 oz (62 kg)  10/27/23 135 lb (61.2 kg)    Physical Exam Vitals reviewed.  Constitutional:      Appearance: She is well-developed.  Eyes:     Conjunctiva/sclera: Conjunctivae normal.  Cardiovascular:     Rate and Rhythm: Normal rate and regular rhythm.     Pulses: Normal pulses.     Heart sounds: Normal heart sounds.  Pulmonary:     Effort: Pulmonary effort is normal.     Breath sounds: Normal breath sounds. No wheezing, rhonchi or rales.  Skin:    General: Skin is warm and dry.          Comments: Left lower medial calf skin thickening, with scaly patch.  No erythema, increased heat. Surgical incision sites left upper leg well-approximated without purulent discharge.  Neurological:     Mental Status: She is alert.  Psychiatric:        Speech: Speech normal.        Behavior: Behavior normal.        Thought Content: Thought content normal.

## 2023-11-23 NOTE — Patient Instructions (Addendum)
 I sent in Kenalog  cream to be mixed in with an emollient such as Vaseline.  This would be treatment for dry skin, also  eczema.    I am more concerned however that skin changes you are seeing are more related to underlying peripheral artery disease.    Please let me know how you are doing

## 2023-11-24 ENCOUNTER — Other Ambulatory Visit: Payer: Self-pay

## 2023-11-25 ENCOUNTER — Telehealth (INDEPENDENT_AMBULATORY_CARE_PROVIDER_SITE_OTHER): Payer: Self-pay

## 2023-11-25 NOTE — Telephone Encounter (Signed)
Wanda Clayton called from home health asking for a verbal order for an extension of wound care orders until this week.

## 2023-11-25 NOTE — Assessment & Plan Note (Signed)
Blood pressure improved today from prior. Continue amlodipine 5mg  and consider dose escalation at follow-up.

## 2023-11-25 NOTE — Assessment & Plan Note (Addendum)
Scaly, thickened skin left lower leg.  No evidence of infection.  Differential includes lymphedema like skin changes, atopic dermatitis, or related to PAD.  Advised to trial Kenalog with emollient for short course.  Counseled on hypopigmentation, skin thinning or long-term use topical steroid.  Consider dermatology consult

## 2023-11-26 NOTE — Telephone Encounter (Signed)
Orders given to [facility]

## 2023-11-26 NOTE — Telephone Encounter (Signed)
That is fine

## 2023-11-28 ENCOUNTER — Other Ambulatory Visit: Payer: Self-pay

## 2023-11-30 ENCOUNTER — Encounter (INDEPENDENT_AMBULATORY_CARE_PROVIDER_SITE_OTHER): Payer: Self-pay | Admitting: Nurse Practitioner

## 2023-11-30 ENCOUNTER — Ambulatory Visit (INDEPENDENT_AMBULATORY_CARE_PROVIDER_SITE_OTHER): Payer: No Typology Code available for payment source | Admitting: Nurse Practitioner

## 2023-11-30 VITALS — BP 139/74 | HR 90 | Resp 18 | Ht 63.0 in | Wt 132.0 lb

## 2023-11-30 DIAGNOSIS — Z9889 Other specified postprocedural states: Secondary | ICD-10-CM

## 2023-11-30 DIAGNOSIS — I739 Peripheral vascular disease, unspecified: Secondary | ICD-10-CM

## 2023-12-01 NOTE — Progress Notes (Signed)
Subjective:    [name], female    DOB: 1951/08/11, 73 y.o.   MRN: [id number] Chief Complaint  Patient presents with   Follow-up    Encounter form: F/u 2 weeks no studies    The patient returns to the office for followup and review status post angiogram with intervention on 09/16/2023.    The patient's wound has improved quite nicely since her last visit.  There is still an open area in the groin but there is much less fibrinous exudate and there is no evidence of infection or foul-smelling odor.  The periwound area is also much improved.  The depth is decreased as well as the overall size.  The patient notes that she is feeling better she has some swelling in the left leg.  She is able to be up and a little bit more active than previous.    Review of Systems  Cardiovascular:  Positive for leg swelling.  Skin:  Positive for wound.  All other systems reviewed and are negative.      Objective:   Physical Exam Vitals reviewed.  HENT:     Head: Normocephalic.  Cardiovascular:     Rate and Rhythm: Normal rate.     Pulses:          Dorsalis pedis pulses are 1+ on the right side and 2+ on the left side.  Pulmonary:     Effort: Pulmonary effort is normal.  Skin:    General: Skin is warm and dry.  Neurological:     Mental Status: She is alert and oriented to person, place, and time.  Psychiatric:        Mood and Affect: Mood normal.        Behavior: Behavior normal.        Thought Content: Thought content normal.        Judgment: Judgment normal.     BP 139/74   Pulse 90   Resp 18   Ht 5\' 3"  (1.6 m)   Wt 132 lb (59.9 kg)   BMI 23.38 kg/m   Past Medical History:  Diagnosis Date   Acute pain of left foot 02/08/2020   Anxiety    Coronary artery disease    Depression    Dyspnea    Elevated liver enzymes 11/28/2020   High cholesterol    Hypertension    Peripheral vascular disease (HCC)     Social History   Socioeconomic History   Marital  status: Married    Spouse name: Not on file   Number of children: 1   Years of education: Not on file   Highest education level: Not on file  Occupational History    Employer: [employer]  Tobacco Use   Smoking status: Every Day    Current packs/day: 0.50    Average packs/day: 0.5 packs/day for 36.0 years (18.0 ttl pk-yrs)    Types: Cigarettes   Smokeless tobacco: Never   Tobacco comments:    she plans to quit without medication  Vaping Use   Vaping status: Never Used  Substance and Sexual Activity   Alcohol use: Yes    Alcohol/week: 21.0 standard drinks of alcohol    Types: 21 Glasses of wine per week   Drug use: No   Sexual activity: Not Currently    Birth control/protection: None, Post-menopausal  Other Topics Concern   Not on file  Social History Narrative   Works for [employer], retired 06/2016  Married.   Daughter and granddaughter.          Social Drivers of Corporate investment banker Strain: Low Risk  (03/24/2023)   Overall Financial Resource Strain (CARDIA)    Difficulty of Paying Living Expenses: Not hard at all  Food Insecurity: No Food Insecurity (09/21/2023)   Hunger Vital Sign    Worried About Running Out of Food in the Last Year: Never true    Ran Out of Food in the Last Year: Never true  Transportation Needs: No Transportation Needs (09/21/2023)   PRAPARE - Administrator, Civil Service (Medical): No    Lack of Transportation (Non-Medical): No  Physical Activity: Insufficiently Active (03/24/2023)   Exercise Vital Sign    Days of Exercise per Week: 2 days    Minutes of Exercise per Session: 20 min  Stress: No Stress Concern Present (03/24/2023)   [employer] of Occupational Health - Occupational Stress Questionnaire    Feeling of Stress : Only a little  Social Connections: Moderately Integrated (03/24/2023)   Social Connection and Isolation Panel [NHANES]    Frequency of Communication with Friends and Family: More than three  times a week    Frequency of Social Gatherings with Friends and Family: Not on file    Attends Religious Services: More than 4 times per year    Active Member of [employer] or Organizations: No    Attends Banker Meetings: Never    Marital Status: Married  Catering manager Violence: Not At Risk (09/21/2023)   Humiliation, Afraid, Rape, and Kick questionnaire    Fear of Current or Ex-Partner: No    Emotionally Abused: No    Physically Abused: No    Sexually Abused: No    Past Surgical History:  Procedure Laterality Date   BUNIONECTOMY Bilateral    carpal tunnel repair     COLONOSCOPY WITH PROPOFOL N/A 08/19/2021   Procedure: COLONOSCOPY WITH PROPOFOL;  Surgeon: Toney Reil, MD;  Location: [facility];  Service: Gastroenterology;  Laterality: N/A;   DENTAL SURGERY     ENDARTERECTOMY FEMORAL Bilateral 09/16/2023   Procedure: ENDARTERECTOMY FEMORAL (BILATERAL SFA STENTS);  Surgeon: Renford Dills, MD;  Location: [facility] ORS;  Service: Vascular;  Laterality: Bilateral;   FOOT SURGERY Right    LOWER EXTREMITY ANGIOGRAPHY Right 08/18/2023   Procedure: Lower Extremity Angiography;  Surgeon: Renford Dills, MD;  Location: [facility];  Service: Cardiovascular;  Laterality: Right;   TUBAL LIGATION     VAGINAL DELIVERY     x1    Family History  Problem Relation Age of Onset   Hypertension Mother    Colon cancer Mother 44   Atrial fibrillation Mother    Dementia Mother    Heart disease Father    Diabetes Father    Colon cancer Father 71   Congestive Heart Failure Father    COPD Sister    Crohn's disease Sister    Colon cancer Sister 59   Heart attack Sister    Heart disease Brother    Breast cancer Neg Hx     Allergies  Allergen Reactions   Cat Dander    Pollen Extract Itching       Latest Ref Rng & Units 10/03/2023    4:23 PM 09/17/2023   12:01 AM 09/16/2023    3:19 PM  CBC  WBC 4.0 - 10.5 K/uL 12.8  9.8  9.0   Hemoglobin 12.0 - 15.0 g/dL  16.1  09.6  11.5   Hematocrit 36.0 - 46.0 % 37.3  32.7  33.9   Platelets 150 - 400 K/uL 526  178  197       CMP     Component Value Date/Time   NA 134 (L) 10/03/2023 1623   NA 140 04/19/2015 0000   K 4.0 10/03/2023 1623   CL 100 10/03/2023 1623   CO2 23 10/03/2023 1623   GLUCOSE 125 (H) 10/03/2023 1623   BUN 9 10/03/2023 1623   BUN 14 04/19/2015 0000   CREATININE 0.84 10/03/2023 1623   CALCIUM 8.7 (L) 10/03/2023 1623   PROT 7.2 10/03/2023 1623   PROT 7.1 02/24/2023 0919   ALBUMIN 3.7 10/03/2023 1623   ALBUMIN 4.4 02/24/2023 0919   AST 27 10/03/2023 1623   ALT 21 10/03/2023 1623   ALKPHOS 125 10/03/2023 1623   BILITOT 0.3 10/03/2023 1623   BILITOT 0.5 02/24/2023 0919   GFR 67.69 04/15/2023 1124   GFRNONAA >60 10/03/2023 1623     No results found.     Assessment & Plan:   1. 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  There is still some fibrinous exudate but is much smaller in size and no evidence of infection noted.  Will continue with Medihoney.  Her swelling is also improved.  With increased improvement in wound she is also recommended to try to engage in increasing her activity as well.  Will have her return in 2 weeks. Current Outpatient Medications on File Prior to Visit  Medication Sig Dispense Refill   acetaminophen (TYLENOL) 325 MG tablet Take 1-2 tablets (325-650 mg total) by mouth every 4 (four) hours as needed for mild pain (pain score 1-3) (or temp >/= 101 F).     amLODipine (NORVASC) 5 MG tablet Take 1 tablet (5 mg total) by mouth daily as needed. (Patient taking differently: Take 5 mg by mouth daily.) 90 tablet 3   aspirin 81 MG tablet Take 81 mg by mouth daily.     atorvastatin (LIPITOR) 40 MG tablet Take 1 tablet (40 mg total) by mouth daily at 6 PM. 90 tablet 3   CALCIUM PO Take 500 mg by mouth daily.     Cholecalciferol (VITAMIN  D3 PO) Take 25 mcg by mouth daily.     clopidogrel (PLAVIX) 75 MG tablet Take 1 tablet (75 mg total) by mouth daily at 6 (six) AM. 30 tablet 3   Cyanocobalamin (B-12 PO) Take 500 mcg by mouth daily.     ezetimibe (ZETIA) 10 MG tablet Take 1 tablet (10 mg total) by mouth daily. 90 tablet 3   hydrOXYzine (ATARAX) 25 MG tablet Take 1 tablet (25 mg total) by mouth 3 (three) times daily as needed. 30 tablet 0   meclizine (ANTIVERT) 12.5 MG tablet Take 1 tablet (12.5 mg total) by mouth 3 (three) times daily as needed (vertigo). 30 tablet 0   Multiple Vitamins-Minerals (EQ MULTIVITAMINS ADULT GUMMY PO) Take 2 tablets by mouth daily.     oxyCODONE-acetaminophen (PERCOCET/ROXICET) 5-325 MG tablet Take 1-2 tablets by mouth every 4 (four) hours as needed for moderate pain (pain score 4-6). 30 tablet 0   oxyCODONE-acetaminophen (PERCOCET/ROXICET) 5-325 MG tablet Take 1-2 tablets by mouth every 4 (four) hours as needed for moderate pain 30 tablet 0   triamcinolone ointment (KENALOG) 0.5 % Apply 1 Application topically 2 (two) times daily. Use for < 1 week or  less. 15 g 2   No current facility-administered medications on file prior to visit.    There are no Patient Instructions on file for this visit. No follow-ups on file.   Georgiana Spinner, NP

## 2023-12-04 ENCOUNTER — Other Ambulatory Visit: Payer: Self-pay

## 2023-12-15 ENCOUNTER — Ambulatory Visit
Admission: RE | Admit: 2023-12-15 | Discharge: 2023-12-15 | Disposition: A | Discharge status: Home / Self Care | Payer: No Typology Code available for payment source | Source: Ambulatory Visit | Attending: Family | Admitting: Family

## 2023-12-15 DIAGNOSIS — Z1231 Encounter for screening mammogram for malignant neoplasm of breast: Secondary | ICD-10-CM | POA: Diagnosis not present

## 2023-12-17 ENCOUNTER — Ambulatory Visit (INDEPENDENT_AMBULATORY_CARE_PROVIDER_SITE_OTHER): Payer: No Typology Code available for payment source | Admitting: Nurse Practitioner

## 2023-12-22 ENCOUNTER — Ambulatory Visit (INDEPENDENT_AMBULATORY_CARE_PROVIDER_SITE_OTHER): Payer: No Typology Code available for payment source | Admitting: Nurse Practitioner

## 2023-12-22 ENCOUNTER — Encounter (INDEPENDENT_AMBULATORY_CARE_PROVIDER_SITE_OTHER): Payer: Self-pay | Admitting: Nurse Practitioner

## 2023-12-22 VITALS — BP 138/73 | HR 99 | Resp 18 | Ht 63.0 in | Wt 136.4 lb

## 2023-12-22 DIAGNOSIS — I70223 Atherosclerosis of native arteries of extremities with rest pain, bilateral legs: Secondary | ICD-10-CM

## 2023-12-22 DIAGNOSIS — H25813 Combined forms of age-related cataract, bilateral: Secondary | ICD-10-CM | POA: Diagnosis not present

## 2023-12-22 NOTE — Progress Notes (Unsigned)
 Subjective:    [name], female    DOB: 03/31/1951, 73 y.o.   MRN: [id number] Chief Complaint  Patient presents with  . Follow-up    2 weeks no studies    The patient returns to the office for followup and review status post angiogram with intervention on 09/16/2023.    The patient's wound has improved quite nicely since her last visit.  There is still an open area in the groin but there is much less fibrinous exudate and there is no evidence of infection or foul-smelling odor.  The periwound area is also much improved.  The depth is decreased as well as the overall size.  The patient notes that she is feeling better she has some swelling in the left leg.  She is able to be up and a little bit more active than previous.   Review of Systems  Cardiovascular:  Positive for leg swelling.  Skin:  Positive for wound.  All other systems reviewed and are negative.      Objective:   Physical Exam Vitals reviewed.  HENT:     Head: Normocephalic.  Cardiovascular:     Rate and Rhythm: Normal rate.     Pulses:          Dorsalis pedis pulses are 1+ on the right side and 2+ on the left side.  Pulmonary:     Effort: Pulmonary effort is normal.  Skin:    General: Skin is warm and dry.  Neurological:     Mental Status: She is alert and oriented to person, place, and time.  Psychiatric:        Mood and Affect: Mood normal.        Behavior: Behavior normal.        Thought Content: Thought content normal.        Judgment: Judgment normal.    BP 138/73   Pulse 99   Resp 18   Ht 5\' 3"  (1.6 m)   Wt 136 lb 6.4 oz (61.9 kg)   BMI 24.16 kg/m   Past Medical History:  Diagnosis Date  . Acute pain of left foot 02/08/2020  . Anxiety   . Coronary artery disease   . Depression   . Dyspnea   . Elevated liver enzymes 11/28/2020  . High cholesterol   . Hypertension   . Peripheral vascular disease (HCC)     Social History   Socioeconomic History  . Marital status:  Married    Spouse name: Not on file  . Number of children: 1  . Years of education: Not on file  . Highest education level: Not on file  Occupational History    Employer: [employer]  Tobacco Use  . Smoking status: Every Day    Current packs/day: 0.50    Average packs/day: 0.5 packs/day for 36.0 years (18.0 ttl pk-yrs)    Types: Cigarettes  . Smokeless tobacco: Never  . Tobacco comments:    she plans to quit without medication  Vaping Use  . Vaping status: Never Used  Substance and Sexual Activity  . Alcohol use: Yes    Alcohol/week: 21.0 standard drinks of alcohol    Types: 21 Glasses of wine per week  . Drug use: No  . Sexual activity: Not Currently    Birth control/protection: None, Post-menopausal  Other Topics Concern  . Not on file  Social History Narrative   Works for [employer], retired 06/2016   Married.  Daughter and granddaughter.          Social Drivers of Health   Financial Resource Strain: Low Risk  (03/24/2023)   Overall Financial Resource Strain (CARDIA)   . Difficulty of Paying Living Expenses: Not hard at all  Food Insecurity: No Food Insecurity (09/21/2023)   Hunger Vital Sign   . Worried About Programme researcher, broadcasting/film/video in the Last Year: Never true   . Ran Out of Food in the Last Year: Never true  Transportation Needs: No Transportation Needs (09/21/2023)   PRAPARE - Transportation   . Lack of Transportation (Medical): No   . Lack of Transportation (Non-Medical): No  Physical Activity: Insufficiently Active (03/24/2023)   Exercise Vital Sign   . Days of Exercise per Week: 2 days   . Minutes of Exercise per Session: 20 min  Stress: No Stress Concern Present (03/24/2023)   [employer] of Occupational Health - Occupational Stress Questionnaire   . Feeling of Stress : Only a little  Social Connections: Moderately Integrated (03/24/2023)   Social Connection and Isolation Panel [NHANES]   . Frequency of Communication with Friends and Family: More  than three times a week   . Frequency of Social Gatherings with Friends and Family: Not on file   . Attends Religious Services: More than 4 times per year   . Active Member of Clubs or Organizations: No   . Attends Banker Meetings: Never   . Marital Status: Married  Catering manager Violence: Not At Risk (09/21/2023)   Humiliation, Afraid, Rape, and Kick questionnaire   . Fear of Current or Ex-Partner: No   . Emotionally Abused: No   . Physically Abused: No   . Sexually Abused: No    Past Surgical History:  Procedure Laterality Date  . BUNIONECTOMY Bilateral   . carpal tunnel repair    . COLONOSCOPY WITH PROPOFOL N/A 08/19/2021   Procedure: COLONOSCOPY WITH PROPOFOL;  Surgeon: Toney Reil, MD;  Location: [facility] ENDOSCOPY;  Service: Gastroenterology;  Laterality: N/A;  . DENTAL SURGERY    . ENDARTERECTOMY FEMORAL Bilateral 09/16/2023   Procedure: ENDARTERECTOMY FEMORAL (BILATERAL SFA STENTS);  Surgeon: Renford Dills, MD;  Location: [facility] ORS;  Service: Vascular;  Laterality: Bilateral;  . FOOT SURGERY Right   . LOWER EXTREMITY ANGIOGRAPHY Right 08/18/2023   Procedure: Lower Extremity Angiography;  Surgeon: Renford Dills, MD;  Location: [facility] INVASIVE CV LAB;  Service: Cardiovascular;  Laterality: Right;  . TUBAL LIGATION    . VAGINAL DELIVERY     x1    Family History  Problem Relation Age of Onset  . Hypertension Mother   . Colon cancer Mother 20  . Atrial fibrillation Mother   . Dementia Mother   . Heart disease Father   . Diabetes Father   . Colon cancer Father 91  . Congestive Heart Failure Father   . COPD Sister   . Crohn's disease Sister   . Colon cancer Sister 81  . Breast cancer Sister   . Heart attack Sister   . Heart disease Brother     Allergies  Allergen Reactions  . Cat Dander   . Pollen Extract Itching       Latest Ref Rng & Units 10/03/2023    4:23 PM 09/17/2023   12:01 AM 09/16/2023    3:19 PM  CBC  WBC 4.0 - 10.5 K/uL  12.8  9.8  9.0   Hemoglobin 12.0 - 15.0 g/dL 14.7  82.9  11.5  Hematocrit 36.0 - 46.0 % 37.3  32.7  33.9   Platelets 150 - 400 K/uL 526  178  197       CMP     Component Value Date/Time   NA 134 (L) 10/03/2023 1623   NA 140 04/19/2015 0000   K 4.0 10/03/2023 1623   CL 100 10/03/2023 1623   CO2 23 10/03/2023 1623   GLUCOSE 125 (H) 10/03/2023 1623   BUN 9 10/03/2023 1623   BUN 14 04/19/2015 0000   CREATININE 0.84 10/03/2023 1623   CALCIUM 8.7 (L) 10/03/2023 1623   PROT 7.2 10/03/2023 1623   PROT 7.1 02/24/2023 0919   ALBUMIN 3.7 10/03/2023 1623   ALBUMIN 4.4 02/24/2023 0919   AST 27 10/03/2023 1623   ALT 21 10/03/2023 1623   ALKPHOS 125 10/03/2023 1623   BILITOT 0.3 10/03/2023 1623   BILITOT 0.5 02/24/2023 0919   GFR 67.69 04/15/2023 1124   GFRNONAA >60 10/03/2023 1623     No results found.     Assessment & Plan:   1. 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  There is still some fibrinous exudate but is much smaller in size and no evidence of infection noted.  Will continue with Medihoney.  Her swelling is also improved.  With increased improvement in wound she is also recommended to try to engage in increasing her activity as well.  Will have her return in 2 weeks. Current Outpatient Medications on File Prior to Visit  Medication Sig Dispense Refill  . acetaminophen (TYLENOL) 325 MG tablet Take 1-2 tablets (325-650 mg total) by mouth every 4 (four) hours as needed for mild pain (pain score 1-3) (or temp >/= 101 F).    Marland Kitchen amLODipine (NORVASC) 5 MG tablet Take 1 tablet (5 mg total) by mouth daily as needed. (Patient taking differently: Take 5 mg by mouth daily.) 90 tablet 3  . aspirin 81 MG tablet Take 81 mg by mouth daily.    Marland Kitchen atorvastatin (LIPITOR) 40 MG tablet Take 1 tablet (40 mg total) by mouth daily at 6 PM. 90 tablet 3  . CALCIUM PO Take  500 mg by mouth daily.    . Cholecalciferol (VITAMIN D3 PO) Take 25 mcg by mouth daily.    . clopidogrel (PLAVIX) 75 MG tablet Take 1 tablet (75 mg total) by mouth daily at 6 (six) AM. 30 tablet 3  . Cyanocobalamin (B-12 PO) Take 500 mcg by mouth daily.    Marland Kitchen ezetimibe (ZETIA) 10 MG tablet Take 1 tablet (10 mg total) by mouth daily. 90 tablet 3  . hydrOXYzine (ATARAX) 25 MG tablet Take 1 tablet (25 mg total) by mouth 3 (three) times daily as needed. 30 tablet 0  . meclizine (ANTIVERT) 12.5 MG tablet Take 1 tablet (12.5 mg total) by mouth 3 (three) times daily as needed (vertigo). 30 tablet 0  . Multiple Vitamins-Minerals (EQ MULTIVITAMINS ADULT GUMMY PO) Take 2 tablets by mouth daily.    Marland Kitchen oxyCODONE-acetaminophen (PERCOCET/ROXICET) 5-325 MG tablet Take 1-2 tablets by mouth every 4 (four) hours as needed for moderate pain (pain score 4-6). 30 tablet 0  . oxyCODONE-acetaminophen (PERCOCET/ROXICET) 5-325 MG tablet Take 1-2 tablets by mouth every 4 (four) hours as needed for moderate pain 30 tablet 0  . triamcinolone ointment (KENALOG) 0.5 % Apply 1 Application topically 2 (two) times daily. Use for < 1 week or less. 15 g  2   No current facility-administered medications on file prior to visit.    There are no Patient Instructions on file for this visit. No follow-ups on file.   Georgiana Spinner, NP

## 2023-12-28 ENCOUNTER — Other Ambulatory Visit: Payer: Self-pay

## 2024-01-08 ENCOUNTER — Encounter (INDEPENDENT_AMBULATORY_CARE_PROVIDER_SITE_OTHER): Payer: Self-pay | Admitting: Nurse Practitioner

## 2024-01-08 ENCOUNTER — Other Ambulatory Visit: Payer: Self-pay

## 2024-01-08 ENCOUNTER — Other Ambulatory Visit (INDEPENDENT_AMBULATORY_CARE_PROVIDER_SITE_OTHER): Payer: Self-pay | Admitting: Specialist

## 2024-01-08 ENCOUNTER — Ambulatory Visit (INDEPENDENT_AMBULATORY_CARE_PROVIDER_SITE_OTHER): Admitting: Nurse Practitioner

## 2024-01-08 VITALS — BP 145/77 | HR 105 | Resp 16 | Wt 135.6 lb

## 2024-01-08 DIAGNOSIS — T8130XA Disruption of wound, unspecified, initial encounter: Secondary | ICD-10-CM

## 2024-01-08 MED ORDER — CLOPIDOGREL BISULFATE 75 MG PO TABS
75.0000 mg | ORAL_TABLET | Freq: Every day | ORAL | 5 refills | Status: DC
  Filled 2024-01-08: qty 30, 30d supply, fill #0
  Filled 2024-02-08: qty 30, 30d supply, fill #1
  Filled 2024-03-09: qty 30, 30d supply, fill #2
  Filled 2024-04-06: qty 30, 30d supply, fill #3

## 2024-01-08 NOTE — Progress Notes (Signed)
 Subjective:    [name], female    DOB: [birth year], 73 y.o.   MRN: [id number] Chief Complaint  Patient presents with   Follow-up    3 week follow up    The patient returns to the office for followup and review status post angiogram with intervention on 09/16/2023.      The patient's wound has improved quite nicely since her last visit.  The proximal groin wound has healed.  The smaller wound that was distal has had the scab come off with there is no tunneling of the wound itself.  It apears to have decreased in size as well. In addition her swelling is better and she is continuing to be active.       Review of Systems  Skin:  Positive for wound.  All other systems reviewed and are negative.      Objective:   Physical Exam Vitals reviewed.  HENT:     Head: Normocephalic.  Cardiovascular:     Rate and Rhythm: Normal rate.  Pulmonary:     Effort: Pulmonary effort is normal.  Skin:    General: Skin is warm and dry.  Neurological:     Mental Status: She is alert and oriented to person, place, and time.  Psychiatric:        Mood and Affect: Mood normal.        Behavior: Behavior normal.        Thought Content: Thought content normal.        Judgment: Judgment normal.     BP (!) 145/77   Pulse (!) 105   Resp 16   Wt 135 lb 9.6 oz (61.5 kg)   BMI 24.02 kg/m   Past Medical History:  Diagnosis Date   Acute pain of left foot 02/08/2020   Anxiety    Coronary artery disease    Depression    Dyspnea    Elevated liver enzymes 11/28/2020   High cholesterol    Hypertension    Peripheral vascular disease (HCC)     Social History   Socioeconomic History   Marital status: Married    Spouse name: Not on file   Number of children: 1   Years of education: Not on file   Highest education level: Not on file  Occupational History    Employer: [employer]  Tobacco Use   Smoking status: Every Day    Current packs/day: 0.50    Average  packs/day: 0.5 packs/day for 36.0 years (18.0 ttl pk-yrs)    Types: Cigarettes   Smokeless tobacco: Never   Tobacco comments:    she plans to quit without medication  Vaping Use   Vaping status: Never Used  Substance and Sexual Activity   Alcohol use: Yes    Alcohol/week: 21.0 standard drinks of alcohol    Types: 21 Glasses of wine per week   Drug use: No   Sexual activity: Not Currently    Birth control/protection: None, Post-menopausal  Other Topics Concern   Not on file  Social History Narrative   Works for [employer], retired 06/2016   Married.   Daughter and granddaughter.          Social Drivers of Health   Financial Resource Strain: Low Risk  (03/24/2023)   Overall Financial Resource Strain (CARDIA)    Difficulty of Paying Living Expenses: Not hard at all  Food Insecurity: No Food Insecurity (09/21/2023)   Hunger Vital Sign    Worried  About Running Out of Food in the Last Year: Never true    Ran Out of Food in the Last Year: Never true  Transportation Needs: No Transportation Needs (09/21/2023)   PRAPARE - Administrator, Civil Service (Medical): No    Lack of Transportation (Non-Medical): No  Physical Activity: Insufficiently Active (03/24/2023)   Exercise Vital Sign    Days of Exercise per Week: 2 days    Minutes of Exercise per Session: 20 min  Stress: No Stress Concern Present (03/24/2023)   [employer] of Occupational Health - Occupational Stress Questionnaire    Feeling of Stress : Only a little  Social Connections: Moderately Integrated (03/24/2023)   Social Connection and Isolation Panel [NHANES]    Frequency of Communication with Friends and Family: More than three times a week    Frequency of Social Gatherings with Friends and Family: Not on file    Attends Religious Services: More than 4 times per year    Active Member of [employer] or Organizations: No    Attends Banker Meetings: Never    Marital Status: Married  Careers information officer Violence: Not At Risk (09/21/2023)   Humiliation, Afraid, Rape, and Kick questionnaire    Fear of Current or Ex-Partner: No    Emotionally Abused: No    Physically Abused: No    Sexually Abused: No    Past Surgical History:  Procedure Laterality Date   BUNIONECTOMY Bilateral    carpal tunnel repair     COLONOSCOPY WITH PROPOFOL N/A 08/19/2021   Procedure: COLONOSCOPY WITH PROPOFOL;  Surgeon: Toney Reil, MD;  Location: [facility] ENDOSCOPY;  Service: Gastroenterology;  Laterality: N/A;   DENTAL SURGERY     ENDARTERECTOMY FEMORAL Bilateral 09/16/2023   Procedure: ENDARTERECTOMY FEMORAL (BILATERAL SFA STENTS);  Surgeon: Renford Dills, MD;  Location: [facility] ORS;  Service: Vascular;  Laterality: Bilateral;   FOOT SURGERY Right    LOWER EXTREMITY ANGIOGRAPHY Right 08/18/2023   Procedure: Lower Extremity Angiography;  Surgeon: Renford Dills, MD;  Location: [facility] INVASIVE CV LAB;  Service: Cardiovascular;  Laterality: Right;   TUBAL LIGATION     VAGINAL DELIVERY     x1    Family History  Problem Relation Age of Onset   Hypertension Mother    Colon cancer Mother 38   Atrial fibrillation Mother    Dementia Mother    Heart disease Father    Diabetes Father    Colon cancer Father 38   Congestive Heart Failure Father    COPD Sister    Crohn's disease Sister    Colon cancer Sister 72   Breast cancer Sister    Heart attack Sister    Heart disease Brother     Allergies  Allergen Reactions   Cat Dander    Pollen Extract Itching       Latest Ref Rng & Units 10/03/2023    4:23 PM 09/17/2023   12:01 AM 09/16/2023    3:19 PM  CBC  WBC 4.0 - 10.5 K/uL 12.8  9.8  9.0   Hemoglobin 12.0 - 15.0 g/dL 16.1  09.6  04.5   Hematocrit 36.0 - 46.0 % 37.3  32.7  33.9   Platelets 150 - 400 K/uL 526  178  197       CMP     Component Value Date/Time   NA 134 (L) 10/03/2023 1623   NA 140 04/19/2015 0000   K 4.0 10/03/2023 1623   CL 100 10/03/2023 1623  CO2 23 10/03/2023  1623   GLUCOSE 125 (H) 10/03/2023 1623   BUN 9 10/03/2023 1623   BUN 14 04/19/2015 0000   CREATININE 0.84 10/03/2023 1623   CALCIUM 8.7 (L) 10/03/2023 1623   PROT 7.2 10/03/2023 1623   PROT 7.1 02/24/2023 0919   ALBUMIN 3.7 10/03/2023 1623   ALBUMIN 4.4 02/24/2023 0919   AST 27 10/03/2023 1623   ALT 21 10/03/2023 1623   ALKPHOS 125 10/03/2023 1623   BILITOT 0.3 10/03/2023 1623   BILITOT 0.5 02/24/2023 0919   GFR 67.69 04/15/2023 1124   GFRNONAA >60 10/03/2023 1623     No results found.     Assessment & Plan:   1. Wound dehiscence (Primary) Patient the patient's wound is much improved compared to previous evaluation.  The proximal wound is essential healed and the distal wound has also become smaller  Patient will continue with current wound dressings and she will follow-up in 3 weeks for evaluation    Current Outpatient Medications on File Prior to Visit  Medication Sig Dispense Refill   acetaminophen (TYLENOL) 325 MG tablet Take 1-2 tablets (325-650 mg total) by mouth every 4 (four) hours as needed for mild pain (pain score 1-3) (or temp >/= 101 F).     amLODipine (NORVASC) 5 MG tablet Take 1 tablet (5 mg total) by mouth daily as needed. (Patient taking differently: Take 5 mg by mouth daily.) 90 tablet 3   aspirin 81 MG tablet Take 81 mg by mouth daily.     atorvastatin (LIPITOR) 40 MG tablet Take 1 tablet (40 mg total) by mouth daily at 6 PM. 90 tablet 3   CALCIUM PO Take 500 mg by mouth daily.     Cholecalciferol (VITAMIN D3 PO) Take 25 mcg by mouth daily.     clopidogrel (PLAVIX) 75 MG tablet Take 1 tablet (75 mg total) by mouth daily at 6 (six) AM. 30 tablet 3   Cyanocobalamin (B-12 PO) Take 500 mcg by mouth daily.     ezetimibe (ZETIA) 10 MG tablet Take 1 tablet (10 mg total) by mouth daily. 90 tablet 3   hydrOXYzine (ATARAX) 25 MG tablet Take 1 tablet (25 mg total) by mouth 3 (three) times daily as needed. 30 tablet 0   meclizine (ANTIVERT) 12.5 MG tablet Take 1 tablet  (12.5 mg total) by mouth 3 (three) times daily as needed (vertigo). 30 tablet 0   Multiple Vitamins-Minerals (EQ MULTIVITAMINS ADULT GUMMY PO) Take 2 tablets by mouth daily.     oxyCODONE-acetaminophen (PERCOCET/ROXICET) 5-325 MG tablet Take 1-2 tablets by mouth every 4 (four) hours as needed for moderate pain (pain score 4-6). 30 tablet 0   oxyCODONE-acetaminophen (PERCOCET/ROXICET) 5-325 MG tablet Take 1-2 tablets by mouth every 4 (four) hours as needed for moderate pain 30 tablet 0   triamcinolone ointment (KENALOG) 0.5 % Apply 1 Application topically 2 (two) times daily. Use for < 1 week or less. 15 g 2   No current facility-administered medications on file prior to visit.    There are no Patient Instructions on file for this visit. No follow-ups on file.   Georgiana Spinner, NP

## 2024-01-11 ENCOUNTER — Other Ambulatory Visit: Payer: Self-pay

## 2024-02-02 ENCOUNTER — Ambulatory Visit (INDEPENDENT_AMBULATORY_CARE_PROVIDER_SITE_OTHER): Admitting: Nurse Practitioner

## 2024-02-02 ENCOUNTER — Encounter (INDEPENDENT_AMBULATORY_CARE_PROVIDER_SITE_OTHER): Payer: Self-pay | Admitting: Nurse Practitioner

## 2024-02-02 VITALS — BP 171/81 | HR 90 | Resp 16 | Wt 135.6 lb

## 2024-02-02 DIAGNOSIS — T8130XA Disruption of wound, unspecified, initial encounter: Secondary | ICD-10-CM

## 2024-02-02 NOTE — Progress Notes (Signed)
 Subjective:    [name], female    DOB: 15-Oct-1950, 73 y.o.   MRN: [id number] Chief Complaint  Patient presents with   Follow-up    3 week follow up    The patient returns to the office for followup and review status post angiogram with intervention on 09/16/2023.      The patient's wound has improved quite nicely since her last visit.  The proximal groin wound has healed.  The smaller wound that was distal has had the scab come off with there is no tunneling of the wound itself.  It apears to have decreased in size as well. In addition her swelling is better and she is continuing to be active.       Review of Systems  Skin:  Positive for wound.  All other systems reviewed and are negative.      Objective:   Physical Exam Vitals reviewed.  HENT:     Head: Normocephalic.  Cardiovascular:     Rate and Rhythm: Normal rate.  Pulmonary:     Effort: Pulmonary effort is normal.  Skin:    General: Skin is warm and dry.  Neurological:     Mental Status: She is alert and oriented to person, place, and time.  Psychiatric:        Mood and Affect: Mood normal.        Behavior: Behavior normal.        Thought Content: Thought content normal.        Judgment: Judgment normal.     BP (!) 171/81   Pulse 90   Resp 16   Wt 135 lb 9.6 oz (61.5 kg)   BMI 24.02 kg/m   Past Medical History:  Diagnosis Date   Acute pain of left foot 02/08/2020   Anxiety    Coronary artery disease    Depression    Dyspnea    Elevated liver enzymes 11/28/2020   High cholesterol    Hypertension    Peripheral vascular disease (HCC)     Social History   Socioeconomic History   Marital status: Married    Spouse name: Not on file   Number of children: 1   Years of education: Not on file   Highest education level: Not on file  Occupational History    Employer: [employer]  Tobacco Use   Smoking status: Every Day    Current packs/day: 0.50    Average packs/day: 0.5  packs/day for 36.0 years (18.0 ttl pk-yrs)    Types: Cigarettes   Smokeless tobacco: Never   Tobacco comments:    she plans to quit without medication  Vaping Use   Vaping status: Never Used  Substance and Sexual Activity   Alcohol use: Yes    Alcohol/week: 21.0 standard drinks of alcohol    Types: 21 Glasses of wine per week   Drug use: No   Sexual activity: Not Currently    Birth control/protection: None, Post-menopausal  Other Topics Concern   Not on file  Social History Narrative   Works for [employer], retired 06/2016   Married.   Daughter and granddaughter.          Social Drivers of Corporate investment banker Strain: Low Risk  (03/24/2023)   Overall Financial Resource Strain (CARDIA)    Difficulty of Paying Living Expenses: Not hard at all  Food Insecurity: No Food Insecurity (09/21/2023)   Hunger Vital Sign    Worried About  Running Out of Food in the Last Year: Never true    Ran Out of Food in the Last Year: Never true  Transportation Needs: No Transportation Needs (09/21/2023)   PRAPARE - Administrator, Civil Service (Medical): No    Lack of Transportation (Non-Medical): No  Physical Activity: Insufficiently Active (03/24/2023)   Exercise Vital Sign    Days of Exercise per Week: 2 days    Minutes of Exercise per Session: 20 min  Stress: No Stress Concern Present (03/24/2023)   [employer] of Occupational Health - Occupational Stress Questionnaire    Feeling of Stress : Only a little  Social Connections: Moderately Integrated (03/24/2023)   Social Connection and Isolation Panel [NHANES]    Frequency of Communication with Friends and Family: More than three times a week    Frequency of Social Gatherings with Friends and Family: Not on file    Attends Religious Services: More than 4 times per year    Active Member of [employer] or Organizations: No    Attends Banker Meetings: Never    Marital Status: Married  Catering manager Violence: Not  At Risk (09/21/2023)   Humiliation, Afraid, Rape, and Kick questionnaire    Fear of Current or Ex-Partner: No    Emotionally Abused: No    Physically Abused: No    Sexually Abused: No    Past Surgical History:  Procedure Laterality Date   BUNIONECTOMY Bilateral    carpal tunnel repair     COLONOSCOPY WITH PROPOFOL  N/A 08/19/2021   Procedure: COLONOSCOPY WITH PROPOFOL ;  Surgeon: Selena Daily, MD;  Location: [facility];  Service: Gastroenterology;  Laterality: N/A;   DENTAL SURGERY     ENDARTERECTOMY FEMORAL Bilateral 09/16/2023   Procedure: ENDARTERECTOMY FEMORAL (BILATERAL SFA STENTS);  Surgeon: Jackquelyn Mass, MD;  Location: [facility] ORS;  Service: Vascular;  Laterality: Bilateral;   FOOT SURGERY Right    LOWER EXTREMITY ANGIOGRAPHY Right 08/18/2023   Procedure: Lower Extremity Angiography;  Surgeon: Jackquelyn Mass, MD;  Location: [facility] INV[facility];  Service: Cardiovascular;  Laterality: Right;   TUBAL LIGATION     VAGINAL DELIVERY     x1    Family History  Problem Relation Age of Onset   Hypertension Mother    Colon cancer Mother 22   Atrial fibrillation Mother    Dementia Mother    Heart disease Father    Diabetes Father    Colon cancer Father 50   Congestive Heart Failure Father    COPD Sister    Crohn's disease Sister    Colon cancer Sister 75   Breast cancer Sister    Heart attack Sister    Heart disease Brother     Allergies  Allergen Reactions   Cat Dander    Pollen Extract Itching       Latest Ref Rng & Units 10/03/2023    4:23 PM 09/17/2023   12:01 AM 09/16/2023    3:19 PM  CBC  WBC 4.0 - 10.5 K/uL 12.8  9.8  9.0   Hemoglobin 12.0 - 15.0 g/dL 16.1  09.6  04.5   Hematocrit 36.0 - 46.0 % 37.3  32.7  33.9   Platelets 150 - 400 K/uL 526  178  197       CMP     Component Value Date/Time   NA 134 (L) 10/03/2023 1623   NA 140 04/19/2015 0000   K 4.0 10/03/2023 1623   CL 100 10/03/2023 1623  CO2 23 10/03/2023 1623   GLUCOSE 125  (H) 10/03/2023 1623   BUN 9 10/03/2023 1623   BUN 14 04/19/2015 0000   CREATININE 0.84 10/03/2023 1623   CALCIUM  8.7 (L) 10/03/2023 1623   PROT 7.2 10/03/2023 1623   PROT 7.1 02/24/2023 0919   ALBUMIN  3.7 10/03/2023 1623   ALBUMIN  4.4 02/24/2023 0919   AST 27 10/03/2023 1623   ALT 21 10/03/2023 1623   ALKPHOS 125 10/03/2023 1623   BILITOT 0.3 10/03/2023 1623   BILITOT 0.5 02/24/2023 0919   GFR 67.69 04/15/2023 1124   GFRNONAA >60 10/03/2023 1623     No results found.     Assessment & Plan:   1. Wound dehiscence (Primary) Patient the patient's wound is much improved compared to previous evaluation.  The proximal wound is essential healed and the distal wound has also become smaller  Patient will continue with current wound dressings and she will follow-up in 3 weeks for evaluation    Current Outpatient Medications on File Prior to Visit  Medication Sig Dispense Refill   acetaminophen  (TYLENOL ) 325 MG tablet Take 1-2 tablets (325-650 mg total) by mouth every 4 (four) hours as needed for mild pain (pain score 1-3) (or temp >/= 101 F).     amLODipine  (NORVASC ) 5 MG tablet Take 1 tablet (5 mg total) by mouth daily as needed. (Patient taking differently: Take 5 mg by mouth daily.) 90 tablet 3   aspirin  81 MG tablet Take 81 mg by mouth daily.     atorvastatin  (LIPITOR) 40 MG tablet Take 1 tablet (40 mg total) by mouth daily at 6 PM. 90 tablet 3   CALCIUM  PO Take 500 mg by mouth daily.     Cholecalciferol (VITAMIN D3 PO) Take 25 mcg by mouth daily.     clopidogrel  (PLAVIX ) 75 MG tablet Take 1 tablet (75 mg total) by mouth daily at 6 (six) AM. 30 tablet 5   Cyanocobalamin  (B-12 PO) Take 500 mcg by mouth daily.     ezetimibe  (ZETIA ) 10 MG tablet Take 1 tablet (10 mg total) by mouth daily. 90 tablet 3   hydrOXYzine  (ATARAX ) 25 MG tablet Take 1 tablet (25 mg total) by mouth 3 (three) times daily as needed. 30 tablet 0   meclizine  (ANTIVERT ) 12.5 MG tablet Take 1 tablet (12.5 mg total) by  mouth 3 (three) times daily as needed (vertigo). 30 tablet 0   Multiple Vitamins-Minerals (EQ MULTIVITAMINS ADULT GUMMY PO) Take 2 tablets by mouth daily.     oxyCODONE -acetaminophen  (PERCOCET/ROXICET) 5-325 MG tablet Take 1-2 tablets by mouth every 4 (four) hours as needed for moderate pain (pain score 4-6). 30 tablet 0   oxyCODONE -acetaminophen  (PERCOCET/ROXICET) 5-325 MG tablet Take 1-2 tablets by mouth every 4 (four) hours as needed for moderate pain 30 tablet 0   triamcinolone  ointment (KENALOG ) 0.5 % Apply 1 Application topically 2 (two) times daily. Use for < 1 week or less. 15 g 2   No current facility-administered medications on file prior to visit.    There are no Patient Instructions on file for this visit. No follow-ups on file.   Trisa Cranor E Aislee Landgren, NP

## 2024-02-08 ENCOUNTER — Other Ambulatory Visit: Payer: Self-pay

## 2024-02-24 ENCOUNTER — Other Ambulatory Visit: Payer: Self-pay

## 2024-02-24 ENCOUNTER — Ambulatory Visit: Attending: Family

## 2024-02-24 ENCOUNTER — Ambulatory Visit (INDEPENDENT_AMBULATORY_CARE_PROVIDER_SITE_OTHER): Payer: No Typology Code available for payment source | Admitting: Family

## 2024-02-24 ENCOUNTER — Encounter: Payer: Self-pay | Admitting: Family

## 2024-02-24 VITALS — BP 150/80 | HR 94 | Temp 97.7°F | Ht 63.0 in | Wt 139.8 lb

## 2024-02-24 DIAGNOSIS — R21 Rash and other nonspecific skin eruption: Secondary | ICD-10-CM

## 2024-02-24 DIAGNOSIS — I10 Essential (primary) hypertension: Secondary | ICD-10-CM | POA: Diagnosis not present

## 2024-02-24 DIAGNOSIS — R000 Tachycardia, unspecified: Secondary | ICD-10-CM | POA: Diagnosis not present

## 2024-02-24 DIAGNOSIS — F411 Generalized anxiety disorder: Secondary | ICD-10-CM | POA: Diagnosis not present

## 2024-02-24 MED ORDER — FLUOXETINE HCL 20 MG PO CAPS
20.0000 mg | ORAL_CAPSULE | Freq: Every morning | ORAL | 3 refills | Status: DC
  Filled 2024-02-24: qty 30, 30d supply, fill #0
  Filled 2024-03-23: qty 30, 30d supply, fill #1

## 2024-02-24 MED ORDER — AMLODIPINE BESYLATE 2.5 MG PO TABS
2.5000 mg | ORAL_TABLET | Freq: Every day | ORAL | 3 refills | Status: DC
  Filled 2024-02-24: qty 90, 90d supply, fill #0
  Filled 2024-05-20: qty 90, 90d supply, fill #1
  Filled 2024-06-20 – 2024-08-30 (×2): qty 90, 90d supply, fill #2

## 2024-02-24 NOTE — Progress Notes (Unsigned)
 Assessment & Plan:  GAD (generalized anxiety disorder) Assessment & Plan: Chronic, suboptimal control.  Restart Prozac  20 mg daily  Orders: -     FLUoxetine  HCl; Take 1 capsule (20 mg total) by mouth every morning.  Dispense: 30 capsule; Refill: 3  Tachycardia Assessment & Plan: HR returned to normal while she rested in exam room.  Discussed cigarette smoking, caffeine, anxiety and potential to raise HR. Pending zio  Orders: -     LONG TERM MONITOR (3-14 DAYS); Future  Primary hypertension Assessment & Plan: Chronic, suboptimal control .continue amlodipine  5mg  and add amlodipine  2.5mg  . Monitor for  leg swelling.   Orders: -     amLODIPine  Besylate; Take 1 tablet (2.5 mg total) by mouth daily.  Dispense: 90 tablet; Refill: 3  Rash Assessment & Plan: Presentation is consistent with stasis dermatitis secondary to suspected venous insufficiency. Consider formal testing for venous insufficiency.  Discussed if lymphedema is playing a role.  Inconsistent use of Kenalog .  Advised to be more consistent with use and to start using zip up compression stockings. Follow-up with vascular this week, and will appreciate advice as well.   Orders: -     For home use only DME Other see comment     Return precautions given.   Risks, benefits, and alternatives of the medications and treatment plan prescribed today were discussed, and patient expressed understanding.   Education regarding symptom management and diagnosis given to patient on AVS either electronically or printed.  Return in about 6 weeks (around 04/06/2024).  Bascom Bossier, FNP  Subjective:    [name], female    DOB: December 19, 1950, 73 y.o.   MRN: [id number]  CC: Wanda Clayton is a 73 y.o. female who presents today for follow up.   HPI: Accompanied by partner.    She would like to resume prozac  as she feels more nervous  She is not sleeping well due to leg pain, dogs in the bed.   Denies  depression  She is taking Zquil prn at bedtime with aid in sleep.     Complaint of continued rash lower  leg persistent , unchanged. She has been using  kenalog  0.5% a couple of times per week.   Compliant with amlodipine  5mg  every day. She had cigarette one hour ago : 1 cup coffee per day   BP earlier this morning 124/75, HR 113  Denies associated cp, a feeling of palpitation, syncope, dizziness  Echocardiogram 05/04/2023, left ventricular ejection fraction 55-60%.  No significant valvular disease   Allergies: Cat dander and Pollen extract Current Outpatient Medications on File Prior to Visit  Medication Sig Dispense Refill   acetaminophen  (TYLENOL ) 325 MG tablet Take 1-2 tablets (325-650 mg total) by mouth every 4 (four) hours as needed for mild pain (pain score 1-3) (or temp >/= 101 F).     amLODipine  (NORVASC ) 5 MG tablet Take 1 tablet (5 mg total) by mouth daily as needed. (Patient taking differently: Take 5 mg by mouth daily.) 90 tablet 3   aspirin  81 MG tablet Take 81 mg by mouth daily.     atorvastatin  (LIPITOR) 40 MG tablet Take 1 tablet (40 mg total) by mouth daily at 6 PM. 90 tablet 3   CALCIUM  PO Take 500 mg by mouth daily.     Cholecalciferol (VITAMIN D3 PO) Take 25 mcg by mouth daily.     clopidogrel  (PLAVIX ) 75 MG tablet Take 1 tablet (75 mg total) by mouth daily at  6 (six) AM. 30 tablet 5   Cyanocobalamin  (B-12 PO) Take 500 mcg by mouth daily.     ezetimibe  (ZETIA ) 10 MG tablet Take 1 tablet (10 mg total) by mouth daily. 90 tablet 3   hydrOXYzine  (ATARAX ) 25 MG tablet Take 1 tablet (25 mg total) by mouth 3 (three) times daily as needed. 30 tablet 0   meclizine  (ANTIVERT ) 12.5 MG tablet Take 1 tablet (12.5 mg total) by mouth 3 (three) times daily as needed (vertigo). 30 tablet 0   Multiple Vitamins-Minerals (EQ MULTIVITAMINS ADULT GUMMY PO) Take 2 tablets by mouth daily.     oxyCODONE -acetaminophen  (PERCOCET/ROXICET) 5-325 MG tablet Take 1-2 tablets by mouth every 4  (four) hours as needed for moderate pain (pain score 4-6). 30 tablet 0   oxyCODONE -acetaminophen  (PERCOCET/ROXICET) 5-325 MG tablet Take 1-2 tablets by mouth every 4 (four) hours as needed for moderate pain 30 tablet 0   triamcinolone  ointment (KENALOG ) 0.5 % Apply 1 Application topically 2 (two) times daily. Use for < 1 week or less. 15 g 2   No current facility-administered medications on file prior to visit.    Review of Systems  Constitutional:  Negative for chills and fever.  Respiratory:  Negative for cough.   Cardiovascular:  Negative for chest pain and palpitations.  Gastrointestinal:  Negative for nausea and vomiting.  Skin:  Positive for rash (BLE).  Psychiatric/Behavioral:  Positive for sleep disturbance. Negative for suicidal ideas.       Objective:    BP (!) 150/80   Pulse 94   Temp 97.7 F (36.5 C) (Oral)   Ht 5\' 3"  (1.6 m)   Wt 139 lb 12.8 oz (63.4 kg)   SpO2 97%   BMI 24.76 kg/m  BP Readings from Last 3 Encounters:  02/24/24 (!) 150/80  02/02/24 (!) 171/81  01/08/24 (!) 145/77   Wt Readings from Last 3 Encounters:  02/24/24 139 lb 12.8 oz (63.4 kg)  02/02/24 135 lb 9.6 oz (61.5 kg)  01/08/24 135 lb 9.6 oz (61.5 kg)      02/24/2024    9:27 AM 11/23/2023   10:26 AM 10/26/2023   10:24 AM  Depression screen PHQ 2/9  Decreased Interest 0 0 0  Down, Depressed, Hopeless 0 0 0  PHQ - 2 Score 0 0 0    Physical Exam Vitals reviewed.  Constitutional:      Appearance: She is well-developed.  Eyes:     Conjunctiva/sclera: Conjunctivae normal.  Cardiovascular:     Rate and Rhythm: Regular rhythm. Tachycardia present.     Pulses: Normal pulses.     Heart sounds: Normal heart sounds.     Comments: +1 nonpitting edema BLE No  palpable cords or masses. No erythema or increased warmth. No asymmetry in calf size when compared bilaterally  LE warm   Pulmonary:     Effort: Pulmonary effort is normal.     Breath sounds: Normal breath sounds. No wheezing,  rhonchi or rales.  Skin:    General: Skin is warm and dry.  Neurological:     Mental Status: She is alert.  Psychiatric:        Speech: Speech normal.        Behavior: Behavior normal.        Thought Content: Thought content normal.

## 2024-02-24 NOTE — Patient Instructions (Addendum)
 Compression stockings 20-30 mmHg max; zip up.   Discuss with [place] regarding venous stasis dermatitis versus lymphedema.  Start prozac  20mg  again  Let me know how you are doing  I have ordered a 14 day Zio monitor which will mailed directly to you. The device will include instructions on how to apply.   The Zio 14 day monitor that we use DOES NOT have 24 hour live monitoring. The rhythm of your heart will not be monitored while you are wearing it. Only AFTER you mail the device back in and a cardiologist interprets the data will we know if an underlying cardiac arrhythmia is going on.   There are models that offer 24 hour live monitoring however NOT this one. I wanted to be sure that you were aware of this as certainly didn't want this to be a false sense of security.   If you experience chest pain, shortness of breath, left arm numbness, or sustained, more frequent palpitations , do not wait and call 911 right away. We are in a delicate period of work up regarding palpitations and until we are sure that you do not have an underlying arrhythmia, you must be extremely vigilant and cautious.      ZIO XT- Long Term Monitor Instructions   Your provider has requested you wear a ZIO patch monitor for 14 days.  This is a single patch monitor. [employer] supplies one patch monitor per enrollment. Additional stickers are not available. Please do not apply patch if you will be having a Nuclear Stress Test,  Echocardiogram, Cardiac CT, MRI, or Chest Xray during the period you would be wearing the  monitor. The patch cannot be worn during these tests. You cannot remove and re-apply the  ZIO XT patch monitor.  Your ZIO patch monitor will be mailed 3 day USPS to your address on file. It may take 3-5 days  to receive your monitor after you have been enrolled.  Once you have received your monitor, please review the enclosed instructions. Your monitor  has already been registered assigning a specific  monitor serial # to you.   Billing and Patient Assistance Program Information   We have supplied [employer] with any of your insurance information on file for billing purposes. [employer] offers a sliding scale Patient Assistance Program for patients that do not have  insurance, or whose insurance does not completely cover the cost of the ZIO monitor.  You must apply for the Patient Assistance Program to qualify for this discounted rate.  To apply, please call [employer] at [phone], select option 4, select option 2, ask to apply for  Patient Assistance Program. Sanna Crystal will ask your household income, and how many people  are in your household. They will quote your out-of-pocket cost based on that information.  [employer] will also be able to set up a 83-month, interest-free payment plan if needed.   Applying the monitor   Shave hair from upper left chest.  Hold abrader disc by orange tab. Rub abrader in 40 strokes over the upper left chest as  indicated in your monitor instructions.  Clean area with 4 enclosed alcohol pads. Let dry.  Apply patch as indicated in monitor instructions. Patch will be placed under collarbone on left  side of chest with arrow pointing upward.  Rub patch adhesive wings for 2 minutes. Remove white label marked "1". Remove the white  label marked "2". Rub patch adhesive wings for 2 additional minutes.  While looking in a mirror, press  and release button in center of patch. A small green light will  flash 3-4 times. This will be your only indicator that the monitor has been turned on.  Do not shower for the first 24 hours. You may shower after the first 24 hours.  Press the button if you feel a symptom. You will hear a small click. Record Date, Time and  Symptom in the Patient Logbook.  When you are ready to remove the patch, follow instructions on the last 2 pages of Patient  Logbook. Stick patch monitor onto the last page of Patient Logbook.  Place Patient Logbook in  the blue and white box. Use locking tab on box and tape box closed  securely. The blue and white box has prepaid postage on it. Please place it in the mailbox as  soon as possible. Your physician should have your test results approximately 7 days after the  monitor has been mailed back to [facility].  Call [facility] Customer Care at [phone] if you have questions regarding  your ZIO XT patch monitor. Call them immediately if you see an orange light blinking on your  monitor.  If your monitor falls off in less than 4 days, contact our Monitor department at [phone].  If your monitor becomes loose or falls off after 4 days call [employer] at [phone] for  suggestions on securing your monitor

## 2024-02-25 ENCOUNTER — Other Ambulatory Visit: Payer: Self-pay

## 2024-02-25 DIAGNOSIS — F411 Generalized anxiety disorder: Secondary | ICD-10-CM | POA: Diagnosis not present

## 2024-02-25 DIAGNOSIS — E785 Hyperlipidemia, unspecified: Secondary | ICD-10-CM | POA: Diagnosis not present

## 2024-02-25 DIAGNOSIS — Z008 Encounter for other general examination: Secondary | ICD-10-CM | POA: Diagnosis not present

## 2024-02-25 DIAGNOSIS — I2510 Atherosclerotic heart disease of native coronary artery without angina pectoris: Secondary | ICD-10-CM | POA: Diagnosis not present

## 2024-02-25 DIAGNOSIS — I739 Peripheral vascular disease, unspecified: Secondary | ICD-10-CM | POA: Diagnosis not present

## 2024-02-25 DIAGNOSIS — K5190 Ulcerative colitis, unspecified, without complications: Secondary | ICD-10-CM | POA: Diagnosis not present

## 2024-02-25 DIAGNOSIS — J439 Emphysema, unspecified: Secondary | ICD-10-CM | POA: Diagnosis not present

## 2024-02-25 DIAGNOSIS — F1020 Alcohol dependence, uncomplicated: Secondary | ICD-10-CM | POA: Diagnosis not present

## 2024-02-25 DIAGNOSIS — L97129 Non-pressure chronic ulcer of left thigh with unspecified severity: Secondary | ICD-10-CM | POA: Diagnosis not present

## 2024-02-25 DIAGNOSIS — R2681 Unsteadiness on feet: Secondary | ICD-10-CM | POA: Diagnosis not present

## 2024-02-25 DIAGNOSIS — I10 Essential (primary) hypertension: Secondary | ICD-10-CM | POA: Diagnosis not present

## 2024-02-25 NOTE — Assessment & Plan Note (Addendum)
 Presentation is consistent with stasis dermatitis secondary to suspected venous insufficiency. Consider formal testing for venous insufficiency.  Discussed if lymphedema is playing a role.  Inconsistent use of Kenalog .  Advised to be more consistent with use and to start using zip up compression stockings. Follow-up with vascular this week, and will appreciate advice as well.

## 2024-02-25 NOTE — Assessment & Plan Note (Addendum)
 HR returned to normal while she rested in exam room.  Discussed cigarette smoking, caffeine, anxiety and potential to raise HR. Pending zio

## 2024-02-25 NOTE — Assessment & Plan Note (Signed)
 Chronic, suboptimal control.  Restart Prozac  20 mg daily

## 2024-02-25 NOTE — Assessment & Plan Note (Signed)
 Chronic, suboptimal control .continue amlodipine  5mg  and add amlodipine  2.5mg  . Monitor for  leg swelling.

## 2024-02-26 ENCOUNTER — Encounter (INDEPENDENT_AMBULATORY_CARE_PROVIDER_SITE_OTHER): Payer: Self-pay | Admitting: Nurse Practitioner

## 2024-02-26 ENCOUNTER — Ambulatory Visit (INDEPENDENT_AMBULATORY_CARE_PROVIDER_SITE_OTHER): Admitting: Nurse Practitioner

## 2024-02-26 VITALS — BP 172/73 | HR 94 | Resp 16 | Wt 139.0 lb

## 2024-02-26 DIAGNOSIS — T8130XA Disruption of wound, unspecified, initial encounter: Secondary | ICD-10-CM

## 2024-02-26 DIAGNOSIS — I872 Venous insufficiency (chronic) (peripheral): Secondary | ICD-10-CM

## 2024-02-28 ENCOUNTER — Encounter (INDEPENDENT_AMBULATORY_CARE_PROVIDER_SITE_OTHER): Payer: Self-pay | Admitting: Nurse Practitioner

## 2024-02-28 NOTE — Progress Notes (Signed)
 Subjective:    [name], female    DOB: 02-23-1951, 73 y.o.   MRN: [id number] Chief Complaint  Patient presents with   Follow-up    3-4 week wound check    The patient returns to the office for followup and review status post angiogram with intervention on 09/16/2023.  The previous wound has continued to remain healed and the more distal wound continues to heal with use of Medihoney.  She continues to have swelling and some discoloration of her lower extremity.  Some of this appears to be more so stasis dermatitis in nature.     Review of Systems  Skin:  Positive for wound.  All other systems reviewed and are negative.      Objective:   Physical Exam Vitals reviewed.  HENT:     Head: Normocephalic.  Cardiovascular:     Rate and Rhythm: Normal rate.  Pulmonary:     Effort: Pulmonary effort is normal.  Skin:    General: Skin is warm and dry.  Neurological:     Mental Status: She is alert and oriented to person, place, and time.  Psychiatric:        Mood and Affect: Mood normal.        Behavior: Behavior normal.        Thought Content: Thought content normal.        Judgment: Judgment normal.     BP (!) 172/73   Pulse 94   Resp 16   Wt 139 lb (63 kg)   BMI 24.62 kg/m   Past Medical History:  Diagnosis Date   Acute pain of left foot 02/08/2020   Anxiety    Coronary artery disease    Depression    Dyspnea    Elevated liver enzymes 11/28/2020   High cholesterol    Hypertension    Peripheral vascular disease (HCC)     Social History   Socioeconomic History   Marital status: Married    Spouse name: Not on file   Number of children: 1   Years of education: Not on file   Highest education level: Not on file  Occupational History    Employer: [employer]  Tobacco Use   Smoking status: Every Day    Current packs/day: 0.50    Average packs/day: 0.5 packs/day for 36.0 years (18.0 ttl pk-yrs)    Types: Cigarettes   Smokeless  tobacco: Never   Tobacco comments:    she plans to quit without medication  Vaping Use   Vaping status: Never Used  Substance and Sexual Activity   Alcohol use: Yes    Alcohol/week: 21.0 standard drinks of alcohol    Types: 21 Glasses of wine per week   Drug use: No   Sexual activity: Not Currently    Birth control/protection: None, Post-menopausal  Other Topics Concern   Not on file  Social History Narrative   Works for [employer], retired 06/2016   Married.   Daughter and granddaughter.          Social Drivers of Corporate investment banker Strain: Low Risk  (03/24/2023)   Overall Financial Resource Strain (CARDIA)    Difficulty of Paying Living Expenses: Not hard at all  Food Insecurity: No Food Insecurity (09/21/2023)   Hunger Vital Sign    Worried About Running Out of Food in the Last Year: Never true    Ran Out of Food in the Last Year: Never true  Transportation  Needs: No Transportation Needs (09/21/2023)   PRAPARE - Administrator, Civil Service (Medical): No    Lack of Transportation (Non-Medical): No  Physical Activity: Insufficiently Active (03/24/2023)   Exercise Vital Sign    Days of Exercise per Week: 2 days    Minutes of Exercise per Session: 20 min  Stress: No Stress Concern Present (03/24/2023)   [employer] of Occupational Health - Occupational Stress Questionnaire    Feeling of Stress : Only a little  Social Connections: Moderately Integrated (03/24/2023)   Social Connection and Isolation Panel [NHANES]    Frequency of Communication with Friends and Family: More than three times a week    Frequency of Social Gatherings with Friends and Family: Not on file    Attends Religious Services: More than 4 times per year    Active Member of [employer] or Organizations: No    Attends Banker Meetings: Never    Marital Status: Married  Catering manager Violence: Not At Risk (09/21/2023)   Humiliation, Afraid, Rape, and Kick questionnaire     Fear of Current or Ex-Partner: No    Emotionally Abused: No    Physically Abused: No    Sexually Abused: No    Past Surgical History:  Procedure Laterality Date   BUNIONECTOMY Bilateral    carpal tunnel repair     COLONOSCOPY WITH PROPOFOL  N/A 08/19/2021   Procedure: COLONOSCOPY WITH PROPOFOL ;  Surgeon: Selena Daily, MD;  Location: [facility];  Service: Gastroenterology;  Laterality: N/A;   DENTAL SURGERY     ENDARTERECTOMY FEMORAL Bilateral 09/16/2023   Procedure: ENDARTERECTOMY FEMORAL (BILATERAL SFA STENTS);  Surgeon: Jackquelyn Mass, MD;  Location: [facility] OR[facility];  Service: Vascular;  Laterality: Bilateral;   FOOT SURGERY Right    LOWER EXTREMITY ANGIOGRAPHY Right 08/18/2023   Procedure: Lower Extremity Angiography;  Surgeon: Jackquelyn Mass, MD;  Location: [facility];  Service: Cardiovascular;  Laterality: Right;   TUBAL LIGATION     VAGINAL DELIVERY     x1    Family History  Problem Relation Age of Onset   Hypertension Mother    Colon cancer Mother 16   Atrial fibrillation Mother    Dementia Mother    Heart disease Father    Diabetes Father    Colon cancer Father 48   Congestive Heart Failure Father    COPD Sister    Crohn's disease Sister    Colon cancer Sister 49   Breast cancer Sister    Heart attack Sister    Heart disease Brother     Allergies  Allergen Reactions   Cat Dander    Pollen Extract Itching       Latest Ref Rng & Units 10/03/2023    4:23 PM 09/17/2023   12:01 AM 09/16/2023    3:19 PM  CBC  WBC 4.0 - 10.5 K/uL 12.8  9.8  9.0   Hemoglobin 12.0 - 15.0 g/dL 16.1  09.6  04.5   Hematocrit 36.0 - 46.0 % 37.3  32.7  33.9   Platelets 150 - 400 K/uL 526  178  197       CMP     Component Value Date/Time   NA 134 (L) 10/03/2023 1623   NA 140 04/19/2015 0000   K 4.0 10/03/2023 1623   CL 100 10/03/2023 1623   CO2 23 10/03/2023 1623   GLUCOSE 125 (H) 10/03/2023 1623   BUN 9 10/03/2023 1623   BUN 14 04/19/2015 0000  CREATININE 0.84 10/03/2023 1623   CALCIUM  8.7 (L) 10/03/2023 1623   PROT 7.2 10/03/2023 1623   PROT 7.1 02/24/2023 0919   ALBUMIN  3.7 10/03/2023 1623   ALBUMIN  4.4 02/24/2023 0919   AST 27 10/03/2023 1623   ALT 21 10/03/2023 1623   ALKPHOS 125 10/03/2023 1623   BILITOT 0.3 10/03/2023 1623   BILITOT 0.5 02/24/2023 0919   GFR 67.69 04/15/2023 1124   GFRNONAA >60 10/03/2023 1623     No results found.     Assessment & Plan:   1. Wound dehiscence (Primary) Patient the patient's wound is much improved compared to previous evaluation.  The proximal wound is essential healed and the distal wound has also become smaller  Patient will continue with current wound dressings and she will follow-up in 3 to 4 weeks for evaluation when she returns we will have the patient undergo ABIs.    2. Stasis dermatitis Based on patient's appearance of her lower leg and likely has underlying stasis dermatitis.  We have never evaluated her for venous insufficiency.  When she returns she will undergo noninvasive studies to evaluate for possible venous reflux.  She is advised to continue with use of medical grade compression and elevation for treatment.    Current Outpatient Medications on File Prior to Visit  Medication Sig Dispense Refill   acetaminophen  (TYLENOL ) 325 MG tablet Take 1-2 tablets (325-650 mg total) by mouth every 4 (four) hours as needed for mild pain (pain score 1-3) (or temp >/= 101 F).     amLODipine  (NORVASC ) 2.5 MG tablet Take 1 tablet (2.5 mg total) by mouth daily. 90 tablet 3   amLODipine  (NORVASC ) 5 MG tablet Take 1 tablet (5 mg total) by mouth daily as needed. (Patient taking differently: Take 5 mg by mouth daily.) 90 tablet 3   aspirin  81 MG tablet Take 81 mg by mouth daily.     atorvastatin  (LIPITOR) 40 MG tablet Take 1 tablet (40 mg total) by mouth daily at 6 PM. 90 tablet 3   CALCIUM  PO Take 500 mg by mouth daily.     Cholecalciferol (VITAMIN D3 PO) Take 25 mcg by mouth daily.      clopidogrel  (PLAVIX ) 75 MG tablet Take 1 tablet (75 mg total) by mouth daily at 6 (six) AM. 30 tablet 5   Cyanocobalamin  (B-12 PO) Take 500 mcg by mouth daily.     ezetimibe  (ZETIA ) 10 MG tablet Take 1 tablet (10 mg total) by mouth daily. 90 tablet 3   FLUoxetine  (PROZAC ) 20 MG capsule Take 1 capsule (20 mg total) by mouth every morning. 30 capsule 3   hydrOXYzine  (ATARAX ) 25 MG tablet Take 1 tablet (25 mg total) by mouth 3 (three) times daily as needed. 30 tablet 0   meclizine  (ANTIVERT ) 12.5 MG tablet Take 1 tablet (12.5 mg total) by mouth 3 (three) times daily as needed (vertigo). 30 tablet 0   Multiple Vitamins-Minerals (EQ MULTIVITAMINS ADULT GUMMY PO) Take 2 tablets by mouth daily.     oxyCODONE -acetaminophen  (PERCOCET/ROXICET) 5-325 MG tablet Take 1-2 tablets by mouth every 4 (four) hours as needed for moderate pain (pain score 4-6). 30 tablet 0   oxyCODONE -acetaminophen  (PERCOCET/ROXICET) 5-325 MG tablet Take 1-2 tablets by mouth every 4 (four) hours as needed for moderate pain 30 tablet 0   triamcinolone  ointment (KENALOG ) 0.5 % Apply 1 Application topically 2 (two) times daily. Use for < 1 week or less. 15 g 2   No current facility-administered medications on file prior  to visit.    There are no Patient Instructions on file for this visit. No follow-ups on file.   Jvon Meroney E Eather Chaires, NP

## 2024-03-01 ENCOUNTER — Encounter (INDEPENDENT_AMBULATORY_CARE_PROVIDER_SITE_OTHER): Payer: Self-pay

## 2024-03-09 ENCOUNTER — Other Ambulatory Visit: Payer: Self-pay

## 2024-03-14 ENCOUNTER — Telehealth: Payer: Self-pay

## 2024-03-14 DIAGNOSIS — E538 Deficiency of other specified B group vitamins: Secondary | ICD-10-CM

## 2024-03-14 DIAGNOSIS — I10 Essential (primary) hypertension: Secondary | ICD-10-CM

## 2024-03-14 DIAGNOSIS — I6523 Occlusion and stenosis of bilateral carotid arteries: Secondary | ICD-10-CM

## 2024-03-14 NOTE — Telephone Encounter (Signed)
 Copied from CRM [id number]. Topic: Clinical - Medication Question >> Mar 14, 2024  1:12 PM Baldo Levan wrote: Reason for CRM: Patient called in requesting to have updated blood work before the appointment with Daivd Dub on the 25th. Patient also stated that she feels as if the FLUoxetine  (PROZAC ) 20 MG capsule [[id number]] is not working the best.

## 2024-03-16 DIAGNOSIS — L821 Other seborrheic keratosis: Secondary | ICD-10-CM | POA: Diagnosis not present

## 2024-03-16 DIAGNOSIS — I872 Venous insufficiency (chronic) (peripheral): Secondary | ICD-10-CM | POA: Diagnosis not present

## 2024-03-16 DIAGNOSIS — L853 Xerosis cutis: Secondary | ICD-10-CM | POA: Diagnosis not present

## 2024-03-16 NOTE — Telephone Encounter (Signed)
 Fasting labs ordered, please sch In regards, to prozac , please triage and ensure no suicidal or homicidal ideation.  Does she need sooner appt?   Would she like to increase from prozac  20mg  to 40mg  ? Or we can discuss in detail during appt

## 2024-03-16 NOTE — Telephone Encounter (Signed)
 What labs do you want me to order so I can schedule her before her appt

## 2024-03-16 NOTE — Addendum Note (Signed)
 Addended by: Calista Catching on: 03/16/2024 10:01 PM   Modules accepted: Orders

## 2024-03-17 NOTE — Telephone Encounter (Signed)
 Spoke tom pt she is not having any suicidal or homicidal ideations, scheduled lab appt, and she also stated that she did not want to increase Prozac  yet, she stated that yall could discuss at her next appt   Fasting labs ordered, please sch In regards, to prozac , please triage and ensure no suicidal or homicidal ideation.  Does she need sooner appt?    Would she like to increase from prozac  20mg  to 40mg  ? Or we can discuss in detail during appt

## 2024-03-22 ENCOUNTER — Other Ambulatory Visit (INDEPENDENT_AMBULATORY_CARE_PROVIDER_SITE_OTHER): Payer: Self-pay | Admitting: Nurse Practitioner

## 2024-03-22 DIAGNOSIS — T8130XA Disruption of wound, unspecified, initial encounter: Secondary | ICD-10-CM

## 2024-03-22 DIAGNOSIS — I872 Venous insufficiency (chronic) (peripheral): Secondary | ICD-10-CM

## 2024-03-23 ENCOUNTER — Ambulatory Visit (INDEPENDENT_AMBULATORY_CARE_PROVIDER_SITE_OTHER)

## 2024-03-23 ENCOUNTER — Other Ambulatory Visit (INDEPENDENT_AMBULATORY_CARE_PROVIDER_SITE_OTHER): Payer: Self-pay | Admitting: Nurse Practitioner

## 2024-03-23 DIAGNOSIS — Z9889 Other specified postprocedural states: Secondary | ICD-10-CM

## 2024-03-23 DIAGNOSIS — I872 Venous insufficiency (chronic) (peripheral): Secondary | ICD-10-CM

## 2024-03-23 DIAGNOSIS — I739 Peripheral vascular disease, unspecified: Secondary | ICD-10-CM | POA: Diagnosis not present

## 2024-03-23 DIAGNOSIS — T8130XA Disruption of wound, unspecified, initial encounter: Secondary | ICD-10-CM

## 2024-03-24 DIAGNOSIS — R000 Tachycardia, unspecified: Secondary | ICD-10-CM

## 2024-03-24 LAB — VAS US ABI WITH/WO TBI
Left ABI: 1.01
Right ABI: 0.79

## 2024-03-25 ENCOUNTER — Encounter (INDEPENDENT_AMBULATORY_CARE_PROVIDER_SITE_OTHER): Payer: Self-pay | Admitting: Nurse Practitioner

## 2024-03-25 ENCOUNTER — Ambulatory Visit (INDEPENDENT_AMBULATORY_CARE_PROVIDER_SITE_OTHER): Admitting: Nurse Practitioner

## 2024-03-25 ENCOUNTER — Ambulatory Visit (INDEPENDENT_AMBULATORY_CARE_PROVIDER_SITE_OTHER): Payer: No Typology Code available for payment source

## 2024-03-25 VITALS — BP 155/77 | HR 105 | Resp 18 | Wt 136.6 lb

## 2024-03-25 VITALS — Ht 63.0 in | Wt 139.0 lb

## 2024-03-25 DIAGNOSIS — Z9889 Other specified postprocedural states: Secondary | ICD-10-CM | POA: Diagnosis not present

## 2024-03-25 DIAGNOSIS — Z0000 Encounter for general adult medical examination without abnormal findings: Secondary | ICD-10-CM

## 2024-03-25 DIAGNOSIS — I872 Venous insufficiency (chronic) (peripheral): Secondary | ICD-10-CM | POA: Diagnosis not present

## 2024-03-25 DIAGNOSIS — I739 Peripheral vascular disease, unspecified: Secondary | ICD-10-CM | POA: Diagnosis not present

## 2024-03-25 DIAGNOSIS — T8130XA Disruption of wound, unspecified, initial encounter: Secondary | ICD-10-CM

## 2024-03-25 NOTE — Progress Notes (Signed)
 Subjective:    [name], female    DOB: [birth year], 73 y.o.   MRN: [id number] Chief Complaint  Patient presents with   Follow-up    3-4 wk abi and ble venous reflux     The patient returns to the office for followup and review status post bilateral femoral endarterectomies with intervention on 09/16/2023.  The previous wound of the left has continued to remain healed and the more distal wound continues to heal with use of Medihoney.  She continues to have swelling and some discoloration of her lower extremity.  Some of this appears to be more so stasis dermatitis in nature.  She recently visited dermatology for this and obtained a regimen to help with this.  She denies any claudication-like symptoms.  She denies any rest pain like symptoms.  The small area continues to get even smaller and it is almost the size of a pinhole at this point.  Today the patient had an ABI 0.79 on the right and 1.01 on the left.  Previously she had an ABI 1.19 on the right and 1.11 on the left.  She has a notable decrease in the right lower extremity.  She has a 50 to 75% stenosis in the mid SFA.  Additionally the patient has no evidence of DVT or superficial thrombophlebitis bilaterally.  No evidence of deep venous insufficiency or superficial venous reflux bilaterally.    Review of Systems  Cardiovascular:  Positive for leg swelling.  Skin:  Positive for wound.  All other systems reviewed and are negative.      Objective:   Physical Exam Vitals reviewed.  HENT:     Head: Normocephalic.   Cardiovascular:     Rate and Rhythm: Normal rate.     Pulses:          Dorsalis pedis pulses are detected w/ Doppler on the right side and detected w/ Doppler on the left side.       Posterior tibial pulses are detected w/ Doppler on the right side and detected w/ Doppler on the left side.  Pulmonary:     Effort: Pulmonary effort is normal.   Skin:    General: Skin is warm and dry.   Neurological:      Mental Status: She is alert and oriented to person, place, and time.   Psychiatric:        Mood and Affect: Mood normal.        Behavior: Behavior normal.        Thought Content: Thought content normal.        Judgment: Judgment normal.     BP (!) 155/77   Pulse (!) 105   Resp 18   Wt 136 lb 9.6 oz (62 kg)   BMI 24.20 kg/m   Past Medical History:  Diagnosis Date   Acute pain of left foot 02/08/2020   Anxiety    Coronary artery disease    Depression    Dyspnea    Elevated liver enzymes 11/28/2020   High cholesterol    Hypertension    Peripheral vascular disease (HCC)     Social History   Socioeconomic History   Marital status: Married    Spouse name: Wakeelah Solan   Number of children: 1   Years of education: Not on file   Highest education level: Not on file  Occupational History    Employer: [employer]  Tobacco Use   Smoking status: Every Day  Current packs/day: 0.50    Average packs/day: 0.5 packs/day for 36.0 years (18.0 ttl pk-yrs)    Types: Cigarettes   Smokeless tobacco: Never   Tobacco comments:    she plans to quit without medication  Vaping Use   Vaping status: Never Used  Substance and Sexual Activity   Alcohol use: Yes    Alcohol/week: 14.0 standard drinks of alcohol    Types: 14 Glasses of wine per week   Drug use: No   Sexual activity: Not Currently    Birth control/protection: None, Post-menopausal  Other Topics Concern   Not on file  Social History Narrative   Works for [employer], retired 06/2016   Married.   Daughter and granddaughter.          Social Drivers of Health   Financial Resource Strain: Patient Declined (03/25/2024)   Overall Financial Resource Strain (CARDIA)    Difficulty of Paying Living Expenses: Patient declined  Food Insecurity: No Food Insecurity (03/25/2024)   Hunger Vital Sign    Worried About Running Out of Food in the Last Year: Never true    Ran Out of Food in the Last Year: Never true   Transportation Needs: No Transportation Needs (03/25/2024)   PRAPARE - Administrator, Civil Service (Medical): No    Lack of Transportation (Non-Medical): No  Physical Activity: Inactive (03/25/2024)   Exercise Vital Sign    Days of Exercise per Week: 0 days    Minutes of Exercise per Session: 0 min  Stress: No Stress Concern Present (03/25/2024)   [employer] of Occupational Health - Occupational Stress Questionnaire    Feeling of Stress: Only a little  Social Connections: Moderately Integrated (03/25/2024)   Social Connection and Isolation Panel    Frequency of Communication with Friends and Family: More than three times a week    Frequency of Social Gatherings with Friends and Family: Twice a week    Attends Religious Services: More than 4 times per year    Active Member of [employer] or Organizations: No    Attends Banker Meetings: Never    Marital Status: Married  Catering manager Violence: Not At Risk (03/25/2024)   Humiliation, Afraid, Rape, and Kick questionnaire    Fear of Current or Ex-Partner: No    Emotionally Abused: No    Physically Abused: No    Sexually Abused: No    Past Surgical History:  Procedure Laterality Date   BUNIONECTOMY Bilateral    carpal tunnel repair     COLONOSCOPY WITH PROPOFOL  N/A 08/19/2021   Procedure: COLONOSCOPY WITH PROPOFOL ;  Surgeon: Selena Daily, MD;  Location: [facility];  Service: Gastroenterology;  Laterality: N/A;   DENTAL SURGERY     ENDARTERECTOMY FEMORAL Bilateral 09/16/2023   Procedure: ENDARTERECTOMY FEMORAL (BILATERAL SFA STENTS);  Surgeon: Jackquelyn Mass, MD;  Location: [facility] ORS;  Service: Vascular;  Laterality: Bilateral;   FOOT SURGERY Right    LOWER EXTREMITY ANGIOGRAPHY Right 08/18/2023   Procedure: Lower Extremity Angiography;  Surgeon: Jackquelyn Mass, MD;  Location: [facility];  Service: Cardiovascular;  Laterality: Right;   TUBAL LIGATION     VAGINAL DELIVERY     x1     Family History  Problem Relation Age of Onset   Hypertension Mother    Colon cancer Mother 28   Atrial fibrillation Mother    Dementia Mother    Heart disease Father    Diabetes Father    Colon cancer Father  86   Congestive Heart Failure Father    COPD Sister    Crohn's disease Sister    Colon cancer Sister 37   Breast cancer Sister    Heart attack Sister    Heart disease Brother    Hypertension Daughter     Allergies  Allergen Reactions   Cat Dander    Pollen Extract Itching       Latest Ref Rng & Units 10/03/2023    4:23 PM 09/17/2023   12:01 AM 09/16/2023    3:19 PM  CBC  WBC 4.0 - 10.5 K/uL 12.8  9.8  9.0   Hemoglobin 12.0 - 15.0 g/dL 16.1  09.6  04.5   Hematocrit 36.0 - 46.0 % 37.3  32.7  33.9   Platelets 150 - 400 K/uL 526  178  197       CMP     Component Value Date/Time   NA 134 (L) 10/03/2023 1623   NA 140 04/19/2015 0000   K 4.0 10/03/2023 1623   CL 100 10/03/2023 1623   CO2 23 10/03/2023 1623   GLUCOSE 125 (H) 10/03/2023 1623   BUN 9 10/03/2023 1623   BUN 14 04/19/2015 0000   CREATININE 0.84 10/03/2023 1623   CALCIUM  8.7 (L) 10/03/2023 1623   PROT 7.2 10/03/2023 1623   PROT 7.1 02/24/2023 0919   ALBUMIN  3.7 10/03/2023 1623   ALBUMIN  4.4 02/24/2023 0919   AST 27 10/03/2023 1623   ALT 21 10/03/2023 1623   ALKPHOS 125 10/03/2023 1623   BILITOT 0.3 10/03/2023 1623   BILITOT 0.5 02/24/2023 0919   GFR 67.69 04/15/2023 1124   GFRNONAA >60 10/03/2023 1623     VAS US  ABI WITH/WO TBI Result Date: 03/24/2024  LOWER EXTREMITY DOPPLER STUDY Patient Name:  VENICE MARCUCCI [facility]  Date of Exam:   03/23/2024 Medical Rec #: [id number]       Accession #:    [id number] Date of Birth: [birth year]        Patient Gender: F Patient Age:   4 years Exam [facility] Procedure:      VAS US  ABI WITH/WO TBI Referring Phys: Sharla Davis --------------------------------------------------------------------------------  Indications: Peripheral artery  disease.  Vascular Interventions: 10/06/23: Bilateral CFA/SFA endarterectomies & bilateral                         SFA stents; Performing Technologist: Florie Husband RT, RDMS, RVT  Examination Guidelines: A complete evaluation includes at minimum, Doppler waveform signals and systolic blood pressure reading at the level of bilateral brachial, anterior tibial, and posterior tibial arteries, when vessel segments are accessible. Bilateral testing is considered an integral part of a complete examination. Photoelectric Plethysmograph (PPG) waveforms and toe systolic pressure readings are included as required and additional duplex testing as needed. Limited examinations for reoccurring indications may be performed as noted.  ABI Findings: +---------+------------------+-----+-------------------+-----------------------+ Right    Rt Pressure (mmHg)IndexWaveform           Comment                 +---------+------------------+-----+-------------------+-----------------------+ Brachial 159                                                               +---------+------------------+-----+-------------------+-----------------------+  PTA      111               0.70 dampened monophasicappears to be                                                              collateralization by                                                       duplex                  +---------+------------------+-----+-------------------+-----------------------+ PERO                            biphasic           by duplex               +---------+------------------+-----+-------------------+-----------------------+ DP       126               0.79 monophasic                                 +---------+------------------+-----+-------------------+-----------------------+ Great Toe89                0.56 Abnormal                                    +---------+------------------+-----+-------------------+-----------------------+ +---------+------------------+-----+--------+-------+ Left     Lt Pressure (mmHg)IndexWaveformComment +---------+------------------+-----+--------+-------+ Brachial 101                                    +---------+------------------+-----+--------+-------+ PTA      160               1.01 biphasic        +---------+------------------+-----+--------+-------+ DP       153               0.96 biphasic        +---------+------------------+-----+--------+-------+ Great Toe85                0.53 Abnormal        +---------+------------------+-----+--------+-------+ +-------+-----------+-----------+------------+------------+ ABI/TBIToday's ABIToday's TBIPrevious ABIPrevious TBI +-------+-----------+-----------+------------+------------+ Right  0.79       0.56       1.19        0.81         +-------+-----------+-----------+------------+------------+ Left   1.01       0.53       1.11        0.74         +-------+-----------+-----------+------------+------------+ Right ABIs appear decreased compared to prior study on 10/16/23. Left ABIs appear essentially unchanged. Known significant difference in the brachial pressures as well as known retrograde left vertebral artery when compared to previous exams.  Summary: Right: Resting right ankle-brachial index indicates moderate right lower extremity arterial disease. The right toe-brachial index is abnormal. Left:  Resting left ankle-brachial index is within normal range. The left toe-brachial index is abnormal. *See table(s) above for measurements and observations.  Electronically signed by Devon Fogo MD on 03/24/2024 at 7:47:00 AM.    Final        Assessment & Plan:   1. Peripheral arterial disease with history of revascularization (HCC) (Primary) Today the patient studies note that she has decreased ABI on the right to 0.79.  There is also a 50 to  75% stenosis.  Currently she does not have any significant claudication symptoms or development of any rest pain or ulcerations.  Based on this we will continue to manage this conservatively.  In addition her left groin still has a small area that needs to be healed and I would prefer for her left groin to be completely healed before we try to do any endovascular intervention.  In order to try to help her claudication symptoms have advised the patient to try to walk on a more regular and consistent basis.  I have also urged smoking cessation.  2. Wound dehiscence The wound is very small.  She is advised to place a hydrocolloid Band-Aid over the wound until it is healed.  Will have her return in 4 weeks.  3. Stasis dermatitis Patient was recently seen dermatology regarding her dry skin that is noted.  They will continue to follow her regarding this.  However she has no evidence of venous reflux noted bilaterally.   Current Outpatient Medications on File Prior to Visit  Medication Sig Dispense Refill   acetaminophen  (TYLENOL ) 325 MG tablet Take 1-2 tablets (325-650 mg total) by mouth every 4 (four) hours as needed for mild pain (pain score 1-3) (or temp >/= 101 F).     amLODipine  (NORVASC ) 2.5 MG tablet Take 1 tablet (2.5 mg total) by mouth daily. 90 tablet 3   amLODipine  (NORVASC ) 5 MG tablet Take 1 tablet (5 mg total) by mouth daily as needed. 90 tablet 3   aspirin  81 MG tablet Take 81 mg by mouth daily.     atorvastatin  (LIPITOR) 40 MG tablet Take 1 tablet (40 mg total) by mouth daily at 6 PM. 90 tablet 3   CALCIUM  PO Take 500 mg by mouth daily.     Cholecalciferol (VITAMIN D3 PO) Take 25 mcg by mouth daily.     clopidogrel  (PLAVIX ) 75 MG tablet Take 1 tablet (75 mg total) by mouth daily at 6 (six) AM. 30 tablet 5   Cyanocobalamin  (B-12 PO) Take 500 mcg by mouth daily.     ezetimibe  (ZETIA ) 10 MG tablet Take 1 tablet (10 mg total) by mouth daily. 90 tablet 3   FLUoxetine  (PROZAC ) 20 MG capsule  Take 1 capsule (20 mg total) by mouth every morning. 30 capsule 3   Multiple Vitamins-Minerals (EQ MULTIVITAMINS ADULT GUMMY PO) Take 2 tablets by mouth daily.     hydrOXYzine  (ATARAX ) 25 MG tablet Take 1 tablet (25 mg total) by mouth 3 (three) times daily as needed. (Patient not taking: Reported on 03/25/2024) 30 tablet 0   meclizine  (ANTIVERT ) 12.5 MG tablet Take 1 tablet (12.5 mg total) by mouth 3 (three) times daily as needed (vertigo). (Patient not taking: Reported on 03/25/2024) 30 tablet 0   oxyCODONE -acetaminophen  (PERCOCET/ROXICET) 5-325 MG tablet Take 1-2 tablets by mouth every 4 (four) hours as needed for moderate pain (pain score 4-6). (Patient not taking: Reported on 03/25/2024) 30 tablet 0   oxyCODONE -acetaminophen  (PERCOCET/ROXICET) 5-325 MG tablet Take 1-2 tablets by mouth  every 4 (four) hours as needed for moderate pain (Patient not taking: Reported on 03/25/2024) 30 tablet 0   triamcinolone  ointment (KENALOG ) 0.5 % Apply 1 Application topically 2 (two) times daily. Use for < 1 week or less. (Patient not taking: Reported on 03/25/2024) 15 g 2   No current facility-administered medications on file prior to visit.    There are no Patient Instructions on file for this visit. No follow-ups on file.   Brieana Shimmin E Rahma Meller, NP

## 2024-03-25 NOTE — Progress Notes (Signed)
 Subjective:   Wanda Clayton is a 73 y.o. who presents for a Medicare Wellness preventive visit.  As a reminder, Annual Wellness Visits don't include a physical exam, and some assessments may be limited, especially if this visit is performed virtually. We may recommend an in-person follow-up visit with your provider if needed.  Visit Complete: Virtual I connected with  Wanda Clayton on 03/25/24 by a audio enabled telemedicine application and verified that I am speaking with the correct person using two identifiers.  Patient Location: Home  Provider Location: Home Office  I discussed the limitations of evaluation and management by telemedicine. The patient expressed understanding and agreed to proceed.  Vital Signs: Because this visit was a virtual/telehealth visit, some criteria may be missing or patient reported. Any vitals not documented were not able to be obtained and vitals that have been documented are patient reported. Unable to assess BP today. Advised patient to monitor since it has been running high recently.  VideoDeclined- This patient declined Librarian, academic. Therefore the visit was completed with audio only.  Persons Participating in Visit: Patient.  AWV Questionnaire: No: Patient Medicare AWV questionnaire was not completed prior to this visit.  Cardiac Risk Factors include: advanced age (>30men, >60 women);dyslipidemia;hypertension;Other (see comment), Risk factor comments: CAD, atherosclerosis     Objective:    Today's Vitals   03/25/24 0813  Weight: 139 lb (63 kg)  Height: 5' 3 (1.6 m)   Body mass index is 24.62 kg/m.     03/25/2024    8:31 AM 10/03/2023    4:16 PM 09/18/2023    7:06 PM 09/15/2023    8:17 AM 08/18/2023    7:31 AM 03/24/2023    8:43 AM 03/13/2022    4:12 PM  Advanced Directives  Does Patient Have a Medical Advance Directive? Yes Yes Yes Yes Yes No Yes  Type of Estate agent of  [place];Living will Living will;Healthcare Power of [employer] Power of [employer] Power of [place];Living will Healthcare Power of [place];Living will  Healthcare Power of [place];Living will  Does patient want to make changes to medical advance directive?   No - Patient declined No - Patient declined   No - Patient declined  Copy of Healthcare Power of Attorney in Chart? No - copy requested Yes - validated most recent copy scanned in chart (See row information) No - copy requested No - copy requested   No - copy requested  Would patient like information on creating a medical advance directive?      No - Patient declined     Current Medications (verified) Outpatient Encounter Medications as of 03/25/2024  Medication Sig   acetaminophen  (TYLENOL ) 325 MG tablet Take 1-2 tablets (325-650 mg total) by mouth every 4 (four) hours as needed for mild pain (pain score 1-3) (or temp >/= 101 F).   amLODipine  (NORVASC ) 2.5 MG tablet Take 1 tablet (2.5 mg total) by mouth daily.   amLODipine  (NORVASC ) 5 MG tablet Take 1 tablet (5 mg total) by mouth daily as needed. (Patient taking differently: Take 5 mg by mouth daily.)   aspirin  81 MG tablet Take 81 mg by mouth daily.   atorvastatin  (LIPITOR) 40 MG tablet Take 1 tablet (40 mg total) by mouth daily at 6 PM.   CALCIUM  PO Take 500 mg by mouth daily.   Cholecalciferol (VITAMIN D3 PO) Take 25 mcg by mouth daily.   clopidogrel  (PLAVIX ) 75 MG tablet Take 1 tablet (75  mg total) by mouth daily at 6 (six) AM.   Cyanocobalamin  (B-12 PO) Take 500 mcg by mouth daily.   ezetimibe  (ZETIA ) 10 MG tablet Take 1 tablet (10 mg total) by mouth daily.   FLUoxetine  (PROZAC ) 20 MG capsule Take 1 capsule (20 mg total) by mouth every morning.   Multiple Vitamins-Minerals (EQ MULTIVITAMINS ADULT GUMMY PO) Take 2 tablets by mouth daily.   hydrOXYzine  (ATARAX ) 25 MG tablet Take 1 tablet (25 mg total) by mouth 3 (three) times daily as needed. (Patient not taking:  Reported on 03/25/2024)   meclizine  (ANTIVERT ) 12.5 MG tablet Take 1 tablet (12.5 mg total) by mouth 3 (three) times daily as needed (vertigo). (Patient not taking: Reported on 03/25/2024)   oxyCODONE -acetaminophen  (PERCOCET/ROXICET) 5-325 MG tablet Take 1-2 tablets by mouth every 4 (four) hours as needed for moderate pain (pain score 4-6). (Patient not taking: Reported on 03/25/2024)   oxyCODONE -acetaminophen  (PERCOCET/ROXICET) 5-325 MG tablet Take 1-2 tablets by mouth every 4 (four) hours as needed for moderate pain (Patient not taking: Reported on 03/25/2024)   triamcinolone  ointment (KENALOG ) 0.5 % Apply 1 Application topically 2 (two) times daily. Use for < 1 week or less. (Patient not taking: Reported on 03/25/2024)   No facility-administered encounter medications on file as of 03/25/2024.    Allergies (verified) Cat dander and Pollen extract   History: Past Medical History:  Diagnosis Date   Acute pain of left foot 02/08/2020   Anxiety    Coronary artery disease    Depression    Dyspnea    Elevated liver enzymes 11/28/2020   High cholesterol    Hypertension    Peripheral vascular disease (HCC)    Past Surgical History:  Procedure Laterality Date   BUNIONECTOMY Bilateral    carpal tunnel repair     COLONOSCOPY WITH PROPOFOL  N/A 08/19/2021   Procedure: COLONOSCOPY WITH PROPOFOL ;  Surgeon: Selena Daily, MD;  Location: [facility] ENDOSCOPY;  Service: Gastroenterology;  Laterality: N/A;   DENTAL SURGERY     ENDARTERECTOMY FEMORAL Bilateral 09/16/2023   Procedure: ENDARTERECTOMY FEMORAL (BILATERAL SFA STENTS);  Surgeon: Jackquelyn Mass, MD;  Location: [facility] ORS;  Service: Vascular;  Laterality: Bilateral;   FOOT SURGERY Right    LOWER EXTREMITY ANGIOGRAPHY Right 08/18/2023   Procedure: Lower Extremity Angiography;  Surgeon: Jackquelyn Mass, MD;  Location: [facility] INVASIVE CV LAB;  Service: Cardiovascular;  Laterality: Right;   TUBAL LIGATION     VAGINAL DELIVERY     x1   Family  History  Problem Relation Age of Onset   Hypertension Mother    Colon cancer Mother 23   Atrial fibrillation Mother    Dementia Mother    Heart disease Father    Diabetes Father    Colon cancer Father 67   Congestive Heart Failure Father    COPD Sister    Crohn's disease Sister    Colon cancer Sister 13   Breast cancer Sister    Heart attack Sister    Heart disease Brother    Hypertension Daughter    Social History   Socioeconomic History   Marital status: Married    Spouse name: Jaleeyah Munce   Number of children: 1   Years of education: Not on file   Highest education level: Not on file  Occupational History    Employer: [employer]  Tobacco Use   Smoking status: Every Day    Current packs/day: 0.50    Average packs/day: 0.5 packs/day for 36.0 years (18.0  ttl pk-yrs)    Types: Cigarettes   Smokeless tobacco: Never   Tobacco comments:    she plans to quit without medication  Vaping Use   Vaping status: Never Used  Substance and Sexual Activity   Alcohol use: Yes    Alcohol/week: 14.0 standard drinks of alcohol    Types: 14 Glasses of wine per week   Drug use: No   Sexual activity: Not Currently    Birth control/protection: None, Post-menopausal  Other Topics Concern   Not on file  Social History Narrative   Works for [employer], retired 06/2016   Married.   Daughter and granddaughter.          Social Drivers of Health   Financial Resource Strain: Patient Declined (03/25/2024)   Overall Financial Resource Strain (CARDIA)    Difficulty of Paying Living Expenses: Patient declined  Food Insecurity: No Food Insecurity (03/25/2024)   Hunger Vital Sign    Worried About Running Out of Food in the Last Year: Never true    Ran Out of Food in the Last Year: Never true  Transportation Needs: No Transportation Needs (03/25/2024)   PRAPARE - Administrator, Civil Service (Medical): No    Lack of Transportation (Non-Medical): No  Physical Activity:  Inactive (03/25/2024)   Exercise Vital Sign    Days of Exercise per Week: 0 days    Minutes of Exercise per Session: 0 min  Stress: No Stress Concern Present (03/25/2024)   [employer] of Occupational Health - Occupational Stress Questionnaire    Feeling of Stress: Only a little  Social Connections: Moderately Integrated (03/25/2024)   Social Connection and Isolation Panel    Frequency of Communication with Friends and Family: More than three times a week    Frequency of Social Gatherings with Friends and Family: Twice a week    Attends Religious Services: More than 4 times per year    Active Member of [employer] or Organizations: No    Attends Engineer, structural: Never    Marital Status: Married    Tobacco Counseling Ready to quit: Not Answered Counseling given: Not Answered Tobacco comments: she plans to quit without medication    Clinical Intake:  Pre-visit preparation completed: Yes  Pain : No/denies pain     BMI - recorded: 24.62 Nutritional Status: BMI of 19-24  Normal Nutritional Risks: None Diabetes: No  Lab Results  Component Value Date   HGBA1C 5.3 01/08/2022   HGBA1C 5.3 10/15/2020   HGBA1C 5.2 11/30/2019     How often do you need to have someone help you when you read instructions, pamphlets, or other written materials from your doctor or pharmacy?: 1 - Never  Interpreter Needed?: No  Information entered by :: Burma Ketcher, LPN   Activities of Daily Living     03/25/2024    8:19 AM 09/18/2023    7:06 PM  In your present state of health, do you have any difficulty performing the following activities:  Hearing? 1 1  Comment wears hearing aids   Vision? 0 1  Difficulty concentrating or making decisions? 0 0  Walking or climbing stairs? 0   Dressing or bathing? 0   Doing errands, shopping? 0 0  Preparing Food and eating ? N   Using the Toilet? N   In the past six months, have you accidently leaked urine? Y   Comment only if she sneezes  or coughs - usually wears lines   Do you have  problems with loss of bowel control? N   Comment unless colitis acts up   Managing your Medications? N   Managing your Finances? N   Housekeeping or managing your Housekeeping? N     Patient Care Team: Calista Catching, FNP as PCP - General (Family Medicine) Jerelene Monday Deadra Everts, MD as PCP - Cardiology (Cardiology) Schnier, Ninette Basque, MD (Vascular Surgery) Dermatology, [place] (Dermatology) Bary Boss., MD (Ophthalmology) Pa, [facility] (Optometry) Valene Gash, NP as Nurse Practitioner (Vascular Surgery)  I have updated your Care Teams any recent Medical Services you may have received from other providers in the past year.     Assessment:   This is a routine wellness examination for Wanda Clayton.  Hearing/Vision screen Hearing Screening - Comments:: Wears bilateral hearing aids - up to date with routine exams with [facility] Vision Screening - Comments:: Wears rx glasses - up to date with routine eye exams with [facility] and [facility]   Goals Addressed               This Visit's Progress     Weight goal 135lb (pt-stated)   Not on track     Stay active Healthy diet       Depression Screen     03/25/2024    8:28 AM 02/24/2024    9:27 AM 11/23/2023   10:26 AM 10/26/2023   10:24 AM 10/22/2023   11:57 AM 08/27/2023    9:29 AM 05/27/2023    9:58 AM  PHQ 2/9 Scores  PHQ - 2 Score 0 0 0 0 0 0 0  PHQ- 9 Score 0      0    Fall Risk     03/25/2024    8:23 AM 02/24/2024    9:27 AM 11/23/2023   10:26 AM 10/26/2023   10:24 AM 10/22/2023   11:57 AM  Fall Risk   Falls in the past year? 0 0 0 0 0  Number falls in past yr: 0 0 0 0 0  Injury with Fall? 0 0 0 0 0  Risk for fall due to : Medication side effect;Other (Comment) No Fall Risks No Fall Risks No Fall Risks No Fall Risks  Risk for fall due to: Comment vertigo      Follow up Falls evaluation completed;Education provided Falls evaluation completed Falls  evaluation completed Falls evaluation completed Falls evaluation completed    MEDICARE RISK AT HOME:  Medicare Risk at Home Any stairs in or around the home?: No If so, are there any without handrails?: No Home free of loose throw rugs in walkways, pet beds, electrical cords, etc?: Yes Adequate lighting in your home to reduce risk of falls?: Yes Life alert?: No Use of a cane, walker or w/c?: No Grab bars in the bathroom?: Yes Shower chair or bench in shower?: Yes Elevated toilet seat or a handicapped toilet?: Yes  TIMED UP AND GO:  Was the test performed?  No  Cognitive Function: 6CIT completed    10/07/2017    8:49 AM  MMSE - Mini Mental State Exam  Orientation to time 5   Orientation to Place 5   Registration 3   Attention/ Calculation 5   Recall 3   Language- name 2 objects 2   Language- repeat 1  Language- follow 3 step command 3   Language- read & follow direction 1   Write a sentence 1   Copy design 1   Total score 30  Data saved with a previous flowsheet row definition        03/25/2024    8:32 AM 03/24/2023    8:47 AM 02/28/2020   10:58 AM 10/11/2018   11:36 AM  6CIT Screen  What Year? 0 points 0 points 0 points 0 points  What month? 0 points 0 points 0 points 0 points  What time? 0 points 0 points 0 points 0 points  Count back from 20 0 points 0 points  0 points  Months in reverse 0 points 0 points  0 points  Repeat phrase 0 points 0 points  0 points  Total Score 0 points 0 points  0 points    Immunizations Immunization History  Administered Date(s) Administered   PFIZER(Purple Top)SARS-COV-2 Vaccination 01/06/2020, 01/27/2020   Td 02/13/2016    Screening Tests Health Maintenance  Topic Date Due   Zoster Vaccines- Shingrix (1 of 2) Never done   COVID-19 Vaccine (3 - 2024-25 season) 06/14/2023   INFLUENZA VACCINE  05/13/2024   DEXA SCAN  11/05/2024   MAMMOGRAM  12/14/2024   Medicare Annual Wellness (AWV)  03/25/2025   DTaP/Tdap/Td (2 -  Tdap) 02/12/2026   Colonoscopy  08/19/2026   Hepatitis C Screening  Completed   HPV VACCINES  Aged Out   Meningococcal B Vaccine  Aged Out   Lung Cancer Screening  Discontinued   Pneumococcal Vaccine: 50+ Years  Discontinued    Health Maintenance  Health Maintenance Due  Topic Date Due   Zoster Vaccines- Shingrix (1 of 2) Never done   COVID-19 Vaccine (3 - 2024-25 season) 06/14/2023   Health Maintenance Items Addressed: Patient declines vaccines.  Additional Screening:  Vision Screening: Recommended annual ophthalmology exams for early detection of glaucoma and other disorders of the eye. Would you like a referral to an eye doctor? No    Dental Screening: Recommended annual dental exams for proper oral hygiene  Community Resource Referral / Chronic Care Management: CRR required this visit?  No   CCM required this visit?  No   Plan:    I have personally reviewed and noted the following in the patient's chart:   Medical and social history Use of alcohol, tobacco or illicit drugs  Current medications and supplements including opioid prescriptions. Patient is not currently taking opioid prescriptions. Functional ability and status Nutritional status Physical activity Advanced directives List of other physicians Hospitalizations, surgeries, and ER visits in previous 12 months Vitals Screenings to include cognitive, depression, and falls Referrals and appointments  In addition, I have reviewed and discussed with patient certain preventive protocols, quality metrics, and best practice recommendations. A written personalized care plan for preventive services as well as general preventive health recommendations were provided to patient.   Azim Gillingham E Rohith Fauth, LPN   1/61/0960   After Visit Summary: (MyChart) Due to this being a telephonic visit, the after visit summary with patients personalized plan was offered to patient via MyChart   Notes: Patient declines vaccines. Up to  date with other screenings. Advised patient to keep an eye on BP and keep f/u appointments.

## 2024-03-25 NOTE — Patient Instructions (Signed)
 Ms. Wanda Clayton , Thank you for taking time out of your busy schedule to complete your Annual Wellness Visit with me. I enjoyed our conversation and look forward to speaking with you again next year. I, as well as your care team,  appreciate your ongoing commitment to your health goals. Please review the following plan we discussed and let me know if I can assist you in the future. Your Game plan/ To Do List    Referrals: no referrals placed today - Keep up with Lung Cancer Screenings, Mammograms, Colonoscopy in 2027, and Bone Density Scans Follow up Visits: Next Medicare AWV with our clinical staff: 03/27/2025 @ 8:50 am    Have you seen your provider in the last 6 months (3 months if uncontrolled diabetes)? Yes Next Office Visit with your provider: 04/06/2024 @ 10:30   Clinician Recommendations:  Aach day, aim for 6 glasses of water, plenty of protein in your diet and try to get up and walk/ stretch every hour for 5-10 minutes at a time.   If you wish to quit smoking, help is available. For free tobacco cessation program offerings call the [facility] at 5678218205 or Live Well Line at (434) 593-5098. You may also visit [email or web address] or email [email or web address] for more information on other programs.   You may also call 1-800-QUIT-NOW (651-478-1799) or visit www.[place].ch or www.[facility]ome[facility].org for additional resources on smoking cessation.        This is a list of the screening recommended for you and due dates:  Health Maintenance  Topic Date Due   Zoster (Shingles) Vaccine (1 of 2) Never done   COVID-19 Vaccine (3 - 2024-25 season) 06/14/2023   Flu Shot  05/13/2024   DEXA scan (bone density measurement)  11/05/2024   Mammogram  12/14/2024   Medicare Annual Wellness Visit  03/25/2025   DTaP/Tdap/Td vaccine (2 - Tdap) 02/12/2026   Colon Cancer Screening  08/19/2026   Hepatitis C Screening  Completed   HPV Vaccine  Aged Out   Meningitis B Vaccine  Aged Out    Screening for Lung Cancer  Discontinued   Pneumococcal Vaccine for age over 16  Discontinued    Advanced directives: (Copy Requested) Please bring a copy of your health care power of attorney and living will to the office to be added to your chart at your convenience. You can mail to [facility] [address] [place], [place] [address] or email to [email or web address] Advance Care Planning is important because it:  [x]  Makes sure you receive the medical care that is consistent with your values, goals, and preferences  [x]  It provides guidance to your family and loved ones and reduces their decisional burden about whether or not they are making the right decisions based on your wishes.  Follow the link provided in your after visit summary or read over the paperwork we have mailed to you to help you started getting your Advance Directives in place. If you need assistance in completing these, please reach out to us  so that we can help you!  See attachments for Preventive Care and Fall Prevention Tips.

## 2024-03-28 ENCOUNTER — Ambulatory Visit: Payer: Self-pay | Admitting: Family

## 2024-03-29 NOTE — Progress Notes (Unsigned)
 Assessment & Plan:  Tachycardia Assessment & Plan: HR 92 today after she rested in the exam room. Reviewed zio monitor results. Rarely symptomatic in regards to palpitations. We discussed beta blocker, such as propranolol  for anxiety and tremor.  She declines beta blocker and referral to pulmonology for OSA evaluation. We agreed to address anxiety first.     Other fatigue Assessment & Plan: Discussed likely multifactorial.  Discussed interrupted sleep, lack of exercise, b12 deficiency.  She politely declines OSA evaluation due to tachycardia, nocturia.  Will monitor.      GAD (generalized anxiety disorder) Assessment & Plan: Chronic, suboptimal control.  Patient prefers to increase Prozac .  Agreed this is very reasonable.  Increase Prozac  to 40 mg daily.  She will let me know how she is doing  Orders: -     FLUoxetine  HCl; Take 1 capsule (40 mg total) by mouth every morning.  Dispense: 90 capsule; Refill: 3  Other orders -     Clopidogrel  Bisulfate; Take 1 tablet (75 mg total) by mouth daily at 6 (six) AM.  Dispense: 90 tablet; Refill: 0     Return precautions given.   Risks, benefits, and alternatives of the medications and treatment plan prescribed today were discussed, and patient expressed understanding.   Education regarding symptom management and diagnosis given to patient on AVS either electronically or printed.  Return in about 6 months (around 10/06/2024).  Rollene Northern, FNP  Subjective:    [name], female    DOB: 11/04/1950, 73 y.o.   MRN: [id number]  CC: Wanda Clayton is a 73 y.o. female who presents today for follow up.   HPI: Accompanied by partner  Complains of increased anxiety.She is interested in increasing prozac  Increased BL hand tremor in the mornings.  Less noticeable throughout the day.  Endorses fatigue.   Denies CP, chills, fever, unusual weight loss  She states very rare palpitations.  Denies falls, loss of balance.    It takes hours to fall asleep. Sleep is interrupted by nocturia.   She is taking nap for 30-45 min afternoon.   She feels restored after 5 consecutive hours over sleep.   She occassional snores.    She gets up at 6am and feeds the dogs.   She is not taking atarax .   No exercise.   She has seen a dermatologist and [facility] dermatology; dx with stasis dermatitis.    Tried propranolol  in 2022  She drinks one cup of coffee per day     Allergies: Cat dander and Pollen extract Current Outpatient Medications on File Prior to Visit  Medication Sig Dispense Refill   acetaminophen  (TYLENOL ) 325 MG tablet Take 1-2 tablets (325-650 mg total) by mouth every 4 (four) hours as needed for mild pain (pain score 1-3) (or temp >/= 101 F).     amLODipine  (NORVASC ) 2.5 MG tablet Take 1 tablet (2.5 mg total) by mouth daily. 90 tablet 3   amLODipine  (NORVASC ) 5 MG tablet Take 1 tablet (5 mg total) by mouth daily as needed. 90 tablet 3   aspirin  81 MG tablet Take 81 mg by mouth daily.     atorvastatin  (LIPITOR) 40 MG tablet Take 1 tablet (40 mg total) by mouth daily at 6 PM. 90 tablet 3   CALCIUM  PO Take 500 mg by mouth daily.     Cholecalciferol (VITAMIN D3 PO) Take 25 mcg by mouth daily.     Cyanocobalamin  (B-12 PO) Take 500 mcg by  mouth daily.     ezetimibe  (ZETIA ) 10 MG tablet Take 1 tablet (10 mg total) by mouth daily. 90 tablet 3   Multiple Vitamins-Minerals (EQ MULTIVITAMINS ADULT GUMMY PO) Take 2 tablets by mouth daily.     meclizine  (ANTIVERT ) 12.5 MG tablet Take 1 tablet (12.5 mg total) by mouth 3 (three) times daily as needed (vertigo). (Patient not taking: Reported on 04/06/2024) 30 tablet 0   No current facility-administered medications on file prior to visit.    Review of Systems  Constitutional:  Positive for fatigue. Negative for chills and fever.  Respiratory:  Negative for cough.   Cardiovascular:  Negative for chest pain and palpitations.  Gastrointestinal:   Negative for nausea and vomiting.  Psychiatric/Behavioral:  Positive for sleep disturbance. The patient is nervous/anxious.       Objective:    BP 132/78   Pulse 92   Temp 98 F (36.7 C) (Oral)   Ht 5' 3 (1.6 m)   Wt 136 lb (61.7 kg)   SpO2 97%   BMI 24.09 kg/m  BP Readings from Last 3 Encounters:  04/06/24 132/78  03/25/24 (!) 155/77  02/26/24 (!) 172/73   Wt Readings from Last 3 Encounters:  04/06/24 136 lb (61.7 kg)  03/25/24 136 lb 9.6 oz (62 kg)  03/25/24 139 lb (63 kg)      04/06/2024   10:09 AM 03/25/2024    8:28 AM 02/24/2024    9:27 AM  Depression screen PHQ 2/9  Decreased Interest 0 0 0  Down, Depressed, Hopeless 0 0 0  PHQ - 2 Score 0 0 0  Altered sleeping 0 0   Tired, decreased energy 0 0   Change in appetite 0 0   Feeling bad or failure about yourself  0 0   Trouble concentrating 0 0   Moving slowly or fidgety/restless 0 0   Suicidal thoughts 0 0   PHQ-9 Score 0 0   Difficult doing work/chores Not difficult at all Not difficult at all       04/06/2024   10:09 AM 05/27/2023    9:58 AM 04/15/2023    9:59 AM 04/01/2023    9:02 AM  GAD 7 : Generalized Anxiety Score  Nervous, Anxious, on Edge 0 0 0 0  Control/stop worrying 0 0 0 0  Worry too much - different things 0 0 0 0  Trouble relaxing 0 0 0 0  Restless 0 0 0 0  Easily annoyed or irritable 0 0 0 0  Afraid - awful might happen 0 0 0 0  Total GAD 7 Score 0 0 0 0  Anxiety Difficulty Not difficult at all Not difficult at all Not difficult at all Not difficult at all      Physical Exam Vitals reviewed.  Constitutional:      Appearance: She is well-developed.   Eyes:     Conjunctiva/sclera: Conjunctivae normal.    Cardiovascular:     Rate and Rhythm: Normal rate and regular rhythm.     Pulses: Normal pulses.     Heart sounds: Normal heart sounds.  Pulmonary:     Effort: Pulmonary effort is normal.     Breath sounds: Normal breath sounds. No wheezing, rhonchi or rales.   Skin:     General: Skin is warm and dry.   Neurological:     Mental Status: She is alert.     Comments: Fine bilateral hand tremor with hands extended. No tremor while hands rested on her legs.  Psychiatric:        Speech: Speech normal.        Behavior: Behavior normal.        Thought Content: Thought content normal.

## 2024-03-30 ENCOUNTER — Other Ambulatory Visit

## 2024-03-30 DIAGNOSIS — I10 Essential (primary) hypertension: Secondary | ICD-10-CM | POA: Diagnosis not present

## 2024-03-30 DIAGNOSIS — I6523 Occlusion and stenosis of bilateral carotid arteries: Secondary | ICD-10-CM | POA: Diagnosis not present

## 2024-03-30 DIAGNOSIS — E538 Deficiency of other specified B group vitamins: Secondary | ICD-10-CM

## 2024-03-30 LAB — COMPREHENSIVE METABOLIC PANEL WITH GFR
ALT: 22 U/L (ref 0–35)
AST: 31 U/L (ref 0–37)
Albumin: 4 g/dL (ref 3.5–5.2)
Alkaline Phosphatase: 120 U/L — ABNORMAL HIGH (ref 39–117)
BUN: 8 mg/dL (ref 6–23)
CO2: 27 meq/L (ref 19–32)
Calcium: 9.5 mg/dL (ref 8.4–10.5)
Chloride: 100 meq/L (ref 96–112)
Creatinine, Ser: 0.85 mg/dL (ref 0.40–1.20)
GFR: 68.19 mL/min (ref >60.00)
Glucose, Bld: 118 mg/dL — ABNORMAL HIGH (ref 70–99)
Potassium: 4.7 meq/L (ref 3.5–5.1)
Sodium: 138 meq/L (ref 135–145)
Total Bilirubin: 0.6 mg/dL (ref 0.2–1.2)
Total Protein: 7.1 g/dL (ref 6.0–8.3)

## 2024-03-30 LAB — CBC WITH DIFFERENTIAL/PLATELET
Basophils Absolute: 0.1 10*3/uL (ref 0.0–0.1)
Basophils Relative: 1 % (ref 0.0–3.0)
Eosinophils Absolute: 0.1 10*3/uL (ref 0.0–0.7)
Eosinophils Relative: 0.7 % (ref 0.0–5.0)
HCT: 37.5 % (ref 36.0–46.0)
Hemoglobin: 12.4 g/dL (ref 12.0–15.0)
Lymphocytes Relative: 24.6 % (ref 12.0–46.0)
Lymphs Abs: 2 10*3/uL (ref 0.7–4.0)
MCHC: 32.9 g/dL (ref 30.0–36.0)
MCV: 93.9 fl (ref 78.0–100.0)
Monocytes Absolute: 0.8 10*3/uL (ref 0.1–1.0)
Monocytes Relative: 9.6 % (ref 3.0–12.0)
Neutro Abs: 5.1 10*3/uL (ref 1.4–7.7)
Neutrophils Relative %: 64.1 % (ref 43.0–77.0)
Platelets: 354 10*3/uL (ref 150.0–400.0)
RBC: 4 Mil/uL (ref 3.87–5.11)
RDW: 15.6 % — ABNORMAL HIGH (ref 11.5–15.5)
WBC: 8 10*3/uL (ref 4.0–10.5)

## 2024-03-30 LAB — LIPID PANEL
Cholesterol: 180 mg/dL (ref 0–200)
HDL: 92.2 mg/dL (ref >39.00)
LDL Cholesterol: 64 mg/dL (ref 0–99)
NonHDL: 87.73
Total CHOL/HDL Ratio: 2
Triglycerides: 117 mg/dL (ref 0.0–149.0)
VLDL: 23.4 mg/dL (ref 0.0–40.0)

## 2024-03-30 LAB — TSH: TSH: 2 u[IU]/mL (ref 0.35–5.50)

## 2024-03-30 LAB — B12 AND FOLATE PANEL
Folate: 10 ng/mL (ref >5.9)
Vitamin B-12: 159 pg/mL — ABNORMAL LOW (ref 211–911)

## 2024-04-01 ENCOUNTER — Ambulatory Visit: Payer: Self-pay | Admitting: Family

## 2024-04-01 DIAGNOSIS — R899 Unspecified abnormal finding in specimens from other organs, systems and tissues: Secondary | ICD-10-CM

## 2024-04-06 ENCOUNTER — Encounter: Payer: Self-pay | Admitting: Family

## 2024-04-06 ENCOUNTER — Other Ambulatory Visit: Payer: Self-pay

## 2024-04-06 ENCOUNTER — Ambulatory Visit (INDEPENDENT_AMBULATORY_CARE_PROVIDER_SITE_OTHER): Admitting: Family

## 2024-04-06 VITALS — BP 132/78 | HR 92 | Temp 98.0°F | Ht 63.0 in | Wt 136.0 lb

## 2024-04-06 DIAGNOSIS — R000 Tachycardia, unspecified: Secondary | ICD-10-CM | POA: Diagnosis not present

## 2024-04-06 DIAGNOSIS — F411 Generalized anxiety disorder: Secondary | ICD-10-CM | POA: Diagnosis not present

## 2024-04-06 DIAGNOSIS — R5383 Other fatigue: Secondary | ICD-10-CM

## 2024-04-06 MED ORDER — ATORVASTATIN CALCIUM 40 MG PO TABS
40.0000 mg | ORAL_TABLET | Freq: Every day | ORAL | 3 refills | Status: DC
  Filled 2024-04-06: qty 100, 100d supply, fill #0
  Filled 2024-08-30: qty 100, 100d supply, fill #1

## 2024-04-06 MED ORDER — FLUOXETINE HCL 40 MG PO CAPS
40.0000 mg | ORAL_CAPSULE | Freq: Every morning | ORAL | 3 refills | Status: DC
  Filled 2024-04-06: qty 90, 90d supply, fill #0
  Filled 2024-06-20: qty 90, 90d supply, fill #1
  Filled 2024-08-30: qty 90, 90d supply, fill #2

## 2024-04-06 MED ORDER — CLOPIDOGREL BISULFATE 75 MG PO TABS
75.0000 mg | ORAL_TABLET | Freq: Every day | ORAL | 0 refills | Status: DC
  Filled 2024-04-11: qty 90, 90d supply, fill #0

## 2024-04-06 NOTE — Assessment & Plan Note (Signed)
 Discussed likely multifactorial.  Discussed interrupted sleep, lack of exercise, b12 deficiency.  She politely declines OSA evaluation due to tachycardia, nocturia.  Will monitor.

## 2024-04-06 NOTE — Assessment & Plan Note (Signed)
 HR 92 today after she rested in the exam room. Reviewed zio monitor results. Rarely symptomatic in regards to palpitations. We discussed beta blocker, such as propranolol  for anxiety and tremor.  She declines beta blocker and referral to pulmonology for OSA evaluation. We agreed to address anxiety first.

## 2024-04-06 NOTE — Patient Instructions (Addendum)
 Increase prozac  to 40mg  every day.   Walk daily  Call to make an appointment for Annual Lung cancer screen  With CT Chest:  [phone]. Let me know if any issues in doing so.

## 2024-04-07 ENCOUNTER — Other Ambulatory Visit: Payer: Self-pay

## 2024-04-07 DIAGNOSIS — I700 Atherosclerosis of aorta: Secondary | ICD-10-CM

## 2024-04-07 DIAGNOSIS — E785 Hyperlipidemia, unspecified: Secondary | ICD-10-CM

## 2024-04-07 MED ORDER — ATORVASTATIN CALCIUM 40 MG PO TABS
40.0000 mg | ORAL_TABLET | Freq: Every day | ORAL | 1 refills | Status: DC
  Filled 2024-04-07: qty 90, 90d supply, fill #0

## 2024-04-07 NOTE — Assessment & Plan Note (Signed)
 Chronic, suboptimal control.  Patient prefers to increase Prozac .  Agreed this is very reasonable.  Increase Prozac  to 40 mg daily.  She will let me know how she is doing

## 2024-04-11 ENCOUNTER — Other Ambulatory Visit: Payer: Self-pay

## 2024-04-11 DIAGNOSIS — I700 Atherosclerosis of aorta: Secondary | ICD-10-CM

## 2024-04-11 DIAGNOSIS — E785 Hyperlipidemia, unspecified: Secondary | ICD-10-CM

## 2024-04-11 MED ORDER — ATORVASTATIN CALCIUM 40 MG PO TABS
40.0000 mg | ORAL_TABLET | Freq: Every day | ORAL | 1 refills | Status: DC
  Filled 2024-04-11 – 2024-06-20 (×3): qty 90, 90d supply, fill #0

## 2024-04-15 ENCOUNTER — Other Ambulatory Visit: Payer: Self-pay | Admitting: Family

## 2024-04-15 DIAGNOSIS — E785 Hyperlipidemia, unspecified: Secondary | ICD-10-CM

## 2024-04-19 ENCOUNTER — Other Ambulatory Visit: Payer: Self-pay

## 2024-04-19 MED ORDER — AMOXICILLIN 500 MG PO CAPS
500.0000 mg | ORAL_CAPSULE | Freq: Three times a day (TID) | ORAL | 0 refills | Status: DC
  Filled 2024-04-19: qty 21, 7d supply, fill #0

## 2024-04-22 ENCOUNTER — Ambulatory Visit (INDEPENDENT_AMBULATORY_CARE_PROVIDER_SITE_OTHER): Admitting: Nurse Practitioner

## 2024-04-22 ENCOUNTER — Encounter (INDEPENDENT_AMBULATORY_CARE_PROVIDER_SITE_OTHER): Payer: Self-pay | Admitting: Nurse Practitioner

## 2024-04-22 VITALS — BP 131/79 | HR 108 | Ht 63.0 in | Wt 136.5 lb

## 2024-04-22 DIAGNOSIS — T8130XA Disruption of wound, unspecified, initial encounter: Secondary | ICD-10-CM

## 2024-04-22 NOTE — Progress Notes (Signed)
 Subjective:    [name], female    DOB: 09/29/1951, 73 y.o.   MRN: [id number] Chief Complaint  Patient presents with   4 week no studies    The patient returns to the office for followup and review status post bilateral femoral endarterectomies with intervention on 09/16/2023.  Will transition her wound dressing to hydrocolloid Band-Aid and it seems to be improving.  She had some significant fibrinous exudate around area and so I did a debridement of some of the area today she tolerated this well.    Review of Systems  Cardiovascular:  Positive for leg swelling.  Skin:  Positive for wound.  All other systems reviewed and are negative.      Objective:   Physical Exam Vitals reviewed.  HENT:     Head: Normocephalic.  Cardiovascular:     Rate and Rhythm: Normal rate.     Pulses:          Dorsalis pedis pulses are detected w/ Doppler on the right side and detected w/ Doppler on the left side.       Posterior tibial pulses are detected w/ Doppler on the right side and detected w/ Doppler on the left side.  Pulmonary:     Effort: Pulmonary effort is normal.  Skin:    General: Skin is warm and dry.  Neurological:     Mental Status: She is alert and oriented to person, place, and time.  Psychiatric:        Mood and Affect: Mood normal.        Behavior: Behavior normal.        Thought Content: Thought content normal.        Judgment: Judgment normal.     BP 131/79   Pulse (!) 108   Ht 5' 3 (1.6 m)   Wt 136 lb 8 oz (61.9 kg)   BMI 24.18 kg/m   Past Medical History:  Diagnosis Date   Acute pain of left foot 02/08/2020   Anxiety    Coronary artery disease    Depression    Dyspnea    Elevated liver enzymes 11/28/2020   High cholesterol    Hypertension    Peripheral vascular disease (HCC)    Smoker 02/21/2018    Social History   Socioeconomic History   Marital status: Married    Spouse name: Albirda Shiel   Number of children: 1   Years of  education: Not on file   Highest education level: Not on file  Occupational History    Employer: [employer]  Tobacco Use   Smoking status: Every Day    Current packs/day: 0.50    Average packs/day: 0.5 packs/day for 36.0 years (18.0 ttl pk-yrs)    Types: Cigarettes   Smokeless tobacco: Never   Tobacco comments:    she plans to quit without medication  Vaping Use   Vaping status: Never Used  Substance and Sexual Activity   Alcohol use: Yes    Alcohol/week: 14.0 standard drinks of alcohol    Types: 14 Glasses of wine per week   Drug use: No   Sexual activity: Not Currently    Birth control/protection: None, Post-menopausal  Other Topics Concern   Not on file  Social History Narrative   Works for [employer], retired 06/2016   Married.   Daughter and granddaughter.          Social Drivers of Corporate investment banker Strain: Patient Declined (  03/25/2024)   Overall Financial Resource Strain (CARDIA)    Difficulty of Paying Living Expenses: Patient declined  Food Insecurity: No Food Insecurity (03/25/2024)   Hunger Vital Sign    Worried About Running Out of Food in the Last Year: Never true    Ran Out of Food in the Last Year: Never true  Transportation Needs: No Transportation Needs (03/25/2024)   PRAPARE - Administrator, Civil Service (Medical): No    Lack of Transportation (Non-Medical): No  Physical Activity: Inactive (03/25/2024)   Exercise Vital Sign    Days of Exercise per Week: 0 days    Minutes of Exercise per Session: 0 min  Stress: No Stress Concern Present (03/25/2024)   [employer] of Occupational Health - Occupational Stress Questionnaire    Feeling of Stress: Only a little  Social Connections: Moderately Integrated (03/25/2024)   Social Connection and Isolation Panel    Frequency of Communication with Friends and Family: More than three times a week    Frequency of Social Gatherings with Friends and Family: Twice a week    Attends  Religious Services: More than 4 times per year    Active Member of [employer] or Organizations: No    Attends Banker Meetings: Never    Marital Status: Married  Catering manager Violence: Not At Risk (03/25/2024)   Humiliation, Afraid, Rape, and Kick questionnaire    Fear of Current or Ex-Partner: No    Emotionally Abused: No    Physically Abused: No    Sexually Abused: No    Past Surgical History:  Procedure Laterality Date   BUNIONECTOMY Bilateral    carpal tunnel repair     COLONOSCOPY WITH PROPOFOL  N/A 08/19/2021   Procedure: COLONOSCOPY WITH PROPOFOL ;  Surgeon: Unk Corinn Skiff, MD;  Location: [facility] ENDOS[facility];  Service: Gastroenterology;  Laterality: N/A;   DENTAL SURGERY     ENDARTERECTOMY FEMORAL Bilateral 09/16/2023   Procedure: ENDARTERECTOMY FEMORAL (BILATERAL SFA STENTS);  Surgeon: Jama Cordella MATSU, MD;  Location: [facility] ORS;  Service: Vascular;  Laterality: Bilateral;   FOOT SURGERY Right    LOWER EXTREMITY ANGIOGRAPHY Right 08/18/2023   Procedure: Lower Extremity Angiography;  Surgeon: Jama Cordella MATSU, MD;  Location: [facility] INVASIVE CV LAB;  Service: Cardiovascular;  Laterality: Right;   TUBAL LIGATION     VAGINAL DELIVERY     x1    Family History  Problem Relation Age of Onset   Hypertension Mother    Colon cancer Mother 28   Atrial fibrillation Mother    Dementia Mother    Heart disease Father    Diabetes Father    Colon cancer Father 43   Congestive Heart Failure Father    COPD Sister    Crohn's disease Sister    Colon cancer Sister 47   Breast cancer Sister    Heart attack Sister    Heart disease Brother    Hypertension Daughter     Allergies  Allergen Reactions   Cat Dander    Pollen Extract Itching       Latest Ref Rng & Units 03/30/2024   10:09 AM 10/03/2023    4:23 PM 09/17/2023   12:01 AM  CBC  WBC 4.0 - 10.5 K/uL 8.0  12.8  9.8   Hemoglobin 12.0 - 15.0 g/dL 87.5  87.5  88.8   Hematocrit 36.0 - 46.0 % 37.5  37.3  32.7    Platelets 150.0 - 400.0 K/uL 354.0  526  178  CMP     Component Value Date/Time   NA 138 03/30/2024 1009   NA 140 04/19/2015 0000   K 4.7 03/30/2024 1009   CL 100 03/30/2024 1009   CO2 27 03/30/2024 1009   GLUCOSE 118 (H) 03/30/2024 1009   BUN 8 03/30/2024 1009   BUN 14 04/19/2015 0000   CREATININE 0.85 03/30/2024 1009   CALCIUM  9.5 03/30/2024 1009   PROT 7.1 03/30/2024 1009   PROT 7.1 02/24/2023 0919   ALBUMIN  4.0 03/30/2024 1009   ALBUMIN  4.4 02/24/2023 0919   AST 31 03/30/2024 1009   ALT 22 03/30/2024 1009   ALKPHOS 120 (H) 03/30/2024 1009   BILITOT 0.6 03/30/2024 1009   BILITOT 0.5 02/24/2023 0919   GFR 68.19 03/30/2024 1009   GFRNONAA >60 10/03/2023 1623     VAS US  ABI WITH/WO TBI Result Date: 03/24/2024  LOWER EXTREMITY DOPPLER STUDY Patient Name:  ARSHI DUARTE [facility]  Date of Exam:   03/23/2024 Medical Rec #: [id number]       Accession #:    [id number] Date of Birth: 27-Oct-1950        Patient Gender: F Patient Age:   37 years Exam Location:  [facility] Procedure:      VAS US  ABI WITH/WO TBI Referring Phys: ORVIN DARING --------------------------------------------------------------------------------  Indications: Peripheral artery disease.  Vascular Interventions: 10/06/23: Bilateral CFA/SFA endarterectomies & bilateral                         SFA stents; Performing Technologist: Elsie Churn RT, RDMS, RVT  Examination Guidelines: A complete evaluation includes at minimum, Doppler waveform signals and systolic blood pressure reading at the level of bilateral brachial, anterior tibial, and posterior tibial arteries, when vessel segments are accessible. Bilateral testing is considered an integral part of a complete examination. Photoelectric Plethysmograph (PPG) waveforms and toe systolic pressure readings are included as required and additional duplex testing as needed. Limited examinations for reoccurring indications may be performed as noted.  ABI Findings:  +---------+------------------+-----+-------------------+-----------------------+ Right    Rt Pressure (mmHg)IndexWaveform           Comment                 +---------+------------------+-----+-------------------+-----------------------+ Brachial 159                                                               +---------+------------------+-----+-------------------+-----------------------+ PTA      111               0.70 dampened monophasicappears to be                                                              collateralization by                                                       duplex                  +---------+------------------+-----+-------------------+-----------------------+  PERO                            biphasic           by duplex               +---------+------------------+-----+-------------------+-----------------------+ DP       126               0.79 monophasic                                 +---------+------------------+-----+-------------------+-----------------------+ Great Toe89                0.56 Abnormal                                   +---------+------------------+-----+-------------------+-----------------------+ +---------+------------------+-----+--------+-------+ Left     Lt Pressure (mmHg)IndexWaveformComment +---------+------------------+-----+--------+-------+ Brachial 101                                    +---------+------------------+-----+--------+-------+ PTA      160               1.01 biphasic        +---------+------------------+-----+--------+-------+ DP       153               0.96 biphasic        +---------+------------------+-----+--------+-------+ Great Toe85                0.53 Abnormal        +---------+------------------+-----+--------+-------+ +-------+-----------+-----------+------------+------------+ ABI/TBIToday's ABIToday's TBIPrevious ABIPrevious TBI  +-------+-----------+-----------+------------+------------+ Right  0.79       0.56       1.19        0.81         +-------+-----------+-----------+------------+------------+ Left   1.01       0.53       1.11        0.74         +-------+-----------+-----------+------------+------------+ Right ABIs appear decreased compared to prior study on 10/16/23. Left ABIs appear essentially unchanged. Known significant difference in the brachial pressures as well as known retrograde left vertebral artery when compared to previous exams.  Summary: Right: Resting right ankle-brachial index indicates moderate right lower extremity arterial disease. The right toe-brachial index is abnormal. Left: Resting left ankle-brachial index is within normal range. The left toe-brachial index is abnormal. *See table(s) above for measurements and observations.  Electronically signed by Cordella Shawl MD on 03/24/2024 at 7:47:00 AM.    Final        Assessment & Plan:   1. Wound dehiscence (Primary) The wound is making progress with healing.  I am hopeful at her next follow-up the wound will be either healed or very new to healed.  She will continue with use of the hydrocolloid Band-Aid dressing.  Will see her return in 4 weeks.    Current Outpatient Medications on File Prior to Visit  Medication Sig Dispense Refill   acetaminophen  (TYLENOL ) 325 MG tablet Take 1-2 tablets (325-650 mg total) by mouth every 4 (four) hours as needed for mild pain (pain score 1-3) (or temp >/= 101 F).     amLODipine  (NORVASC ) 2.5 MG tablet Take 1 tablet (2.5 mg total) by  mouth daily. 90 tablet 3   amLODipine  (NORVASC ) 5 MG tablet Take 1 tablet (5 mg total) by mouth daily as needed. 90 tablet 3   amoxicillin  (AMOXIL ) 500 MG capsule Take 1 capsule (500 mg total) by mouth every 8 (eight) hours until completed. 21 capsule 0   aspirin  81 MG tablet Take 81 mg by mouth daily.     atorvastatin  (LIPITOR) 40 MG tablet Take 1 tablet (40 mg total) by  mouth daily. 100 tablet 3   atorvastatin  (LIPITOR) 40 MG tablet Take 1 tablet (40 mg total) by mouth daily at 6 PM. 90 tablet 1   CALCIUM  PO Take 500 mg by mouth daily.     Cholecalciferol (VITAMIN D3 PO) Take 25 mcg by mouth daily.     clopidogrel  (PLAVIX ) 75 MG tablet Take 1 tablet (75 mg total) by mouth daily at 6 (six) AM. 90 tablet 0   Cyanocobalamin  (B-12 PO) Take 500 mcg by mouth daily.     ezetimibe  (ZETIA ) 10 MG tablet Take 1 tablet (10 mg total) by mouth daily. 90 tablet 3   FLUoxetine  (PROZAC ) 40 MG capsule Take 1 capsule (40 mg total) by mouth every morning. 90 capsule 3   Multiple Vitamins-Minerals (EQ MULTIVITAMINS ADULT GUMMY PO) Take 2 tablets by mouth daily.     meclizine  (ANTIVERT ) 12.5 MG tablet Take 1 tablet (12.5 mg total) by mouth 3 (three) times daily as needed (vertigo). (Patient not taking: Reported on 04/22/2024) 30 tablet 0   No current facility-administered medications on file prior to visit.    There are no Patient Instructions on file for this visit. No follow-ups on file.   Keshauna Degraffenreid E Nyhla Mountjoy, NP

## 2024-04-29 ENCOUNTER — Other Ambulatory Visit (INDEPENDENT_AMBULATORY_CARE_PROVIDER_SITE_OTHER)

## 2024-04-29 DIAGNOSIS — R899 Unspecified abnormal finding in specimens from other organs, systems and tissues: Secondary | ICD-10-CM | POA: Diagnosis not present

## 2024-04-29 LAB — GAMMA GT: GGT: 57 U/L — ABNORMAL HIGH (ref 7–51)

## 2024-05-03 LAB — ALKALINE PHOSPHATASE, ISOENZYMES
Alkaline Phosphatase: 142 IU/L — ABNORMAL HIGH (ref 44–121)
BONE FRACTION: 21 (ref 14–68)
INTESTINAL FRAC.: 0 (ref 0–18)
LIVER FRACTION: 79 (ref 18–85)

## 2024-05-08 ENCOUNTER — Ambulatory Visit: Payer: Self-pay | Admitting: Family

## 2024-05-08 DIAGNOSIS — K760 Fatty (change of) liver, not elsewhere classified: Secondary | ICD-10-CM

## 2024-05-20 ENCOUNTER — Other Ambulatory Visit: Payer: Self-pay

## 2024-05-20 ENCOUNTER — Ambulatory Visit (INDEPENDENT_AMBULATORY_CARE_PROVIDER_SITE_OTHER): Admitting: Nurse Practitioner

## 2024-05-20 ENCOUNTER — Other Ambulatory Visit (INDEPENDENT_AMBULATORY_CARE_PROVIDER_SITE_OTHER): Payer: Self-pay | Admitting: Nurse Practitioner

## 2024-05-20 VITALS — BP 177/72 | HR 128 | Ht 63.0 in | Wt 137.1 lb

## 2024-05-20 DIAGNOSIS — Z7689 Persons encountering health services in other specified circumstances: Secondary | ICD-10-CM | POA: Diagnosis not present

## 2024-05-20 DIAGNOSIS — T8130XA Disruption of wound, unspecified, initial encounter: Secondary | ICD-10-CM

## 2024-05-24 ENCOUNTER — Encounter (INDEPENDENT_AMBULATORY_CARE_PROVIDER_SITE_OTHER): Payer: Self-pay | Admitting: Nurse Practitioner

## 2024-05-24 LAB — AEROBIC CULTURE

## 2024-05-24 NOTE — Progress Notes (Signed)
 Subjective:    [name], female    DOB: 05-19-1951, 73 y.o.   MRN: [id number] Chief Complaint  Patient presents with   Follow-up    No swelling. Leg pain occ    The patient returns to the office for followup and review status post bilateral femoral endarterectomies with intervention on 09/16/2023.  Today the patient's wound does not appear to have healed very much.  She continues to have significant fibrinous exudate in the very small opening.  There does not appear to be any active infection but there is some concern for colonization due to slow wound healing.    Review of Systems  Cardiovascular:  Positive for leg swelling.  Skin:  Positive for wound.  All other systems reviewed and are negative.      Objective:   Physical Exam Vitals reviewed.  HENT:     Head: Normocephalic.  Cardiovascular:     Rate and Rhythm: Normal rate.     Pulses:          Dorsalis pedis pulses are detected w/ Doppler on the right side and detected w/ Doppler on the left side.       Posterior tibial pulses are detected w/ Doppler on the right side and detected w/ Doppler on the left side.  Pulmonary:     Effort: Pulmonary effort is normal.  Skin:    General: Skin is warm and dry.  Neurological:     Mental Status: She is alert and oriented to person, place, and time.  Psychiatric:        Mood and Affect: Mood normal.        Behavior: Behavior normal.        Thought Content: Thought content normal.        Judgment: Judgment normal.     BP (!) 177/72   Pulse (!) 128   Ht 5' 3 (1.6 m)   Wt 137 lb 2 oz (62.2 kg)   BMI 24.29 kg/m   Past Medical History:  Diagnosis Date   Acute pain of left foot 02/08/2020   Anxiety    Coronary artery disease    Depression    Dyspnea    Elevated liver enzymes 11/28/2020   High cholesterol    Hypertension    Peripheral vascular disease (HCC)    Smoker 02/21/2018    Social History   Socioeconomic History   Marital status: Married     Spouse name: Dorrene Bently   Number of children: 1   Years of education: Not on file   Highest education level: Not on file  Occupational History    Employer: [employer]  Tobacco Use   Smoking status: Every Day    Current packs/day: 0.50    Average packs/day: 0.5 packs/day for 36.0 years (18.0 ttl pk-yrs)    Types: Cigarettes   Smokeless tobacco: Never   Tobacco comments:    she plans to quit without medication  Vaping Use   Vaping status: Never Used  Substance and Sexual Activity   Alcohol use: Yes    Alcohol/week: 14.0 standard drinks of alcohol    Types: 14 Glasses of wine per week   Drug use: No   Sexual activity: Not Currently    Birth control/protection: None, Post-menopausal  Other Topics Concern   Not on file  Social History Narrative   Works for [employer], retired 06/2016   Married.   Daughter and granddaughter.  Social Drivers of Health   Financial Resource Strain: Patient Declined (03/25/2024)   Overall Financial Resource Strain (CARDIA)    Difficulty of Paying Living Expenses: Patient declined  Food Insecurity: No Food Insecurity (03/25/2024)   Hunger Vital Sign    Worried About Running Out of Food in the Last Year: Never true    Ran Out of Food in the Last Year: Never true  Transportation Needs: No Transportation Needs (03/25/2024)   PRAPARE - Administrator, Civil Service (Medical): No    Lack of Transportation (Non-Medical): No  Physical Activity: Inactive (03/25/2024)   Exercise Vital Sign    Days of Exercise per Week: 0 days    Minutes of Exercise per Session: 0 min  Stress: No Stress Concern Present (03/25/2024)   [employer] of Occupational Health - Occupational Stress Questionnaire    Feeling of Stress: Only a little  Social Connections: Moderately Integrated (03/25/2024)   Social Connection and Isolation Panel    Frequency of Communication with Friends and Family: More than three times a week    Frequency of Social  Gatherings with Friends and Family: Twice a week    Attends Religious Services: More than 4 times per year    Active Member of [employer] or Organizations: No    Attends Banker Meetings: Never    Marital Status: Married  Catering manager Violence: Not At Risk (03/25/2024)   Humiliation, Afraid, Rape, and Kick questionnaire    Fear of Current or Ex-Partner: No    Emotionally Abused: No    Physically Abused: No    Sexually Abused: No    Past Surgical History:  Procedure Laterality Date   BUNIONECTOMY Bilateral    carpal tunnel repair     COLONOSCOPY WITH PROPOFOL  N/A 08/19/2021   Procedure: COLONOSCOPY WITH PROPOFOL ;  Surgeon: Unk Corinn Skiff, MD;  Location: [facility];  Service: Gastroenterology;  Laterality: N/A;   DENTAL SURGERY     ENDARTERECTOMY FEMORAL Bilateral 09/16/2023   Procedure: ENDARTERECTOMY FEMORAL (BILATERAL SFA STENTS);  Surgeon: Jama Cordella MATSU, MD;  Location: [facility] ORS;  Service: Vascular;  Laterality: Bilateral;   FOOT SURGERY Right    LOWER EXTREMITY ANGIOGRAPHY Right 08/18/2023   Procedure: Lower Extremity Angiography;  Surgeon: Jama Cordella MATSU, MD;  Location: [facility];  Service: Cardiovascular;  Laterality: Right;   TUBAL LIGATION     VAGINAL DELIVERY     x1    Family History  Problem Relation Age of Onset   Hypertension Mother    Colon cancer Mother 32   Atrial fibrillation Mother    Dementia Mother    Heart disease Father    Diabetes Father    Colon cancer Father 81   Congestive Heart Failure Father    COPD Sister    Crohn's disease Sister    Colon cancer Sister 60   Breast cancer Sister    Heart attack Sister    Heart disease Brother    Hypertension Daughter     Allergies  Allergen Reactions   Cat Dander    Pollen Extract Itching       Latest Ref Rng & Units 03/30/2024   10:09 AM 10/03/2023    4:23 PM 09/17/2023   12:01 AM  CBC  WBC 4.0 - 10.5 K/uL 8.0  12.8  9.8   Hemoglobin 12.0 - 15.0 g/dL 87.5   87.5  88.8   Hematocrit 36.0 - 46.0 % 37.5  37.3  32.7   Platelets 150.0 -  400.0 K/uL 354.0  526  178       CMP     Component Value Date/Time   NA 138 03/30/2024 1009   NA 140 04/19/2015 0000   K 4.7 03/30/2024 1009   CL 100 03/30/2024 1009   CO2 27 03/30/2024 1009   GLUCOSE 118 (H) 03/30/2024 1009   BUN 8 03/30/2024 1009   BUN 14 04/19/2015 0000   CREATININE 0.85 03/30/2024 1009   CALCIUM  9.5 03/30/2024 1009   PROT 7.1 03/30/2024 1009   PROT 7.1 02/24/2023 0919   ALBUMIN  4.0 03/30/2024 1009   ALBUMIN  4.4 02/24/2023 0919   AST 31 03/30/2024 1009   ALT 22 03/30/2024 1009   ALKPHOS 142 (H) 04/29/2024 0918   BILITOT 0.6 03/30/2024 1009   BILITOT 0.5 02/24/2023 0919   GFR 68.19 03/30/2024 1009   GFRNONAA >60 10/03/2023 1623     VAS US  ABI WITH/WO TBI Result Date: 03/24/2024  LOWER EXTREMITY DOPPLER STUDY Patient Name:  COBY ANTROBUS [facility]legent [facility]  Date of Exam:   03/23/2024 Medical Rec #: [id number]       Accession #:    [id number] Date of Birth: July 23, 1951        Patient Gender: F Patient Age:   58 years Exam Location:  [facility] Procedure:      VAS US  ABI WITH/WO TBI Referring Phys: ORVIN DARING --------------------------------------------------------------------------------  Indications: Peripheral artery disease.  Vascular Interventions: 10/06/23: Bilateral CFA/SFA endarterectomies & bilateral                         SFA stents; Performing Technologist: Elsie Churn RT, RDMS, RVT  Examination Guidelines: A complete evaluation includes at minimum, Doppler waveform signals and systolic blood pressure reading at the level of bilateral brachial, anterior tibial, and posterior tibial arteries, when vessel segments are accessible. Bilateral testing is considered an integral part of a complete examination. Photoelectric Plethysmograph (PPG) waveforms and toe systolic pressure readings are included as required and additional duplex testing as needed. Limited examinations for reoccurring  indications may be performed as noted.  ABI Findings: +---------+------------------+-----+-------------------+-----------------------+ Right    Rt Pressure (mmHg)IndexWaveform           Comment                 +---------+------------------+-----+-------------------+-----------------------+ Brachial 159                                                               +---------+------------------+-----+-------------------+-----------------------+ PTA      111               0.70 dampened monophasicappears to be                                                              collateralization by  duplex                  +---------+------------------+-----+-------------------+-----------------------+ PERO                            biphasic           by duplex               +---------+------------------+-----+-------------------+-----------------------+ DP       126               0.79 monophasic                                 +---------+------------------+-----+-------------------+-----------------------+ Great Toe89                0.56 Abnormal                                   +---------+------------------+-----+-------------------+-----------------------+ +---------+------------------+-----+--------+-------+ Left     Lt Pressure (mmHg)IndexWaveformComment +---------+------------------+-----+--------+-------+ Brachial 101                                    +---------+------------------+-----+--------+-------+ PTA      160               1.01 biphasic        +---------+------------------+-----+--------+-------+ DP       153               0.96 biphasic        +---------+------------------+-----+--------+-------+ Great Toe85                0.53 Abnormal        +---------+------------------+-----+--------+-------+ +-------+-----------+-----------+------------+------------+ ABI/TBIToday's  ABIToday's TBIPrevious ABIPrevious TBI +-------+-----------+-----------+------------+------------+ Right  0.79       0.56       1.19        0.81         +-------+-----------+-----------+------------+------------+ Left   1.01       0.53       1.11        0.74         +-------+-----------+-----------+------------+------------+ Right ABIs appear decreased compared to prior study on 10/16/23. Left ABIs appear essentially unchanged. Known significant difference in the brachial pressures as well as known retrograde left vertebral artery when compared to previous exams.  Summary: Right: Resting right ankle-brachial index indicates moderate right lower extremity arterial disease. The right toe-brachial index is abnormal. Left: Resting left ankle-brachial index is within normal range. The left toe-brachial index is abnormal. *See table(s) above for measurements and observations.  Electronically signed by Cordella Shawl MD on 03/24/2024 at 7:47:00 AM.    Final        Assessment & Plan:   1. Wound dehiscence (Primary) The wound healing appears to be stagnant.  Because of this we are going to culture the wound today.  She has had success with utilizing Medihoney for debridement and so we will transition back to this dressing to be changed daily.  Will plan to have her return in 4 weeks    Current Outpatient Medications on File Prior to Visit  Medication Sig Dispense Refill   acetaminophen  (TYLENOL ) 325 MG tablet Take 1-2 tablets (325-650 mg total) by mouth every 4 (four) hours as needed for  mild pain (pain score 1-3) (or temp >/= 101 F).     amLODipine  (NORVASC ) 2.5 MG tablet Take 1 tablet (2.5 mg total) by mouth daily. 90 tablet 3   amLODipine  (NORVASC ) 5 MG tablet Take 1 tablet (5 mg total) by mouth daily as needed. 90 tablet 3   amoxicillin  (AMOXIL ) 500 MG capsule Take 1 capsule (500 mg total) by mouth every 8 (eight) hours until completed. 21 capsule 0   aspirin  81 MG tablet Take 81 mg by  mouth daily.     atorvastatin  (LIPITOR) 40 MG tablet Take 1 tablet (40 mg total) by mouth daily. 100 tablet 3   atorvastatin  (LIPITOR) 40 MG tablet Take 1 tablet (40 mg total) by mouth daily at 6 PM. 90 tablet 1   CALCIUM  PO Take 500 mg by mouth daily.     Cholecalciferol (VITAMIN D3 PO) Take 25 mcg by mouth daily.     clopidogrel  (PLAVIX ) 75 MG tablet Take 1 tablet (75 mg total) by mouth daily at 6 (six) AM. 90 tablet 0   Cyanocobalamin  (B-12 PO) Take 500 mcg by mouth daily.     ezetimibe  (ZETIA ) 10 MG tablet Take 1 tablet (10 mg total) by mouth daily. 90 tablet 3   FLUoxetine  (PROZAC ) 40 MG capsule Take 1 capsule (40 mg total) by mouth every morning. 90 capsule 3   meclizine  (ANTIVERT ) 12.5 MG tablet Take 1 tablet (12.5 mg total) by mouth 3 (three) times daily as needed (vertigo). (Patient not taking: Reported on 04/22/2024) 30 tablet 0   Multiple Vitamins-Minerals (EQ MULTIVITAMINS ADULT GUMMY PO) Take 2 tablets by mouth daily.     No current facility-administered medications on file prior to visit.    There are no Patient Instructions on file for this visit. No follow-ups on file.   Jediah Horger E Avyan Livesay, NP

## 2024-06-17 ENCOUNTER — Ambulatory Visit (INDEPENDENT_AMBULATORY_CARE_PROVIDER_SITE_OTHER): Admitting: Nurse Practitioner

## 2024-06-17 ENCOUNTER — Encounter (INDEPENDENT_AMBULATORY_CARE_PROVIDER_SITE_OTHER): Payer: Self-pay | Admitting: Nurse Practitioner

## 2024-06-17 VITALS — BP 155/77 | HR 111 | Ht 63.0 in | Wt 135.6 lb

## 2024-06-17 DIAGNOSIS — T8130XA Disruption of wound, unspecified, initial encounter: Secondary | ICD-10-CM | POA: Diagnosis not present

## 2024-06-19 ENCOUNTER — Encounter (INDEPENDENT_AMBULATORY_CARE_PROVIDER_SITE_OTHER): Payer: Self-pay | Admitting: Nurse Practitioner

## 2024-06-19 NOTE — Progress Notes (Signed)
 Subjective:    [name], female    DOB: Jul 07, 1951, 73 y.o.   MRN: [id number] Chief Complaint  Patient presents with   Follow-up     fu 4 weeks no studies see FB     The patient returns to the office for followup and review status post bilateral femoral endarterectomies with intervention on 09/16/2023.  Today the patient's wound does not appear to have healed very much.  She continues to have significant fibrinous exudate in the very small opening.  There does not appear to be any active infection but there is some concern for colonization due to slow wound healing.    Review of Systems  Cardiovascular:  Positive for leg swelling.  Skin:  Positive for wound.  All other systems reviewed and are negative.      Objective:   Physical Exam Vitals reviewed.  HENT:     Head: Normocephalic.  Cardiovascular:     Rate and Rhythm: Normal rate.     Pulses:          Dorsalis pedis pulses are detected w/ Doppler on the right side and detected w/ Doppler on the left side.       Posterior tibial pulses are detected w/ Doppler on the right side and detected w/ Doppler on the left side.  Pulmonary:     Effort: Pulmonary effort is normal.  Skin:    General: Skin is warm and dry.  Neurological:     Mental Status: She is alert and oriented to person, place, and time.  Psychiatric:        Mood and Affect: Mood normal.        Behavior: Behavior normal.        Thought Content: Thought content normal.        Judgment: Judgment normal.     BP (!) 155/77   Pulse (!) 111   Ht 5' 3 (1.6 m)   Wt 135 lb 9.6 oz (61.5 kg)   BMI 24.02 kg/m   Past Medical History:  Diagnosis Date   Acute pain of left foot 02/08/2020   Anxiety    Coronary artery disease    Depression    Dyspnea    Elevated liver enzymes 11/28/2020   High cholesterol    Hypertension    Peripheral vascular disease (HCC)    Smoker 02/21/2018    Social History   Socioeconomic History   Marital status:  Married    Spouse name: Kassidee Narciso   Number of children: 1   Years of education: Not on file   Highest education level: Not on file  Occupational History    Employer: [employer]  Tobacco Use   Smoking status: Every Day    Current packs/day: 0.50    Average packs/day: 0.5 packs/day for 36.0 years (18.0 ttl pk-yrs)    Types: Cigarettes   Smokeless tobacco: Never   Tobacco comments:    she plans to quit without medication  Vaping Use   Vaping status: Never Used  Substance and Sexual Activity   Alcohol use: Yes    Alcohol/week: 14.0 standard drinks of alcohol    Types: 14 Glasses of wine per week   Drug use: No   Sexual activity: Not Currently    Birth control/protection: None, Post-menopausal  Other Topics Concern   Not on file  Social History Narrative   Works for [employer], retired 06/2016   Married.   Daughter and granddaughter.  Social Drivers of Health   Financial Resource Strain: Patient Declined (03/25/2024)   Overall Financial Resource Strain (CARDIA)    Difficulty of Paying Living Expenses: Patient declined  Food Insecurity: No Food Insecurity (03/25/2024)   Hunger Vital Sign    Worried About Running Out of Food in the Last Year: Never true    Ran Out of Food in the Last Year: Never true  Transportation Needs: No Transportation Needs (03/25/2024)   PRAPARE - Administrator, Civil Service (Medical): No    Lack of Transportation (Non-Medical): No  Physical Activity: Inactive (03/25/2024)   Exercise Vital Sign    Days of Exercise per Week: 0 days    Minutes of Exercise per Session: 0 min  Stress: No Stress Concern Present (03/25/2024)   [employer] of Occupational Health - Occupational Stress Questionnaire    Feeling of Stress: Only a little  Social Connections: Moderately Integrated (03/25/2024)   Social Connection and Isolation Panel    Frequency of Communication with Friends and Family: More than three times a week    Frequency of  Social Gatherings with Friends and Family: Twice a week    Attends Religious Services: More than 4 times per year    Active Member of [employer] or Organizations: No    Attends Banker Meetings: Never    Marital Status: Married  Catering manager Violence: Not At Risk (03/25/2024)   Humiliation, Afraid, Rape, and Kick questionnaire    Fear of Current or Ex-Partner: No    Emotionally Abused: No    Physically Abused: No    Sexually Abused: No    Past Surgical History:  Procedure Laterality Date   BUNIONECTOMY Bilateral    carpal tunnel repair     COLONOSCOPY WITH PROPOFOL  N/A 08/19/2021   Procedure: COLONOSCOPY WITH PROPOFOL ;  Surgeon: Unk Corinn Skiff, MD;  Location: [facility] END[facility];  Service: Gastroenterology;  Laterality: N/A;   DENTAL SURGERY     ENDARTERECTOMY FEMORAL Bilateral 09/16/2023   Procedure: ENDARTERECTOMY FEMORAL (BILATERAL SFA STENTS);  Surgeon: Jama Cordella MATSU, MD;  Location: [facility] ORS;  Service: Vascular;  Laterality: Bilateral;   FOOT SURGERY Right    LOWER EXTREMITY ANGIOGRAPHY Right 08/18/2023   Procedure: Lower Extremity Angiography;  Surgeon: Jama Cordella MATSU, MD;  Location: [facility]IVE [facility];  Service: Cardiovascular;  Laterality: Right;   TUBAL LIGATION     VAGINAL DELIVERY     x1    Family History  Problem Relation Age of Onset   Hypertension Mother    Colon cancer Mother 69   Atrial fibrillation Mother    Dementia Mother    Heart disease Father    Diabetes Father    Colon cancer Father 32   Congestive Heart Failure Father    COPD Sister    Crohn's disease Sister    Colon cancer Sister 69   Breast cancer Sister    Heart attack Sister    Heart disease Brother    Hypertension Daughter     Allergies  Allergen Reactions   Cat Dander    Pollen Extract Itching       Latest Ref Rng & Units 03/30/2024   10:09 AM 10/03/2023    4:23 PM 09/17/2023   12:01 AM  CBC  WBC 4.0 - 10.5 K/uL 8.0  12.8  9.8   Hemoglobin 12.0 - 15.0 g/dL  87.5  87.5  88.8   Hematocrit 36.0 - 46.0 % 37.5  37.3  32.7   Platelets 150.0 -  400.0 K/uL 354.0  526  178       CMP     Component Value Date/Time   NA 138 03/30/2024 1009   NA 140 04/19/2015 0000   K 4.7 03/30/2024 1009   CL 100 03/30/2024 1009   CO2 27 03/30/2024 1009   GLUCOSE 118 (H) 03/30/2024 1009   BUN 8 03/30/2024 1009   BUN 14 04/19/2015 0000   CREATININE 0.85 03/30/2024 1009   CALCIUM  9.5 03/30/2024 1009   PROT 7.1 03/30/2024 1009   PROT 7.1 02/24/2023 0919   ALBUMIN  4.0 03/30/2024 1009   ALBUMIN  4.4 02/24/2023 0919   AST 31 03/30/2024 1009   ALT 22 03/30/2024 1009   ALKPHOS 142 (H) 04/29/2024 0918   BILITOT 0.6 03/30/2024 1009   BILITOT 0.5 02/24/2023 0919   GFR 68.19 03/30/2024 1009   GFRNONAA >60 10/03/2023 1623     VAS US  ABI WITH/WO TBI Result Date: 03/24/2024  LOWER EXTREMITY DOPPLER STUDY Patient Name:  ARIZA EVANS [facility]  Date of Exam:   03/23/2024 Medical Rec #: [id number]       Accession #:    [id number] Date of Birth: 1951/09/18        Patient Gender: F Patient Age:   29 years Exam Location:  [facility] Procedure:      VAS US  ABI WITH/WO TBI Referring Phys: ORVIN DARING --------------------------------------------------------------------------------  Indications: Peripheral artery disease.  Vascular Interventions: 10/06/23: Bilateral CFA/SFA endarterectomies & bilateral                         SFA stents; Performing Technologist: Elsie Churn RT, RDMS, RVT  Examination Guidelines: A complete evaluation includes at minimum, Doppler waveform signals and systolic blood pressure reading at the level of bilateral brachial, anterior tibial, and posterior tibial arteries, when vessel segments are accessible. Bilateral testing is considered an integral part of a complete examination. Photoelectric Plethysmograph (PPG) waveforms and toe systolic pressure readings are included as required and additional duplex testing as needed. Limited examinations for  reoccurring indications may be performed as noted.  ABI Findings: +---------+------------------+-----+-------------------+-----------------------+ Right    Rt Pressure (mmHg)IndexWaveform           Comment                 +---------+------------------+-----+-------------------+-----------------------+ Brachial 159                                                               +---------+------------------+-----+-------------------+-----------------------+ PTA      111               0.70 dampened monophasicappears to be                                                              collateralization by  duplex                  +---------+------------------+-----+-------------------+-----------------------+ PERO                            biphasic           by duplex               +---------+------------------+-----+-------------------+-----------------------+ DP       126               0.79 monophasic                                 +---------+------------------+-----+-------------------+-----------------------+ Great Toe89                0.56 Abnormal                                   +---------+------------------+-----+-------------------+-----------------------+ +---------+------------------+-----+--------+-------+ Left     Lt Pressure (mmHg)IndexWaveformComment +---------+------------------+-----+--------+-------+ Brachial 101                                    +---------+------------------+-----+--------+-------+ PTA      160               1.01 biphasic        +---------+------------------+-----+--------+-------+ DP       153               0.96 biphasic        +---------+------------------+-----+--------+-------+ Great Toe85                0.53 Abnormal        +---------+------------------+-----+--------+-------+ +-------+-----------+-----------+------------+------------+  ABI/TBIToday's ABIToday's TBIPrevious ABIPrevious TBI +-------+-----------+-----------+------------+------------+ Right  0.79       0.56       1.19        0.81         +-------+-----------+-----------+------------+------------+ Left   1.01       0.53       1.11        0.74         +-------+-----------+-----------+------------+------------+ Right ABIs appear decreased compared to prior study on 10/16/23. Left ABIs appear essentially unchanged. Known significant difference in the brachial pressures as well as known retrograde left vertebral artery when compared to previous exams.  Summary: Right: Resting right ankle-brachial index indicates moderate right lower extremity arterial disease. The right toe-brachial index is abnormal. Left: Resting left ankle-brachial index is within normal range. The left toe-brachial index is abnormal. *See table(s) above for measurements and observations.  Electronically signed by Cordella Shawl MD on 03/24/2024 at 7:47:00 AM.    Final        Assessment & Plan:   1. Wound dehiscence (Primary) The wound healing appears to have improved somewhat since she has gone back to utilizing Medih[employer] regularly.  I discussed with the patient that because has been slow to heal a referral to wound care may be helpful but she does not wish to undergo that at this time.  Instead she will continue with use of Medihoney.  Will have her return in the next few weeks with ultrasound in order to evaluate her perfusion as well as wound healing.  However he is advised that if the wound  stops healing or if it begins to worsen she should contact us  as soon as possible.    Current Outpatient Medications on File Prior to Visit  Medication Sig Dispense Refill   acetaminophen  (TYLENOL ) 325 MG tablet Take 1-2 tablets (325-650 mg total) by mouth every 4 (four) hours as needed for mild pain (pain score 1-3) (or temp >/= 101 F).     amLODipine  (NORVASC ) 2.5 MG tablet Take 1 tablet (2.5 mg  total) by mouth daily. 90 tablet 3   amLODipine  (NORVASC ) 5 MG tablet Take 1 tablet (5 mg total) by mouth daily as needed. 90 tablet 3   amoxicillin  (AMOXIL ) 500 MG capsule Take 1 capsule (500 mg total) by mouth every 8 (eight) hours until completed. 21 capsule 0   aspirin  81 MG tablet Take 81 mg by mouth daily.     atorvastatin  (LIPITOR) 40 MG tablet Take 1 tablet (40 mg total) by mouth daily. 100 tablet 3   atorvastatin  (LIPITOR) 40 MG tablet Take 1 tablet (40 mg total) by mouth daily at 6 PM. 90 tablet 1   CALCIUM  PO Take 500 mg by mouth daily.     Cholecalciferol (VITAMIN D3 PO) Take 25 mcg by mouth daily.     clopidogrel  (PLAVIX ) 75 MG tablet Take 1 tablet (75 mg total) by mouth daily at 6 (six) AM. 90 tablet 0   Cyanocobalamin  (B-12 PO) Take 500 mcg by mouth daily.     ezetimibe  (ZETIA ) 10 MG tablet Take 1 tablet (10 mg total) by mouth daily. 90 tablet 3   FLUoxetine  (PROZAC ) 40 MG capsule Take 1 capsule (40 mg total) by mouth every morning. 90 capsule 3   Multiple Vitamins-Minerals (EQ MULTIVITAMINS ADULT GUMMY PO) Take 2 tablets by mouth daily.     meclizine  (ANTIVERT ) 12.5 MG tablet Take 1 tablet (12.5 mg total) by mouth 3 (three) times daily as needed (vertigo). (Patient not taking: Reported on 06/17/2024) 30 tablet 0   No current facility-administered medications on file prior to visit.    There are no Patient Instructions on file for this visit. No follow-ups on file.   Khila Papp E Shanyla Marconi, NP

## 2024-06-20 ENCOUNTER — Other Ambulatory Visit: Payer: Self-pay

## 2024-06-20 ENCOUNTER — Other Ambulatory Visit: Payer: Self-pay | Admitting: Family

## 2024-06-20 DIAGNOSIS — I2510 Atherosclerotic heart disease of native coronary artery without angina pectoris: Secondary | ICD-10-CM

## 2024-06-20 MED FILL — Ezetimibe Tab 10 MG: ORAL | 90 days supply | Qty: 90 | Fill #0 | Status: AC

## 2024-07-13 ENCOUNTER — Other Ambulatory Visit: Payer: Self-pay | Admitting: Family

## 2024-07-13 ENCOUNTER — Other Ambulatory Visit: Payer: Self-pay

## 2024-07-13 MED ORDER — CLOPIDOGREL BISULFATE 75 MG PO TABS
75.0000 mg | ORAL_TABLET | Freq: Every day | ORAL | 0 refills | Status: DC
  Filled 2024-07-13: qty 90, 90d supply, fill #0

## 2024-08-05 ENCOUNTER — Other Ambulatory Visit: Payer: Self-pay

## 2024-08-05 ENCOUNTER — Ambulatory Visit: Payer: Self-pay

## 2024-08-05 DIAGNOSIS — S40811A Abrasion of right upper arm, initial encounter: Secondary | ICD-10-CM | POA: Insufficient documentation

## 2024-08-05 DIAGNOSIS — S62522A Displaced fracture of distal phalanx of left thumb, initial encounter for closed fracture: Secondary | ICD-10-CM | POA: Insufficient documentation

## 2024-08-05 MED ORDER — HYDROCODONE-ACETAMINOPHEN 5-325 MG PO TABS
1.0000 | ORAL_TABLET | Freq: Four times a day (QID) | ORAL | 0 refills | Status: AC | PRN
  Filled 2024-08-05: qty 12, 3d supply, fill #0

## 2024-08-05 NOTE — Telephone Encounter (Signed)
 FYI Only or Action Required?: FYI only for provider.  Patient was last seen in primary care on 04/06/2024 by Dineen Rollene MATSU, FNP.  Called Nurse Triage reporting Dizziness. And abrasion  Symptoms began several months ago.  Interventions attempted: Nothing.  Symptoms are: unchanged.  Triage Disposition: See PCP When Office is Open (Within 3 Days)  Patient/caregiver understands and will follow disposition?: Yes   Copied from CRM [id number]. Topic: Clinical - Red Word Triage >> Aug 05, 2024  2:24 PM Nessti S wrote: [facility] that prompted transfer to Nurse Triage: gets dizzy when she stands up Reason for Disposition  [1] MODERATE dizziness (e.g., interferes with normal activities) AND [2] has been evaluated by doctor (or NP/PA) for this  Answer Assessment - Initial Assessment Questions Tripped and fell in bank today, not related to dizziness, broke left thumb, seen at [facility] and also had wound care for right arm abrasion    1. DESCRIPTION: Describe your dizziness.     Wobbly when standing and has a hard time getting balance 2. LIGHTHEADED: Do you feel lightheaded? (e.g., somewhat faint, woozy, weak upon standing)     denies 3. VERTIGO: Do you feel like either you or the room is spinning or tilting? (i.e., vertigo)     Has been treated for vertigo in the past 4. SEVERITY: How bad is it?  Do you feel like you are going to faint? Can you stand and walk?     Able to walk 5. ONSET:  When did the dizziness begin?     Three months 6. AGGRAVATING FACTORS: Does anything make it worse? (e.g., standing, change in head position)     Bending over, standing  8. CAUSE: What do you think is causing the dizziness? (e.g., decreased fluids or food, diarrhea, emotional distress, heat exposure, new medicine, sudden standing, vomiting; unknown)     unsure 9. RECURRENT SYMPTOM: Have you had dizziness before? If Yes, ask: When was the last time? What happened that time?      Has history of vertigo 10. OTHER SYMPTOMS: Do you have any other symptoms? (e.g., fever, chest pain, vomiting, diarrhea, bleeding)       Reports SOB at times unrelated to dizziness when she is very active  Answer Assessment - Initial Assessment Questions 1. APPEARANCE What does the injury look like?      Abrasion and it looks like raw meat 2. ONSET: How long ago did the injury occur?      today 3. LOCATION: Where is the injury located?      Right arm, entire arm 4. SIZE: How large is the cut?      Entire arm 5. BLEEDING: Is it bleeding now? If Yes, ask: Is it difficult to stop?      Arm bandaged at [facility], no bleeding through bandage 6. PAIN: Is there any pain? If Yes, ask: How bad is the pain? (Scale 0-10; or none, mild, moderate, severe)     C/o pain to both arms elevating 7. MECHANISM: Tell me how it happened.      Fell at the bank today 8. TETANUS: When was your last tetanus booster?     Received tetanus today   Will return to [facility] for dressing change if needed before Monday's appointment  Protocols used: Dizziness - Lightheadedness-A-AH, Skin Injury-A-AH

## 2024-08-05 NOTE — Telephone Encounter (Signed)
 Spoke to pt to see if she was ok with waiting until Monday to be seen, pt stated that she went to [facility] today and they told her that she would probably need her bandages changed next week , also she keeps having dizzy spells. She stated that she would take her pain meds to get her through until appt on Mon with Dr Glendia

## 2024-08-08 ENCOUNTER — Ambulatory Visit (INDEPENDENT_AMBULATORY_CARE_PROVIDER_SITE_OTHER)

## 2024-08-08 ENCOUNTER — Ambulatory Visit: Admitting: Internal Medicine

## 2024-08-08 VITALS — BP 98/60 | HR 90 | Temp 98.5°F | Ht 63.0 in | Wt 140.0 lb

## 2024-08-08 DIAGNOSIS — S62522A Displaced fracture of distal phalanx of left thumb, initial encounter for closed fracture: Secondary | ICD-10-CM

## 2024-08-08 DIAGNOSIS — R251 Tremor, unspecified: Secondary | ICD-10-CM

## 2024-08-08 DIAGNOSIS — S51011S Laceration without foreign body of right elbow, sequela: Secondary | ICD-10-CM | POA: Diagnosis not present

## 2024-08-08 DIAGNOSIS — R296 Repeated falls: Secondary | ICD-10-CM | POA: Diagnosis not present

## 2024-08-08 DIAGNOSIS — R000 Tachycardia, unspecified: Secondary | ICD-10-CM

## 2024-08-08 NOTE — Assessment & Plan Note (Addendum)
 Strength normal, radial pulse normal on right upper arm.  Dressing removed from 08/05/24. New non-adhesive dressing applied. She can remove dressing in 24 hours.  Wound healing well, increased bleeding risk due to aspirin  and Plavix . Apply Vaseline to keep wound moist.Avoid tight bandage to allow breathing. Monitor for infection signs: redness, heat, pus, fever Keep bandage dry for 24 hours. She does have bruising of right upper arm. She is UDT on tetanus.

## 2024-08-08 NOTE — Progress Notes (Signed)
 Acute Office Visit  Subjective:    [name], female    DOB: 1950/11/04, 73 y.o.   MRN: [id number]  Chief Complaint  Patient presents with   Wound Check   Patient is in today for following acute concerns:  HPI Discussed the use of AI scribe software for clinical note transcription with the patient, who gave verbal consent to proceed.  History of Present Illness Wanda Clayton is a 73 year old female who presents with a wound on her right arm following a fall.  Following the fall she was seen at [facility] on 08/05/24: Diagnosed with distal left thumb distal phalanx fracture/dislocation and right arm abrasion, was rec to f/u with hand specialist in 3-5 days, f/u with PCP for wound care of right elbow abrasion.     The fall occurred when she felt dizzy while walking into a bank. She sometimes experiences dizziness due to sinus issues and shakiness attributed to anxiety. She has a history of dizziness and vertigo, for which she has been prescribed meclizine , though she does not take it often. Symptoms of vertigo include the sensation of the room spinning.  She has a history of ulcerative colitis and takes multiple medications including Tylenol  as needed, amlodipine  2.5 mg daily, baby aspirin , Lipitor 40 mg, calcium , vitamin D3, Plavix , Zetia , vitamin B12, and Prozac  40 mg for anxiety. She was also prescribed hydrocodone for pain management after the fracture.  She consumes approximately 14 alcoholic drinks per week, drinking 'pretty much every day,' especially with dinner. She has a history of borderline osteopenia and has gained weight due to decreased mobility. She experiences back and hip pain and is concerned about potential infection of the wound on her right arm.  She has a family history of Parkinson's disease, with her grandfather affected. She experiences shakiness on both sides, which worsens with anxiety and is more pronounced in the morning. She walks slowly and  has had three falls since July 2025, with the first fall occurring in the yard and the most recent at the bank. She uses a walker and a cane at home to aid mobility.   ROS As per HPI    Objective:    BP 98/60 (BP Location: Left Arm, Patient Position: Sitting, Cuff Size: Small)   Pulse 90   Temp 98.5 F (36.9 C) (Oral)   Ht 5' 3 (1.6 m)   Wt 140 lb (63.5 kg)   SpO2 95%   BMI 24.80 kg/m    Physical Exam Constitutional:      General: She is not in acute distress. HENT:     Head: Normocephalic and atraumatic.     Mouth/Throat:     Mouth: Mucous membranes are moist.  Eyes:     Conjunctiva/sclera: Conjunctivae normal.     Pupils: Pupils are equal, round, and reactive to light.  Cardiovascular:     Rate and Rhythm: Normal rate and regular rhythm.  Pulmonary:     Effort: Pulmonary effort is normal.  Abdominal:     Palpations: Abdomen is soft.  Musculoskeletal:     Cervical back: Neck supple.     Right lower leg: No edema.     Left lower leg: No edema.  Lymphadenopathy:     Cervical: No cervical adenopathy.  Neurological:     Mental Status: She is alert and oriented to person, place, and time.     Cranial Nerves: No facial asymmetry.     Gait: Gait abnormal and  tandem walk abnormal.     Comments: Bilateral tremors of hands, fingers worsens with anxiety, pain.  Slow gait     No results found for any visits on 08/08/24.     Assessment & Plan:   Recurrent falls Assessment & Plan: - 3 falls since July 2025. Falls d/d  vertigo, cardiac origin, muscle weakness, alcohol use, dehydration, cerebellar tremor, electrolytes abnormality, anemia. Patient recommended to update labs but she politely decline to get CBC, CMP. Recommend reducing daily alcohol consumption.  - Refer to physical therapy for balance and strength. - Use walker or cane to prevent falls. - Take meclizine  for vertigo as prescribed by PCP. - F/U with PCP in 1 week. Consider neurology evaluation for recurrent  fall and tremor.    Orders: -     Ambulatory referral to Physical Therapy  Displaced fracture of distal phalanx of left thumb, initial encounter for closed fracture Assessment & Plan: Bilateral normal radial pulse.  Left thumb on splint. I reviewed visit note from [facility] from 08/05/24, where it is reported that patient should follow up with hand specialist in 3-5 days. I discussed this with the patient and her husband. Patient has a f/u appointment with orthopedics in 3 weeks, they plan on stopping by [facility] later this morning to assure appropriate follow up.      Laceration of skin of elbow, right, sequela Assessment & Plan: Strength normal, radial pulse normal on right upper arm.  Dressing removed from 08/05/24. New non-adhesive dressing applied. She can remove dressing in 24 hours.  Wound healing well, increased bleeding risk due to aspirin  and Plavix . Apply Vaseline to keep wound moist.Avoid tight bandage to allow breathing. Monitor for infection signs: redness, heat, pus, fever Keep bandage dry for 24 hours. She does have bruising of right upper arm. She is UDT on tetanus.    Tremor Assessment & Plan: Chronic in nature. Worse with anxiety, stress. Family h/o Parkinson in her uncle. D/D includes essential tremor, cerebellar tremor, enhanced psychological tremor, Parkinsonian tremor. Patient declined neurology evaluation at this time and will f/u with her PCP in 1 week.    Tachycardia Assessment & Plan: Chronic, on arrival HR 110/min which came down to 90/min likely suggestive of anxiety, pain related. Had reassuring zio monitoring in the past. No chest pain, palpitations.  Continue f/u with PCP.      Return in about 1 week (around 08/15/2024) for With PCP for recurrent fall, tremers, right arm/elbow abrasion, left thumb fracture .  Luke Shade, MD

## 2024-08-08 NOTE — Assessment & Plan Note (Signed)
 Bilateral normal radial pulse.  Left thumb on splint. I reviewed visit note from [facility] from 08/05/24, where it is reported that patient should follow up with hand specialist in 3-5 days. I discussed this with the patient and her husband. Patient has a f/u appointment with orthopedics in 3 weeks, they plan on stopping by [facility] later this morning to assure appropriate follow up.

## 2024-08-08 NOTE — Assessment & Plan Note (Addendum)
-   3 falls since July 2025. Falls d/d  vertigo, cardiac origin, muscle weakness, alcohol use, dehydration, cerebellar tremor, electrolytes abnormality, anemia. Patient recommended to update labs but she politely decline to get CBC, CMP. Recommend reducing daily alcohol consumption.  - Refer to physical therapy for balance and strength. - Use walker or cane to prevent falls. - Take meclizine  for vertigo as prescribed by PCP. - F/U with PCP in 1 week. Consider neurology evaluation for recurrent fall and tremor.

## 2024-08-08 NOTE — Telephone Encounter (Signed)
 Seen by dr abbey 08/08/24

## 2024-08-08 NOTE — Patient Instructions (Addendum)
-   Please reach out to [facility] on follow up for the left thumb fracture.   - Follow up with PCP/Margaret in 1 week. Consider getting CMP, CBC during follow up visit.   - If you notice sign of infection like worsening redness, heat, yellow thick discharge, fever we need to see you sooner. Avoid getting the bandage wet for the next 24 hours.   - I am referring you to physical therapy to help with strength and reduce risk of fall. Consider neurology evaluation into tremors.

## 2024-08-08 NOTE — Assessment & Plan Note (Signed)
 Chronic in nature. Worse with anxiety, stress. Family h/o Parkinson in her uncle. D/D includes essential tremor, cerebellar tremor, enhanced psychological tremor, Parkinsonian tremor. Patient declined neurology evaluation at this time and will f/u with her PCP in 1 week.

## 2024-08-08 NOTE — Assessment & Plan Note (Signed)
 Chronic, on arrival HR 110/min which came down to 90/min likely suggestive of anxiety, pain related. Had reassuring zio monitoring in the past. No chest pain, palpitations.  Continue f/u with PCP.

## 2024-08-16 ENCOUNTER — Ambulatory Visit: Admitting: Family

## 2024-08-16 ENCOUNTER — Encounter: Payer: Self-pay | Admitting: Family

## 2024-08-16 VITALS — BP 98/60 | HR 101 | Temp 98.4°F | Ht 63.0 in | Wt 139.0 lb

## 2024-08-16 DIAGNOSIS — R42 Dizziness and giddiness: Secondary | ICD-10-CM | POA: Insufficient documentation

## 2024-08-16 DIAGNOSIS — R251 Tremor, unspecified: Secondary | ICD-10-CM

## 2024-08-16 DIAGNOSIS — R296 Repeated falls: Secondary | ICD-10-CM

## 2024-08-16 NOTE — Progress Notes (Signed)
 Assessment & Plan:  Dizziness Assessment & Plan: I suspect etiology of dizziness is multifactorial.  Discussed orthostatic hypotension, and inadequate hydration.  We also discussed alcohol use today.  She adamantly declines labs today. 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  In the interim , I advised patient I will let her know if discontinuing amlodipine  2.5 mg is prudent.  She has follow with vascular next month.  Encouraged compression stockings and/or compression leggings.  Discussed being careful with position changes, avoidance of prolonged exposure to heat.  She will continue physical therapy.  Close follow-up  Sitting Left arm 85/56, 95  right arm 144/69, 96  upon standing  Left arm 82/56 , 106 right arm 126/72, 99  Orders: -     Ambulatory referral to Neurology  Recurrent falls -     Ambulatory referral to Neurology  Tremor Assessment & Plan: Differential includes alcohol use, essential tremor.  Family history of Parkinson's.  Referral to neurology.  Patient politely declines labs today.  Orders: -     Ambulatory referral to Neurology     Return precautions given.   Risks, benefits, and alternatives of the medications and treatment plan prescribed today were discussed, and patient expressed understanding.   Education regarding symptom management and diagnosis given to patient on AVS either electronically or printed.  No follow-ups on file.  Wanda Northern, FNP  Subjective:    [name], female    DOB: 1950/11/24, 73 y.o.   MRN: [id number]  CC: Wanda Clayton is a 73 y.o. female who presents today for follow up.   HPI: HPI Discussed the use of AI scribe software for clinical note transcription with the patient, who gave verbal consent to proceed.  History of Present Illness   Wanda Clayton is a 73 year old female  who presents with dizziness and recent falls.  She has experienced dizziness and falls, beginning on October 10th when a porch swing collapsed, causing her to fall into gravel and sustain bruising. No emergent care was sought. A subsequent fall occurred at a bank when she tripped over a carpet after feeling lightheaded and dizzy. The dizziness is described as lightheadedness rather than vertigo, often occurring upon standing from a sitting or lying position.  Denies chest pain, shortness of breath, syncope  She does follow with [facility] this week  She takes  amlodipine  2.5 mg daily. She experiences dizziness when standing up from bed or the toilet and stands still to regain her balance.She also reports tremors in BL hands, which worsen when holding objects like a coffee cup or pen.  A Zio monitor test in June showed no sustained arrhythmias or atrial fibrillation. She has not had a cardiology follow-up since. She uses a walker but feels unstable with it.  Her social history includes alcohol use, primarily wine, starting before lunch. She drinks one cup of coffee a day and has significantly reduced her caffeine intake. She acknowledges concerns about her alcohol consumption.  She does not think that she suffers from alcohol addiction.  She has never been treated for alcohol use disorder.  Her family history includes Parkinson's disease in her maternal grandfather and severe dementia in her mother.   Seen 08/08/2024 for recurrent falls, displaced fracture left thumb. Seen in [facility] 08/05/2024 ( notes are not in chart), recommended to see hand specialist and  PCP for wound care right elbow. Referred to physical therapy. Daily alcohol use   03/22/2024 ZIO monitor  8 nonsustained SVT, longest 18 beats Rare supraventricular and ventricular ectopy. No sustained arrhythmias. No atrial fibrillation. Last seen cardiology 11//22 for PAD, CAD    Allergies: Cat dander and Pollen  extract Current Outpatient Medications on File Prior to Visit  Medication Sig Dispense Refill   acetaminophen  (TYLENOL ) 325 MG tablet Take 1-2 tablets (325-650 mg total) by mouth every 4 (four) hours as needed for mild pain (pain score 1-3) (or temp >/= 101 F).     amLODipine  (NORVASC ) 2.5 MG tablet Take 1 tablet (2.5 mg total) by mouth daily. 90 tablet 3   amLODipine  (NORVASC ) 5 MG tablet Take 1 tablet (5 mg total) by mouth daily as needed. 90 tablet 3   aspirin  81 MG tablet Take 81 mg by mouth daily.     atorvastatin  (LIPITOR) 40 MG tablet Take 1 tablet (40 mg total) by mouth daily. 100 tablet 3   atorvastatin  (LIPITOR) 40 MG tablet Take 1 tablet (40 mg total) by mouth daily at 6 PM. 90 tablet 1   CALCIUM  PO Take 500 mg by mouth daily.     Cholecalciferol (VITAMIN D3 PO) Take 25 mcg by mouth daily.     clopidogrel  (PLAVIX ) 75 MG tablet Take 1 tablet (75 mg total) by mouth daily at 6 (six) AM. 90 tablet 0   Cyanocobalamin  (B-12 PO) Take 500 mcg by mouth daily.     ezetimibe  (ZETIA ) 10 MG tablet Take 1 tablet (10 mg total) by mouth daily. 90 tablet 3   FLUoxetine  (PROZAC ) 40 MG capsule Take 1 capsule (40 mg total) by mouth every morning. 90 capsule 3   Multiple Vitamins-Minerals (EQ MULTIVITAMINS ADULT GUMMY PO) Take 2 tablets by mouth daily.     hydrOXYzine  (ATARAX ) 25 MG tablet Take 25 mg by mouth 3 (three) times daily as needed.     meclizine  (ANTIVERT ) 12.5 MG tablet Take 1 tablet (12.5 mg total) by mouth 3 (three) times daily as needed (vertigo). (Patient not taking: Reported on 06/17/2024) 30 tablet 0   No current facility-administered medications on file prior to visit.    Review of Systems  Constitutional:  Negative for chills and fever.  Eyes:  Negative for visual disturbance.  Respiratory:  Negative for cough.   Cardiovascular:  Negative for chest pain and palpitations.  Gastrointestinal:  Negative for nausea and vomiting.  Musculoskeletal:  Negative for back pain.  Neurological:   Positive for dizziness and tremors.  Psychiatric/Behavioral:  Negative for suicidal ideas. The patient is not nervous/anxious.       Objective:    BP 98/60   Pulse (!) 101   Temp 98.4 F (36.9 C) (Oral)   Ht 5' 3 (1.6 m)   Wt 139 lb (63 kg)   SpO2 98%   BMI 24.62 kg/m  BP Readings from Last 3 Encounters:  08/16/24 98/60  08/08/24 98/60  06/17/24 (!) 155/77   Wt Readings from Last 3 Encounters:  08/16/24 139 lb (63 kg)  08/08/24 140 lb (63.5 kg)  06/17/24 135 lb 9.6 oz (61.5 kg)    Physical Exam Vitals reviewed.  Constitutional:      Appearance: She is well-developed.  Eyes:     Conjunctiva/sclera: Conjunctivae normal.  Cardiovascular:     Rate and Rhythm: Normal rate and regular rhythm.     Pulses: Normal pulses.     Heart sounds: Normal heart sounds.  Pulmonary:  Effort: Pulmonary effort is normal.     Breath sounds: Normal breath sounds. No wheezing, rhonchi or rales.  Skin:    General: Skin is warm and dry.  Neurological:     Mental Status: She is alert.     Comments: Tremor with hands extended bilaterally.  No tremor in hands when resting on her lap .  No cogwheeling.  Psychiatric:        Speech: Speech normal.        Behavior: Behavior normal.        Thought Content: Thought content normal.

## 2024-08-16 NOTE — Assessment & Plan Note (Addendum)
 I suspect etiology of dizziness is multifactorial.  Discussed orthostatic hypotension, and inadequate hydration.  We also discussed alcohol use today.  She adamantly declines labs today. 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  In the interim , I advised patient I will let her know if discontinuing amlodipine  2.5 mg is prudent.  She has follow with vascular next month.  Encouraged compression stockings and/or compression leggings.  Discussed being careful with position changes, avoidance of prolonged exposure to heat.  She will continue physical therapy.  Close follow-up  Sitting Left arm 85/56, 95  right arm 144/69, 96  upon standing  Left arm 82/56 , 106 right arm 126/72, 99

## 2024-08-16 NOTE — Patient Instructions (Signed)
 I reached out to vascular in regards to the significant discrepancy in blood pressure in  right and left arm.  I have concern for arterial stenosis would likely require further ultrasound at to investigate.  OI have asked vascular if holding amlodipine  2.5 is reasonable.  I will get back to you in this regard.    Please purchase compression leggings or be consistent with compression stockings.  Referral placed to neurology.  Please let me know if any new symptoms or concerns

## 2024-08-16 NOTE — Assessment & Plan Note (Signed)
 Differential includes alcohol use, essential tremor.  Family history of Parkinson's.  Referral to neurology.  Patient politely declines labs today.

## 2024-08-23 ENCOUNTER — Other Ambulatory Visit: Payer: Self-pay

## 2024-08-23 MED ORDER — AMOXICILLIN-POT CLAVULANATE 875-125 MG PO TABS
1.0000 | ORAL_TABLET | Freq: Two times a day (BID) | ORAL | 0 refills | Status: DC
  Filled 2024-08-23: qty 10, 5d supply, fill #0

## 2024-08-30 MED FILL — Ezetimibe Tab 10 MG: ORAL | 90 days supply | Qty: 90 | Fill #1 | Status: AC

## 2024-08-31 ENCOUNTER — Other Ambulatory Visit: Payer: Self-pay

## 2024-09-01 ENCOUNTER — Other Ambulatory Visit: Payer: Self-pay

## 2024-09-01 ENCOUNTER — Ambulatory Visit

## 2024-09-01 VITALS — BP 140/96 | HR 96

## 2024-09-01 DIAGNOSIS — R296 Repeated falls: Secondary | ICD-10-CM | POA: Diagnosis present

## 2024-09-01 NOTE — Therapy (Addendum)
 OUTPATIENT PHYSICAL THERAPY NEURO EVALUATION   Patient Name: Wanda Clayton MRN: [id number] DOB:Oct 23, 1950, 73 y.o., female Today's Date: 09/01/2024   PCP: Dineen Rollene MATSU, FNP  REFERRING PROVIDER:  Abbey Bruckner, MD        END OF SESSION:  PT End of Session - 09/01/24 1028     Visit Number 1    Number of Visits 25    Date for Recertification  11/24/24    PT Start Time 0930    PT Stop Time 1016    PT Time Calculation (min) 46 min    Equipment Utilized During Treatment Gait belt    Activity Tolerance Patient tolerated treatment well;No increased pain    Behavior During Therapy [facility] for tasks assessed/performed          Past Medical History:  Diagnosis Date   Acute pain of left foot 02/08/2020   Anxiety    Coronary artery disease    Depression    Dyspnea    Elevated liver enzymes 11/28/2020   High cholesterol    Hypertension    Peripheral vascular disease    Smoker 02/21/2018   Past Surgical History:  Procedure Laterality Date   BUNIONECTOMY Bilateral    carpal tunnel repair     COLONOSCOPY WITH PROPOFOL  N/A 08/19/2021   Procedure: COLONOSCOPY WITH PROPOFOL ;  Surgeon: Unk Corinn Skiff, MD;  Location: [facility] ENDOSCOPY;  Service: Gastroenterology;  Laterality: N/A;   DENTAL SURGERY     ENDARTERECTOMY FEMORAL Bilateral 09/16/2023   Procedure: ENDARTERECTOMY FEMORAL (BILATERAL SFA STENTS);  Surgeon: Jama Cordella MATSU, MD;  Location: [facility] ORS;  Service: Vascular;  Laterality: Bilateral;   FOOT SURGERY Right    LOWER EXTREMITY ANGIOGRAPHY Right 08/18/2023   Procedure: Lower Extremity Angiography;  Surgeon: Jama Cordella MATSU, MD;  Location: [facility] INVASIVE CV LAB;  Service: Cardiovascular;  Laterality: Right;   TUBAL LIGATION     VAGINAL DELIVERY     x1   Patient Active Problem List   Diagnosis Date Noted   Dizziness 08/16/2024   Recurrent falls 08/08/2024   Laceration of skin of elbow, right, sequela 08/08/2024   Displaced fracture of distal phalanx of  left thumb, initial encounter for closed fracture 08/05/2024   Abrasion of right upper arm 08/05/2024   Tachycardia 02/24/2024   Rash 11/23/2023   Lung nodule 10/22/2023   Wound dehiscence 10/22/2023   Atherosclerosis of artery of extremity with rest pain (HCC) 09/16/2023   B12 deficiency 09/01/2023   Atherosclerosis of native arteries of extremity with rest pain (HCC) 09/01/2023   Elevated MCV 05/27/2023   Chronic cerebral ischemia 05/10/2023   Discoloration of skin of lower leg 04/15/2023   Fatigue 04/01/2023   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   Hepatic steatosis 07/28/2022   Blood in stool    Polyp of ascending colon    Elevated liver enzymes 11/28/2020   Closed fracture of fifth metatarsal bone of left foot 02/08/2020   Carotid atherosclerosis, bilateral 01/11/2020   Trouble swallowing 11/30/2019   Family history of colon cancer 09/01/2018   Coronary artery calcification seen on CT scan 02/22/2018   Claudication in peripheral vascular disease 02/22/2018   Smoker 02/21/2018   Vertigo 01/06/2018   Atherosclerosis of aorta 11/26/2017   Hardening of the aorta (main artery of the heart) 11/26/2017   Osteopenia determined by x-ray 09/16/2017   Colitis, collagenous 05/22/2016   Chronic diarrhea 12/18/2015   Tremor 12/18/2015   Noninfective gastroenteritis and colitis 12/18/2015   Hearing loss 06/20/2015   Hyperlipidemia  06/20/2015   Routine general medical examination at a health care facility 06/01/2014   Ganglion cyst of wrist 02/02/2013   HTN (hypertension) 01/19/2012   Elevated blood-pressure reading without diagnosis of hypertension 01/19/2012   Atrophic vaginitis 12/11/2011   GAD (generalized anxiety disorder) 12/11/2011   Dysthymic disorder 12/11/2011    ONSET DATE: 08/05/2024  REFERRING DIAG: R29.6 (ICD-10-CM) - Recurrent falls   THERAPY DIAG:  Repeated falls  Rationale for Evaluation and Treatment: Rehabilitation  SUBJECTIVE:                                                                                                                                                                                              SUBJECTIVE STATEMENT: Pt. Reports that she recently fell on Oct. 24th when tripping over a piece of carpet at the bank. Patient reports she has intermittently uses SPC at home and in community since the incident. She reports having decreased confidence in her balance as well. She reports she has 2 wheeled walker at home, but does not use it. Patient reports she has come close to other falls, but has not had any others. Patient reports having intermittent bouts of dizziness and does take BP medication due to HTN. Patient lives with husband and 4 small dogs. Pt. Has 3 steps with 2 rails on back porch with level entry at front of home. Patient reports having difficulty with stairs at home. Patient has not driven in 6 weeks. She does report having difficulty with ADLs/household chores requiring increased rest breaks due to fatigue and weakness. Patient reports having L hip pain that is sharp in nature and increases upon standing. She has not had any imaging of the L hip.  Pt accompanied by: self  PERTINENT HISTORY: Recent falls   PAIN:  Are you having pain? Yes: NPRS scale: 0 Pain location: L hip Pain description: Sharp   Aggravating factors: Standing up Relieving factors: Positional changes/ Tylenol    PRECAUTIONS: Fall  RED FLAGS: None   WEIGHT BEARING RESTRICTIONS: No  FALLS: Has patient fallen in last 6 months? Yes. Number of falls 1  LIVING ENVIRONMENT: Lives with: lives with their spouse Lives in: House/apartment Stairs: Yes: External: 3 steps; on right going up and on left going up on back porch. Level entry from front of home. Has following equipment at home: Single point cane and Walker - 2 wheeled  PLOF: Independent with ADLs/household chores   PATIENT GOALS: Patient reports her goals include improved balance with walking and  to be able to walk dogs with decreased risk of falls. She also wishes to walk without AD eventually.  OBJECTIVE:  Note: Objective measures were completed at Evaluation unless otherwise noted.  DIAGNOSTIC FINDINGS: None BP: 140/96 HR: 96 COGNITION: Overall cognitive status: Within functional limits for tasks assessed   SENSATION: Patient reports her toes are numb.   COORDINATION: Mild coordination deficits noted throughout treatment with gait/transfers.  EDEMA:  Not tested  MUSCLE TONE: Not tested  MUSCLE LENGTH: Not tested   DTRs:  Not tested  POSTURE: rounded shoulders and forward head  LOWER EXTREMITY ROM:    WFL bilaterally.   LOWER EXTREMITY MMT:    MMT Right Eval Left Eval  Hip flexion 4 4  Hip extension    Hip abduction 4 4-  Hip adduction    Hip internal rotation 4+ 4+  Hip external rotation 4 4  Knee flexion 4 4  Knee extension 5 5  Ankle dorsiflexion 5 5  Ankle plantarflexion    Ankle inversion    Ankle eversion    (Blank rows = not tested)  BED MOBILITY:  Findings: Independent  TRANSFERS: Independent, uses upper extremities with sit to stand and stand pivot transfers.  RAMP:  Not tested  CURB:  Not tested  STAIRS: Not tested, reports step-to pattern with 2 feet on each step while using bilateral railing. GAIT: Findings: Gait Characteristics: decreased stance time- Left, decreased stride length, and antalgic, Distance walked: 50', Assistive device utilized:Single point cane, and Level of assistance: SBA  BALANCE TESTS:  Romberg: WFL Romberg w/ EC: slight increase in sway.  Tandem Stance: Able to maintain balance for 2'' prior to using UE support to regain balance.  FUNCTIONAL TESTS:  5 times sit to stand: 23.2'' (modified with UE use). Timed up and go (TUG): 24.69 with SPC 6 minute walk test: Test at next treatment session. 10 meter walk test: 16''; 0.63 m/s with SPC. BERG: Test at next treatment session.  PATIENT SURVEYS:   LEFS  Extreme difficulty/unable (0), Quite a bit of difficulty (1), Moderate difficulty (2), Little difficulty (3), No difficulty (4) Survey date:    Any of your usual work, housework or school activities 1  2. Usual hobbies, recreational or sporting activities 0  3. Getting into/out of the bath 0  4. Walking between rooms 3  5. Putting on socks/shoes 3  6. Squatting  1  7. Lifting an object, like a bag of groceries from the floor 2  8. Performing light activities around your home 2  9. Performing heavy activities around your home 1  10. Getting into/out of a car 3  11. Walking 2 blocks 0  12. Walking 1 mile 0  13. Going up/down 10 stairs (1 flight) 0  14. Standing for 1 hour 0  15.  sitting for 1 hour 0  16. Running on even ground 0  17. Running on uneven ground 0  18. Making sharp turns while running fast 0  19. Hopping  0  20. Rolling over in bed 3  Score total:  20/80  EVALUATION DATE: 09/01/2024    PATIENT EDUCATION: Education details: Patient educated on proper exercise mechanics and techniques to improve LE strength. She was also educated on proper techniques for STS transfers for improved safety/effeciency. Person educated: Patient Education method: Explanation, Demonstration, Verbal cues, and Handouts Education comprehension: verbalized understanding and returned demonstration  HOME EXERCISE PROGRAM: Access Code: [email or web address] URL: [email or web address] Date: 09/01/2024 Prepared by: Sidra Simpers  Exercises - Seated Hip Abduction with Resistance  - 1 x daily - 7 x weekly - 3 sets - 10 reps - Sit to Stand with Arms Crossed  - 1 x daily - 7 x weekly - 3 sets - 10 reps - Seated March  - 1 x daily - 7 x weekly - 3 sets - 10 reps  GOALS: Goals reviewed with patient? Yes  SHORT TERM GOALS: Target date: 10/13/2024   1.  Patient (>  79 years old) will complete five times sit to stand test in < 18 seconds indicating an increased LE strength and improved balance. Baseline: 64'' with UE use Goal status: INITIAL  2.  Patient will increase Berg Balance score by > 4 points to demonstrate decreased fall risk during functional activities. Baseline: Not tested at evaluation. Goal status: INITIAL   3.  Patient will reduce timed up and go to <18 seconds to reduce fall risk and demonstrate improved transfer/gait ability. Baseline: 24.6'' with SPC Goal status: INITIAL  4.  Patient will demonstrate independence with current HEP showing proper mechanics/positioning with exercises allowing her to perform HEP at home with improved safety.   BASELINE: HEP initiated at evaluation with handout/education.  Goal status: INITIAL      LONG TERM GOALS: Target date: 11/24/2024  1.  Patient (> 42 years old) will complete five times sit to stand test in < 15 seconds indicating an increased LE strength and improved balance. Baseline: 66'' with UE use Goal status: INITIAL  2.  Patient will increase Berg Balance score by > 6 points to demonstrate decreased fall risk during functional activities. Baseline: Not assessed at evaluation. Goal status: INITIAL   3.  Patient will reduce timed up and go to <13 seconds to reduce fall risk and demonstrate improved transfer/gait ability. Baseline: 24.6 with SPC Goal status: INITIAL  4.  Patient will demonstrate tandem stance for 15'' without UE for balance recovery by discharge allowing her decreased risk for falls. 0 Baseline: Patient able to perform tandem stance for 2'' prior to LOB and need for UE support. Goal status: INITIAL  5.  Patient will increase six minute walk test distance to >1000 for progression to community ambulator and improve gait ability Baseline: Not tested at evaluation. Goal status: INITIAL    ASSESSMENT:  CLINICAL IMPRESSION: Patient is a 73 y.o. female who was seen  today for physical therapy evaluation and treatment for recurrent falls. Patient reported falling in October of this year resulting in a fractured L thumb, L hip pain and decreased confidence in her balance. She has since used [facility] for ambulation. Upon examination, patient was noted to have decreased LE strength with MMT, decreased independence with gait, decreased gait speed, gait abnormalities, and decreased static/dynamic balance. Patient may benefit from skilled PT treatments 2x/week for 12 weeks to improve on deficits listed above.  OBJECTIVE IMPAIRMENTS: Abnormal gait, decreased activity tolerance, decreased balance, decreased coordination, decreased endurance, decreased mobility, difficulty walking, decreased strength, and pain.   ACTIVITY LIMITATIONS: walking, standing, squatting, stairs, and transfers   PARTICIPATION LIMITATIONS: meal prep, cleaning, laundry, driving,  and community activity  PERSONAL FACTORS: Age, Past/current experiences, and 1-2 comorbidities: CAD, HTN, PAD are also affecting patient's functional outcome.   REHAB POTENTIAL: Good  CLINICAL DECISION MAKING: Stable/uncomplicated  EVALUATION COMPLEXITY: Moderate  PLAN:  PT FREQUENCY: 1-2x/week  PT DURATION: 12 weeks  PLANNED INTERVENTIONS: [id number]- PT Re-evaluation, 97750- Physical Performance Testing, 97110-Therapeutic exercises, 97530- Therapeutic activity, [id number]- Neuromuscular re-education, 97535- Self Care, [id number]- Manual therapy, [phone]- Gait training, Patient/Family education, Balance training, Vestibular training, DME instructions, Cryotherapy, and Moist heat  PLAN FOR NEXT SESSION:  Continue with gait/balance assessments including and BERG balance test. Continue working to improve LE strength and balance with exercises and balance activities.   Norman Sharps, PT, DPT  09/01/2024, 11:32 AM

## 2024-09-07 ENCOUNTER — Ambulatory Visit: Admitting: Physical Therapy

## 2024-09-07 ENCOUNTER — Encounter: Payer: Self-pay | Admitting: Physical Therapy

## 2024-09-07 ENCOUNTER — Other Ambulatory Visit (INDEPENDENT_AMBULATORY_CARE_PROVIDER_SITE_OTHER): Payer: Self-pay | Admitting: Nurse Practitioner

## 2024-09-07 DIAGNOSIS — R296 Repeated falls: Secondary | ICD-10-CM | POA: Diagnosis not present

## 2024-09-07 DIAGNOSIS — I739 Peripheral vascular disease, unspecified: Secondary | ICD-10-CM

## 2024-09-07 DIAGNOSIS — R42 Dizziness and giddiness: Secondary | ICD-10-CM

## 2024-09-07 NOTE — Therapy (Signed)
 [facility]   Patient Name: Wanda Clayton MRN: [id number] DOB:[birth year], 73 y.o., female Today's Date: 09/07/2024   PCP: Dineen Rollene MATSU, FNP  REFERRING PROVIDER:  Abbey Bruckner, MD        END OF SESSION:  PT End of Session - 09/07/24 1108     Visit Number 2    Number of Visits 25    Date for Recertification  11/24/24    PT Start Time 1107    PT Stop Time 1147    PT Time Calculation (min) 40 min    Equipment Utilized During Treatment Gait belt    Activity Tolerance Patient tolerated treatment well;Patient limited by fatigue    Behavior During Therapy [facility] for tasks assessed/performed          Past Medical History:  Diagnosis Date   Acute pain of left foot 02/08/2020   Anxiety    Coronary artery disease    Depression    Dyspnea    Elevated liver enzymes 11/28/2020   High cholesterol    Hypertension    Peripheral vascular disease    Smoker 02/21/2018   Past Surgical History:  Procedure Laterality Date   BUNIONECTOMY Bilateral    carpal tunnel repair     COLONOSCOPY WITH PROPOFOL  N/A 08/19/2021   Procedure: COLONOSCOPY WITH PROPOFOL ;  Surgeon: Unk Corinn Skiff, MD;  Location: [facility];  Service: Gastroenterology;  Laterality: N/A;   DENTAL SURGERY     ENDARTERECTOMY FEMORAL Bilateral 09/16/2023   Procedure: ENDARTERECTOMY FEMORAL (BILATERAL SFA STENTS);  Surgeon: Jama Cordella MATSU, MD;  Location: [facility];  Service: Vascular;  Laterality: Bilateral;   FOOT SURGERY Right    LOWER EXTREMITY ANGIOGRAPHY Right 08/18/2023   Procedure: Lower Extremity Angiography;  Surgeon: Jama Cordella MATSU, MD;  Location: [facility];  Service: Cardiovascular;  Laterality: Right;   TUBAL LIGATION     VAGINAL DELIVERY     x1   Patient Active Problem List   Diagnosis Date Noted   Dizziness 08/16/2024   Recurrent falls 08/08/2024   Laceration of skin of elbow, right, sequela 08/08/2024   Displaced fracture of distal phalanx of  left thumb, initial encounter for closed fracture 08/05/2024   Abrasion of right upper arm 08/05/2024   Tachycardia 02/24/2024   Rash 11/23/2023   Lung nodule 10/22/2023   Wound dehiscence 10/22/2023   Atherosclerosis of artery of extremity with rest pain (HCC) 09/16/2023   B12 deficiency 09/01/2023   Atherosclerosis of native arteries of extremity with rest pain (HCC) 09/01/2023   Elevated MCV 05/27/2023   Chronic cerebral ischemia 05/10/2023   Discoloration of skin of lower leg 04/15/2023   Fatigue 04/01/2023   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   Hepatic steatosis 07/28/2022   Blood in stool    Polyp of ascending colon    Elevated liver enzymes 11/28/2020   Closed fracture of fifth metatarsal bone of left foot 02/08/2020   Carotid atherosclerosis, bilateral 01/11/2020   Trouble swallowing 11/30/2019   Family history of colon cancer 09/01/2018   Coronary artery calcification seen on CT scan 02/22/2018   Claudication in peripheral vascular disease 02/22/2018   Smoker 02/21/2018   Vertigo 01/06/2018   Atherosclerosis of aorta 11/26/2017   Hardening of the aorta (main artery of the heart) 11/26/2017   Osteopenia determined by x-ray 09/16/2017   Colitis, collagenous 05/22/2016   Chronic diarrhea 12/18/2015   Tremor 12/18/2015   Noninfective gastroenteritis and colitis 12/18/2015   Hearing loss 06/20/2015  Hyperlipidemia 06/20/2015   Routine general medical examination at a health care facility 06/01/2014   Ganglion cyst of wrist 02/02/2013   HTN (hypertension) 01/19/2012   Elevated blood-pressure reading without diagnosis of hypertension 01/19/2012   Atrophic vaginitis 12/11/2011   GAD (generalized anxiety disorder) 12/11/2011   Dysthymic disorder 12/11/2011    ONSET DATE: 08/05/2024  REFERRING DIAG: R29.6 (ICD-10-CM) - Recurrent falls   THERAPY DIAG:  Repeated falls  Rationale for Evaluation and Treatment: Rehabilitation  SUBJECTIVE:                                                                                                                                                                                              SUBJECTIVE STATEMENT: Pt reported feeling more confident today in today's session. Says HEP has been going well. Reports bringing cane today due to distance to rehab gym from hospital entrance. She reports feeling motivated today to get it done. Pt accompanied by: self  PERTINENT HISTORY: Recent falls   PAIN:  Are you having pain? Yes: NPRS scale: 0 Pain location: L hip Pain description: Sharp   Aggravating factors: Standing up Relieving factors: Positional changes/ Tylenol    PRECAUTIONS: Fall  RED FLAGS: None   WEIGHT BEARING RESTRICTIONS: No  FALLS: Has patient fallen in last 6 months? Yes. Number of falls 1  LIVING ENVIRONMENT: Lives with: lives with their spouse Lives in: House/apartment Stairs: Yes: External: 3 steps; on right going up and on left going up on back porch. Level entry from front of home. Has following equipment at home: Single point cane and Walker - 2 wheeled  PLOF: Independent with ADLs/household chores   PATIENT GOALS: Patient reports her goals include improved balance with walking and to be able to walk dogs with decreased risk of falls. She also wishes to walk without AD eventually.  OBJECTIVE:  Note: Objective measures were completed at Evaluation unless otherwise noted.  DIAGNOSTIC FINDINGS: None BP: 140/96 HR: 96 COGNITION: Overall cognitive status: Within functional limits for tasks assessed   SENSATION: Patient reports her toes are numb.   COORDINATION: Mild coordination deficits noted throughout treatment with gait/transfers.  EDEMA:  Not tested  MUSCLE TONE: Not tested  MUSCLE LENGTH: Not tested   DTRs:  Not tested  POSTURE: rounded shoulders and forward head  LOWER EXTREMITY ROM:    WFL bilaterally.   LOWER EXTREMITY MMT:    MMT Right Eval Left Eval  Hip  flexion 4 4  Hip extension    Hip abduction 4 4-  Hip adduction    Hip internal rotation 4+ 4+  Hip external rotation 4 4  Knee flexion 4 4  Knee extension 5 5  Ankle dorsiflexion 5 5  Ankle plantarflexion    Ankle inversion    Ankle eversion    (Blank rows = not tested)  BED MOBILITY:  Findings: Independent  TRANSFERS: Independent, uses upper extremities with sit to stand and stand pivot transfers.  RAMP:  Not tested  CURB:  Not tested  STAIRS: Not tested, reports step-to pattern with 2 feet on each step while using bilateral railing. GAIT: Findings: Gait Characteristics: decreased stance time- Left, decreased stride length, and antalgic, Distance walked: 50', Assistive device utilized:Single point cane, and Level of assistance: SBA  BALANCE TESTS:  Romberg: WFL Romberg w/ EC: slight increase in sway.  Tandem Stance: Able to maintain balance for 2'' prior to using UE support to regain balance.  FUNCTIONAL TESTS:  5 times sit to stand: 23.2'' (modified with UE use). Timed up and go (TUG): 24.69 with SPC 6 minute walk test: Test at next treatment session. 10 meter walk test: 16''; 0.63 m/s with SPC. BERG: Test at next treatment session.  PATIENT SURVEYS:  LEFS  Extreme difficulty/unable (0), Quite a bit of difficulty (1), Moderate difficulty (2), Little difficulty (3), No difficulty (4) Survey date:    Any of your usual work, housework or school activities 1  2. Usual hobbies, recreational or sporting activities 0  3. Getting into/out of the bath 0  4. Walking between rooms 3  5. Putting on socks/shoes 3  6. Squatting  1  7. Lifting an object, like a bag of groceries from the floor 2  8. Performing light activities around your home 2  9. Performing heavy activities around your home 1  10. Getting into/out of a car 3  11. Walking 2 blocks 0  12. Walking 1 mile 0  13. Going up/down 10 stairs (1 flight) 0  14. Standing for 1 hour 0  15.  sitting for 1 hour 0   16. Running on even ground 0  17. Running on uneven ground 0  18. Making sharp turns while running fast 0  19. Hopping  0  20. Rolling over in bed 3  Score total:  20/80                                                                                                                                 EVALUATION DATE: 09/01/2024   Patient demonstrates increased fall risk as noted by score of 40/56 on Berg Balance Scale.  (<36= high risk for falls, close to 100%; 37-45 significant >80%; 46-51 moderate >50%; 52-55 lower >25%)  6 Min Walk Test:  Instructed patient to ambulate as quickly and as safely as possible for 6 minutes using LRAD. Patient was allowed to take standing rest breaks without stopping the test, but if the patient required a sitting rest break the clock would be stopped and the test would be over.  Results: ceased at 57m24s due  to pt fatigue and reported hip pain, after ambulating around 400 ft. Results indicate that the patient has reduced endurance with ambulation compared to age matched norms.  Age Matched Norms: 61-69 yo M: 110 F: 70, 76-79 yo M: 47 F: 471, 72-89 yo M: 417 F: 392 MDC: 58.21 meters (190.98 feet) or 50 meters (ANPTA Core Set of Outcome Measures for Adults with Neurologic Conditions, 2018)  seated RTB hip abd, 2x10 sit <> stand, 3x8, B UE support on chair step ups onto first stair of stair case, 2x10 ea, B UE support on rail step ups onto first stair of stair case, 2x10 ea, w/ B LE 2# AW and only R UE support on rail seated marching w/ B LE 2# AW, 2x10    [facility] PT Assessment - 09/07/24 0001       Balance   Balance Assessed Yes      Berg Balance Test   Sit to Stand Able to stand  independently using hands    Standing Unsupported Able to stand safely 2 minutes    Sitting with Back Unsupported but Feet Supported on Floor or Stool Able to sit safely and securely 2 minutes    Stand to Sit Sits safely with minimal use of hands    Transfers Able to transfer  safely, definite need of hands    Standing Unsupported with Eyes Closed Able to stand 10 seconds with supervision    Standing Unsupported with Feet Together Able to place feet together independently and stand for 1 minute with supervision    From Standing, Reach Forward with Outstretched Arm Can reach forward >5 cm safely (2)    From Standing Position, Pick up Object from Floor Able to pick up shoe safely and easily    From Standing Position, Turn to Look Behind Over each Shoulder Looks behind one side only/other side shows less weight shift    Turn 360 Degrees Able to turn 360 degrees safely but slowly    Standing Unsupported, Alternately Place Feet on Step/Stool Able to complete >2 steps/needs minimal assist    Standing Unsupported, One Foot in Front Able to plae foot ahead of the other independently and hold 30 seconds    Standing on One Leg Tries to lift leg/unable to hold 3 seconds but remains standing independently    Total Score 40          PATIENT EDUCATION: Education details: Patient educated on proper exercise mechanics and techniques to improve LE strength. She was also educated on proper techniques for STS transfers for improved safety/effeciency. Person educated: Patient Education method: Explanation, Demonstration, Verbal cues, and Handouts Education comprehension: verbalized understanding and returned demonstration  HOME EXERCISE PROGRAM: Access Code: [email or web address] URL: [email or web address] Date: 09/01/2024 Prepared by: Sidra Simpers  Exercises - Seated Hip Abduction with Resistance  - 1 x daily - 7 x weekly - 3 sets - 10 reps - Sit to Stand with Arms Crossed  - 1 x daily - 7 x weekly - 3 sets - 10 reps - Seated March  - 1 x daily - 7 x weekly - 3 sets - 10 reps  GOALS: Goals reviewed with patient? Yes  SHORT TERM GOALS: Target date: 10/13/2024   1.  Patient (> 80 years old) will complete five times sit to stand test in < 18 seconds indicating an increased  LE strength and improved balance. Baseline: 60'' with UE use Goal status: INITIAL  2.  Patient will increase Berg Balance score by > 4  points to demonstrate decreased fall risk during functional activities. Baseline: 40/56 Goal status: INITIAL   3.  Patient will reduce timed up and go to <18 seconds to reduce fall risk and demonstrate improved transfer/gait ability. Baseline: 24.6'' with SPC Goal status: INITIAL  4.  Patient will demonstrate independence with current HEP showing proper mechanics/positioning with exercises allowing her to perform HEP at home with improved safety.   BASELINE: HEP initiated at evaluation with handout/education.  Goal status: INITIAL   LONG TERM GOALS: Target date: 11/24/2024  1.  Patient (> 78 years old) will complete five times sit to stand test in < 15 seconds indicating an increased LE strength and improved balance. Baseline: 36'' with UE use Goal status: INITIAL  2.  Patient will increase Berg Balance score by > 6 points to demonstrate decreased fall risk during functional activities. Baseline: 40/56 Goal status: INITIAL   3.  Patient will reduce timed up and go to <13 seconds to reduce fall risk and demonstrate improved transfer/gait ability. Baseline: 24.6 with SPC Goal status: INITIAL  4.  Patient will demonstrate tandem stance for 15'' without UE for balance recovery by discharge allowing her decreased risk for falls. 0 Baseline: Patient able to perform tandem stance for 2'' prior to LOB and need for UE support. Goal status: INITIAL  5.  Patient will increase six minute walk test distance to >1000 for progression to community ambulator and improve gait ability Baseline: 487ft but limited to 2:24 min due to pain in the hip.  Goal status: INITIAL    ASSESSMENT:  CLINICAL IMPRESSION:  Pt arrived to session ambulating with no AD but carrying SPC in case she needed it. Pt was able to complete full session with no use of SPC, which is one  her ultimate goals to accomplish. Performed BERG balance scale, with score showing significant risk for falls, with most difficulty noted with turning and SLS activities. ceased at 84m24s due to pt fatigue and reported hip pain, after ambulating around 400 ft. Session today focusing on HEP review and LE functional strength for increase in ability to perform ADLs confidently without LoB. Pt showed good ability in increased weight of LE with AW and decreased UE support to only one hand support during step up and hip flexion activities. Pt will continue to benefit from skilled therapy to address remaining deficits in order to improve overall QoL and return to PLOF.    OBJECTIVE IMPAIRMENTS: Abnormal gait, decreased activity tolerance, decreased balance, decreased coordination, decreased endurance, decreased mobility, difficulty walking, decreased strength, and pain.   ACTIVITY LIMITATIONS: walking, standing, squatting, stairs, and transfers   PARTICIPATION LIMITATIONS: meal prep, cleaning, laundry, driving, and community activity  PERSONAL FACTORS: Age, Past/current experiences, and 1-2 comorbidities: CAD, HTN, PAD are also affecting patient's functional outcome.   REHAB POTENTIAL: Good  CLINICAL DECISION MAKING: Stable/uncomplicated  EVALUATION COMPLEXITY: Moderate  PLAN:  PT FREQUENCY: 1-2x/week  PT DURATION: 12 weeks  PLANNED INTERVENTIONS: 97164- PT Re-evaluation, 97750- Physical Performance Testing, 97110-Therapeutic exercises, 97530- Therapeutic activity, [id number]- Neuromuscular re-education, 97535- Self Care, [id number]- Manual therapy, [phone]- Gait training, Patient/Family education, Balance training, Vestibular training, DME instructions, Cryotherapy, and Moist heat  PLAN FOR NEXT SESSION:   Continue working to improve LE strength and balance with exercises and balance activities.   Renna Helling, SPT  09/07/2024, 12:54 PM

## 2024-09-12 ENCOUNTER — Ambulatory Visit: Admitting: Physical Therapy

## 2024-09-12 DIAGNOSIS — R296 Repeated falls: Secondary | ICD-10-CM | POA: Insufficient documentation

## 2024-09-12 NOTE — Therapy (Signed)
 [facility]   Patient Name: Wanda Clayton MRN: [id number] DOB:07-16-1951, 73 y.o., female Today's Date: 09/12/2024   PCP: Dineen Rollene MATSU, FNP  REFERRING PROVIDER:  Abbey Bruckner, MD        END OF SESSION:  PT End of Session - 09/12/24 1134     Visit Number 3    Number of Visits 25    Date for Recertification  11/24/24    PT Start Time 1145    PT Stop Time 1227    PT Time Calculation (min) 42 min    Equipment Utilized During Treatment Gait belt    Activity Tolerance Patient tolerated treatment well;Patient limited by fatigue    Behavior During Therapy [facility] for tasks assessed/performed          Past Medical History:  Diagnosis Date   Acute pain of left foot 02/08/2020   Anxiety    Coronary artery disease    Depression    Dyspnea    Elevated liver enzymes 11/28/2020   High cholesterol    Hypertension    Peripheral vascular disease    Smoker 02/21/2018   Past Surgical History:  Procedure Laterality Date   BUNIONECTOMY Bilateral    carpal tunnel repair     COLONOSCOPY WITH PROPOFOL  N/A 08/19/2021   Procedure: COLONOSCOPY WITH PROPOFOL ;  Surgeon: Unk Corinn Skiff, MD;  Location: [facility];  Service: Gastroenterology;  Laterality: N/A;   DENTAL SURGERY     ENDARTERECTOMY FEMORAL Bilateral 09/16/2023   Procedure: ENDARTERECTOMY FEMORAL (BILATERAL SFA STENTS);  Surgeon: Jama Cordella MATSU, MD;  Location: [facility] ORS;  Service: Vascular;  Laterality: Bilateral;   FOOT SURGERY Right    LOWER EXTREMITY ANGIOGRAPHY Right 08/18/2023   Procedure: Lower Extremity Angiography;  Surgeon: Jama Cordella MATSU, MD;  Location: [facility];  Service: Cardiovascular;  Laterality: Right;   TUBAL LIGATION     VAGINAL DELIVERY     x1   Patient Active Problem List   Diagnosis Date Noted   Dizziness 08/16/2024   Recurrent falls 08/08/2024   Laceration of skin of elbow, right, sequela 08/08/2024   Displaced fracture of distal phalanx of  left thumb, initial encounter for closed fracture 08/05/2024   Abrasion of right upper arm 08/05/2024   Tachycardia 02/24/2024   Rash 11/23/2023   Lung nodule 10/22/2023   Wound dehiscence 10/22/2023   Atherosclerosis of artery of extremity with rest pain (HCC) 09/16/2023   B12 deficiency 09/01/2023   Atherosclerosis of native arteries of extremity with rest pain (HCC) 09/01/2023   Elevated MCV 05/27/2023   Chronic cerebral ischemia 05/10/2023   Discoloration of skin of lower leg 04/15/2023   Fatigue 04/01/2023   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   Hepatic steatosis 07/28/2022   Blood in stool    Polyp of ascending colon    Elevated liver enzymes 11/28/2020   Closed fracture of fifth metatarsal bone of left foot 02/08/2020   Carotid atherosclerosis, bilateral 01/11/2020   Trouble swallowing 11/30/2019   Family history of colon cancer 09/01/2018   Coronary artery calcification seen on CT scan 02/22/2018   Claudication in peripheral vascular disease 02/22/2018   Smoker 02/21/2018   Vertigo 01/06/2018   Atherosclerosis of aorta 11/26/2017   Hardening of the aorta (main artery of the heart) 11/26/2017   Osteopenia determined by x-ray 09/16/2017   Colitis, collagenous 05/22/2016   Chronic diarrhea 12/18/2015   Tremor 12/18/2015   Noninfective gastroenteritis and colitis 12/18/2015   Hearing loss 06/20/2015  Hyperlipidemia 06/20/2015   Routine general medical examination at a health care facility 06/01/2014   Ganglion cyst of wrist 02/02/2013   HTN (hypertension) 01/19/2012   Elevated blood-pressure reading without diagnosis of hypertension 01/19/2012   Atrophic vaginitis 12/11/2011   GAD (generalized anxiety disorder) 12/11/2011   Dysthymic disorder 12/11/2011    ONSET DATE: 08/05/2024  REFERRING DIAG: R29.6 (ICD-10-CM) - Recurrent falls   THERAPY DIAG:  No diagnosis found.  Rationale for Evaluation and Treatment: Rehabilitation  SUBJECTIVE:          Pt reports that  she has good days and bad days, and that today is an in between day. Reports she was able to walk down to the rehab gym through the hospital today with no issues. Pt reports having to be careful when getting out of bed still due to dizziness. States she's hurting all over and R hip pain is a 6/10.                                                                                                                        SUBJECTIVE STATEMENT: Pt accompanied by: self  PERTINENT HISTORY: Recent falls   PAIN:  09/12/2024: 6/10 pain in the R hip Are you having pain? Yes: NPRS scale: 0 Pain location: L hip Pain description: Sharp   Aggravating factors: Standing up Relieving factors: Positional changes/ Tylenol    PRECAUTIONS: Fall  RED FLAGS: None   WEIGHT BEARING RESTRICTIONS: No  FALLS: Has patient fallen in last 6 months? Yes. Number of falls 1  LIVING ENVIRONMENT: Lives with: lives with their spouse Lives in: House/apartment Stairs: Yes: External: 3 steps; on right going up and on left going up on back porch. Level entry from front of home. Has following equipment at home: Single point cane and Walker - 2 wheeled  PLOF: Independent with ADLs/household chores   PATIENT GOALS: Patient reports her goals include improved balance with walking and to be able to walk dogs with decreased risk of falls. She also wishes to walk without AD eventually.  OBJECTIVE:  Note: Objective measures were completed at Evaluation unless otherwise noted.  DIAGNOSTIC FINDINGS: None BP: 140/96 HR: 96 COGNITION: Overall cognitive status: Within functional limits for tasks assessed   SENSATION: Patient reports her toes are numb.   COORDINATION: Mild coordination deficits noted throughout treatment with gait/transfers.  EDEMA:  Not tested  MUSCLE TONE: Not tested  MUSCLE LENGTH: Not tested   DTRs:  Not tested  POSTURE: rounded shoulders and forward head  LOWER EXTREMITY ROM:    WFL  bilaterally.   LOWER EXTREMITY MMT:    MMT Right Eval Left Eval  Hip flexion 4 4  Hip extension    Hip abduction 4 4-  Hip adduction    Hip internal rotation 4+ 4+  Hip external rotation 4 4  Knee flexion 4 4  Knee extension 5 5  Ankle dorsiflexion 5 5  Ankle plantarflexion    Ankle inversion    Ankle  eversion    (Blank rows = not tested)  BED MOBILITY:  Findings: Independent  TRANSFERS: Independent, uses upper extremities with sit to stand and stand pivot transfers.  RAMP:  Not tested  CURB:  Not tested  STAIRS: Not tested, reports step-to pattern with 2 feet on each step while using bilateral railing. GAIT: Findings: Gait Characteristics: decreased stance time- Left, decreased stride length, and antalgic, Distance walked: 50', Assistive device utilized:Single point cane, and Level of assistance: SBA  BALANCE TESTS:  Romberg: WFL Romberg w/ EC: slight increase in sway.  Tandem Stance: Able to maintain balance for 2'' prior to using UE support to regain balance.  FUNCTIONAL TESTS:  5 times sit to stand: 23.2'' (modified with UE use). Timed up and go (TUG): 24.69 with SPC 6 minute walk test: Test at next treatment session. 10 meter walk test: 16''; 0.63 m/s with SPC. BERG: Test at next treatment session.  PATIENT SURVEYS:  LEFS  Extreme difficulty/unable (0), Quite a bit of difficulty (1), Moderate difficulty (2), Little difficulty (3), No difficulty (4) Survey date:    Any of your usual work, housework or school activities 1  2. Usual hobbies, recreational or sporting activities 0  3. Getting into/out of the bath 0  4. Walking between rooms 3  5. Putting on socks/shoes 3  6. Squatting  1  7. Lifting an object, like a bag of groceries from the floor 2  8. Performing light activities around your home 2  9. Performing heavy activities around your home 1  10. Getting into/out of a car 3  11. Walking 2 blocks 0  12. Walking 1 mile 0  13. Going up/down 10  stairs (1 flight) 0  14. Standing for 1 hour 0  15.  sitting for 1 hour 0  16. Running on even ground 0  17. Running on uneven ground 0  18. Making sharp turns while running fast 0  19. Hopping  0  20. Rolling over in bed 3  Score total:  20/80                                                                                                                                 EVALUATION DATE: 09/01/2024   TODAY'S TREATMENT: 09/12/2024  452ft ambulation, 3 laps, no AD used, CGA from SPT only sit<>stand x8, encouraged to use one hand only for support Sit<>stand with blue foam pad on chair for added height, 2x8, no UE used for support sideways step up/step downs onto 4in step, 2# on B LE, 2x10ea step ups from blue foam pad onto 4in step, 2x10 ea, 2# AW on B LE Static stance of blue foam pad, 2x30s Cross body ball raises w/ blue weighted ball on foam pad, 2x8 Standing hip flexion at bar, 2# AW on B LE, 2x20 Seated hip abduction, RTB around knees, 3x10, cue for slowed eccentric control 330ft ambulation, 2 laps, no AD used, CGA  from SPT only   PATIENT EDUCATION: Education details: Patient educated on proper exercise mechanics and techniques to improve LE strength. She was also educated on proper techniques for STS transfers for improved safety/effeciency. Person educated: Patient Education method: Explanation, Demonstration, Verbal cues, and Handouts Education comprehension: verbalized understanding and returned demonstration  HOME EXERCISE PROGRAM: Access Code: [email or web address] URL: [email or web address] Date: 09/01/2024 Prepared by: Sidra Simpers  Exercises - Seated Hip Abduction with Resistance  - 1 x daily - 7 x weekly - 3 sets - 10 reps - Sit to Stand with Arms Crossed  - 1 x daily - 7 x weekly - 3 sets - 10 reps - Seated March  - 1 x daily - 7 x weekly - 3 sets - 10 reps  GOALS: Goals reviewed with patient? Yes  SHORT TERM GOALS: Target date: 10/13/2024   1.  Patient (>  30 years old) will complete five times sit to stand test in < 18 seconds indicating an increased LE strength and improved balance. Baseline: 55'' with UE use Goal status: INITIAL  2.  Patient will increase Berg Balance score by > 4 points to demonstrate decreased fall risk during functional activities. Baseline: 40/56 Goal status: INITIAL   3.  Patient will reduce timed up and go to <18 seconds to reduce fall risk and demonstrate improved transfer/gait ability. Baseline: 24.6'' with SPC Goal status: INITIAL  4.  Patient will demonstrate independence with current HEP showing proper mechanics/positioning with exercises allowing her to perform HEP at home with improved safety.   BASELINE: HEP initiated at evaluation with handout/education.  Goal status: INITIAL   LONG TERM GOALS: Target date: 11/24/2024  1.  Patient (> 89 years old) will complete five times sit to stand test in < 15 seconds indicating an increased LE strength and improved balance. Baseline: 22'' with UE use Goal status: INITIAL  2.  Patient will increase Berg Balance score by > 6 points to demonstrate decreased fall risk during functional activities. Baseline: 40/56 Goal status: INITIAL   3.  Patient will reduce timed up and go to <13 seconds to reduce fall risk and demonstrate improved transfer/gait ability. Baseline: 24.6 with SPC Goal status: INITIAL  4.  Patient will demonstrate tandem stance for 15'' without UE for balance recovery by discharge allowing her decreased risk for falls. 0 Baseline: Patient able to perform tandem stance for 2'' prior to LOB and need for UE support. Goal status: INITIAL  5.  Patient will increase six minute walk test distance to >1000 for progression to community ambulator and improve gait ability Baseline: 414ft but limited to 2:24 min due to pain in the hip.  Goal status: INITIAL    ASSESSMENT:  CLINICAL IMPRESSION:  PT session today focusing on LE functional strength and  dynamic balance work for increased ability to perform ADLs confidently without LoB. Pt arrives to session with [facility] but does not rely on use of AD during entirety of session. Pt has continued concerns of balance difficulty, dizziness, and decreased endurance. Pt was able to perform sit<>stand exercises without any UE support today following addition of blue foam pad on chair for increased height after pt initially verbalized apprehension to perform without UE. Pt stated static stance of foam pad was low difficulty and showed minimal sway during activity. Pt noted difficulty during cross body ball raises on foam pad, mostly in the UE and not with balance. Pt continued to show great motivation for improvement in today's session. Pt tolerated 2# AW  exercises well, stating it was challenging but wanted to continue the use of them. Pt showed minor SoB following bouts of activity in today's session and took a few <3 minute seated rest breaks. Pt performance of laps and ambulation without SPC showed low difficulty for pt, and next session should benefit from more dynamic/weighted ambulation training. Pt will continue to benefit from skilled therapy to address remaining deficits in order to improve overall QoL and return to PLOF.   OBJECTIVE IMPAIRMENTS: Abnormal gait, decreased activity tolerance, decreased balance, decreased coordination, decreased endurance, decreased mobility, difficulty walking, decreased strength, and pain.   ACTIVITY LIMITATIONS: walking, standing, squatting, stairs, and transfers   PARTICIPATION LIMITATIONS: meal prep, cleaning, laundry, driving, and community activity  PERSONAL FACTORS: Age, Past/current experiences, and 1-2 comorbidities: CAD, HTN, PAD are also affecting patient's functional outcome.   REHAB POTENTIAL: Good  CLINICAL DECISION MAKING: Stable/uncomplicated  EVALUATION COMPLEXITY: Moderate  PLAN:  PT FREQUENCY: 1-2x/week  PT DURATION: 12 weeks  PLANNED  INTERVENTIONS: [id number]- PT Re-evaluation, 97750- Physical Performance Testing, 97110-Therapeutic exercises, 97530- Therapeutic activity, [id number]- Neuromuscular re-education, 97535- Self Care, [id number]- Manual therapy, [phone]- Gait training, Patient/Family education, Balance training, Vestibular training, DME instructions, Cryotherapy, and Moist heat  PLAN FOR NEXT SESSION:   Continue working to improve LE strength and balance with exercises and balance activities. Dynamic ambulation/balance training/head turns/ankle weights   Renna Helling, SPT   Maryanne Finder, PT, DPT Physical Therapist - [facility] 09/12/2024, 1:37 PM

## 2024-09-13 ENCOUNTER — Ambulatory Visit

## 2024-09-14 ENCOUNTER — Ambulatory Visit

## 2024-09-14 DIAGNOSIS — R296 Repeated falls: Secondary | ICD-10-CM

## 2024-09-14 NOTE — Therapy (Signed)
 [facility]   Patient Name: KRISTILYN COLTRANE MRN: [id number] DOB:1951/01/23, 73 y.o., female Today's Date: 09/14/2024   PCP: Dineen Rollene MATSU, FNP  REFERRING PROVIDER: Abbey Bruckner, MD  END OF SESSION:  PT End of Session - 09/14/24 1103     Visit Number 4    Number of Visits 25    Date for Recertification  11/24/24    PT Start Time 1103    PT Stop Time 1145    PT Time Calculation (min) 42 min    Equipment Utilized During Treatment Gait belt    Activity Tolerance Patient tolerated treatment well;Patient limited by fatigue    Behavior During Therapy [facility] for tasks assessed/performed         Past Medical History:  Diagnosis Date   Acute pain of left foot 02/08/2020   Anxiety    Coronary artery disease    Depression    Dyspnea    Elevated liver enzymes 11/28/2020   High cholesterol    Hypertension    Peripheral vascular disease    Smoker 02/21/2018   Past Surgical History:  Procedure Laterality Date   BUNIONECTOMY Bilateral    carpal tunnel repair     COLONOSCOPY WITH PROPOFOL  N/A 08/19/2021   Procedure: COLONOSCOPY WITH PROPOFOL ;  Surgeon: Unk Corinn Skiff, MD;  Location: [facility];  Service: Gastroenterology;  Laterality: N/A;   DENTAL SURGERY     ENDARTERECTOMY FEMORAL Bilateral 09/16/2023   Procedure: ENDARTERECTOMY FEMORAL (BILATERAL SFA STENTS);  Surgeon: Jama Cordella MATSU, MD;  Location: [facility] ORS;  Service: Vascular;  Laterality: Bilateral;   FOOT SURGERY Right    LOWER EXTREMITY ANGIOGRAPHY Right 08/18/2023   Procedure: Lower Extremity Angiography;  Surgeon: Jama Cordella MATSU, MD;  Location: [facility];  Service: Cardiovascular;  Laterality: Right;   TUBAL LIGATION     VAGINAL DELIVERY     x1   Patient Active Problem List   Diagnosis Date Noted   Dizziness 08/16/2024   Recurrent falls 08/08/2024   Laceration of skin of elbow, right, sequela 08/08/2024   Displaced fracture of distal phalanx of left thumb,  initial encounter for closed fracture 08/05/2024   Abrasion of right upper arm 08/05/2024   Tachycardia 02/24/2024   Rash 11/23/2023   Lung nodule 10/22/2023   Wound dehiscence 10/22/2023   Atherosclerosis of artery of extremity with rest pain (HCC) 09/16/2023   B12 deficiency 09/01/2023   Atherosclerosis of native arteries of extremity with rest pain (HCC) 09/01/2023   Elevated MCV 05/27/2023   Chronic cerebral ischemia 05/10/2023   Discoloration of skin of lower leg 04/15/2023   Fatigue 04/01/2023   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   Hepatic steatosis 07/28/2022   Blood in stool    Polyp of ascending colon    Elevated liver enzymes 11/28/2020   Closed fracture of fifth metatarsal bone of left foot 02/08/2020   Carotid atherosclerosis, bilateral 01/11/2020   Trouble swallowing 11/30/2019   Family history of colon cancer 09/01/2018   Coronary artery calcification seen on CT scan 02/22/2018   Claudication in peripheral vascular disease 02/22/2018   Smoker 02/21/2018   Vertigo 01/06/2018   Atherosclerosis of aorta 11/26/2017   Hardening of the aorta (main artery of the heart) 11/26/2017   Osteopenia determined by x-ray 09/16/2017   Colitis, collagenous 05/22/2016   Chronic diarrhea 12/18/2015   Tremor 12/18/2015   Noninfective gastroenteritis and colitis 12/18/2015   Hearing loss 06/20/2015   Hyperlipidemia 06/20/2015   Routine general medical examination  at a health care facility 06/01/2014   Ganglion cyst of wrist 02/02/2013   HTN (hypertension) 01/19/2012   Elevated blood-pressure reading without diagnosis of hypertension 01/19/2012   Atrophic vaginitis 12/11/2011   GAD (generalized anxiety disorder) 12/11/2011   Dysthymic disorder 12/11/2011    ONSET DATE: 08/05/2024  REFERRING DIAG: R29.6 (ICD-10-CM) - Recurrent falls   THERAPY DIAG:  Repeated falls  Rationale for Evaluation and Treatment: Rehabilitation  SUBJECTIVE:   Pt reports that the other day, she was  put through the ringer during therapy, but is doing well.                                                                                                                         SUBJECTIVE STATEMENT: Pt accompanied by: self  PERTINENT HISTORY: Recent falls   PAIN:  09/12/2024: 0/10 pain in the R hip but wearing patches and took a tylenol  earlier (7-8AM) Are you having pain? Yes: NPRS scale: 0 Pain location: L hip Pain description: Sharp   Aggravating factors: Standing up Relieving factors: Positional changes/ Tylenol    PRECAUTIONS: Fall  RED FLAGS: None   WEIGHT BEARING RESTRICTIONS: No  FALLS: Has patient fallen in last 6 months? Yes. Number of falls 1  LIVING ENVIRONMENT: Lives with: lives with their spouse Lives in: House/apartment Stairs: Yes: External: 3 steps; on right going up and on left going up on back porch. Level entry from front of home. Has following equipment at home: Single point cane and Walker - 2 wheeled  PLOF: Independent with ADLs/household chores   PATIENT GOALS: Patient reports her goals include improved balance with walking and to be able to walk dogs with decreased risk of falls. She also wishes to walk without AD eventually.  OBJECTIVE:  Note: Objective measures were completed at Evaluation unless otherwise noted.  DIAGNOSTIC FINDINGS: None BP: 140/96 HR: 96 COGNITION: Overall cognitive status: Within functional limits for tasks assessed   SENSATION: Patient reports her toes are numb.   COORDINATION: Mild coordination deficits noted throughout treatment with gait/transfers.  EDEMA:  Not tested  MUSCLE TONE: Not tested  MUSCLE LENGTH: Not tested   DTRs:  Not tested  POSTURE: rounded shoulders and forward head  LOWER EXTREMITY ROM:    WFL bilaterally.   LOWER EXTREMITY MMT:    MMT Right Eval Left Eval  Hip flexion 4 4  Hip extension    Hip abduction 4 4-  Hip adduction    Hip internal rotation 4+ 4+  Hip external  rotation 4 4  Knee flexion 4 4  Knee extension 5 5  Ankle dorsiflexion 5 5  Ankle plantarflexion    Ankle inversion    Ankle eversion    (Blank rows = not tested)  BED MOBILITY:  Findings: Independent  TRANSFERS: Independent, uses upper extremities with sit to stand and stand pivot transfers.  RAMP:  Not tested  CURB:  Not tested  STAIRS: Not tested, reports step-to pattern with 2 feet on each  step while using bilateral railing. GAIT: Findings: Gait Characteristics: decreased stance time- Left, decreased stride length, and antalgic, Distance walked: 50', Assistive device utilized:Single point cane, and Level of assistance: SBA  BALANCE TESTS:  Romberg: WFL Romberg w/ EC: slight increase in sway.  Tandem Stance: Able to maintain balance for 2'' prior to using UE support to regain balance.  FUNCTIONAL TESTS:  5 times sit to stand: 23.2'' (modified with UE use). Timed up and go (TUG): 24.69 with SPC 6 minute walk test: Test at next treatment session. 10 meter walk test: 16''; 0.63 m/s with SPC. BERG: Test at next treatment session.  PATIENT SURVEYS:  LEFS  Extreme difficulty/unable (0), Quite a bit of difficulty (1), Moderate difficulty (2), Little difficulty (3), No difficulty (4) Survey date:    Any of your usual work, housework or school activities 1  2. Usual hobbies, recreational or sporting activities 0  3. Getting into/out of the bath 0  4. Walking between rooms 3  5. Putting on socks/shoes 3  6. Squatting  1  7. Lifting an object, like a bag of groceries from the floor 2  8. Performing light activities around your home 2  9. Performing heavy activities around your home 1  10. Getting into/out of a car 3  11. Walking 2 blocks 0  12. Walking 1 mile 0  13. Going up/down 10 stairs (1 flight) 0  14. Standing for 1 hour 0  15.  sitting for 1 hour 0  16. Running on even ground 0  17. Running on uneven ground 0  18. Making sharp turns while running fast 0  19.  Hopping  0  20. Rolling over in bed 3  Score total:  20/80                                                                                                                                 EVALUATION DATE: 09/01/2024   TODAY'S TREATMENT: 09/14/2024   Gait Training:  46ft ambulation, 3 laps, no AD used, CGA from SPT only   TherAct: To improve functional movements patterns for everyday tasks  STS x10 with purple airex pad placed under buttocks for increased height STS x5 with purple airex pad, x5 with pillow for slightly reduced height under buttocks Seated hip abduction with RTB, one leg stabilizing, 2x10 each LE Cable column walkouts, forward/lateral/backward, 7.5#, x5 backward, x3 lateral to the R, x2 lateral to the L, x2 forward before pt fatigued and requested to stop due to pain in hips and back   PATIENT EDUCATION: Education details: Patient educated on proper exercise mechanics and techniques to improve LE strength. She was also educated on proper techniques for STS transfers for improved safety/effeciency. Person educated: Patient Education method: Explanation, Demonstration, Verbal cues, and Handouts Education comprehension: verbalized understanding and returned demonstration  HOME EXERCISE PROGRAM: Access Code: [email or web address] URL: [email or web address] Date: 09/01/2024 Prepared by: Sidra Simpers  Exercises - Seated  Hip Abduction with Resistance  - 1 x daily - 7 x weekly - 3 sets - 10 reps - Sit to Stand with Arms Crossed  - 1 x daily - 7 x weekly - 3 sets - 10 reps - Seated March  - 1 x daily - 7 x weekly - 3 sets - 10 reps  GOALS: Goals reviewed with patient? Yes  SHORT TERM GOALS: Target date: 10/13/2024   1.  Patient (> 42 years old) will complete five times sit to stand test in < 18 seconds indicating an increased LE strength and improved balance. Baseline: 78'' with UE use Goal status: INITIAL  2.  Patient will increase Berg Balance score by > 4  points to demonstrate decreased fall risk during functional activities. Baseline: 40/56 Goal status: INITIAL   3.  Patient will reduce timed up and go to <18 seconds to reduce fall risk and demonstrate improved transfer/gait ability. Baseline: 24.6'' with SPC Goal status: INITIAL  4.  Patient will demonstrate independence with current HEP showing proper mechanics/positioning with exercises allowing her to perform HEP at home with improved safety.   BASELINE: HEP initiated at evaluation with handout/education.  Goal status: INITIAL   LONG TERM GOALS: Target date: 11/24/2024  1.  Patient (> 22 years old) will complete five times sit to stand test in < 15 seconds indicating an increased LE strength and improved balance. Baseline: 64'' with UE use Goal status: INITIAL  2.  Patient will increase Berg Balance score by > 6 points to demonstrate decreased fall risk during functional activities. Baseline: 40/56 Goal status: INITIAL   3.  Patient will reduce timed up and go to <13 seconds to reduce fall risk and demonstrate improved transfer/gait ability. Baseline: 24.6 with SPC Goal status: INITIAL  4.  Patient will demonstrate tandem stance for 15'' without UE for balance recovery by discharge allowing her decreased risk for falls. 0 Baseline: Patient able to perform tandem stance for 2'' prior to LOB and need for UE support. Goal status: INITIAL  5.  Patient will increase six minute walk test distance to >1000 for progression to community ambulator and improve gait ability Baseline: 482ft but limited to 2:24 min due to pain in the hip.  Goal status: INITIAL    ASSESSMENT:  CLINICAL IMPRESSION:   Pt responded well to the exercises given, however noted some increased pain in the hip when performing the resisted walking.  Pt likely experiencing muscle fatigue and will continue to benefit from continued attempts to improve overall tolerance to exercises.  Pt encouraged to perform HEP to  build up the endurance on the days not present in therapy.  Pt is making good progress with the STS's as well.   Pt will continue to benefit from skilled therapy to address remaining deficits in order to improve overall QoL and return to PLOF.     OBJECTIVE IMPAIRMENTS: Abnormal gait, decreased activity tolerance, decreased balance, decreased coordination, decreased endurance, decreased mobility, difficulty walking, decreased strength, and pain.   ACTIVITY LIMITATIONS: walking, standing, squatting, stairs, and transfers   PARTICIPATION LIMITATIONS: meal prep, cleaning, laundry, driving, and community activity  PERSONAL FACTORS: Age, Past/current experiences, and 1-2 comorbidities: CAD, HTN, PAD are also affecting patient's functional outcome.   REHAB POTENTIAL: Good  CLINICAL DECISION MAKING: Stable/uncomplicated  EVALUATION COMPLEXITY: Moderate  PLAN:  PT FREQUENCY: 1-2x/week  PT DURATION: 12 weeks  PLANNED INTERVENTIONS: [id number]- PT Re-evaluation, 97750- Physical Performance Testing, 97110-Therapeutic exercises, 97530- Therapeutic activity, [id number]- Neuromuscular re-education, 97535-  Self Care, [id number]- Manual therapy, [phone]- Gait training, Patient/Family education, Balance training, Vestibular training, DME instructions, Cryotherapy, and Moist heat  PLAN FOR NEXT SESSION:   Continue working to improve LE strength and balance with exercises and balance activities. Dynamic ambulation/balance training/head turns/ankle weights    Fonda Simpers, PT, DPT Physical Therapist - [facility]  09/14/24, 12:33 PM

## 2024-09-15 ENCOUNTER — Ambulatory Visit (INDEPENDENT_AMBULATORY_CARE_PROVIDER_SITE_OTHER): Admitting: Nurse Practitioner

## 2024-09-15 ENCOUNTER — Other Ambulatory Visit (INDEPENDENT_AMBULATORY_CARE_PROVIDER_SITE_OTHER)

## 2024-09-15 ENCOUNTER — Encounter (INDEPENDENT_AMBULATORY_CARE_PROVIDER_SITE_OTHER): Payer: Self-pay | Admitting: Nurse Practitioner

## 2024-09-15 VITALS — BP 182/76 | HR 91 | Resp 18 | Ht 63.0 in | Wt 142.0 lb

## 2024-09-15 DIAGNOSIS — I739 Peripheral vascular disease, unspecified: Secondary | ICD-10-CM | POA: Diagnosis not present

## 2024-09-15 DIAGNOSIS — I6523 Occlusion and stenosis of bilateral carotid arteries: Secondary | ICD-10-CM

## 2024-09-15 DIAGNOSIS — R42 Dizziness and giddiness: Secondary | ICD-10-CM

## 2024-09-15 DIAGNOSIS — G458 Other transient cerebral ischemic attacks and related syndromes: Secondary | ICD-10-CM | POA: Diagnosis not present

## 2024-09-15 DIAGNOSIS — Z9889 Other specified postprocedural states: Secondary | ICD-10-CM | POA: Diagnosis not present

## 2024-09-18 NOTE — Progress Notes (Signed)
 Subjective:    [name], female    DOB: 12/26/1950, 73 y.o.   MRN: [id number] Chief Complaint  Patient presents with   Follow-up    Follow up December, ABI + carotid     HPI  Discussed the use of AI scribe software for clinical note transcription with the patient, who gave verbal consent to proceed.  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 She was referred by Rollene Northern for evaluation of dizziness and falls.  She experiences dizziness and vertigo, particularly when getting out of bed too quickly or moving her head rapidly. This has been a persistent issue for years. She is undergoing physical therapy for balance issues, which has been helpful.  She describes numbness and tingling in her left hand, occurring during the night, while driving, or during other activities. This has been a recurring problem and is currently painful due to a recent fall. There is a noted difference in blood pressure between her arms.  She has a history of peripheral artery disease and has undergone previous surgeries, including a stent placement in her heart. Her legs are weak, and she lacks muscle, which is being addressed through physical therapy. She has difficulty with balance and standing without support. Her feet are sometimes cold, and she cannot feel her toes, which she attributes to previous bunion surgery and the insertion of steel rods in her toes.  She has a history of smoking, which is relevant to her vascular issues. Her right leg's ABI has improved, while the left has decreased slightly. She is working on improving her physical condition through therapy, focusing on building strength and balance. No new wounds or ulcers on feet.    Results RADIOLOGY Subclavian artery ultrasound: left Subclavian stenosis with retrograde flow in left vertebral artery (09/15/2024) Carotid artery  ultrasound: 1-39% stenosis in both carotid arteries (09/15/2024)  DIAGNOSTIC ABI: Right leg 1.02, left leg 0.87 (09/15/2024) TBI: Right leg 0.36, left leg 0.28 (09/15/2024)   Review of Systems  Skin:  Negative for wound.  Neurological:  Positive for dizziness and weakness.  All other systems reviewed and are negative.      Objective:   Physical Exam Vitals reviewed.  HENT:     Head: Normocephalic.  Cardiovascular:     Rate and Rhythm: Normal rate.  Pulmonary:     Effort: Pulmonary effort is normal.  Skin:    General: Skin is warm and dry.  Neurological:     Mental Status: She is alert and oriented to person, place, and time.  Psychiatric:        Mood and Affect: Mood normal.        Behavior: Behavior normal.        Thought Content: Thought content normal.     Physical Exam CARDIOVASCULAR: Left pulse present but weaker than right.  BP (!) 182/76 (BP Location: Right Arm, Patient Position: Sitting, Cuff Size: Normal)   Pulse 91   Resp 18   Ht 5' 3 (1.6 m)   Wt 142 lb (64.4 kg)   BMI 25.15 kg/m   Past Medical History:  Diagnosis Date   Acute pain of left foot 02/08/2020   Anxiety    Coronary artery disease    Depression    Dyspnea    Elevated liver enzymes 11/28/2020   High cholesterol    Hypertension    Peripheral vascular disease  Smoker 02/21/2018    Social History   Socioeconomic History   Marital status: Married    Spouse name: Merida Alcantar   Number of children: 1   Years of education: Not on file   Highest education level: Not on file  Occupational History    Employer: [employer]  Tobacco Use   Smoking status: Every Day    Current packs/day: 0.50    Average packs/day: 0.5 packs/day for 36.0 years (18.0 ttl pk-yrs)    Types: Cigarettes   Smokeless tobacco: Never   Tobacco comments:    she plans to quit without medication  Vaping Use   Vaping status: Never Used  Substance and Sexual Activity   Alcohol use: Yes    Alcohol/week:  14.0 standard drinks of alcohol    Types: 14 Glasses of wine per week    Comment: anywhere from 2-4 drinks per day, everyday   Drug use: No   Sexual activity: Not Currently    Birth control/protection: None, Post-menopausal  Other Topics Concern   Not on file  Social History Narrative   Works for [employer], retired 06/2016   Married.   Daughter and granddaughter.          Mother passed 07/2024      Social Drivers of Health   Financial Resource Strain: Patient Declined (03/25/2024)   Overall Financial Resource Strain (CARDIA)    Difficulty of Paying Living Expenses: Patient declined  Food Insecurity: No Food Insecurity (03/25/2024)   Hunger Vital Sign    Worried About Running Out of Food in the Last Year: Never true    Ran Out of Food in the Last Year: Never true  Transportation Needs: No Transportation Needs (03/25/2024)   PRAPARE - Administrator, Civil Service (Medical): No    Lack of Transportation (Non-Medical): No  Physical Activity: Inactive (03/25/2024)   Exercise Vital Sign    Days of Exercise per Week: 0 days    Minutes of Exercise per Session: 0 min  Stress: No Stress Concern Present (03/25/2024)   [employer] of Occupational Health - Occupational Stress Questionnaire    Feeling of Stress: Only a little  Social Connections: Moderately Integrated (03/25/2024)   Social Connection and Isolation Panel    Frequency of Communication with Friends and Family: More than three times a week    Frequency of Social Gatherings with Friends and Family: Twice a week    Attends Religious Services: More than 4 times per year    Active Member of [employer] or Organizations: No    Attends Banker Meetings: Never    Marital Status: Married  Catering Manager Violence: Not At Risk (03/25/2024)   Humiliation, Afraid, Rape, and Kick questionnaire    Fear of Current or Ex-Partner: No    Emotionally Abused: No    Physically Abused: No    Sexually Abused: No     Past Surgical History:  Procedure Laterality Date   BUNIONECTOMY Bilateral    carpal tunnel repair     COLONOSCOPY WITH PROPOFOL  N/A 08/19/2021   Procedure: COLONOSCOPY WITH PROPOFOL ;  Surgeon: Unk Corinn Skiff, MD;  Location: [facility];  Service: Gastroenterology;  Laterality: N/A;   DENTAL SURGERY     ENDARTERECTOMY FEMORAL Bilateral 09/16/2023   Procedure: ENDARTERECTOMY FEMORAL (BILATERAL SFA STENTS);  Surgeon: Jama Cordella MATSU, MD;  Location: [facility] OR[facility];  Service: Vascular;  Laterality: Bilateral;   FOOT SURGERY Right    LOWER EXTREMITY ANGIOGRAPHY Right 08/18/2023  Procedure: Lower Extremity Angiography;  Surgeon: Jama Cordella MATSU, MD;  Location: [facility];  Service: Cardiovascular;  Laterality: Right;   TUBAL LIGATION     VAGINAL DELIVERY     x1    Family History  Problem Relation Age of Onset   Hypertension Mother    Colon cancer Mother 70   Atrial fibrillation Mother    Dementia Mother 74   Heart disease Father    Diabetes Father    Colon cancer Father 55   Congestive Heart Failure Father    COPD Sister    Crohn's disease Sister    Colon cancer Sister 31   Breast cancer Sister    Heart attack Sister    Heart disease Brother    Parkinson's disease Maternal Grandfather 12   Hypertension Daughter     Allergies  Allergen Reactions   Cat Dander    Pollen Extract Itching       Latest Ref Rng & Units 03/30/2024   10:09 AM 10/03/2023    4:23 PM 09/17/2023   12:01 AM  CBC  WBC 4.0 - 10.5 K/uL 8.0  12.8  9.8   Hemoglobin 12.0 - 15.0 g/dL 87.5  87.5  88.8   Hematocrit 36.0 - 46.0 % 37.5  37.3  32.7   Platelets 150.0 - 400.0 K/uL 354.0  526  178       CMP     Component Value Date/Time   NA 138 03/30/2024 1009   NA 140 04/19/2015 0000   K 4.7 03/30/2024 1009   CL 100 03/30/2024 1009   CO2 27 03/30/2024 1009   GLUCOSE 118 (H) 03/30/2024 1009   BUN 8 03/30/2024 1009   BUN 14 04/19/2015 0000   CREATININE 0.85 03/30/2024 1009    CALCIUM  9.5 03/30/2024 1009   PROT 7.1 03/30/2024 1009   PROT 7.1 02/24/2023 0919   ALBUMIN  4.0 03/30/2024 1009   ALBUMIN  4.4 02/24/2023 0919   AST 31 03/30/2024 1009   ALT 22 03/30/2024 1009   ALKPHOS 142 (H) 04/29/2024 0918   BILITOT 0.6 03/30/2024 1009   BILITOT 0.5 02/24/2023 0919   GFR 68.19 03/30/2024 1009   GFRNONAA >60 10/03/2023 1623     No results found.     Assessment & Plan:   1. Peripheral arterial disease with history of revascularization (Primary) Peripheral arterial disease with history of revascularization Peripheral arterial disease with previous revascularization. Current ABI and TBI values show some fluctuation but are not concerning. Symptoms include muscle weakness, likely due to physical deconditioning rather than acute ischemia. She is undergoing physical therapy to improve muscle strength and balance. - Continue physical therapy to improve muscle strength and balance. - Advised smoking cessation to prevent further vascular damage. - Instructed to monitor for new or worsening symptoms, such as non-healing wounds or worsening coldness in the feet. - Schedule follow-up ultrasound in six months to assess arterial status. 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 Symptoms include dizziness and left arm numbness and tingling. She hesitated about intervention due to previous negative experiences with vascular procedures. - Monitor symptoms, including dizziness and left arm numbness and tingling. - Consider intervention if symptoms worsen or new symptoms develop. - Educated on low risk of complications with potential intervention. - VAS US  CAROTID; Future  3. Bilateral carotid artery stenosis Bilateral carotid  artery stenosis Minimal stenosis (1-39%) in both carotid  arteries. No immediate intervention required as stenosis is not significant. - Continue monitoring carotid artery status. - VAS US  CAROTID; Future   Assessment and Plan Assessment & Plan          Current Outpatient Medications on File Prior to Visit  Medication Sig Dispense Refill   acetaminophen  (TYLENOL ) 325 MG tablet Take 1-2 tablets (325-650 mg total) by mouth every 4 (four) hours as needed for mild pain (pain score 1-3) (or temp >/= 101 F).     amLODipine  (NORVASC ) 2.5 MG tablet Take 1 tablet (2.5 mg total) by mouth daily. 90 tablet 3   amLODipine  (NORVASC ) 5 MG tablet Take 1 tablet (5 mg total) by mouth daily as needed. 90 tablet 3   amoxicillin -clavulanate (AUGMENTIN ) 875-125 MG tablet Take 1 tablet by mouth every 12 (twelve) hours. 10 tablet 0   aspirin  81 MG tablet Take 81 mg by mouth daily.     atorvastatin  (LIPITOR) 40 MG tablet Take 1 tablet (40 mg total) by mouth daily. 100 tablet 3   atorvastatin  (LIPITOR) 40 MG tablet Take 1 tablet (40 mg total) by mouth daily at 6 PM. 90 tablet 1   CALCIUM  PO Take 500 mg by mouth daily.     Cholecalciferol (VITAMIN D3 PO) Take 25 mcg by mouth daily.     clopidogrel  (PLAVIX ) 75 MG tablet Take 1 tablet (75 mg total) by mouth daily at 6 (six) AM. 90 tablet 0   Cyanocobalamin  (B-12 PO) Take 500 mcg by mouth daily.     ezetimibe  (ZETIA ) 10 MG tablet Take 1 tablet (10 mg total) by mouth daily. 90 tablet 3   FLUoxetine  (PROZAC ) 40 MG capsule Take 1 capsule (40 mg total) by mouth every morning. 90 capsule 3   hydrOXYzine  (ATARAX ) 25 MG tablet Take 25 mg by mouth 3 (three) times daily as needed.     meclizine  (ANTIVERT ) 12.5 MG tablet Take 1 tablet (12.5 mg total) by mouth 3 (three) times daily as needed (vertigo). 30 tablet 0   Multiple Vitamins-Minerals (EQ MULTIVITAMINS ADULT GUMMY PO) Take 2 tablets by mouth daily. (Patient not taking: Reported on 09/15/2024)     No current facility-administered medications on file prior to visit.     There are no Patient Instructions on file for this visit. Return for 6 month Carotid, ABI, bil art duplex to see JD/FB.   Virgil Lightner E Linetta Regner, NP

## 2024-09-19 ENCOUNTER — Ambulatory Visit

## 2024-09-19 ENCOUNTER — Encounter (INDEPENDENT_AMBULATORY_CARE_PROVIDER_SITE_OTHER): Payer: Self-pay | Admitting: Nurse Practitioner

## 2024-09-19 DIAGNOSIS — R296 Repeated falls: Secondary | ICD-10-CM

## 2024-09-19 NOTE — Therapy (Signed)
 [facility]   Patient Name: Wanda Clayton MRN: [id number] DOB:18-Nov-1950, 73 y.o., female Today's Date: 09/19/2024   PCP: Dineen Rollene MATSU, FNP  REFERRING PROVIDER: Abbey Bruckner, MD  END OF SESSION:  PT End of Session - 09/19/24 1130     Visit Number 5    Number of Visits 25    Date for Recertification  11/24/24    PT Start Time 1145    PT Stop Time 1226    PT Time Calculation (min) 41 min    Equipment Utilized During Treatment Gait belt    Activity Tolerance Patient tolerated treatment well;Patient limited by fatigue    Behavior During Therapy [facility] for tasks assessed/performed         Past Medical History:  Diagnosis Date   Acute pain of left foot 02/08/2020   Anxiety    Coronary artery disease    Depression    Dyspnea    Elevated liver enzymes 11/28/2020   High cholesterol    Hypertension    Peripheral vascular disease    Smoker 02/21/2018   Past Surgical History:  Procedure Laterality Date   BUNIONECTOMY Bilateral    carpal tunnel repair     COLONOSCOPY WITH PROPOFOL  N/A 08/19/2021   Procedure: COLONOSCOPY WITH PROPOFOL ;  Surgeon: Unk Corinn Skiff, MD;  Location: [facility] ENDOSCOPY;  Service: Gastroenterology;  Laterality: N/A;   DENTAL SURGERY     ENDARTERECTOMY FEMORAL Bilateral 09/16/2023   Procedure: ENDARTERECTOMY FEMORAL (BILATERAL SFA STENTS);  Surgeon: Jama Cordella MATSU, MD;  Location: [facility] ORS;  Service: Vascular;  Laterality: Bilateral;   FOOT SURGERY Right    LOWER EXTREMITY ANGIOGRAPHY Right 08/18/2023   Procedure: Lower Extremity Angiography;  Surgeon: Jama Cordella MATSU, MD;  Location: [facility] INVASIVE CV LAB;  Service: Cardiovascular;  Laterality: Right;   TUBAL LIGATION     VAGINAL DELIVERY     x1   Patient Active Problem List   Diagnosis Date Noted   Dizziness 08/16/2024   Recurrent falls 08/08/2024   Laceration of skin of elbow, right, sequela 08/08/2024   Displaced fracture of distal phalanx of left thumb,  initial encounter for closed fracture 08/05/2024   Abrasion of right upper arm 08/05/2024   Tachycardia 02/24/2024   Rash 11/23/2023   Lung nodule 10/22/2023   Wound dehiscence 10/22/2023   Atherosclerosis of artery of extremity with rest pain (HCC) 09/16/2023   B12 deficiency 09/01/2023   Atherosclerosis of native arteries of extremity with rest pain (HCC) 09/01/2023   Elevated MCV 05/27/2023   Chronic cerebral ischemia 05/10/2023   Discoloration of skin of lower leg 04/15/2023   Fatigue 04/01/2023   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   Hepatic steatosis 07/28/2022   Blood in stool    Polyp of ascending colon    Elevated liver enzymes 11/28/2020   Closed fracture of fifth metatarsal bone of left foot 02/08/2020   Carotid atherosclerosis, bilateral 01/11/2020   Trouble swallowing 11/30/2019   Family history of colon cancer 09/01/2018   Coronary artery calcification seen on CT scan 02/22/2018   Claudication in peripheral vascular disease 02/22/2018   Smoker 02/21/2018   Vertigo 01/06/2018   Atherosclerosis of aorta 11/26/2017   Hardening of the aorta (main artery of the heart) 11/26/2017   Osteopenia determined by x-ray 09/16/2017   Colitis, collagenous 05/22/2016   Chronic diarrhea 12/18/2015   Tremor 12/18/2015   Noninfective gastroenteritis and colitis 12/18/2015   Hearing loss 06/20/2015   Hyperlipidemia 06/20/2015   Routine general medical examination  at a health care facility 06/01/2014   Ganglion cyst of wrist 02/02/2013   HTN (hypertension) 01/19/2012   Elevated blood-pressure reading without diagnosis of hypertension 01/19/2012   Atrophic vaginitis 12/11/2011   GAD (generalized anxiety disorder) 12/11/2011   Dysthymic disorder 12/11/2011    ONSET DATE: 08/05/2024  REFERRING DIAG: R29.6 (ICD-10-CM) - Recurrent falls   THERAPY DIAG:  Repeated falls  Rationale for Evaluation and Treatment: Rehabilitation  SUBJECTIVE:   Pt reports that she is doing well today.  She reports that on Saturday she reached down to grab a tube of toothpaste off the floor when she fell on her bottom. She reports her husband helped her up. She reports she was not injured due to her fall.                                                                                                                         SUBJECTIVE STATEMENT: Pt accompanied by: self  PERTINENT HISTORY: Recent falls   PAIN:  09/12/2024: 0/10 pain in the R hip but wearing patches and took a tylenol  earlier (7-8AM) Are you having pain? Yes: NPRS scale: 0 Pain location: L hip Pain description: Sharp   Aggravating factors: Standing up Relieving factors: Positional changes/ Tylenol    PRECAUTIONS: Fall  RED FLAGS: None   WEIGHT BEARING RESTRICTIONS: No  FALLS: Has patient fallen in last 6 months? Yes. Number of falls 1  LIVING ENVIRONMENT: Lives with: lives with their spouse Lives in: House/apartment Stairs: Yes: External: 3 steps; on right going up and on left going up on back porch. Level entry from front of home. Has following equipment at home: Single point cane and Walker - 2 wheeled  PLOF: Independent with ADLs/household chores   PATIENT GOALS: Patient reports her goals include improved balance with walking and to be able to walk dogs with decreased risk of falls. She also wishes to walk without AD eventually.  OBJECTIVE:  Note: Objective measures were completed at Evaluation unless otherwise noted.  DIAGNOSTIC FINDINGS: None BP: 140/96 HR: 96 COGNITION: Overall cognitive status: Within functional limits for tasks assessed   SENSATION: Patient reports her toes are numb.   COORDINATION: Mild coordination deficits noted throughout treatment with gait/transfers.  EDEMA:  Not tested  MUSCLE TONE: Not tested  MUSCLE LENGTH: Not tested   DTRs:  Not tested  POSTURE: rounded shoulders and forward head  LOWER EXTREMITY ROM:    WFL bilaterally.   LOWER EXTREMITY MMT:    MMT  Right Eval Left Eval  Hip flexion 4 4  Hip extension    Hip abduction 4 4-  Hip adduction    Hip internal rotation 4+ 4+  Hip external rotation 4 4  Knee flexion 4 4  Knee extension 5 5  Ankle dorsiflexion 5 5  Ankle plantarflexion    Ankle inversion    Ankle eversion    (Blank rows = not tested)  BED MOBILITY:  Findings: Independent  TRANSFERS: Independent, uses upper extremities  with sit to stand and stand pivot transfers.  RAMP:  Not tested  CURB:  Not tested  STAIRS: Not tested, reports step-to pattern with 2 feet on each step while using bilateral railing. GAIT: Findings: Gait Characteristics: decreased stance time- Left, decreased stride length, and antalgic, Distance walked: 50', Assistive device utilized:Single point cane, and Level of assistance: SBA  BALANCE TESTS:  Romberg: WFL Romberg w/ EC: slight increase in sway.  Tandem Stance: Able to maintain balance for 2'' prior to using UE support to regain balance.  FUNCTIONAL TESTS:  5 times sit to stand: 23.2'' (modified with UE use). Timed up and go (TUG): 24.69 with SPC 6 minute walk test: Test at next treatment session. 10 meter walk test: 16''; 0.63 m/s with SPC. BERG: Test at next treatment session.  PATIENT SURVEYS:  LEFS  Extreme difficulty/unable (0), Quite a bit of difficulty (1), Moderate difficulty (2), Little difficulty (3), No difficulty (4) Survey date:    Any of your usual work, housework or school activities 1  2. Usual hobbies, recreational or sporting activities 0  3. Getting into/out of the bath 0  4. Walking between rooms 3  5. Putting on socks/shoes 3  6. Squatting  1  7. Lifting an object, like a bag of groceries from the floor 2  8. Performing light activities around your home 2  9. Performing heavy activities around your home 1  10. Getting into/out of a car 3  11. Walking 2 blocks 0  12. Walking 1 mile 0  13. Going up/down 10 stairs (1 flight) 0  14. Standing for 1 hour 0   15.  sitting for 1 hour 0  16. Running on even ground 0  17. Running on uneven ground 0  18. Making sharp turns while running fast 0  19. Hopping  0  20. Rolling over in bed 3  Score total:  20/80                                                                                                                                 EVALUATION DATE: 09/01/2024   TODAY'S TREATMENT: 09/19/2024   Gait Training:  452ft ambulation with 2 lb. AWs, 3 laps, no AD used, CGA 2x with rest break between.    TherAct: To improve functional movements patterns for everyday tasks  STS x10 with 1 UE for assist. STS x10 with airex pad placed under buttocks for increased height  Standing marches with 2 lb. AW x15 each in // bars.  Standing hip abduction with 2 lb. AW 2x10 in // bars.   Ambulation over obstacle course 2 airex pads, 2 (6'') hurdles, and 1 (6) step. X3 laps with step to pattern and x3 with reciprocating pattern.   Step-ups 2x10 each 6'' steps without UE support.   PATIENT EDUCATION: Education details: Patient educated on proper exercise mechanics and techniques to improve LE strength. She was also educated on  proper techniques for STS transfers for improved safety/effeciency. Person educated: Patient Education method: Explanation, Demonstration, Verbal cues, and Handouts Education comprehension: verbalized understanding and returned demonstration  HOME EXERCISE PROGRAM: Access Code: [email or web address] URL: [email or web address] Date: 09/01/2024 Prepared by: Sidra Simpers  Exercises - Seated Hip Abduction with Resistance  - 1 x daily - 7 x weekly - 3 sets - 10 reps - Sit to Stand with Arms Crossed  - 1 x daily - 7 x weekly - 3 sets - 10 reps - Seated March  - 1 x daily - 7 x weekly - 3 sets - 10 reps  GOALS: Goals reviewed with patient? Yes  SHORT TERM GOALS: Target date: 10/13/2024   1.  Patient (> 1 years old) will complete five times sit to stand test in < 18 seconds  indicating an increased LE strength and improved balance. Baseline: 16'' with UE use Goal status: INITIAL  2.  Patient will increase Berg Balance score by > 4 points to demonstrate decreased fall risk during functional activities. Baseline: 40/56 Goal status: INITIAL   3.  Patient will reduce timed up and go to <18 seconds to reduce fall risk and demonstrate improved transfer/gait ability. Baseline: 24.6'' with SPC Goal status: INITIAL  4.  Patient will demonstrate independence with current HEP showing proper mechanics/positioning with exercises allowing her to perform HEP at home with improved safety.   BASELINE: HEP initiated at evaluation with handout/education.  Goal status: INITIAL   LONG TERM GOALS: Target date: 11/24/2024  1.  Patient (> 17 years old) will complete five times sit to stand test in < 15 seconds indicating an increased LE strength and improved balance. Baseline: 64'' with UE use Goal status: INITIAL  2.  Patient will increase Berg Balance score by > 6 points to demonstrate decreased fall risk during functional activities. Baseline: 40/56 Goal status: INITIAL   3.  Patient will reduce timed up and go to <13 seconds to reduce fall risk and demonstrate improved transfer/gait ability. Baseline: 24.6 with SPC Goal status: INITIAL  4.  Patient will demonstrate tandem stance for 15'' without UE for balance recovery by discharge allowing her decreased risk for falls. 0 Baseline: Patient able to perform tandem stance for 2'' prior to LOB and need for UE support. Goal status: INITIAL  5.  Patient will increase six minute walk test distance to >1000 for progression to community ambulator and improve gait ability Baseline: 458ft but limited to 2:24 min due to pain in the hip.  Goal status: INITIAL    ASSESSMENT:  CLINICAL IMPRESSION:  Pt began today's treatment with ambulating 450' with 2 lb. AW for improved gait mechanics, distance, and overall endurance. Pt did  become fatigued fairly quickly with gait resulting in request to sit for break.  Sit to stands performed to improve patient's ability to perform transfers at home. Marches and standing hip abduction performed to improve LE strength. Step-ups performed to improve efficiency/safety when performing steps in community. Patient finished today's treatment session with ambulating over uneven surfaces to improve dynamic balance/safety when walking on uneven surfaces at home and in community. Overall, decreased LE strength/balance still noted during today's treatment session, however, she is highly motivated to improve and will benefit from continuing PT services at this time.   OBJECTIVE IMPAIRMENTS: Abnormal gait, decreased activity tolerance, decreased balance, decreased coordination, decreased endurance, decreased mobility, difficulty walking, decreased strength, and pain.   ACTIVITY LIMITATIONS: walking, standing, squatting, stairs, and transfers   PARTICIPATION LIMITATIONS: meal prep,  cleaning, laundry, driving, and community activity  PERSONAL FACTORS: Age, Past/current experiences, and 1-2 comorbidities: CAD, HTN, PAD are also affecting patient's functional outcome.   REHAB POTENTIAL: Good  CLINICAL DECISION MAKING: Stable/uncomplicated  EVALUATION COMPLEXITY: Moderate  PLAN:  PT FREQUENCY: 1-2x/week  PT DURATION: 12 weeks  PLANNED INTERVENTIONS: 97164- PT Re-evaluation, 97750- Physical Performance Testing, 97110-Therapeutic exercises, 97530- Therapeutic activity, 97112- Neuromuscular re-education, 97535- Self Care, [id number]- Manual therapy, [phone]- Gait training, Patient/Family education, Balance training, Vestibular training, DME instructions, Cryotherapy, and Moist heat  PLAN FOR NEXT SESSION:  Continue working to improve LE strength/dynamic balance and independence with gait with progressive exercises to patient's tolerance level.    Norman Sharps, PT, DPT Physical Therapist - [facility]   09/19/24, 1:13 PM

## 2024-09-19 NOTE — Progress Notes (Unsigned)
 Cardiology Clinic Note   Date: 09/19/2024 ID: Verdean, Murin Dec 24, 1950, MRN [id number]  Primary Cardiologist:  Evalene Lunger, MD  Chief Complaint   RODINA PINALES is a 73 y.o. female who presents to the clinic today for ***  Patient Profile   JEANNELLE WIENS is followed by *** for the history outlined below.      Past medical history significant for: Coronary artery calcification. CT chest (lung cancer screening) 11/10/2022: Aortic atherosclerosis.  Three-vessel coronary artery calcification. Carotid artery stenosis. Carotid duplex 05/04/2023: Moderate bilateral atherosclerotic plaque, L>R, resulting in hemodynamically significant stenosis within either ICA.  Retrograde flow within the left vertebral artery changed compared to March 2021, nonspecific though could be seen in the setting of subclavian steal.  Correlation for posterior fossa symptoms please follow-up asymmetry between bilateral upper extremity blood pressure measurements as advised.  Pain.  Blood within the right vertebral artery. PAD. Vascular ultrasound aorta/IVC/iliacs and ABI 08/05/2023: Right ABI indicates severe right lower extremity arterial disease.  Left ABI indicates moderate left lower extremity arterial disease.  Abnormal TBI bilaterally.  No evidence of AAA.  Right external iliac > 50% stenosis. Abdominal aortogram 08/18/2023: No intervention performed.  Recommendation for bilateral femoral endarterectomies with angioplasty and stent placement of the superficial femoral arteries bilaterally. Bilateral common femoral and superficial femoral endarterectomy 09/16/2023. Hypertension. Hyperlipidemia. Lipid panel 03/30/2024: LDL 64, HDL 98 0, TG 117, total 180. Chronic cerebral ischemia. MRI brain/IAC 05/06/2023: No evidence of acute intracranial abnormality.  No cerebellopontine angle or internal auditory canal mass.  Chronic small vessel ischemic changes within the cerebral white matter moderate for age and  progressed from prior brain MRI October 2019.  Mild generalized cerebral atrophy.  Paranasal sinus disease. Tobacco abuse. GAD.  In summary, patient was first evaluated by Dr. Gollan on 02/22/2018 for hypertension at the request of Rollene Northern, FNP.  She has a history of PAD, carotid artery disease, chronic cerebral ischemia for which she is followed by vascular surgery.   Patient was last seen in the office by me on 09/14/2023 for preop evaluation.  She was doing well at that time with no cardiac complaints.  She was pending vascular surgery for lower extremity intervention.  She was found to be an acceptable risk to proceed with surgery.     History of Present Illness    Today, patient ***  Coronary artery calcification Seen on CT chest for lung cancer screening January 2024.  Patient denies chest pain, pressure or tightness.*** - Continue aspirin , atorvastatin , Zetia .   PAD S/p bilateral common femoral and superficial femoral endarterectomy 09/16/2023.  Patient***PT/DP pulses not palpable.  - Continue to follow with vascular surgery. - Continue aspirin , atorvastatin , Zetia .   Hypertension BP today ***. No dizziness or headaches reported.  - Continue amlodipine .   Hyperlipidemia LDL 64 June 2025, at goal. - Continue atorvastatin , Zetia .   Tobacco abuse Patient continues to smoke less than a pack per day. She would like to quit but is finding it difficult to do so secondary to increased stress.*** -Continue to cut back until complete cessation.   ROS: All other systems reviewed and are otherwise negative except as noted in History of Present Illness.  EKGs/Labs Reviewed        03/30/2024: ALT 22; AST 31; BUN 8; Creatinine, Ser 0.85; Potassium 4.7; Sodium 138   03/30/2024: Hemoglobin 12.4; WBC 8.0   03/30/2024: TSH 2.00   No results found for requested labs within last 365 days.  ***  Risk Assessment/Calculations    {Does this patient have ATRIAL  FIBRILLATION?[phone]} No BP recorded.  {Refresh Note OR Click here to enter BP  :1}***        Physical Exam    VS:  There were no vitals taken for this visit. , BMI There is no height or weight on file to calculate BMI.  GEN: Well nourished, well developed, in no acute distress. Neck: No JVD or carotid bruits. Cardiac: *** RRR. *** No murmur. No rubs or gallops.   Respiratory:  Respirations regular and unlabored. Clear to auscultation without rales, wheezing or rhonchi. GI: Soft, nontender, nondistended. Extremities: Radials/DP/PT 2+ and equal bilaterally. No clubbing or cyanosis. No edema ***  Skin: Warm and dry, no rash. Neuro: Strength intact.  Assessment & Plan   ***  Disposition: ***     {Are you ordering a CV Procedure (e.g. stress test, cath, DCCV, TEE, etc)?   Press F2        :[id number]}   Signed, Barnie HERO. Cyncere Sontag, DNP, NP-C

## 2024-09-21 ENCOUNTER — Ambulatory Visit

## 2024-09-21 DIAGNOSIS — R296 Repeated falls: Secondary | ICD-10-CM

## 2024-09-21 LAB — VAS US ABI WITH/WO TBI
Left ABI: 0.87
Right ABI: 1.02

## 2024-09-21 NOTE — Therapy (Signed)
 [facility]AT[facility]   Patient Name: Wanda Clayton MRN: [id number] DOB:[birth year], 73 y.o., female Today's Date: 09/21/2024   PCP: Dineen Rollene MATSU, FNP  REFERRING PROVIDER: Abbey Bruckner, MD  END OF SESSION:  PT End of Session - 09/21/24 1130     Visit Number 6    Number of Visits 25    Date for Recertification  11/24/24    PT Start Time 1130    PT Stop Time 1215    PT Time Calculation (min) 45 min    Equipment Utilized During Treatment Gait belt    Activity Tolerance Patient tolerated treatment well;Patient limited by fatigue    Behavior During Therapy [facility] for tasks assessed/performed         Past Medical History:  Diagnosis Date   Acute pain of left foot 02/08/2020   Anxiety    Coronary artery disease    Depression    Dyspnea    Elevated liver enzymes 11/28/2020   High cholesterol    Hypertension    Peripheral vascular disease    Smoker 02/21/2018   Past Surgical History:  Procedure Laterality Date   BUNIONECTOMY Bilateral    carpal tunnel repair     COLONOSCOPY WITH PROPOFOL  N/A 08/19/2021   Procedure: COLONOSCOPY WITH PROPOFOL ;  Surgeon: Unk Corinn Skiff, MD;  Location: [facility] ENDOSCOPY;  Service: Gastroenterology;  Laterality: N/A;   DENTAL SURGERY     ENDARTERECTOMY FEMORAL Bilateral 09/16/2023   Procedure: ENDARTERECTOMY FEMORAL (BILATERAL SFA STENTS);  Surgeon: Jama Cordella MATSU, MD;  Location: [facility] ORS;  Service: Vascular;  Laterality: Bilateral;   FOOT SURGERY Right    LOWER EXTREMITY ANGIOGRAPHY Right 08/18/2023   Procedure: Lower Extremity Angiography;  Surgeon: Jama Cordella MATSU, MD;  Location: [facility] INVASIVE CV LAB;  Service: Cardiovascular;  Laterality: Right;   TUBAL LIGATION     VAGINAL DELIVERY     x1   Patient Active Problem List   Diagnosis Date Noted   Dizziness 08/16/2024   Recurrent falls 08/08/2024   Laceration of skin of elbow, right, sequela 08/08/2024   Displaced fracture of distal phalanx of left thumb,  initial encounter for closed fracture 08/05/2024   Abrasion of right upper arm 08/05/2024   Tachycardia 02/24/2024   Rash 11/23/2023   Lung nodule 10/22/2023   Wound dehiscence 10/22/2023   Atherosclerosis of artery of extremity with rest pain (HCC) 09/16/2023   B12 deficiency 09/01/2023   Atherosclerosis of native arteries of extremity with rest pain (HCC) 09/01/2023   Elevated MCV 05/27/2023   Chronic cerebral ischemia 05/10/2023   Discoloration of skin of lower leg 04/15/2023   Fatigue 04/01/2023   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   Hepatic steatosis 07/28/2022   Blood in stool    Polyp of ascending colon    Elevated liver enzymes 11/28/2020   Closed fracture of fifth metatarsal bone of left foot 02/08/2020   Carotid atherosclerosis, bilateral 01/11/2020   Trouble swallowing 11/30/2019   Family history of colon cancer 09/01/2018   Coronary artery calcification seen on CT scan 02/22/2018   Claudication in peripheral vascular disease 02/22/2018   Smoker 02/21/2018   Vertigo 01/06/2018   Atherosclerosis of aorta 11/26/2017   Hardening of the aorta (main artery of the heart) 11/26/2017   Osteopenia determined by x-ray 09/16/2017   Colitis, collagenous 05/22/2016   Chronic diarrhea 12/18/2015   Tremor 12/18/2015   Noninfective gastroenteritis and colitis 12/18/2015   Hearing loss 06/20/2015   Hyperlipidemia 06/20/2015   Routine general medical examination  at a health care facility 06/01/2014   Ganglion cyst of wrist 02/02/2013   HTN (hypertension) 01/19/2012   Elevated blood-pressure reading without diagnosis of hypertension 01/19/2012   Atrophic vaginitis 12/11/2011   GAD (generalized anxiety disorder) 12/11/2011   Dysthymic disorder 12/11/2011    ONSET DATE: 08/05/2024  REFERRING DIAG: R29.6 (ICD-10-CM) - Recurrent falls   THERAPY DIAG:  Repeated falls  Rationale for Evaluation and Treatment: Rehabilitation  SUBJECTIVE:   Pt reports that she is doing well today.  She reports she does have a headache today which she does get often due to sinus issues. Denies having any falls since previous visit with no significant changes otherwise.     SUBJECTIVE STATEMENT: Pt accompanied by: self  PERTINENT HISTORY: Recent falls   PAIN:  09/12/2024: 0/10 pain in the R hip but wearing patches and took a tylenol  earlier (7-8AM) Are you having pain? Yes: NPRS scale: 0 Pain location: L hip Pain description: Sharp   Aggravating factors: Standing up Relieving factors: Positional changes/ Tylenol    PRECAUTIONS: Fall  RED FLAGS: None   WEIGHT BEARING RESTRICTIONS: No  FALLS: Has patient fallen in last 6 months? Yes. Number of falls 1  LIVING ENVIRONMENT: Lives with: lives with their spouse Lives in: House/apartment Stairs: Yes: External: 3 steps; on right going up and on left going up on back porch. Level entry from front of home. Has following equipment at home: Single point cane and Walker - 2 wheeled  PLOF: Independent with ADLs/household chores   PATIENT GOALS: Patient reports her goals include improved balance with walking and to be able to walk dogs with decreased risk of falls. She also wishes to walk without AD eventually.  OBJECTIVE:  Note: Objective measures were completed at Evaluation unless otherwise noted.  DIAGNOSTIC FINDINGS: None BP: 140/96 HR: 96 COGNITION: Overall cognitive status: Within functional limits for tasks assessed   SENSATION: Patient reports her toes are numb.   COORDINATION: Mild coordination deficits noted throughout treatment with gait/transfers.  EDEMA:  Not tested  MUSCLE TONE: Not tested  MUSCLE LENGTH: Not tested   DTRs:  Not tested  POSTURE: rounded shoulders and forward head  LOWER EXTREMITY ROM:    WFL bilaterally.   LOWER EXTREMITY MMT:    MMT Right Eval Left Eval  Hip flexion 4 4  Hip extension    Hip abduction 4 4-  Hip adduction    Hip internal rotation 4+ 4+  Hip external  rotation 4 4  Knee flexion 4 4  Knee extension 5 5  Ankle dorsiflexion 5 5  Ankle plantarflexion    Ankle inversion    Ankle eversion    (Blank rows = not tested)  BED MOBILITY:  Findings: Independent  TRANSFERS: Independent, uses upper extremities with sit to stand and stand pivot transfers.  RAMP:  Not tested  CURB:  Not tested  STAIRS: Not tested, reports step-to pattern with 2 feet on each step while using bilateral railing. GAIT: Findings: Gait Characteristics: decreased stance time- Left, decreased stride length, and antalgic, Distance walked: 50', Assistive device utilized:Single point cane, and Level of assistance: SBA  BALANCE TESTS:  Romberg: WFL Romberg w/ EC: slight increase in sway.  Tandem Stance: Able to maintain balance for 2'' prior to using UE support to regain balance.  FUNCTIONAL TESTS:  5 times sit to stand: 23.2'' (modified with UE use). Timed up and go (TUG): 24.69 with SPC 6 minute walk test: Test at next treatment session. 10 meter walk test: 16'';  0.63 m/s with SPC. BERG: Test at next treatment session.  PATIENT SURVEYS:  LEFS  Extreme difficulty/unable (0), Quite a bit of difficulty (1), Moderate difficulty (2), Little difficulty (3), No difficulty (4) Survey date:    Any of your usual work, housework or school activities 1  2. Usual hobbies, recreational or sporting activities 0  3. Getting into/out of the bath 0  4. Walking between rooms 3  5. Putting on socks/shoes 3  6. Squatting  1  7. Lifting an object, like a bag of groceries from the floor 2  8. Performing light activities around your home 2  9. Performing heavy activities around your home 1  10. Getting into/out of a car 3  11. Walking 2 blocks 0  12. Walking 1 mile 0  13. Going up/down 10 stairs (1 flight) 0  14. Standing for 1 hour 0  15.  sitting for 1 hour 0  16. Running on even ground 0  17. Running on uneven ground 0  18. Making sharp turns while running fast 0  19.  Hopping  0  20. Rolling over in bed 3  Score total:  20/80                                                                                                                                 EVALUATION DATE: 09/01/2024   TODAY'S TREATMENT: 09/21/2024   Gait Training:  Warm up with 418ft ambulation with 2 lb. AWs, 3 laps, no AD used, CGA without rest break.   Cool down with 427ft ambulation without AWs, 3 laps, no AD used, CGA without rest break.    TherAct: To improve functional movements patterns for everyday tasks  STS with airex pad placed on chair. 2x10 while holding 2 kg med. Ball.   Step-ups 2x10 each 6'' steps without UE support.   NMS:  Standing NBOS on airex with EC 4x30''.  Standing vectors x3 spikey cones 10x each LE at balance station for improved dynamic balance.    Standing marches with 2 lb. AW x15 each in // bars.   Standing marches on airex x30.   PATIENT EDUCATION: Education details: Patient educated on proper exercise mechanics and techniques to improve LE strength. She was also educated on proper techniques for STS transfers for improved safety/effeciency. Person educated: Patient Education method: Explanation, Demonstration, Verbal cues, and Handouts Education comprehension: verbalized understanding and returned demonstration  HOME EXERCISE PROGRAM: Access Code: [email or web address] URL: [email or web address] Date: 09/01/2024 Prepared by: Sidra Simpers  Exercises - Seated Hip Abduction with Resistance  - 1 x daily - 7 x weekly - 3 sets - 10 reps - Sit to Stand with Arms Crossed  - 1 x daily - 7 x weekly - 3 sets - 10 reps - Seated March  - 1 x daily - 7 x weekly - 3 sets - 10 reps  GOALS: Goals reviewed with patient? Yes  SHORT TERM GOALS:  Target date: 10/13/2024   1.  Patient (> 29 years old) will complete five times sit to stand test in < 18 seconds indicating an increased LE strength and improved balance. Baseline: 54'' with UE use Goal  status: INITIAL  2.  Patient will increase Berg Balance score by > 4 points to demonstrate decreased fall risk during functional activities. Baseline: 40/56 Goal status: INITIAL   3.  Patient will reduce timed up and go to <18 seconds to reduce fall risk and demonstrate improved transfer/gait ability. Baseline: 24.6'' with SPC Goal status: INITIAL  4.  Patient will demonstrate independence with current HEP showing proper mechanics/positioning with exercises allowing her to perform HEP at home with improved safety.   BASELINE: HEP initiated at evaluation with handout/education.  Goal status: INITIAL   LONG TERM GOALS: Target date: 11/24/2024  1.  Patient (> 60 years old) will complete five times sit to stand test in < 15 seconds indicating an increased LE strength and improved balance. Baseline: 78'' with UE use Goal status: INITIAL  2.  Patient will increase Berg Balance score by > 6 points to demonstrate decreased fall risk during functional activities. Baseline: 40/56 Goal status: INITIAL   3.  Patient will reduce timed up and go to <13 seconds to reduce fall risk and demonstrate improved transfer/gait ability. Baseline: 24.6 with SPC Goal status: INITIAL  4.  Patient will demonstrate tandem stance for 15'' without UE for balance recovery by discharge allowing her decreased risk for falls. 0 Baseline: Patient able to perform tandem stance for 2'' prior to LOB and need for UE support. Goal status: INITIAL  5.  Patient will increase six minute walk test distance to >1000 for progression to community ambulator and improve gait ability Baseline: 428ft but limited to 2:24 min due to pain in the hip.  Goal status: INITIAL    ASSESSMENT:  CLINICAL IMPRESSION:  Pt began today's treatment ambulating 450' with 2 lb. AW for improved gait speed, gait endurance, and cardiovascular response. She did improve today with no rest breaks during her walk. Balance activities performed using  airex and included marching as well as Romberg stance with EC. Patient was noted to have improvement in balance with practice during exercises. She also performed vectors in forward, diagonal, and lateral directions for improved dynamic balance. Sit to stands and step-ups performed to improve patients ability to perform everyday activities. She finished tx with a second prolonged walk and did so without a rest break again. Overall, decreased balance, decreased endurance, decreased activity tolerance, and decreased LE strength still noted throughout treatment. She would benefit from continuing PT treatments at this time to improve on deficits listed above and to work to meet her goals.   OBJECTIVE IMPAIRMENTS: Abnormal gait, decreased activity tolerance, decreased balance, decreased coordination, decreased endurance, decreased mobility, difficulty walking, decreased strength, and pain.   ACTIVITY LIMITATIONS: walking, standing, squatting, stairs, and transfers   PARTICIPATION LIMITATIONS: meal prep, cleaning, laundry, driving, and community activity  PERSONAL FACTORS: Age, Past/current experiences, and 1-2 comorbidities: CAD, HTN, PAD are also affecting patient's functional outcome.   REHAB POTENTIAL: Good  CLINICAL DECISION MAKING: Stable/uncomplicated  EVALUATION COMPLEXITY: Moderate  PLAN:  PT FREQUENCY: 1-2x/week  PT DURATION: 12 weeks  PLANNED INTERVENTIONS: 97164- PT Re-evaluation, 97750- Physical Performance Testing, 97110-Therapeutic exercises, 97530- Therapeutic activity, [id number]- Neuromuscular re-education, 97535- Self Care, [id number]- Manual therapy, [id number]- Gait training, Patient/Family education, Balance training, Vestibular training, DME instructions, Cryotherapy, and Moist heat  PLAN FOR NEXT SESSION:  Continue working to improve LE strength/dynamic balance and independence with gait with progressive exercises to patient's tolerance level.    Norman Sharps, PT, DPT Physical  Therapist - [facility]   09/21/24, 12:25 PM

## 2024-09-22 ENCOUNTER — Ambulatory Visit
Admission: RE | Admit: 2024-09-22 | Discharge: 2024-09-22 | Disposition: A | Discharge status: Home / Self Care | Source: Ambulatory Visit | Attending: Acute Care | Admitting: Acute Care

## 2024-09-22 DIAGNOSIS — F17210 Nicotine dependence, cigarettes, uncomplicated: Secondary | ICD-10-CM | POA: Diagnosis present

## 2024-09-22 DIAGNOSIS — D3502 Benign neoplasm of left adrenal gland: Secondary | ICD-10-CM | POA: Insufficient documentation

## 2024-09-22 DIAGNOSIS — I2510 Atherosclerotic heart disease of native coronary artery without angina pectoris: Secondary | ICD-10-CM | POA: Insufficient documentation

## 2024-09-22 DIAGNOSIS — J439 Emphysema, unspecified: Secondary | ICD-10-CM | POA: Insufficient documentation

## 2024-09-22 DIAGNOSIS — I700 Atherosclerosis of aorta: Secondary | ICD-10-CM | POA: Insufficient documentation

## 2024-09-22 DIAGNOSIS — Z87891 Personal history of nicotine dependence: Secondary | ICD-10-CM | POA: Diagnosis present

## 2024-09-22 DIAGNOSIS — Z122 Encounter for screening for malignant neoplasm of respiratory organs: Secondary | ICD-10-CM | POA: Insufficient documentation

## 2024-09-23 ENCOUNTER — Encounter: Payer: Self-pay | Admitting: Student

## 2024-09-23 ENCOUNTER — Ambulatory Visit: Attending: Student | Admitting: Student

## 2024-09-23 VITALS — BP 142/70 | HR 98 | Resp 22 | Ht 63.0 in | Wt 142.4 lb

## 2024-09-23 DIAGNOSIS — Z720 Tobacco use: Secondary | ICD-10-CM

## 2024-09-23 DIAGNOSIS — I739 Peripheral vascular disease, unspecified: Secondary | ICD-10-CM | POA: Diagnosis not present

## 2024-09-23 DIAGNOSIS — E785 Hyperlipidemia, unspecified: Secondary | ICD-10-CM

## 2024-09-23 DIAGNOSIS — I10 Essential (primary) hypertension: Secondary | ICD-10-CM

## 2024-09-23 DIAGNOSIS — I2510 Atherosclerotic heart disease of native coronary artery without angina pectoris: Secondary | ICD-10-CM

## 2024-09-23 NOTE — Patient Instructions (Signed)
 Medication Instructions:  Your physician recommends that you continue on your current medications as directed. Please refer to the Current Medication list given to you today.    *If you need a refill on your cardiac medications before your next appointment, please call your pharmacy*  Lab Work: No labs ordered today    Testing/Procedures: No test ordered today   Follow-Up: At [facility], you and your health needs are our priority.  As part of our continuing mission to provide you with exceptional heart care, our providers are all part of one team.  This team includes your primary Cardiologist (physician) and Advanced Practice Providers or APPs (Physician Assistants and Nurse Practitioners) who all work together to provide you with the care you need, when you need it.  Your next appointment:   1 year(s)  Provider:   Evalene Lunger, MD or Barnie Hila, NP

## 2024-09-26 ENCOUNTER — Ambulatory Visit: Admitting: Physical Therapy

## 2024-09-26 ENCOUNTER — Encounter: Payer: Self-pay | Admitting: Physical Therapy

## 2024-09-26 DIAGNOSIS — R296 Repeated falls: Secondary | ICD-10-CM

## 2024-09-26 NOTE — Therapy (Unsigned)
 [facility]   Patient Name: GUISELLE MIAN MRN: [id number] DOB:12/09/1950, 73 y.o., female Today's Date: 09/26/2024   PCP: Dineen Rollene MATSU, FNP  REFERRING PROVIDER: Abbey Bruckner, MD  END OF SESSION:  PT End of Session - 09/26/24 1447     Visit Number 7    Number of Visits 25    Date for Recertification  11/24/24    PT Start Time 1448    PT Stop Time 1530    PT Time Calculation (min) 42 min    Equipment Utilized During Treatment Gait belt    Activity Tolerance Patient tolerated treatment well;Patient limited by fatigue    Behavior During Therapy [facility] for tasks assessed/performed         Past Medical History:  Diagnosis Date   Acute pain of left foot 02/08/2020   Anxiety    Coronary artery disease    Depression    Dyspnea    Elevated liver enzymes 11/28/2020   High cholesterol    Hypertension    Peripheral vascular disease    Smoker 02/21/2018   Past Surgical History:  Procedure Laterality Date   BUNIONECTOMY Bilateral    carpal tunnel repair     COLONOSCOPY WITH PROPOFOL  N/A 08/19/2021   Procedure: COLONOSCOPY WITH PROPOFOL ;  Surgeon: Unk Corinn Skiff, MD;  Location: [facility] ENDOSCOPY;  Service: Gastroenterology;  Laterality: N/A;   DENTAL SURGERY     ENDARTERECTOMY FEMORAL Bilateral 09/16/2023   Procedure: ENDARTERECTOMY FEMORAL (BILATERAL SFA STENTS);  Surgeon: Jama Cordella MATSU, MD;  Location: [facility] ORS;  Service: Vascular;  Laterality: Bilateral;   FOOT SURGERY Right    LOWER EXTREMITY ANGIOGRAPHY Right 08/18/2023   Procedure: Lower Extremity Angiography;  Surgeon: Jama Cordella MATSU, MD;  Location: [facility] INVASIVE CV LAB;  Service: Cardiovascular;  Laterality: Right;   TUBAL LIGATION     VAGINAL DELIVERY     x1   Patient Active Problem List   Diagnosis Date Noted   Dizziness 08/16/2024   Recurrent falls 08/08/2024   Laceration of skin of elbow, right, sequela 08/08/2024   Displaced fracture of distal phalanx of left thumb,  initial encounter for closed fracture 08/05/2024   Abrasion of right upper arm 08/05/2024   Tachycardia 02/24/2024   Rash 11/23/2023   Lung nodule 10/22/2023   Wound dehiscence 10/22/2023   Atherosclerosis of artery of extremity with rest pain (HCC) 09/16/2023   B12 deficiency 09/01/2023   Atherosclerosis of native arteries of extremity with rest pain (HCC) 09/01/2023   Elevated MCV 05/27/2023   Chronic cerebral ischemia 05/10/2023   Discoloration of skin of lower leg 04/15/2023   Fatigue 04/01/2023   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   Hepatic steatosis 07/28/2022   Blood in stool    Polyp of ascending colon    Elevated liver enzymes 11/28/2020   Closed fracture of fifth metatarsal bone of left foot 02/08/2020   Carotid atherosclerosis, bilateral 01/11/2020   Trouble swallowing 11/30/2019   Family history of colon cancer 09/01/2018   Coronary artery calcification seen on CT scan 02/22/2018   Claudication in peripheral vascular disease 02/22/2018   Smoker 02/21/2018   Vertigo 01/06/2018   Atherosclerosis of aorta 11/26/2017   Hardening of the aorta (main artery of the heart) 11/26/2017   Osteopenia determined by x-ray 09/16/2017   Colitis, collagenous 05/22/2016   Chronic diarrhea 12/18/2015   Tremor 12/18/2015   Noninfective gastroenteritis and colitis 12/18/2015   Hearing loss 06/20/2015   Hyperlipidemia 06/20/2015   Routine general medical examination  at a health care facility 06/01/2014   Ganglion cyst of wrist 02/02/2013   HTN (hypertension) 01/19/2012   Elevated blood-pressure reading without diagnosis of hypertension 01/19/2012   Atrophic vaginitis 12/11/2011   GAD (generalized anxiety disorder) 12/11/2011   Dysthymic disorder 12/11/2011    ONSET DATE: 08/05/2024  REFERRING DIAG: R29.6 (ICD-10-CM) - Recurrent falls   THERAPY DIAG:  Repeated falls  Rationale for Evaluation and Treatment: Rehabilitation  SUBJECTIVE:  Reports last week she had multiple  doctor's appointments and is tired. Says she had a lung scan and a cardiologist appointment last week as well. Says she is not happy having to be dependant, and want to be able to walk her dog in the neighborhood.     SUBJECTIVE STATEMENT: Pt accompanied by: self  PERTINENT HISTORY: Recent falls   PAIN:  09/12/2024: 0/10 pain in the R hip but wearing patches and took a tylenol  earlier (7-8AM) Are you having pain? Yes: NPRS scale: 0 Pain location: L hip Pain description: Sharp   Aggravating factors: Standing up Relieving factors: Positional changes/ Tylenol    PRECAUTIONS: Fall  RED FLAGS: None   WEIGHT BEARING RESTRICTIONS: No  FALLS: Has patient fallen in last 6 months? Yes. Number of falls 1  LIVING ENVIRONMENT: Lives with: lives with their spouse Lives in: House/apartment Stairs: Yes: External: 3 steps; on right going up and on left going up on back porch. Level entry from front of home. Has following equipment at home: Single point cane and Walker - 2 wheeled  PLOF: Independent with ADLs/household chores   PATIENT GOALS: Patient reports her goals include improved balance with walking and to be able to walk dogs with decreased risk of falls. She also wishes to walk without AD eventually.  OBJECTIVE:  Note: Objective measures were completed at Evaluation unless otherwise noted.  DIAGNOSTIC FINDINGS: None BP: 140/96 HR: 96 COGNITION: Overall cognitive status: Within functional limits for tasks assessed   SENSATION: Patient reports her toes are numb.   COORDINATION: Mild coordination deficits noted throughout treatment with gait/transfers.  EDEMA:  Not tested  MUSCLE TONE: Not tested  MUSCLE LENGTH: Not tested   DTRs:  Not tested  POSTURE: rounded shoulders and forward head  LOWER EXTREMITY ROM:    WFL bilaterally.   LOWER EXTREMITY MMT:    MMT Right Eval Left Eval  Hip flexion 4 4  Hip extension    Hip abduction 4 4-  Hip adduction    Hip  internal rotation 4+ 4+  Hip external rotation 4 4  Knee flexion 4 4  Knee extension 5 5  Ankle dorsiflexion 5 5  Ankle plantarflexion    Ankle inversion    Ankle eversion    (Blank rows = not tested)  BED MOBILITY:  Findings: Independent  TRANSFERS: Independent, uses upper extremities with sit to stand and stand pivot transfers.  RAMP:  Not tested  CURB:  Not tested  STAIRS: Not tested, reports step-to pattern with 2 feet on each step while using bilateral railing. GAIT: Findings: Gait Characteristics: decreased stance time- Left, decreased stride length, and antalgic, Distance walked: 50', Assistive device utilized:Single point cane, and Level of assistance: SBA  BALANCE TESTS:  Romberg: WFL Romberg w/ EC: slight increase in sway.  Tandem Stance: Able to maintain balance for 2'' prior to using UE support to regain balance.  FUNCTIONAL TESTS:  5 times sit to stand: 23.2'' (modified with UE use). Timed up and go (TUG): 24.69 with SPC 6 minute walk test: Test at  next treatment session. 10 meter walk test: 16''; 0.63 m/s with SPC. BERG: Test at next treatment session.  PATIENT SURVEYS:  LEFS  Extreme difficulty/unable (0), Quite a bit of difficulty (1), Moderate difficulty (2), Little difficulty (3), No difficulty (4) Survey date:    Any of your usual work, housework or school activities 1  2. Usual hobbies, recreational or sporting activities 0  3. Getting into/out of the bath 0  4. Walking between rooms 3  5. Putting on socks/shoes 3  6. Squatting  1  7. Lifting an object, like a bag of groceries from the floor 2  8. Performing light activities around your home 2  9. Performing heavy activities around your home 1  10. Getting into/out of a car 3  11. Walking 2 blocks 0  12. Walking 1 mile 0  13. Going up/down 10 stairs (1 flight) 0  14. Standing for 1 hour 0  15.  sitting for 1 hour 0  16. Running on even ground 0  17. Running on uneven ground 0  18. Making  sharp turns while running fast 0  19. Hopping  0  20. Rolling over in bed 3  Score total:  20/80                                                                                                                                 EVALUATION DATE: 09/01/2024   TODAY'S TREATMENT: 09/26/2024   -Nustep, rolling hill program, level 3-6, seat 9, arms 7, 6 minutes  -430ft ambulation with 2# AWs on B LE, CGA from SPT  -sit <>stand from chair w/ blue foam pad holding 2kg, 3x8, supervision from SPT  -sideways ambulation in // bars with 2# AW on B LE, 3 laps  -High knee marching in // bars w/ 2 # AW on B LE, 3 laps  -419ft ambulation in hallway, 3 laps, supervision assist from SPT  -EO static stance on blue foam pad, 3x30s, CGA-min assist from SPT  -EC static stance on blue foam pad, 3x30s, CGA-min assist from SPT  -Static stance on blue foam pad with thoracic and cervical rotation to L and R, min assist from SPT, x 2   PATIENT EDUCATION: Education details: Patient educated on proper exercise mechanics and techniques to improve LE strength. She was also educated on proper techniques for STS transfers for improved safety/effeciency. Person educated: Patient Education method: Explanation, Demonstration, Verbal cues, and Handouts Education comprehension: verbalized understanding and returned demonstration  HOME EXERCISE PROGRAM: Access Code: [email or web address] URL: [email or web address] Date: 09/01/2024 Prepared by: Sidra Simpers  Exercises - Seated Hip Abduction with Resistance  - 1 x daily - 7 x weekly - 3 sets - 10 reps - Sit to Stand with Arms Crossed  - 1 x daily - 7 x weekly - 3 sets - 10 reps - Seated March  - 1 x daily - 7 x weekly - 3 sets -  10 reps  GOALS: Goals reviewed with patient? Yes  SHORT TERM GOALS: Target date: 10/13/2024   1.  Patient (> 63 years old) will complete five times sit to stand test in < 18 seconds indicating an increased LE strength and improved  balance. Baseline: 40'' with UE use Goal status: INITIAL  2.  Patient will increase Berg Balance score by > 4 points to demonstrate decreased fall risk during functional activities. Baseline: 40/56 Goal status: INITIAL   3.  Patient will reduce timed up and go to <18 seconds to reduce fall risk and demonstrate improved transfer/gait ability. Baseline: 24.6'' with SPC Goal status: INITIAL  4.  Patient will demonstrate independence with current HEP showing proper mechanics/positioning with exercises allowing her to perform HEP at home with improved safety.   BASELINE: HEP initiated at evaluation with handout/education.  Goal status: INITIAL   LONG TERM GOALS: Target date: 11/24/2024  1.  Patient (> 51 years old) will complete five times sit to stand test in < 15 seconds indicating an increased LE strength and improved balance. Baseline: 11'' with UE use Goal status: INITIAL  2.  Patient will increase Berg Balance score by > 6 points to demonstrate decreased fall risk during functional activities. Baseline: 40/56 Goal status: INITIAL   3.  Patient will reduce timed up and go to <13 seconds to reduce fall risk and demonstrate improved transfer/gait ability. Baseline: 24.6 with SPC Goal status: INITIAL  4.  Patient will demonstrate tandem stance for 15'' without UE for balance recovery by discharge allowing her decreased risk for falls. 0 Baseline: Patient able to perform tandem stance for 2'' prior to LOB and need for UE support. Goal status: INITIAL  5.  Patient will increase six minute walk test distance to >1000 for progression to community ambulator and improve gait ability Baseline: 464ft but limited to 2:24 min due to pain in the hip.  Goal status: INITIAL    ASSESSMENT:  CLINICAL IMPRESSION  Pt arrived to session with SPC, but did not utilize it throughout the whole session. Session centered around increasing patient cardiovascular and ambulation tolerance, functional  strength training, and dynamic balance. Pt tolerated all activities with 2# AW well, but continued to show fatigue following ambulation. Pt continued to have difficulty with EC balance activities, and significant difficulty during balance on foam pad with side turning. Pt required a few seated rest breaks due to minor SoB. She states she is feeling a bit stronger, but does not do anything when she arrives home. Pt would continue to benefit from dynamic balance training and overall cardiovascular endurance. Pt will continue to benefit from skilled therapy to address remaining deficits in order to improve overall QoL and return to PLOF.     OBJECTIVE IMPAIRMENTS: Abnormal gait, decreased activity tolerance, decreased balance, decreased coordination, decreased endurance, decreased mobility, difficulty walking, decreased strength, and pain.   ACTIVITY LIMITATIONS: walking, standing, squatting, stairs, and transfers   PARTICIPATION LIMITATIONS: meal prep, cleaning, laundry, driving, and community activity  PERSONAL FACTORS: Age, Past/current experiences, and 1-2 comorbidities: CAD, HTN, PAD are also affecting patient's functional outcome.   REHAB POTENTIAL: Good  CLINICAL DECISION MAKING: Stable/uncomplicated  EVALUATION COMPLEXITY: Moderate  PLAN:  PT FREQUENCY: 1-2x/week  PT DURATION: 12 weeks  PLANNED INTERVENTIONS: 97164- PT Re-evaluation, 97750- Physical Performance Testing, 97110-Therapeutic exercises, 97530- Therapeutic activity, [id number]- Neuromuscular re-education, 97535- Self Care, [id number]- Manual therapy, [id number]- Gait training, Patient/Family education, Balance training, Vestibular training, DME instructions, Cryotherapy, and Moist heat  PLAN FOR  NEXT SESSION:  Continue working to improve LE strength/dynamic balance and independence with gait with progressive exercises to patient's tolerance level.   Renna Helling, SPT  09/26/2024, 2:48 PM

## 2024-09-27 ENCOUNTER — Other Ambulatory Visit: Payer: Self-pay

## 2024-09-27 ENCOUNTER — Other Ambulatory Visit: Payer: Self-pay | Admitting: Family

## 2024-09-27 DIAGNOSIS — Z87891 Personal history of nicotine dependence: Secondary | ICD-10-CM

## 2024-09-27 DIAGNOSIS — Z122 Encounter for screening for malignant neoplasm of respiratory organs: Secondary | ICD-10-CM

## 2024-09-27 DIAGNOSIS — F17210 Nicotine dependence, cigarettes, uncomplicated: Secondary | ICD-10-CM

## 2024-09-27 MED ORDER — CLOPIDOGREL BISULFATE 75 MG PO TABS
75.0000 mg | ORAL_TABLET | Freq: Every day | ORAL | 3 refills | Status: DC
  Filled 2024-09-27: qty 90, 90d supply, fill #0

## 2024-09-28 ENCOUNTER — Ambulatory Visit: Admitting: Physical Therapy

## 2024-09-28 ENCOUNTER — Telehealth: Payer: Self-pay | Admitting: Family

## 2024-09-28 DIAGNOSIS — Q891 Congenital malformations of adrenal gland: Secondary | ICD-10-CM

## 2024-09-28 DIAGNOSIS — R296 Repeated falls: Secondary | ICD-10-CM | POA: Diagnosis not present

## 2024-09-28 NOTE — Telephone Encounter (Signed)
 Spoke to pt discussed results pt verbalized understanding

## 2024-09-28 NOTE — Telephone Encounter (Signed)
° °  Call pt  I received results from your annual CT lung scan from [facility] pulmonology.  It is benign.  It is imperative repeat in 12 months.  It was noted again on the exam that you have coronary artery atherosclerosis, or often referred to as hardening of the arteries.  Please continue close follow-up with cardiology for history of coronary artery calcification   CT chest 09/22/24  Stable left adrenal adenoma, for which no follow-up imaging is recommended.  You had endocrinology consult with Dr. Oma 05/20/2023 Cortisol and DHEA were not elevated Will continue to monitor with annual CT Chest

## 2024-09-28 NOTE — Assessment & Plan Note (Addendum)
 CT chest 09/22/24  Stable left adrenal adenoma, for which no follow-up imaging is recommended.   endocrinology consult with Dr. Oma 05/20/2023 Cortisol and DHEA were not elevated  Will continue to monitor with annual CT Chest

## 2024-09-28 NOTE — Therapy (Signed)
 [facility]REAT[facility]   Patient Name: Wanda Clayton MRN: [id number] DOB:[birth year], 73 y.o., female Today's Date: 09/28/2024   PCP: Dineen Rollene MATSU, FNP  REFERRING PROVIDER: Abbey Bruckner, MD  END OF SESSION:  PT End of Session - 09/28/24 1704     Visit Number 8    Number of Visits 25    Date for Recertification  11/24/24    PT Start Time 1150    PT Stop Time 1230    PT Time Calculation (min) 40 min    Equipment Utilized During Treatment Gait belt    Activity Tolerance Patient tolerated treatment well;Patient limited by fatigue    Behavior During Therapy [facility] for tasks assessed/performed          Past Medical History:  Diagnosis Date   Acute pain of left foot 02/08/2020   Anxiety    Coronary artery disease    Depression    Dyspnea    Elevated liver enzymes 11/28/2020   High cholesterol    Hypertension    Peripheral vascular disease    Smoker 02/21/2018   Past Surgical History:  Procedure Laterality Date   BUNIONECTOMY Bilateral    carpal tunnel repair     COLONOSCOPY WITH PROPOFOL  N/A 08/19/2021   Procedure: COLONOSCOPY WITH PROPOFOL ;  Surgeon: Unk Corinn Skiff, MD;  Location: [facility] ENDOSCOPY;  Service: Gastroenterology;  Laterality: N/A;   DENTAL SURGERY     ENDARTERECTOMY FEMORAL Bilateral 09/16/2023   Procedure: ENDARTERECTOMY FEMORAL (BILATERAL SFA STENTS);  Surgeon: Jama Cordella MATSU, MD;  Location: [facility] ORS;  Service: Vascular;  Laterality: Bilateral;   FOOT SURGERY Right    LOWER EXTREMITY ANGIOGRAPHY Right 08/18/2023   Procedure: Lower Extremity Angiography;  Surgeon: Jama Cordella MATSU, MD;  Location: [facility] INVASIVE CV LAB;  Service: Cardiovascular;  Laterality: Right;   TUBAL LIGATION     VAGINAL DELIVERY     x1   Patient Active Problem List   Diagnosis Date Noted   Dizziness 08/16/2024   Recurrent falls 08/08/2024   Laceration of skin of elbow, right, sequela 08/08/2024   Displaced fracture of distal phalanx of left thumb,  initial encounter for closed fracture 08/05/2024   Abrasion of right upper arm 08/05/2024   Tachycardia 02/24/2024   Rash 11/23/2023   Lung nodule 10/22/2023   Wound dehiscence 10/22/2023   Atherosclerosis of artery of extremity with rest pain (HCC) 09/16/2023   B12 deficiency 09/01/2023   Atherosclerosis of native arteries of extremity with rest pain (HCC) 09/01/2023   Elevated MCV 05/27/2023   Chronic cerebral ischemia 05/10/2023   Discoloration of skin of lower leg 04/15/2023   Fatigue 04/01/2023   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   Hepatic steatosis 07/28/2022   Blood in stool    Polyp of ascending colon    Elevated liver enzymes 11/28/2020   Closed fracture of fifth metatarsal bone of left foot 02/08/2020   Carotid atherosclerosis, bilateral 01/11/2020   Trouble swallowing 11/30/2019   Family history of colon cancer 09/01/2018   Coronary artery calcification seen on CT scan 02/22/2018   Claudication in peripheral vascular disease 02/22/2018   Smoker 02/21/2018   Vertigo 01/06/2018   Atherosclerosis of aorta 11/26/2017   Hardening of the aorta (main artery of the heart) 11/26/2017   Osteopenia determined by x-ray 09/16/2017   Colitis, collagenous 05/22/2016   Chronic diarrhea 12/18/2015   Tremor 12/18/2015   Noninfective gastroenteritis and colitis 12/18/2015   Hearing loss 06/20/2015   Hyperlipidemia 06/20/2015   Routine general medical  examination at a health care facility 06/01/2014   Ganglion cyst of wrist 02/02/2013   HTN (hypertension) 01/19/2012   Elevated blood-pressure reading without diagnosis of hypertension 01/19/2012   Atrophic vaginitis 12/11/2011   GAD (generalized anxiety disorder) 12/11/2011   Dysthymic disorder 12/11/2011    ONSET DATE: 08/05/2024  REFERRING DIAG: R29.6 (ICD-10-CM) - Recurrent falls   THERAPY DIAG:  Repeated falls  Rationale for Evaluation and Treatment: Rehabilitation  SUBJECTIVE:  Reports that she is doing well. Using pain  patches in bil hips today, which has made a huge difference in pain management. No other updates.     SUBJECTIVE STATEMENT: Pt accompanied by: self  PERTINENT HISTORY: Recent falls   PAIN:  09/12/2024: 0/10 pain in the R hip but wearing patches and took a tylenol  earlier (7-8AM) Are you having pain? Yes: NPRS scale: 0 Pain location: L hip Pain description: Sharp   Aggravating factors: Standing up Relieving factors: Positional changes/ Tylenol    PRECAUTIONS: Fall  RED FLAGS: None   WEIGHT BEARING RESTRICTIONS: No  FALLS: Has patient fallen in last 6 months? Yes. Number of falls 1  LIVING ENVIRONMENT: Lives with: lives with their spouse Lives in: House/apartment Stairs: Yes: External: 3 steps; on right going up and on left going up on back porch. Level entry from front of home. Has following equipment at home: Single point cane and Walker - 2 wheeled  PLOF: Independent with ADLs/household chores   PATIENT GOALS: Patient reports her goals include improved balance with walking and to be able to walk dogs with decreased risk of falls. She also wishes to walk without AD eventually.  OBJECTIVE:  Note: Objective measures were completed at Evaluation unless otherwise noted.  DIAGNOSTIC FINDINGS: None BP: 140/96 HR: 96 COGNITION: Overall cognitive status: Within functional limits for tasks assessed   SENSATION: Patient reports her toes are numb.   COORDINATION: Mild coordination deficits noted throughout treatment with gait/transfers.  EDEMA:  Not tested  MUSCLE TONE: Not tested  MUSCLE LENGTH: Not tested   DTRs:  Not tested  POSTURE: rounded shoulders and forward head  LOWER EXTREMITY ROM:    WFL bilaterally.   LOWER EXTREMITY MMT:    MMT Right Eval Left Eval  Hip flexion 4 4  Hip extension    Hip abduction 4 4-  Hip adduction    Hip internal rotation 4+ 4+  Hip external rotation 4 4  Knee flexion 4 4  Knee extension 5 5  Ankle dorsiflexion 5 5   Ankle plantarflexion    Ankle inversion    Ankle eversion    (Blank rows = not tested)  BED MOBILITY:  Findings: Independent  TRANSFERS: Independent, uses upper extremities with sit to stand and stand pivot transfers.  RAMP:  Not tested  CURB:  Not tested  STAIRS: Not tested, reports step-to pattern with 2 feet on each step while using bilateral railing. GAIT: Findings: Gait Characteristics: decreased stance time- Left, decreased stride length, and antalgic, Distance walked: 50', Assistive device utilized:Single point cane, and Level of assistance: SBA  BALANCE TESTS:  Romberg: WFL Romberg w/ EC: slight increase in sway.  Tandem Stance: Able to maintain balance for 2'' prior to using UE support to regain balance.  FUNCTIONAL TESTS:  5 times sit to stand: 23.2'' (modified with UE use). Timed up and go (TUG): 24.69 with SPC 6 minute walk test: Test at next treatment session. 10 meter walk test: 16''; 0.63 m/s with SPC. BERG: Test at next treatment session.  PATIENT  SURVEYS:  LEFS  Extreme difficulty/unable (0), Quite a bit of difficulty (1), Moderate difficulty (2), Little difficulty (3), No difficulty (4) Survey date:    Any of your usual work, housework or school activities 1  2. Usual hobbies, recreational or sporting activities 0  3. Getting into/out of the bath 0  4. Walking between rooms 3  5. Putting on socks/shoes 3  6. Squatting  1  7. Lifting an object, like a bag of groceries from the floor 2  8. Performing light activities around your home 2  9. Performing heavy activities around your home 1  10. Getting into/out of a car 3  11. Walking 2 blocks 0  12. Walking 1 mile 0  13. Going up/down 10 stairs (1 flight) 0  14. Standing for 1 hour 0  15.  sitting for 1 hour 0  16. Running on even ground 0  17. Running on uneven ground 0  18. Making sharp turns while running fast 0  19. Hopping  0  20. Rolling over in bed 3  Score total:  20/80                                                                                                                                  EVALUATION DATE: 09/01/2024   TODAY'S TREATMENT: 09/28/2024   -Nustep, rolling hill program, level 3-6, seat 10, arms 11, 6 minutes. Cues for consistent SPM through varied resistance.   - forward step ups on 4inch aerobic step x 8 bil  - lateral step ups 5 inch areobic step x 10 bil  - sit stand without UE support and airex pad in seat 2 x 10. Utilized forward reach to improve anterior weight shift; noted to have quad dominate sit<>stand strategy. .   -modified single leg good-morning with 7# DB in contralateral UE 2 x 8 bil cues for full erect posture between reps. ipsilateral UE support  on rail.    - matrix resisted gait forward 12.5#, x 4  - matrix resisted side stepping 7.5# x 2 bil.   Sit<>stand without UE support x 8. Improved activation of gluteals noted. Compared to prior bouts of sit<>stand.     PATIENT EDUCATION: Education details: Patient educated on proper exercise mechanics and techniques to improve LE strength. She was also educated on proper techniques for STS transfers for improved safety/effeciency. Person educated: Patient Education method: Explanation, Demonstration, Verbal cues, and Handouts Education comprehension: verbalized understanding and returned demonstration  HOME EXERCISE PROGRAM: Access Code: [email or web address] URL: [email or web address] Date: 09/01/2024 Prepared by: Sidra Simpers  Exercises - Seated Hip Abduction with Resistance  - 1 x daily - 7 x weekly - 3 sets - 10 reps - Sit to Stand with Arms Crossed  - 1 x daily - 7 x weekly - 3 sets - 10 reps - Seated March  - 1 x daily - 7 x weekly - 3 sets - 10 reps  GOALS: Goals reviewed with patient? Yes  SHORT TERM GOALS: Target date: 10/13/2024   1.  Patient (> 1 years old) will complete five times sit to stand test in < 18 seconds indicating an increased LE strength and improved  balance. Baseline: 53'' with UE use Goal status: INITIAL  2.  Patient will increase Berg Balance score by > 4 points to demonstrate decreased fall risk during functional activities. Baseline: 40/56 Goal status: INITIAL   3.  Patient will reduce timed up and go to <18 seconds to reduce fall risk and demonstrate improved transfer/gait ability. Baseline: 24.6'' with SPC Goal status: INITIAL  4.  Patient will demonstrate independence with current HEP showing proper mechanics/positioning with exercises allowing her to perform HEP at home with improved safety.   BASELINE: HEP initiated at evaluation with handout/education.  Goal status: INITIAL   LONG TERM GOALS: Target date: 11/24/2024  1.  Patient (> 68 years old) will complete five times sit to stand test in < 15 seconds indicating an increased LE strength and improved balance. Baseline: 69'' with UE use Goal status: INITIAL  2.  Patient will increase Berg Balance score by > 6 points to demonstrate decreased fall risk during functional activities. Baseline: 40/56 Goal status: INITIAL   3.  Patient will reduce timed up and go to <13 seconds to reduce fall risk and demonstrate improved transfer/gait ability. Baseline: 24.6 with SPC Goal status: INITIAL  4.  Patient will demonstrate tandem stance for 15'' without UE for balance recovery by discharge allowing her decreased risk for falls. 0 Baseline: Patient able to perform tandem stance for 2'' prior to LOB and need for UE support. Goal status: INITIAL  5.  Patient will increase six minute walk test distance to >1000 for progression to community ambulator and improve gait ability Baseline: 423ft but limited to 2:24 min due to pain in the hip.  Goal status: INITIAL    ASSESSMENT:  CLINICAL IMPRESSION  Pt arrived to session with SPC, but did not utilize it throughout the whole session. PT treatment focused on functional strengthening to improve power production and gluteal  activation. Pt responded well to all interventions. No reported pain on this day other than muscular soreness. Pt would continue to benefit from dynamic balance training and overall cardiovascular endurance. Pt will continue to benefit from skilled therapy to address remaining deficits in order to improve overall QoL and return to PLOF.     OBJECTIVE IMPAIRMENTS: Abnormal gait, decreased activity tolerance, decreased balance, decreased coordination, decreased endurance, decreased mobility, difficulty walking, decreased strength, and pain.   ACTIVITY LIMITATIONS: walking, standing, squatting, stairs, and transfers   PARTICIPATION LIMITATIONS: meal prep, cleaning, laundry, driving, and community activity  PERSONAL FACTORS: Age, Past/current experiences, and 1-2 comorbidities: CAD, HTN, PAD are also affecting patient's functional outcome.   REHAB POTENTIAL: Good  CLINICAL DECISION MAKING: Stable/uncomplicated  EVALUATION COMPLEXITY: Moderate  PLAN:  PT FREQUENCY: 1-2x/week  PT DURATION: 12 weeks  PLANNED INTERVENTIONS: 97164- PT Re-evaluation, 97750- Physical Performance Testing, 97110-Therapeutic exercises, 97530- Therapeutic activity, 97112- Neuromuscular re-education, 97535- Self Care, [id number]- Manual therapy, [phone]- Gait training, Patient/Family education, Balance training, Vestibular training, DME instructions, Cryotherapy, and Moist heat  PLAN FOR NEXT SESSION:    Continue working to improve LE strength/dynamic balance and independence with gait with progressive exercises to patient's tolerance level.    Massie Dollar PT, DPT  Physical Therapist - [facility]  5:05 PM 09/28/2024

## 2024-09-29 ENCOUNTER — Ambulatory Visit: Admitting: Physical Therapy

## 2024-10-17 ENCOUNTER — Ambulatory Visit (INDEPENDENT_AMBULATORY_CARE_PROVIDER_SITE_OTHER): Admitting: Family

## 2024-10-17 ENCOUNTER — Encounter: Payer: Self-pay | Admitting: Family

## 2024-10-17 VITALS — BP 127/63 | HR 98 | Temp 98.3°F | Ht 63.0 in | Wt 142.6 lb

## 2024-10-17 DIAGNOSIS — I10 Essential (primary) hypertension: Secondary | ICD-10-CM

## 2024-10-17 DIAGNOSIS — R251 Tremor, unspecified: Secondary | ICD-10-CM | POA: Diagnosis not present

## 2024-10-17 DIAGNOSIS — R7303 Prediabetes: Secondary | ICD-10-CM | POA: Diagnosis not present

## 2024-10-17 DIAGNOSIS — Z1231 Encounter for screening mammogram for malignant neoplasm of breast: Secondary | ICD-10-CM

## 2024-10-17 DIAGNOSIS — Z780 Asymptomatic menopausal state: Secondary | ICD-10-CM | POA: Diagnosis not present

## 2024-10-17 DIAGNOSIS — E538 Deficiency of other specified B group vitamins: Secondary | ICD-10-CM

## 2024-10-17 DIAGNOSIS — F411 Generalized anxiety disorder: Secondary | ICD-10-CM

## 2024-10-17 DIAGNOSIS — I6523 Occlusion and stenosis of bilateral carotid arteries: Secondary | ICD-10-CM

## 2024-10-17 DIAGNOSIS — I2510 Atherosclerotic heart disease of native coronary artery without angina pectoris: Secondary | ICD-10-CM | POA: Diagnosis not present

## 2024-10-17 NOTE — Progress Notes (Signed)
 "  Assessment & Plan:  Tremor Assessment & Plan: Bilateral, at rest and with action.  Discussed caffeine, nicotine as aggravating features .  She has upcoming neurology appointment.  Will follow.   Primary hypertension Assessment & Plan: Blood pressure at home with excellent control.  Continue amlodipine  2.5 mg daily.  Discussed taking amlodipine  5 mg total daily if blood pressure greater than 130/80.  Will continue to monitor   Carotid atherosclerosis, bilateral  B12 deficiency -     B12 and Folate Panel; Future  Asymptomatic postmenopausal state -     DG Bone Density; Future  Encounter for screening mammogram for malignant neoplasm of breast -     3D Screening Mammogram, Left and Right; Future  GAD (generalized anxiety disorder) Assessment & Plan: Chronic, stable.  Discussed at length and patient politely declines making changes to medication at this time.  She does not feel particularly anxious and she does not feel anxiety is aggravating tremor at this time.  Continue Prozac  40 mg for now.  Discontinue Atarax  as she is not using   Coronary artery calcification seen on CT scan Assessment & Plan: Chronic, symptomatically stable.  She is following with cardiology.  Reiterated the importance of continued follow-up with cardiology and staying on atorvastatin  40 mg daily, Zetia  10 mg daily to reduce ASCVD risk.  Orders: -     Comprehensive metabolic panel with GFR; Future  Prediabetes -     Hemoglobin A1c; Future     Return precautions given.   Risks, benefits, and alternatives of the medications and treatment plan prescribed today were discussed, and patient expressed understanding.   Education regarding symptom management and diagnosis given to patient on AVS either electronically or printed.  Return in about 2 months (around 12/15/2024).  Rollene Northern, FNP  Subjective:    [name], female    DOB: 09-26-1951, 74 y.o.   MRN: [id number]  CC: Wanda Clayton is a 74 y.o. female who presents today for follow up.   HPI: HPI Discussed the use of AI scribe software for clinical note transcription with the patient, who gave verbal consent to proceed.  History of Present Illness   Wanda Clayton is a 74 year old female with hypertension and tremor who presents for follow-up.   She monitors her blood pressure at home, with recent readings of 127/63 mmHg and 136/66 mmHg. She has a history of subclavian steal syndrome. She is currently taking amlodipine  2.5 mg daily and has a supply of 5 mg for use if her blood pressure rises significantly.  Walking limited due to imbalance. Denies chest pain or significant breathing difficulties during physical therapy, which she attends regularly and finds beneficial.  She has a history of smoking and continues to smoke, although she has reduced her intake. She is compliant with atorvastatin  40mg  every day, zetia  10mg  qd  She continue to have bilateral hand tremor, primarily in the mornings, which affects her ability to drink coffee.   She is currently on Prozac  40 mg for anxiety. She doesn't feel anxious and denies panic attacks.  Denies symptoms of depression.      Known left adrenal adenoma.   Previously seen by endocrinology 05/20/2023   CT lung cancer screen 09/2024 1. Lung-RADS 2, benign appearance or behavior. Continue annual screening with low-dose chest CT without contrast in 12 months. 2. Three-vessel coronary atherosclerosis. 3. Stable left adrenal adenoma, for which no follow-up imaging is recommended. 4. Aortic Atherosclerosis (ICD10-I70.0)  and Emphysema (ICD10-J43.9).  Follow-up cardiology 09/23/2024 coronary artery calcification, PAD, hypertension. Discussed increase amlodipine  to 5 mg daily.  If increased lightheadedness go back to 2.5 mg daily.  Follow-up vascular 09/15/2024, confirmed subclavian steal syndrome.  Scheduled with neurology 11/24/2024   Allergies: Cat dander and Pollen  extract Medications Ordered Prior to Encounter[1]  Review of Systems  Constitutional:  Negative for chills and fever.  Respiratory:  Negative for cough.   Cardiovascular:  Negative for chest pain and palpitations.  Gastrointestinal:  Negative for nausea and vomiting.  Neurological:  Positive for tremors.      Objective:    BP 127/63 Comment: at home , right arm  Pulse 98   Temp 98.3 F (36.8 C)   Ht 5' 3 (1.6 m)   Wt 142 lb 9.6 oz (64.7 kg)   SpO2 99%   BMI 25.26 kg/m  BP Readings from Last 3 Encounters:  10/17/24 127/63  09/23/24 (!) 142/70  09/15/24 (!) 182/76   Wt Readings from Last 3 Encounters:  10/17/24 142 lb 9.6 oz (64.7 kg)  09/23/24 142 lb 6.4 oz (64.6 kg)  09/15/24 142 lb (64.4 kg)      08/16/2024   12:31 PM 04/06/2024   10:09 AM 03/25/2024    8:28 AM  Depression screen PHQ 2/9  Decreased Interest 0 0 0  Down, Depressed, Hopeless 0 0 0  PHQ - 2 Score 0 0 0  Altered sleeping  0 0  Tired, decreased energy  0 0  Change in appetite  0 0  Feeling bad or failure about yourself   0 0  Trouble concentrating  0 0  Moving slowly or fidgety/restless  0 0  Suicidal thoughts  0 0  PHQ-9 Score  0  0   Difficult doing work/chores  Not difficult at all Not difficult at all     Data saved with a previous flowsheet row definition      08/16/2024   12:32 PM 04/06/2024   10:09 AM 05/27/2023    9:58 AM 04/15/2023    9:59 AM  GAD 7 : Generalized Anxiety Score  Nervous, Anxious, on Edge 0 0 0 0  Control/stop worrying 0 0 0 0  Worry too much - different things 0 0 0 0  Trouble relaxing 0 0 0 0  Restless 0 0 0 0  Easily annoyed or irritable 0 0 0 0  Afraid - awful might happen 0 0 0 0  Total GAD 7 Score 0 0 0 0  Anxiety Difficulty Not difficult at all Not difficult at all Not difficult at all Not difficult at all     Physical Exam Vitals reviewed.  Constitutional:      Appearance: She is well-developed.  Eyes:     Conjunctiva/sclera: Conjunctivae normal.   Cardiovascular:     Rate and Rhythm: Normal rate and regular rhythm.     Pulses: Normal pulses.     Heart sounds: Normal heart sounds.  Pulmonary:     Effort: Pulmonary effort is normal.     Breath sounds: Normal breath sounds. No wheezing, rhonchi or rales.  Skin:    General: Skin is warm and dry.  Neurological:     Mental Status: She is alert.     Comments: Fine tremor at rest with hands on lap and and also with hands extended.  Tremor noted bilaterally with finger-to-nose  Psychiatric:        Speech: Speech normal.  Behavior: Behavior normal.        Thought Content: Thought content normal.           [1]  Current Outpatient Medications on File Prior to Visit  Medication Sig Dispense Refill   acetaminophen  (TYLENOL ) 325 MG tablet Take 1-2 tablets (325-650 mg total) by mouth every 4 (four) hours as needed for mild pain (pain score 1-3) (or temp >/= 101 F).     amLODipine  (NORVASC ) 2.5 MG tablet Take 1 tablet (2.5 mg total) by mouth daily. 90 tablet 3   amLODipine  (NORVASC ) 5 MG tablet Take 1 tablet (5 mg total) by mouth daily as needed. 90 tablet 3   aspirin  81 MG tablet Take 81 mg by mouth daily.     atorvastatin  (LIPITOR) 40 MG tablet Take 1 tablet (40 mg total) by mouth daily. 100 tablet 3   CALCIUM  PO Take 500 mg by mouth daily.     Cholecalciferol (VITAMIN D3 PO) Take 25 mcg by mouth daily.     clopidogrel  (PLAVIX ) 75 MG tablet Take 1 tablet (75 mg total) by mouth daily at 6 (six) AM. 90 tablet 3   Cyanocobalamin  (B-12 PO) Take 500 mcg by mouth daily.     ezetimibe  (ZETIA ) 10 MG tablet Take 1 tablet (10 mg total) by mouth daily. 90 tablet 3   FLUoxetine  (PROZAC ) 40 MG capsule Take 1 capsule (40 mg total) by mouth every morning. 90 capsule 3   Multiple Vitamins-Minerals (EQ MULTIVITAMINS ADULT GUMMY PO) Take 2 tablets by mouth daily.     meclizine  (ANTIVERT ) 12.5 MG tablet Take 1 tablet (12.5 mg total) by mouth 3 (three) times daily as needed (vertigo). (Patient not  taking: Reported on 10/17/2024) 30 tablet 0   No current facility-administered medications on file prior to visit.   "

## 2024-10-17 NOTE — Assessment & Plan Note (Addendum)
 Bilateral, at rest and with action.  Discussed caffeine, nicotine as aggravating features .  She has upcoming neurology appointment.  Will follow.

## 2024-10-17 NOTE — Assessment & Plan Note (Signed)
 Chronic, stable.  Discussed at length and patient politely declines making changes to medication at this time.  She does not feel particularly anxious and she does not feel anxiety is aggravating tremor at this time.  Continue Prozac  40 mg for now.  Discontinue Atarax  as she is not using

## 2024-10-17 NOTE — Assessment & Plan Note (Signed)
 Blood pressure at home with excellent control.  Continue amlodipine  2.5 mg daily.  Discussed taking amlodipine  5 mg total daily if blood pressure greater than 130/80.  Will continue to monitor

## 2024-10-17 NOTE — Assessment & Plan Note (Signed)
 Chronic, symptomatically stable.  She is following with cardiology.  Reiterated the importance of continued follow-up with cardiology and staying on atorvastatin  40 mg daily, Zetia  10 mg daily to reduce ASCVD risk.

## 2024-10-17 NOTE — Patient Instructions (Addendum)
 We will await evaluation with neurology recommendations of tremor.   It is imperative that you are seen AT least twice per year for labs and monitoring. Monitor blood pressure at home and me 5-6 reading on separate days. Goal is less than 120/80, based on newest guidelines, however we certainly want to be less than 130/80;  if persistently higher, please make sooner follow up appointment so we can recheck you blood pressure and manage/ adjust medications.   Please call  and schedule your 3D mammogram and /or bone density scan as we discussed.   [facility]  ( new location in 2023)  [address], [place], [place] [address]  [place], [place]  [phone]

## 2024-10-19 ENCOUNTER — Ambulatory Visit

## 2024-10-19 DIAGNOSIS — R296 Repeated falls: Secondary | ICD-10-CM | POA: Insufficient documentation

## 2024-10-19 NOTE — Therapy (Signed)
 [facility]AT[facility]   Patient Name: Wanda Clayton MRN: [id number] DOB:1951-02-25, 74 y.o., female Today's Date: 10/19/2024   PCP: Dineen Rollene MATSU, FNP  REFERRING PROVIDER: Abbey Bruckner, MD  END OF SESSION:  PT End of Session - 10/19/24 1103     Visit Number 9    Number of Visits 25    Date for Recertification  11/24/24    Progress Note Due on Visit 10    PT Start Time 1100    PT Stop Time 1140    PT Time Calculation (min) 40 min    Equipment Utilized During Treatment Gait belt    Activity Tolerance Patient tolerated treatment well;Patient limited by fatigue    Behavior During Therapy [facility] for tasks assessed/performed           Past Medical History:  Diagnosis Date   Acute pain of left foot 02/08/2020   Anxiety    Coronary artery disease    Depression    Dyspnea    Elevated liver enzymes 11/28/2020   High cholesterol    Hypertension    Peripheral vascular disease    Smoker 02/21/2018   Past Surgical History:  Procedure Laterality Date   BUNIONECTOMY Bilateral    carpal tunnel repair     COLONOSCOPY WITH PROPOFOL  N/A 08/19/2021   Procedure: COLONOSCOPY WITH PROPOFOL ;  Surgeon: Unk Corinn Skiff, MD;  Location: [facility] ENDOSCOPY;  Service: Gastroenterology;  Laterality: N/A;   DENTAL SURGERY     ENDARTERECTOMY FEMORAL Bilateral 09/16/2023   Procedure: ENDARTERECTOMY FEMORAL (BILATERAL SFA STENTS);  Surgeon: Jama Cordella MATSU, MD;  Location: [facility] ORS;  Service: Vascular;  Laterality: Bilateral;   FOOT SURGERY Right    LOWER EXTREMITY ANGIOGRAPHY Right 08/18/2023   Procedure: Lower Extremity Angiography;  Surgeon: Jama Cordella MATSU, MD;  Location: [facility] INVASIVE CV LAB;  Service: Cardiovascular;  Laterality: Right;   TUBAL LIGATION     VAGINAL DELIVERY     x1   Patient Active Problem List   Diagnosis Date Noted   Dizziness 08/16/2024   Recurrent falls 08/08/2024   Laceration of skin of elbow, right, sequela 08/08/2024   Displaced fracture  of distal phalanx of left thumb, initial encounter for closed fracture 08/05/2024   Abrasion of right upper arm 08/05/2024   Tachycardia 02/24/2024   Rash 11/23/2023   Lung nodule 10/22/2023   Wound dehiscence 10/22/2023   Atherosclerosis of artery of extremity with rest pain (HCC) 09/16/2023   B12 deficiency 09/01/2023   Atherosclerosis of native arteries of extremity with rest pain (HCC) 09/01/2023   Elevated MCV 05/27/2023   Chronic cerebral ischemia 05/10/2023   Discoloration of skin of lower leg 04/15/2023   Fatigue 04/01/2023   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   Hepatic steatosis 07/28/2022   Blood in stool    Polyp of ascending colon    Elevated liver enzymes 11/28/2020   Closed fracture of fifth metatarsal bone of left foot 02/08/2020   Carotid atherosclerosis, bilateral 01/11/2020   Trouble swallowing 11/30/2019   Family history of colon cancer 09/01/2018   Coronary artery calcification seen on CT scan 02/22/2018   Claudication in peripheral vascular disease 02/22/2018   Smoker 02/21/2018   Vertigo 01/06/2018   Atherosclerosis of aorta 11/26/2017   Hardening of the aorta (main artery of the heart) 11/26/2017   Osteopenia determined by x-ray 09/16/2017   Colitis, collagenous 05/22/2016   Chronic diarrhea 12/18/2015   Tremor 12/18/2015   Noninfective gastroenteritis and colitis 12/18/2015   Hearing loss  06/20/2015   Hyperlipidemia 06/20/2015   Routine general medical examination at a health care facility 06/01/2014   Ganglion cyst of wrist 02/02/2013   HTN (hypertension) 01/19/2012   Elevated blood-pressure reading without diagnosis of hypertension 01/19/2012   Atrophic vaginitis 12/11/2011   GAD (generalized anxiety disorder) 12/11/2011   Dysthymic disorder 12/11/2011    ONSET DATE: 08/05/2024  REFERRING DIAG: R29.6 (ICD-10-CM) - Recurrent falls   THERAPY DIAG:  Repeated falls  Rationale for Evaluation and Treatment: Rehabilitation  SUBJECTIVE:  Reports  that she is doing okay today, but is a little shakey. She arrived using [facility] today. She states she has her patches on her hips and they are not bothering her as much today.     SUBJECTIVE STATEMENT: Pt accompanied by: self  PERTINENT HISTORY: Recent falls   PAIN:  09/12/2024: 0/10 pain in the R hip but wearing patches and took a tylenol  earlier (7-8AM) Are you having pain? Yes: NPRS scale: 0 Pain location: L hip Pain description: Sharp   Aggravating factors: Standing up Relieving factors: Positional changes/ Tylenol    PRECAUTIONS: Fall  RED FLAGS: None   WEIGHT BEARING RESTRICTIONS: No  FALLS: Has patient fallen in last 6 months? Yes. Number of falls 1  LIVING ENVIRONMENT: Lives with: lives with their spouse Lives in: House/apartment Stairs: Yes: External: 3 steps; on right going up and on left going up on back porch. Level entry from front of home. Has following equipment at home: Single point cane and Walker - 2 wheeled  PLOF: Independent with ADLs/household chores   PATIENT GOALS: Patient reports her goals include improved balance with walking and to be able to walk dogs with decreased risk of falls. She also wishes to walk without AD eventually.  OBJECTIVE:  Note: Objective measures were completed at Evaluation unless otherwise noted.  DIAGNOSTIC FINDINGS: None BP: 140/96 HR: 96 COGNITION: Overall cognitive status: Within functional limits for tasks assessed   SENSATION: Patient reports her toes are numb.   COORDINATION: Mild coordination deficits noted throughout treatment with gait/transfers.  EDEMA:  Not tested  MUSCLE TONE: Not tested  MUSCLE LENGTH: Not tested   DTRs:  Not tested  POSTURE: rounded shoulders and forward head  LOWER EXTREMITY ROM:    WFL bilaterally.   LOWER EXTREMITY MMT:    MMT Right Eval Left Eval  Hip flexion 4 4  Hip extension    Hip abduction 4 4-  Hip adduction    Hip internal rotation 4+ 4+  Hip external  rotation 4 4  Knee flexion 4 4  Knee extension 5 5  Ankle dorsiflexion 5 5  Ankle plantarflexion    Ankle inversion    Ankle eversion    (Blank rows = not tested)  BED MOBILITY:  Findings: Independent  TRANSFERS: Independent, uses upper extremities with sit to stand and stand pivot transfers.  RAMP:  Not tested  CURB:  Not tested  STAIRS: Not tested, reports step-to pattern with 2 feet on each step while using bilateral railing. GAIT: Findings: Gait Characteristics: decreased stance time- Left, decreased stride length, and antalgic, Distance walked: 50', Assistive device utilized:Single point cane, and Level of assistance: SBA  BALANCE TESTS:  Romberg: WFL Romberg w/ EC: slight increase in sway.  Tandem Stance: Able to maintain balance for 2'' prior to using UE support to regain balance.  FUNCTIONAL TESTS:  5 times sit to stand: 23.2'' (modified with UE use). Timed up and go (TUG): 24.69 with SPC 6 minute walk test: Test at  next treatment session. 10 meter walk test: 16''; 0.63 m/s with SPC. BERG: Test at next treatment session.  PATIENT SURVEYS:  LEFS  Extreme difficulty/unable (0), Quite a bit of difficulty (1), Moderate difficulty (2), Little difficulty (3), No difficulty (4) Survey date:    Any of your usual work, housework or school activities 1  2. Usual hobbies, recreational or sporting activities 0  3. Getting into/out of the bath 0  4. Walking between rooms 3  5. Putting on socks/shoes 3  6. Squatting  1  7. Lifting an object, like a bag of groceries from the floor 2  8. Performing light activities around your home 2  9. Performing heavy activities around your home 1  10. Getting into/out of a car 3  11. Walking 2 blocks 0  12. Walking 1 mile 0  13. Going up/down 10 stairs (1 flight) 0  14. Standing for 1 hour 0  15.  sitting for 1 hour 0  16. Running on even ground 0  17. Running on uneven ground 0  18. Making sharp turns while running fast 0  19.  Hopping  0  20. Rolling over in bed 3  Score total:  20/80                                                                                                                                 EVALUATION DATE: 09/01/2024   TODAY'S TREATMENT: 10/19/2024   -Nustep, rolling hill program, level 3-6, seat 10, arms 11, 6 minutes. Cues for consistent SPM through varied resistance.   - sit stand without UE support from chair without arm rest x8.   -Sit to stand without UE support from chair with airex pad in it x10 with improve glute activation and forward trunk lean.   - matrix resisted gait forward 12.5#, x 4   - matrix resisted gait backward 12.5#, x2  - forward step ups onto airex in // bars x15 each.   -Sit<>stand without UE support from chair with pad x 10.  -lateral step up/onto/over airex in // bars x15 each direction.   -Sit<>stand without UE support from chair with pad x 10.    PATIENT EDUCATION: Education details: Patient educated on proper exercise mechanics and techniques to improve LE strength. She was also educated on proper techniques for STS transfers for improved safety/effeciency. Person educated: Patient Education method: Explanation, Demonstration, Verbal cues, and Handouts Education comprehension: verbalized understanding and returned demonstration  HOME EXERCISE PROGRAM: Access Code: [email or web address] URL: [email or web address] Date: 09/01/2024 Prepared by: Sidra Simpers  Exercises - Seated Hip Abduction with Resistance  - 1 x daily - 7 x weekly - 3 sets - 10 reps - Sit to Stand with Arms Crossed  - 1 x daily - 7 x weekly - 3 sets - 10 reps - Seated March  - 1 x daily - 7 x weekly - 3 sets - 10 reps  GOALS: Goals  reviewed with patient? Yes  SHORT TERM GOALS: Target date: 10/13/2024   1.  Patient (> 82 years old) will complete five times sit to stand test in < 18 seconds indicating an increased LE strength and improved balance. Baseline: 57'' with UE  use Goal status: INITIAL  2.  Patient will increase Berg Balance score by > 4 points to demonstrate decreased fall risk during functional activities. Baseline: 40/56 Goal status: INITIAL   3.  Patient will reduce timed up and go to <18 seconds to reduce fall risk and demonstrate improved transfer/gait ability. Baseline: 24.6'' with SPC Goal status: INITIAL  4.  Patient will demonstrate independence with current HEP showing proper mechanics/positioning with exercises allowing her to perform HEP at home with improved safety.   BASELINE: HEP initiated at evaluation with handout/education.  Goal status: INITIAL   LONG TERM GOALS: Target date: 11/24/2024  1.  Patient (> 37 years old) will complete five times sit to stand test in < 15 seconds indicating an increased LE strength and improved balance. Baseline: 49'' with UE use Goal status: INITIAL  2.  Patient will increase Berg Balance score by > 6 points to demonstrate decreased fall risk during functional activities. Baseline: 40/56 Goal status: INITIAL   3.  Patient will reduce timed up and go to <13 seconds to reduce fall risk and demonstrate improved transfer/gait ability. Baseline: 24.6 with SPC Goal status: INITIAL  4.  Patient will demonstrate tandem stance for 15'' without UE for balance recovery by discharge allowing her decreased risk for falls. 0 Baseline: Patient able to perform tandem stance for 2'' prior to LOB and need for UE support. Goal status: INITIAL  5.  Patient will increase six minute walk test distance to >1000 for progression to community ambulator and improve gait ability Baseline: 466ft but limited to 2:24 min due to pain in the hip.  Goal status: INITIAL    ASSESSMENT:  CLINICAL IMPRESSION  Pt arrived to session with SPC, but did not utilize it throughout the whole session. Pt continued working to improve functional strength along with dynamic balance. She continues to primarily use quads to stand  requiring cues for optimal positioning with sit to stands which did improve with practice. She continues to demonstrate decreased dynamic balance and tends to rush resulting in worse balance when performing balance activities requiring verb cues to slow down and regain balance fully prior to starting again. Patient was told to schedule a 10th visit for progress note at next session for goal re-assessment.    Pt would continue to benefit from dynamic balance training and overall cardiovascular endurance. Pt will continue to benefit from skilled therapy to address remaining deficits in order to improve overall QoL and return to PLOF.     OBJECTIVE IMPAIRMENTS: Abnormal gait, decreased activity tolerance, decreased balance, decreased coordination, decreased endurance, decreased mobility, difficulty walking, decreased strength, and pain.   ACTIVITY LIMITATIONS: walking, standing, squatting, stairs, and transfers   PARTICIPATION LIMITATIONS: meal prep, cleaning, laundry, driving, and community activity  PERSONAL FACTORS: Age, Past/current experiences, and 1-2 comorbidities: CAD, HTN, PAD are also affecting patient's functional outcome.   REHAB POTENTIAL: Good  CLINICAL DECISION MAKING: Stable/uncomplicated  EVALUATION COMPLEXITY: Moderate  PLAN:  PT FREQUENCY: 1-2x/week  PT DURATION: 12 weeks  PLANNED INTERVENTIONS: 97164- PT Re-evaluation, 97750- Physical Performance Testing, 97110-Therapeutic exercises, 97530- Therapeutic activity, [id number]- Neuromuscular re-education, 97535- Self Care, [id number]- Manual therapy, [phone]- Gait training, Patient/Family education, Balance training, Vestibular training, DME instructions, Cryotherapy, and Moist heat  PLAN FOR NEXT SESSION:  -Perform Progress Note  Continue working to improve LE strength/dynamic balance and independence with gait with progressive exercises to patient's tolerance level.    Norman Sharps, PT, DPT Physical Therapist - [facility]  11:45 AM 10/19/2024

## 2024-11-02 ENCOUNTER — Inpatient Hospital Stay: Admit: 2024-11-02 | Discharge: 2024-11-02 | Disposition: A | Attending: Critical Care Medicine

## 2024-11-02 ENCOUNTER — Emergency Department

## 2024-11-02 ENCOUNTER — Other Ambulatory Visit: Payer: Self-pay

## 2024-11-02 ENCOUNTER — Encounter: Payer: Self-pay | Admitting: Emergency Medicine

## 2024-11-02 ENCOUNTER — Inpatient Hospital Stay
Admission: EM | Admit: 2024-11-02 | Discharge: 2024-11-13 | DRG: 871 | Disposition: E | Attending: Pulmonary Disease | Admitting: Pulmonary Disease

## 2024-11-02 DIAGNOSIS — Z803 Family history of malignant neoplasm of breast: Secondary | ICD-10-CM

## 2024-11-02 DIAGNOSIS — D649 Anemia, unspecified: Secondary | ICD-10-CM | POA: Diagnosis present

## 2024-11-02 DIAGNOSIS — R739 Hyperglycemia, unspecified: Secondary | ICD-10-CM | POA: Diagnosis present

## 2024-11-02 DIAGNOSIS — J188 Other pneumonia, unspecified organism: Secondary | ICD-10-CM | POA: Diagnosis not present

## 2024-11-02 DIAGNOSIS — J189 Pneumonia, unspecified organism: Secondary | ICD-10-CM | POA: Diagnosis present

## 2024-11-02 DIAGNOSIS — J439 Emphysema, unspecified: Secondary | ICD-10-CM | POA: Diagnosis present

## 2024-11-02 DIAGNOSIS — R6521 Severe sepsis with septic shock: Secondary | ICD-10-CM | POA: Diagnosis present

## 2024-11-02 DIAGNOSIS — R4182 Altered mental status, unspecified: Secondary | ICD-10-CM | POA: Diagnosis not present

## 2024-11-02 DIAGNOSIS — S2241XA Multiple fractures of ribs, right side, initial encounter for closed fracture: Secondary | ICD-10-CM | POA: Diagnosis present

## 2024-11-02 DIAGNOSIS — Z91048 Other nonmedicinal substance allergy status: Secondary | ICD-10-CM

## 2024-11-02 DIAGNOSIS — Z1152 Encounter for screening for COVID-19: Secondary | ICD-10-CM | POA: Diagnosis not present

## 2024-11-02 DIAGNOSIS — Z7982 Long term (current) use of aspirin: Secondary | ICD-10-CM

## 2024-11-02 DIAGNOSIS — Z820 Family history of epilepsy and other diseases of the nervous system: Secondary | ICD-10-CM

## 2024-11-02 DIAGNOSIS — I10 Essential (primary) hypertension: Secondary | ICD-10-CM | POA: Diagnosis present

## 2024-11-02 DIAGNOSIS — J90 Pleural effusion, not elsewhere classified: Secondary | ICD-10-CM | POA: Diagnosis present

## 2024-11-02 DIAGNOSIS — G9340 Encephalopathy, unspecified: Secondary | ICD-10-CM

## 2024-11-02 DIAGNOSIS — Z716 Tobacco abuse counseling: Secondary | ICD-10-CM

## 2024-11-02 DIAGNOSIS — A419 Sepsis, unspecified organism: Principal | ICD-10-CM | POA: Diagnosis present

## 2024-11-02 DIAGNOSIS — F17210 Nicotine dependence, cigarettes, uncomplicated: Secondary | ICD-10-CM | POA: Diagnosis present

## 2024-11-02 DIAGNOSIS — Z515 Encounter for palliative care: Secondary | ICD-10-CM | POA: Diagnosis not present

## 2024-11-02 DIAGNOSIS — Z833 Family history of diabetes mellitus: Secondary | ICD-10-CM

## 2024-11-02 DIAGNOSIS — F419 Anxiety disorder, unspecified: Secondary | ICD-10-CM | POA: Diagnosis present

## 2024-11-02 DIAGNOSIS — Z66 Do not resuscitate: Secondary | ICD-10-CM | POA: Diagnosis present

## 2024-11-02 DIAGNOSIS — E7800 Pure hypercholesterolemia, unspecified: Secondary | ICD-10-CM | POA: Diagnosis present

## 2024-11-02 DIAGNOSIS — F1020 Alcohol dependence, uncomplicated: Secondary | ICD-10-CM | POA: Diagnosis present

## 2024-11-02 DIAGNOSIS — J9601 Acute respiratory failure with hypoxia: Principal | ICD-10-CM | POA: Diagnosis present

## 2024-11-02 DIAGNOSIS — W19XXXA Unspecified fall, initial encounter: Secondary | ICD-10-CM | POA: Diagnosis present

## 2024-11-02 DIAGNOSIS — Z7902 Long term (current) use of antithrombotics/antiplatelets: Secondary | ICD-10-CM | POA: Diagnosis not present

## 2024-11-02 DIAGNOSIS — E872 Acidosis, unspecified: Secondary | ICD-10-CM | POA: Diagnosis present

## 2024-11-02 DIAGNOSIS — I2510 Atherosclerotic heart disease of native coronary artery without angina pectoris: Secondary | ICD-10-CM | POA: Diagnosis present

## 2024-11-02 DIAGNOSIS — R296 Repeated falls: Secondary | ICD-10-CM | POA: Diagnosis present

## 2024-11-02 DIAGNOSIS — Z8379 Family history of other diseases of the digestive system: Secondary | ICD-10-CM

## 2024-11-02 DIAGNOSIS — M47812 Spondylosis without myelopathy or radiculopathy, cervical region: Secondary | ICD-10-CM | POA: Diagnosis present

## 2024-11-02 DIAGNOSIS — Z79899 Other long term (current) drug therapy: Secondary | ICD-10-CM

## 2024-11-02 DIAGNOSIS — I739 Peripheral vascular disease, unspecified: Secondary | ICD-10-CM | POA: Diagnosis present

## 2024-11-02 DIAGNOSIS — Z825 Family history of asthma and other chronic lower respiratory diseases: Secondary | ICD-10-CM

## 2024-11-02 DIAGNOSIS — F32A Depression, unspecified: Secondary | ICD-10-CM | POA: Diagnosis present

## 2024-11-02 DIAGNOSIS — K5730 Diverticulosis of large intestine without perforation or abscess without bleeding: Secondary | ICD-10-CM | POA: Diagnosis present

## 2024-11-02 DIAGNOSIS — Z800 Family history of malignant neoplasm of digestive organs: Secondary | ICD-10-CM

## 2024-11-02 DIAGNOSIS — R0603 Acute respiratory distress: Secondary | ICD-10-CM | POA: Diagnosis not present

## 2024-11-02 DIAGNOSIS — I700 Atherosclerosis of aorta: Secondary | ICD-10-CM | POA: Diagnosis present

## 2024-11-02 DIAGNOSIS — Z8249 Family history of ischemic heart disease and other diseases of the circulatory system: Secondary | ICD-10-CM

## 2024-11-02 LAB — LACTIC ACID, PLASMA
Lactic Acid, Venous: >9 mmol/L (ref 0.5–1.9)
Lactic Acid, Venous: 3.5 mmol/L (ref 0.5–1.9)
Lactic Acid, Venous: 1.7 mmol/L (ref 0.5–1.9)

## 2024-11-02 LAB — CBC
HCT: 32.6 % — ABNORMAL LOW (ref 36.0–46.0)
HCT: 28.2 % — ABNORMAL LOW (ref 36.0–46.0)
Hemoglobin: 9.6 g/dL — ABNORMAL LOW (ref 12.0–15.0)
Hemoglobin: 8.9 g/dL — ABNORMAL LOW (ref 12.0–15.0)
MCH: 28.4 pg (ref 26.0–34.0)
MCH: 28.7 pg (ref 26.0–34.0)
MCHC: 29.4 g/dL — ABNORMAL LOW (ref 30.0–36.0)
MCHC: 31.6 g/dL (ref 30.0–36.0)
MCV: 96.4 fL (ref 80.0–100.0)
MCV: 91 fL (ref 80.0–100.0)
Platelets: 472 K/uL — ABNORMAL HIGH (ref 150–400)
Platelets: 435 K/uL — ABNORMAL HIGH (ref 150–400)
RBC: 3.38 MIL/uL — ABNORMAL LOW (ref 3.87–5.11)
RBC: 3.1 MIL/uL — ABNORMAL LOW (ref 3.87–5.11)
RDW: 17 % — ABNORMAL HIGH (ref 11.5–15.5)
RDW: 17 % — ABNORMAL HIGH (ref 11.5–15.5)
WBC: 23.1 K/uL — ABNORMAL HIGH (ref 4.0–10.5)
WBC: 18.4 K/uL — ABNORMAL HIGH (ref 4.0–10.5)
nRBC: 0 % (ref 0.0–0.2)
nRBC: 0 % (ref 0.0–0.2)

## 2024-11-02 LAB — COMPREHENSIVE METABOLIC PANEL WITH GFR
ALT: 16 U/L (ref 0–44)
AST: 35 U/L (ref 15–41)
Albumin: 3.3 g/dL — ABNORMAL LOW (ref 3.5–5.0)
Alkaline Phosphatase: 153 U/L — ABNORMAL HIGH (ref 38–126)
Anion gap: 28 — ABNORMAL HIGH (ref 5–15)
BUN: 16 mg/dL (ref 8–23)
CO2: 11 mmol/L — ABNORMAL LOW (ref 22–32)
Calcium: 9.3 mg/dL (ref 8.9–10.3)
Chloride: 92 mmol/L — ABNORMAL LOW (ref 98–111)
Creatinine, Ser: 0.91 mg/dL (ref 0.44–1.00)
GFR, Estimated: >60 mL/min
Glucose, Bld: 215 mg/dL — ABNORMAL HIGH (ref 70–99)
Potassium: 3.9 mmol/L (ref 3.5–5.1)
Sodium: 131 mmol/L — ABNORMAL LOW (ref 135–145)
Total Bilirubin: 0.6 mg/dL (ref 0.0–1.2)
Total Protein: 7.1 g/dL (ref 6.5–8.1)

## 2024-11-02 LAB — BLOOD GAS, VENOUS
Acid-base deficit: 16.7 mmol/L — ABNORMAL HIGH (ref 0.0–2.0)
Bicarbonate: 11.2 mmol/L — ABNORMAL LOW (ref 20.0–28.0)
O2 Saturation: 55.2 %
Patient temperature: 37
pCO2, Ven: 33 mmHg — ABNORMAL LOW (ref 44–60)
pH, Ven: 7.14 — CL (ref 7.25–7.43)
pO2, Ven: 38 mmHg (ref 32–45)

## 2024-11-02 LAB — ECHOCARDIOGRAM COMPLETE
Area-P 1/2: 4.8 cm2
Height: 63 in
S' Lateral: 2.1 cm
Weight: 2278.67 [oz_av]

## 2024-11-02 LAB — TROPONIN T, HIGH SENSITIVITY
Troponin T High Sensitivity: 16 ng/L (ref 0–19)
Troponin T High Sensitivity: 16 ng/L (ref 0–19)

## 2024-11-02 LAB — CREATININE, SERUM
Creatinine, Ser: 0.74 mg/dL (ref 0.44–1.00)
GFR, Estimated: >60 mL/min

## 2024-11-02 LAB — BLOOD GAS, ARTERIAL
Acid-Base Excess: 0.7 mmol/L (ref 0.0–2.0)
Bicarbonate: 24.3 mmol/L (ref 20.0–28.0)
Delivery systems: POSITIVE
FIO2: 75 %
O2 Saturation: 90.6 %
Patient temperature: 37
pCO2 arterial: 35 mmHg (ref 32–48)
pH, Arterial: 7.45 (ref 7.35–7.45)
pO2, Arterial: 60 mmHg — ABNORMAL LOW (ref 83–108)

## 2024-11-02 LAB — RESP PANEL BY RT-PCR (RSV, FLU A&B, COVID)  RVPGX2
Influenza A by PCR: NEGATIVE
Influenza B by PCR: NEGATIVE
Resp Syncytial Virus by PCR: NEGATIVE
SARS Coronavirus 2 by RT PCR: NEGATIVE

## 2024-11-02 LAB — PROTIME-INR
INR: 1.2 (ref 0.8–1.2)
Prothrombin Time: 15.6 s — ABNORMAL HIGH (ref 11.4–15.2)

## 2024-11-02 LAB — ETHANOL: Alcohol, Ethyl (B): <15 mg/dL

## 2024-11-02 LAB — CBG MONITORING, ED
Glucose-Capillary: 173 mg/dL — ABNORMAL HIGH (ref 70–99)
Glucose-Capillary: 170 mg/dL — ABNORMAL HIGH (ref 70–99)

## 2024-11-02 LAB — PRO BRAIN NATRIURETIC PEPTIDE: Pro Brain Natriuretic Peptide: 3356 pg/mL — ABNORMAL HIGH

## 2024-11-02 LAB — BETA-HYDROXYBUTYRIC ACID: Beta-Hydroxybutyric Acid: 0.08 mmol/L (ref 0.05–0.27)

## 2024-11-02 MED ORDER — CHLORHEXIDINE GLUCONATE CLOTH 2 % EX PADS
6.0000 | MEDICATED_PAD | Freq: Every day | CUTANEOUS | Status: DC
  Filled 2024-11-02: qty 6

## 2024-11-02 MED ORDER — SODIUM CHLORIDE 0.9 % IV SOLN: 500.0000 mg | Freq: Once | INTRAVENOUS | Status: DC

## 2024-11-02 MED ORDER — DEXMEDETOMIDINE HCL IN NACL 400 MCG/100ML IV SOLN
0.0000 ug/kg/h | INTRAVENOUS | Status: DC
  Administered 2024-11-02: 0.2 ug/kg/h via INTRAVENOUS
  Filled 2024-11-02: qty 100

## 2024-11-02 MED ORDER — NOREPINEPHRINE 4 MG/250ML-% IV SOLN
0.0000 ug/min | INTRAVENOUS | Status: DC
  Administered 2024-11-02: 2 ug/min via INTRAVENOUS
  Filled 2024-11-02: qty 250

## 2024-11-02 MED ORDER — SODIUM CHLORIDE 0.9 % IV SOLN
100.0000 mg | Freq: Two times a day (BID) | INTRAVENOUS | Status: DC
  Filled 2024-11-02: qty 100

## 2024-11-02 MED ORDER — TRIMETHOBENZAMIDE HCL 100 MG/ML IM SOLN
200.0000 mg | Freq: Three times a day (TID) | INTRAMUSCULAR | Status: DC | PRN
  Administered 2024-11-02: 200 mg via INTRAMUSCULAR
  Filled 2024-11-02 (×2): qty 2

## 2024-11-02 MED ORDER — VANCOMYCIN HCL IN DEXTROSE 1-5 GM/200ML-% IV SOLN
1000.0000 mg | Freq: Once | INTRAVENOUS | Status: AC
  Administered 2024-11-02: 1000 mg via INTRAVENOUS
  Filled 2024-11-02: qty 200

## 2024-11-02 MED ORDER — INSULIN ASPART 100 UNIT/ML IJ SOLN: 0.0000 [IU] | INTRAMUSCULAR | Status: DC

## 2024-11-02 MED ORDER — GLYCOPYRROLATE 0.2 MG/ML IJ SOLN: 0.2000 mg | INTRAMUSCULAR | Status: DC | PRN

## 2024-11-02 MED ORDER — SODIUM BICARBONATE 8.4 % IV SOLN
INTRAVENOUS | Status: AC
  Filled 2024-11-02: qty 50

## 2024-11-02 MED ORDER — SENNA 8.6 MG PO TABS: 1.0000 | ORAL_TABLET | Freq: Two times a day (BID) | ORAL | Status: DC | PRN

## 2024-11-02 MED ORDER — THIAMINE MONONITRATE 100 MG PO TABS: 100.0000 mg | ORAL_TABLET | Freq: Every day | ORAL | Status: DC

## 2024-11-02 MED ORDER — MORPHINE BOLUS VIA INFUSION
5.0000 mg | INTRAVENOUS | Status: DC | PRN
  Administered 2024-11-02: 5 mg via INTRAVENOUS

## 2024-11-02 MED ORDER — ENOXAPARIN SODIUM 40 MG/0.4ML IJ SOSY: 40.0000 mg | PREFILLED_SYRINGE | INTRAMUSCULAR | Status: DC

## 2024-11-02 MED ORDER — SODIUM CHLORIDE 0.9 % IV SOLN: INTRAVENOUS | Status: DC

## 2024-11-02 MED ORDER — IOHEXOL 300 MG/ML  SOLN
100.0000 mL | Freq: Once | INTRAMUSCULAR | Status: AC | PRN
  Administered 2024-11-02: 100 mL via INTRAVENOUS

## 2024-11-02 MED ORDER — ADULT MULTIVITAMIN W/MINERALS CH: 1.0000 | ORAL_TABLET | Freq: Every day | ORAL | Status: DC

## 2024-11-02 MED ORDER — ONDANSETRON HCL 4 MG/2ML IJ SOLN: 4.0000 mg | Freq: Four times a day (QID) | INTRAMUSCULAR | Status: DC | PRN

## 2024-11-02 MED ORDER — ACETAMINOPHEN 325 MG RE SUPP: 650.0000 mg | Freq: Four times a day (QID) | RECTAL | Status: DC | PRN

## 2024-11-02 MED ORDER — SODIUM CHLORIDE 0.9 % IV SOLN
100.0000 mg | Freq: Once | INTRAVENOUS | Status: AC
  Administered 2024-11-02: 100 mg via INTRAVENOUS
  Filled 2024-11-02: qty 100

## 2024-11-02 MED ORDER — LACTATED RINGERS IV BOLUS
1000.0000 mL | Freq: Once | INTRAVENOUS | Status: AC
  Administered 2024-11-02: 1000 mL via INTRAVENOUS

## 2024-11-02 MED ORDER — SODIUM CHLORIDE 0.9 % IV BOLUS: 1000.0000 mL | Freq: Once | INTRAVENOUS | Status: DC

## 2024-11-02 MED ORDER — MORPHINE 100MG IN NS 100ML (1MG/ML) PREMIX INFUSION
0.0000 mg/h | INTRAVENOUS | Status: DC
  Administered 2024-11-02: 5 mg/h via INTRAVENOUS
  Filled 2024-11-02: qty 100

## 2024-11-02 MED ORDER — SODIUM CHLORIDE 0.9 % IV SOLN
2.0000 g | Freq: Once | INTRAVENOUS | Status: AC
  Administered 2024-11-02: 2 g via INTRAVENOUS
  Filled 2024-11-02: qty 12.5

## 2024-11-02 MED ORDER — METHYLPREDNISOLONE SODIUM SUCC 40 MG IJ SOLR
40.0000 mg | Freq: Two times a day (BID) | INTRAMUSCULAR | Status: DC
  Administered 2024-11-02: 40 mg via INTRAVENOUS
  Filled 2024-11-02: qty 1

## 2024-11-02 MED ORDER — LORAZEPAM 2 MG/ML IJ SOLN
1.0000 mg | Freq: Once | INTRAMUSCULAR | Status: DC
  Filled 2024-11-02: qty 1

## 2024-11-02 MED ORDER — POLYETHYLENE GLYCOL 3350 17 G PO PACK: 17.0000 g | PACK | Freq: Every day | ORAL | Status: DC | PRN

## 2024-11-02 MED ORDER — POLYVINYL ALCOHOL 1.4 % OP SOLN: 1.0000 [drp] | Freq: Four times a day (QID) | OPHTHALMIC | Status: DC | PRN

## 2024-11-02 MED ORDER — STERILE WATER FOR INJECTION IV SOLN
INTRAVENOUS | Status: DC
  Filled 2024-11-02: qty 1000

## 2024-11-02 MED ORDER — LORAZEPAM 2 MG/ML IJ SOLN
0.5000 mg | Freq: Once | INTRAMUSCULAR | Status: AC
  Administered 2024-11-02: 0.5 mg via INTRAVENOUS
  Filled 2024-11-02: qty 1

## 2024-11-02 MED ORDER — SODIUM BICARBONATE 8.4 % IV SOLN
50.0000 meq | INTRAVENOUS | Status: AC
  Administered 2024-11-02: 50 meq via INTRAVENOUS

## 2024-11-02 MED ORDER — LORAZEPAM 2 MG/ML IJ SOLN
2.0000 mg | INTRAMUSCULAR | Status: DC | PRN
  Filled 2024-11-02: qty 2

## 2024-11-02 MED ORDER — SODIUM CHLORIDE 0.9 % IV SOLN
2.0000 g | INTRAVENOUS | Status: DC
  Administered 2024-11-02: 2 g via INTRAVENOUS
  Filled 2024-11-02: qty 20

## 2024-11-02 MED ORDER — SODIUM CHLORIDE 0.9 % IV BOLUS
1000.0000 mL | Freq: Once | INTRAVENOUS | Status: AC
  Administered 2024-11-02: 1000 mL via INTRAVENOUS

## 2024-11-02 MED ORDER — HALOPERIDOL LACTATE 5 MG/ML IJ SOLN: 2.5000 mg | INTRAMUSCULAR | Status: DC | PRN

## 2024-11-02 MED ORDER — FOLIC ACID 1 MG PO TABS: 1.0000 mg | ORAL_TABLET | Freq: Every day | ORAL | Status: DC

## 2024-11-02 MED ORDER — THIAMINE HCL 100 MG/ML IJ SOLN: 100.0000 mg | Freq: Every day | INTRAMUSCULAR | Status: DC

## 2024-11-02 MED ORDER — IPRATROPIUM-ALBUTEROL 0.5-2.5 (3) MG/3ML IN SOLN
3.0000 mL | Freq: Four times a day (QID) | RESPIRATORY_TRACT | Status: DC
  Administered 2024-11-02: 3 mL via RESPIRATORY_TRACT
  Filled 2024-11-02: qty 3

## 2024-11-02 MED ORDER — LACTATED RINGERS IV SOLN: INTRAVENOUS | Status: DC

## 2024-11-02 MED ORDER — IPRATROPIUM-ALBUTEROL 0.5-2.5 (3) MG/3ML IN SOLN: 3.0000 mL | Freq: Four times a day (QID) | RESPIRATORY_TRACT | Status: DC | PRN

## 2024-11-02 MED ORDER — SODIUM CHLORIDE 0.9 % IV SOLN
250.0000 mL | INTRAVENOUS | Status: DC
  Administered 2024-11-02 (×2): 250 mL via INTRAVENOUS

## 2024-11-02 MED ORDER — LIDOCAINE 5 % EX PTCH
1.0000 | MEDICATED_PATCH | CUTANEOUS | Status: DC
  Administered 2024-11-02: 1 via TRANSDERMAL
  Filled 2024-11-02: qty 1

## 2024-11-07 LAB — CULTURE, BLOOD (ROUTINE X 2)
Culture: NO GROWTH
Culture: NO GROWTH

## 2024-11-13 NOTE — ED Notes (Signed)
 NS bolus started per EDP orders for hypotension

## 2024-11-13 NOTE — ED Notes (Signed)
 Turned and repositioned.  Linens changed.  Patient anxious, Precedex  started, see MAR.

## 2024-11-13 NOTE — IPAL (Signed)
" °  Interdisciplinary Goals of Care Family Meeting   Date carried out: Nov 24, 2024  Location of the meeting: Bedside  Member's involved: Physician, Nurse Practitioner, Family Member or next of kin, and Other: ICU Charge Nurse   Durable Power of Attorney or acting medical decision maker: Pts spouse Wanda Clayton along with pts daughter Wanda Clayton and granddaughter Wanda Clayton   Discussion: We discussed goals of care for Wanda Clayton .  Despite aggressive treatment pt continues to decline from a respiratory status on Bipap. She is High Risk for Respiratory Arrest/Cardiac Arrest.  According to pts family she has always expressed quality of life is her main priority.  Discussed goals of care and pts spouse decided to transition pt to COMFORT MEASURES ONLY.    Code status:   Code Status: Do not attempt resuscitation (DNR) - Comfort care   Disposition: In-patient comfort care  Time spent for the meeting: 20 minutes     Lonell Moose, AGNP  Pulmonary/Critical Care Pager [phone] (please enter 7 digits) PCCM Consult Pager [phone] (please enter 7 digits)    "

## 2024-11-13 NOTE — Death Summary Note (Signed)
 " DEATH SUMMARY   Patient Details  Name: Wanda Clayton MRN: [id number] DOB: Dec 15, 1950  Admission/Discharge Information   Admit Date:  2024-11-19  Date of Death: Date of Death: 11-19-2024  Time of Death: Time of Death: 11-30-50  Length of Stay: 0  Referring Physician: Dineen Rollene MATSU, FNP   Reason(s) for Hospitalization  Shortness of Breath  Diagnoses  Preliminary cause of death: Multifocal pneumonia Secondary Diagnoses (including complications and co-morbidities):  Principal Problem:   Pneumonia Active Problems:   Acute respiratory failure with hypoxia (HCC)   Multiple closed fractures of ribs of right side   Septic shock (HCC)   Acute hypoxic respiratory failure (HCC)   Emphysema    Small bilateral pleural effusions    Septic shock     Lactic acidosis    ETOH Abuse    Anion gab metabolic acidosis   Brief Hospital Course (including significant findings, care, treatment, and services provided and events leading to death)  Wanda Clayton is a 74 y.o. year old female who presented to [facility] ER on 11/19/2024 following a fall 6 days prior to presentation.  She reported she did not hit her head during the fall, however her family reports she did.  She does endorse hitting her right chest wall.  Over the past two days the pt has had increasing confusion and shortness of breath prompting EMS notification.  When EMS arrived on the scene pt hypoxic with O2 sats 60 to 70% on RA.     ED Course  Upon arrival to the ER pt noted to have increased work of breathing and appeared tremulous.  Pt placed on Bipap.  However, pts family reports pt has tremors at baseline and is being worked up for [employer].  They also report the pt drinks 2-3 cups of wine daily, pt reports she last drank alcohol  yesterday around 1400.  She also smokes cigarettes daily.  Significant lab results were: Na+ 131/chloride 92/CO2 11/glucose 215/anion gap 28/alk phos 153/albumin  3.3/pro BNP 3,356/lactic acid >9.0/wbc 23.1/hgb  9.6/platelet 472.  VBG: pH 7.14/CO2 33/acid-base deficit 16.7/bicarb 11.2.  X-ray revealed minimally displaced fracture of the lateral right eighth rib and multifocal pneumonia.  CT Chest concerning for multilobar pneumonia and small bilateral pleural effusions.  Pt met sepsis protocol and received: 2L of iv fluid resuscitation/cefepime /vancomycin / doxycycline .  Despite iv fluid resuscitation pt remained hypotensive requiring levophed  gtt.  PCCM team contacted for ICU admission.    Despite aggressive treatment pt continues to decline from a respiratory status on Bipap.  Discussed goals of care and pts spouse decided to transition pt to COMFORT MEASURES ONLY on 11-19-24.  Pt expired on 11/19/24 at 1752.    Pertinent Labs and Studies  Significant Diagnostic Studies ECHOCARDIOGRAM COMPLETE Result Date: 11-19-2024    ECHOCARDIOGRAM REPORT   Patient Name:   Wanda Clayton [facility] Date of Exam: 11/19/24 Medical Rec #:  [id number]      Height:       63.0 in Accession #:    [id number]     Weight:       142.4 lb Date of Birth:  Aug 04, 1951       BSA:          1.674 m Patient Age:    74 years       BP:           121/72 mmHg Patient Gender: F              HR:  105 bpm. Exam Location:  [facility] Procedure: 2D Echo, Cardiac Doppler and Color Doppler (Both Spectral and Color            Flow Doppler were utilized during procedure). Indications:     Acute respiratory distress  History:         Patient has prior history of Echocardiogram examinations, most                  recent 05/04/2023. Arrythmias:Tachycardia; Risk                  Factors:Hypertension and Dyslipidemia.  Sonographer:     Philomena Daring Referring Phys:  [id number] LONELL KANDICE MOOSE Diagnosing Phys: Evalene Lunger MD IMPRESSIONS  1. Left ventricular ejection fraction, by estimation, is 60 to 65%. The left ventricle has normal function. The left ventricle has no regional wall motion abnormalities. Left ventricular diastolic parameters are consistent with Grade I  diastolic dysfunction (impaired relaxation).  2. Right ventricular systolic function is normal. The right ventricular size is normal. There is normal pulmonary artery systolic pressure. The estimated right ventricular systolic pressure is 26.3 mmHg.  3. The mitral valve is normal in structure. No evidence of mitral valve regurgitation. No evidence of mitral stenosis.  4. The aortic valve is normal in structure. Aortic valve regurgitation is not visualized. Aortic valve sclerosis is present, with no evidence of aortic valve stenosis.  5. The inferior vena cava is normal in size with greater than 50% respiratory variability, suggesting right atrial pressure of 3 mmHg. FINDINGS  Left Ventricle: Left ventricular ejection fraction, by estimation, is 60 to 65%. The left ventricle has normal function. The left ventricle has no regional wall motion abnormalities. Strain was performed and the global longitudinal strain is indeterminate. The left ventricular internal cavity size was normal in size. There is no left ventricular hypertrophy. Left ventricular diastolic parameters are consistent with Grade I diastolic dysfunction (impaired relaxation). Right Ventricle: The right ventricular size is normal. No increase in right ventricular wall thickness. Right ventricular systolic function is normal. There is normal pulmonary artery systolic pressure. The tricuspid regurgitant velocity is 2.31 m/s, and  with an assumed right atrial pressure of 5 mmHg, the estimated right ventricular systolic pressure is 26.3 mmHg. Left Atrium: Left atrial size was normal in size. Right Atrium: Right atrial size was normal in size. Pericardium: There is no evidence of pericardial effusion. Mitral Valve: The mitral valve is normal in structure. No evidence of mitral valve regurgitation. No evidence of mitral valve stenosis. Tricuspid Valve: The tricuspid valve is normal in structure. Tricuspid valve regurgitation is mild . No evidence of tricuspid  stenosis. Aortic Valve: The aortic valve is normal in structure. Aortic valve regurgitation is not visualized. Aortic valve sclerosis is present, with no evidence of aortic valve stenosis. Pulmonic Valve: The pulmonic valve was normal in structure. Pulmonic valve regurgitation is not visualized. No evidence of pulmonic stenosis. Aorta: The aortic root is normal in size and structure. Venous: The inferior vena cava is normal in size with greater than 50% respiratory variability, suggesting right atrial pressure of 3 mmHg. IAS/Shunts: No atrial level shunt detected by color flow Doppler. Additional Comments: 3D was performed not requiring image post processing on an independent workstation and was indeterminate.  LEFT VENTRICLE PLAX 2D LVIDd:         3.10 cm   Diastology LVIDs:         2.10 cm   LV e' medial:    10.00  cm/s LV PW:         1.00 cm   LV E/e' medial:  9.5 LV IVS:        1.00 cm   LV e' lateral:   10.70 cm/s LVOT diam:     1.80 cm   LV E/e' lateral: 8.9 LV SV:         47 LV SV Index:   28 LVOT Area:     2.54 cm  RIGHT VENTRICLE             IVC RV S prime:     12.50 cm/s  IVC diam: 2.30 cm TAPSE (M-mode): 1.8 cm LEFT ATRIUM             Index        RIGHT ATRIUM           Index LA diam:        3.20 cm 1.91 cm/m   RA Area:     10.80 cm LA Vol (A2C):   28.2 ml 16.85 ml/m  RA Volume:   19.40 ml  11.59 ml/m LA Vol (A4C):   27.1 ml 16.19 ml/m LA Biplane Vol: 27.8 ml 16.61 ml/m  AORTIC VALVE LVOT Vmax:   113.00 cm/s LVOT Vmean:  73.200 cm/s LVOT VTI:    0.184 m  AORTA Ao Root diam: 3.00 cm MITRAL VALVE                TRICUSPID VALVE MV Area (PHT): 4.80 cm     TR Peak grad:   21.3 mmHg MV Decel Time: 158 msec     TR Vmax:        231.00 cm/s MV E velocity: 95.40 cm/s MV A velocity: 100.00 cm/s  SHUNTS MV E/A ratio:  0.95         Systemic VTI:  0.18 m                             Systemic Diam: 1.80 cm Evalene Lunger MD Electronically signed by Evalene Lunger MD Signature Date/Time: 2024-11-22/4:10:55 PM     Final    CT CHEST ABDOMEN PELVIS W CONTRAST Result Date: 11/22/2024 CLINICAL DATA:  Sepsis. EXAM: CT CHEST, ABDOMEN, AND PELVIS WITH CONTRAST TECHNIQUE: Multidetector CT imaging of the chest, abdomen and pelvis was performed following the standard protocol during bolus administration of intravenous contrast. RADIATION DOSE REDUCTION: This exam was performed according to the departmental dose-optimization program which includes automated exposure control, adjustment of the mA and/or kV according to patient size and/or use of iterative reconstruction technique. CONTRAST:  OMNIPAQUE  IOHEXOL  300 MG/ML  SOLN COMPARISON:  Chest CT dated 09/22/2024. FINDINGS: Evaluation of this exam is limited due to respiratory motion as well as streak artifact caused by patient's arms and overlying support wires. CT CHEST FINDINGS Cardiovascular: There is no cardiomegaly or pericardial effusion. Three-vessel coronary vascular calcification. Moderate atherosclerotic calcification of the thoracic aorta. No aneurysmal dilatation or dissection. Advanced atherosclerotic calcification of the origin of the left subclavian artery with high-grade luminal narrowing. The origins of the great vessels of the aortic arch and the central pulmonary arteries appear patent. Mediastinum/Nodes: Mildly enlarged right hilar lymph node measures 14 mm. The esophagus is grossly unremarkable. No mediastinal fluid collection. Lungs/Pleura: Background of emphysema. Patchy areas of consolidation in the lower lobes bilaterally, right greater than left consistent with pneumonia. Follow-up to resolution recommended. Diffuse interstitial thickening and ground-glass opacity throughout the upper lobes  new since the prior CT also consistent with pneumonia. There are small bilateral pleural effusions. No pneumothorax. The central airways are patent. Musculoskeletal: Minimally displaced fracture of the lateral right seventh and eighth ribs. Osteopenia with  degenerative changes of the spine. CT ABDOMEN PELVIS FINDINGS No intra-abdominal free air or free fluid. Hepatobiliary: The liver is unremarkable. No biliary dilatation. The gallbladder is unremarkable. Pancreas: Unremarkable. No pancreatic ductal dilatation or surrounding inflammatory changes. Spleen: Normal in size without focal abnormality. Adrenals/Urinary Tract: Bilateral adrenal thickening, left greater than right. There is no hydronephrosis on either side. There is symmetric enhancement and excretion of contrast by both kidneys. The visualized ureters and urinary bladder appear unremarkable. Stomach/Bowel: Mild sigmoid diverticulosis. There is no bowel obstruction or active inflammation. The appendix is normal. Vascular/Lymphatic: Advanced aortoiliac atherosclerotic disease. The IVC is unremarkable. No portal venous gas. There is no adenopathy. Reproductive: The uterus is grossly unremarkable. No suspicious adnexal masses. Other: None Musculoskeletal: Osteopenia with degenerative changes spine. No acute osseous pathology. IMPRESSION: 1. Multilobar pneumonia. Follow-up to resolution recommended. 2. Small bilateral pleural effusions. 3. Minimally displaced fracture of the lateral right seventh and eighth ribs. No pneumothorax. 4. No acute intra-abdominal or pelvic pathology. 5. Mild sigmoid diverticulosis. No bowel obstruction. Normal appendix. 6. Aortic Atherosclerosis (ICD10-I70.0) and Emphysema (ICD10-J43.9). Given the presence of pulmonary emphysema, an independent risk factor for lung cancer, consider evaluating the patient for a low-dose CT lung cancer screening program. Electronically Signed   By: Vanetta Chou M.D.   On: 11/12/24 10:30   CT HEAD WO CONTRAST ( ) Result Date: 2024/11/12 CLINICAL DATA:  Fall a few days ago. EXAM: CT HEAD WITHOUT CONTRAST CT CERVICAL SPINE WITHOUT CONTRAST TECHNIQUE: Multidetector CT imaging of the head and cervical spine was performed following the standard  protocol without intravenous contrast. Multiplanar CT image reconstructions of the cervical spine were also generated. RADIATION DOSE REDUCTION: This exam was performed according to the departmental dose-optimization program which includes automated exposure control, adjustment of the mA and/or kV according to patient size and/or use of iterative reconstruction technique. COMPARISON:  None Available. FINDINGS: CT HEAD FINDINGS There is mild motion artifact present. Brain: Ventricles, cisterns and other CSF spaces are normal. There is moderate chronic ischemic microvascular disease present. Due to the motion artifact, it would be difficult to exclude a small acute subarachnoid hemorrhage. There is no mass, mass effect or shift of midline structures. Subtle low-attenuation over the right frontal lobe seen only on the axial images as this may be due in part to the motion artifact and volume averaging, although acute to subacute ischemic change is possible. Vascular: No hyperdense vessel or unexpected calcification. Skull: Normal. Negative for fracture or focal lesion. Sinuses/Orbits: No acute finding. Other: None. CT CERVICAL SPINE FINDINGS Alignment: No posttraumatic subluxation. Skull base and vertebrae: Moderate to severe spondylosis of the cervical spine to include uncovertebral vertebral joint spurring and facet arthropathy. No evidence of acute fracture. Mild right-sided neural foraminal narrowing at the C2-3 level and bilateral neural foraminal narrowing at the C3-4 level right worse than left. Mild right-sided neural foraminal narrowing at the C4-5 level with moderate bilateral neural foraminal narrowing at the C5-6 level and C6-7 levels. Soft tissues and spinal canal: Prevertebral soft tissues are normal. Mild canal stenosis at the C5 level due to posterior spurring. Disc levels: Moderate disc space narrowing from the C3-4 level to the C6-7 level. Upper chest: Moderate bilateral airspace process over the upper  lungs which will be evaluated on patient's chest CT  performed today. Other: None. IMPRESSION: 1. No acute brain injury. Moderate chronic ischemic microvascular disease. 2. Subtle low-attenuation over the right frontal lobe seen only on the axial images as this may be due in part to the motion artifact and volume averaging, although acute to subacute ischemic change is possible. Consider MRI for better evaluation versus follow-up CT in 24-72 hours. 3. No acute cervical spine injury. Moderate to severe spondylosis of the cervical spine with multilevel disc disease and significant bilateral multilevel neural foraminal narrowing as described. 4. Moderate bilateral airspace process over the upper lungs which will be evaluated on patient's chest CT performed today. Electronically Signed   By: Toribio Agreste M.D.   On: 08-Nov-2024 10:20   CT Cervical Spine Wo Contrast Result Date: 2024/11/08 CLINICAL DATA:  Fall a few days ago. EXAM: CT HEAD WITHOUT CONTRAST CT CERVICAL SPINE WITHOUT CONTRAST TECHNIQUE: Multidetector CT imaging of the head and cervical spine was performed following the standard protocol without intravenous contrast. Multiplanar CT image reconstructions of the cervical spine were also generated. RADIATION DOSE REDUCTION: This exam was performed according to the departmental dose-optimization program which includes automated exposure control, adjustment of the mA and/or kV according to patient size and/or use of iterative reconstruction technique. COMPARISON:  None Available. FINDINGS: CT HEAD FINDINGS There is mild motion artifact present. Brain: Ventricles, cisterns and other CSF spaces are normal. There is moderate chronic ischemic microvascular disease present. Due to the motion artifact, it would be difficult to exclude a small acute subarachnoid hemorrhage. There is no mass, mass effect or shift of midline structures. Subtle low-attenuation over the right frontal lobe seen only on the axial images as  this may be due in part to the motion artifact and volume averaging, although acute to subacute ischemic change is possible. Vascular: No hyperdense vessel or unexpected calcification. Skull: Normal. Negative for fracture or focal lesion. Sinuses/Orbits: No acute finding. Other: None. CT CERVICAL SPINE FINDINGS Alignment: No posttraumatic subluxation. Skull base and vertebrae: Moderate to severe spondylosis of the cervical spine to include uncovertebral vertebral joint spurring and facet arthropathy. No evidence of acute fracture. Mild right-sided neural foraminal narrowing at the C2-3 level and bilateral neural foraminal narrowing at the C3-4 level right worse than left. Mild right-sided neural foraminal narrowing at the C4-5 level with moderate bilateral neural foraminal narrowing at the C5-6 level and C6-7 levels. Soft tissues and spinal canal: Prevertebral soft tissues are normal. Mild canal stenosis at the C5 level due to posterior spurring. Disc levels: Moderate disc space narrowing from the C3-4 level to the C6-7 level. Upper chest: Moderate bilateral airspace process over the upper lungs which will be evaluated on patient's chest CT performed today. Other: None. IMPRESSION: 1. No acute brain injury. Moderate chronic ischemic microvascular disease. 2. Subtle low-attenuation over the right frontal lobe seen only on the axial images as this may be due in part to the motion artifact and volume averaging, although acute to subacute ischemic change is possible. Consider MRI for better evaluation versus follow-up CT in 24-72 hours. 3. No acute cervical spine injury. Moderate to severe spondylosis of the cervical spine with multilevel disc disease and significant bilateral multilevel neural foraminal narrowing as described. 4. Moderate bilateral airspace process over the upper lungs which will be evaluated on patient's chest CT performed today. Electronically Signed   By: Toribio Agreste M.D.   On: 11-08-24 10:20    DG Ribs Unilateral W/Chest Right Result Date: 2024/11/08 CLINICAL DATA:  Fall and right chest  wall pain. EXAM: RIGHT RIBS AND CHEST - 3+ VIEW COMPARISON:  Chest radiograph dated 10/03/2023. FINDINGS: Minimally displaced fracture of the lateral right eighth rib. Diffuse bilateral interstitial densities as well as bibasilar airspace opacities consistent with multifocal pneumonia. No pleural effusion or pneumothorax. The cardiac silhouette is within limits. Atherosclerotic calcification of the aorta. IMPRESSION: 1. Minimally displaced fracture of the lateral right eighth rib. 2. Multifocal pneumonia. Electronically Signed   By: Vanetta Chou M.D.   On: 12-Nov-2024 08:16    Microbiology Recent Results (from the past 240 hours)  Resp panel by RT-PCR (RSV, Flu A&B, Covid) Anterior Nasal Swab     Status: None   Collection Time: Nov 12, 2024  8:10 AM   Specimen: Anterior Nasal Swab  Result Value Ref Range Status   SARS Coronavirus 2 by RT PCR NEGATIVE NEGATIVE Final    Comment: (NOTE) SARS-CoV-2 target nucleic acids are NOT DETECTED.  The SARS-CoV-2 RNA is generally detectable in upper respiratory specimens during the acute phase of infection. The lowest concentration of SARS-CoV-2 viral copies this assay can detect is 138 copies/mL. A negative result does not preclude SARS-Cov-2 infection and should not be used as the sole basis for treatment or other patient management decisions. A negative result may occur with  improper specimen collection/handling, submission of specimen other than nasopharyngeal swab, presence of viral mutation(s) within the areas targeted by this assay, and inadequate number of viral copies(<138 copies/mL). A negative result must be combined with clinical observations, patient history, and epidemiological information. The expected result is Negative.  Fact Sheet for Patients:  bloggercourse.com  Fact Sheet for Healthcare Providers:   seriousbroker.it  This test is no t yet approved or cleared by the [place]  FDA and  has been authorized for detection and/or diagnosis of SARS-CoV-2 by FDA under an Emergency Use Authorization (EUA). This EUA will remain  in effect (meaning this test can be used) for the duration of the COVID-19 declaration under Section 564(b)(1) of the Act, 21 U.S.C.section 360bbb-3(b)(1), unless the authorization is terminated  or revoked sooner.       Influenza A by PCR NEGATIVE NEGATIVE Final   Influenza B by PCR NEGATIVE NEGATIVE Final    Comment: (NOTE) The Xpert Xpress SARS-CoV-2/FLU/RSV plus assay is intended as an aid in the diagnosis of influenza from Nasopharyngeal swab specimens and should not be used as a sole basis for treatment. Nasal washings and aspirates are unacceptable for Xpert Xpress SARS-CoV-2/FLU/RSV testing.  Fact Sheet for Patients: bloggercourse.com  Fact Sheet for Healthcare Providers: seriousbroker.it  This test is not yet approved or cleared by the [place]  FDA and has been authorized for detection and/or diagnosis of SARS-CoV-2 by FDA under an Emergency Use Authorization (EUA). This EUA will remain in effect (meaning this test can be used) for the duration of the COVID-19 declaration under Section 564(b)(1) of the Act, 21 U.S.C. section 360bbb-3(b)(1), unless the authorization is terminated or revoked.     Resp Syncytial Virus by PCR NEGATIVE NEGATIVE Final    Comment: (NOTE) Fact Sheet for Patients: bloggercourse.com  Fact Sheet for Healthcare Providers: seriousbroker.it  This test is not yet approved or cleared by the [place]  FDA and has been authorized for detection and/or diagnosis of SARS-CoV-2 by FDA under an Emergency Use Authorization (EUA). This EUA will remain in effect (meaning this test can be used) for  the duration of the COVID-19 declaration under Section 564(b)(1) of the Act, 21 U.S.C. section 360bbb-3(b)(1), unless the authorization is terminated or  revoked.  Performed at [facility], [address] [place], [place] [address]     Lab Basic Metabolic Panel: Recent Labs  Lab 2024-11-17 0757 11-17-24 1300  NA 131*  --   K 3.9  --   CL 92*  --   CO2 11*  --   GLUCOSE 215*  --   BUN 16  --   CREATININE 0.91 0.74  CALCIUM  9.3  --    Liver Function Tests: Recent Labs  Lab 2024/11/17 0757  AST 35  ALT 16  ALKPHOS 153*  BILITOT 0.6  PROT 7.1  ALBUMIN  3.3*   No results for input(s): LIPASE, AMYLASE in the last 168 hours. No results for input(s): AMMONIA in the last 168 hours. CBC: Recent Labs  Lab 17-Nov-2024 0753 11-17-2024 1300  WBC 23.1* 18.4*  HGB 9.6* 8.9*  HCT 32.6* 28.2*  MCV 96.4 91.0  PLT 472* 435*   Cardiac Enzymes: No results for input(s): CKTOTAL, CKMB, CKMBINDEX, TROPONINI in the last 168 hours. Sepsis Labs: Recent Labs  Lab 2024-11-17 0753 17-Nov-2024 0832 2024-11-17 1300 November 17, 2024 1542  WBC 23.1*  --  18.4*  --   LATICACIDVEN  --  >9.0* 3.5* 1.7    Procedures/Operations  None  Lonell Moose, AGNP  Pulmonary/Critical Care Pager [phone] (please enter 7 digits) PCCM Consult Pager [phone] (please enter 7 digits)    "

## 2024-11-13 NOTE — Consult Note (Signed)
 CODE SEPSIS - PHARMACY COMMUNICATION  **Broad Spectrum Antibiotics should be administered within 1 hour of Sepsis diagnosis**  Time Code Sepsis Called/Page Received: 0831  Antibiotics Ordered: cefepime , vancomycin , azithromycin  Time of 1st antibiotic administration: 0843  Additional action taken by pharmacy: none needed  If necessary, Name of Provider/Nurse Contacted: none need    [name] [name], PharmD Pharmacy Resident  2024-11-18 8:35 AM

## 2024-11-13 NOTE — Sepsis Progress Note (Signed)
 eLink is following this Code Sepsis.

## 2024-11-13 NOTE — Progress Notes (Signed)
 PHARMACY CONSULT NOTE - FOLLOW UP  Pharmacy Consult for Electrolyte Monitoring and Replacement   Recent Labs: Potassium (mmol/L)  Date Value  2024-12-01 3.9   Calcium  (mg/dL)  Date Value  98/78/7973 9.3   Albumin  (g/dL)  Date Value  98/78/7973 3.3 (L)  02/24/2023 4.4   Sodium (mmol/L)  Date Value  Dec 01, 2024 131 (L)  04/19/2015 140     Assessment: 74 y.o. female w/ PMH of Tremor, HTN, carotid atherosclerosis, CAD, prediabetes, GAD on Plavix  who comes in with a fall. Pharmacy is asked to follow and replace electrolytes while in CCU  Goal of Therapy:  Electrolytes WNL  Plan:  --no electrolyte replacement warranted for today --recheck electrolytes in am  Adriana JONETTA Bolster ,PharmD Clinical Pharmacist 12/01/24 12:54 PM

## 2024-11-13 NOTE — ED Notes (Addendum)
 Time of Death 1752, confirmed by Chiquita, RN

## 2024-11-13 NOTE — ED Notes (Signed)
 Called CCMD to initiate cardiac monitoring.

## 2024-11-13 NOTE — Discharge Instructions (Signed)

## 2024-11-13 NOTE — TOC Initial Note (Signed)
 Transition of Care [facility]) - Initial/Assessment Note    Patient Details  Name: Wanda Clayton MRN: [id number] Date of Birth: November 10, 1950  Transition of Care [facility]) CM/SW Contact:    Ozie Dimaria L Malaysia Crance, LCSW Phone Number: 11/20/2024, 1:31 PM  Clinical Narrative:                    [facility] consult received for education/counseling which TOC does not provide. Resources added to the AVS.      Patient Goals and CMS Choice            Expected Discharge Plan and Services                                              Prior Living Arrangements/Services                       Activities of Daily Living      Permission Sought/Granted                  Emotional Assessment              Admission diagnosis:  Pneumonia [J18.9] Patient Active Problem List   Diagnosis Date Noted   Pneumonia 11/20/24   Dizziness 08/16/2024   Recurrent falls 08/08/2024   Laceration of skin of elbow, right, sequela 08/08/2024   Displaced fracture of distal phalanx of left thumb, initial encounter for closed fracture 08/05/2024   Abrasion of right upper arm 08/05/2024   Tachycardia 02/24/2024   Rash 11/23/2023   Lung nodule 10/22/2023   Wound dehiscence 10/22/2023   Atherosclerosis of artery of extremity with rest pain (HCC) 09/16/2023   B12 deficiency 09/01/2023   Atherosclerosis of native arteries of extremity with rest pain (HCC) 09/01/2023   Elevated MCV 05/27/2023   Chronic cerebral ischemia 05/10/2023   Discoloration of skin of lower leg 04/15/2023   Fatigue 04/01/2023   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   Hepatic steatosis 07/28/2022   Blood in stool    Polyp of ascending colon    Elevated liver enzymes 11/28/2020   Closed fracture of fifth metatarsal bone of left foot 02/08/2020   Carotid atherosclerosis, bilateral 01/11/2020   Trouble swallowing 11/30/2019   Family history of colon cancer 09/01/2018   Coronary artery calcification seen on CT scan 02/22/2018    Claudication in peripheral vascular disease 02/22/2018   Smoker 02/21/2018   Vertigo 01/06/2018   Atherosclerosis of aorta 11/26/2017   Hardening of the aorta (main artery of the heart) 11/26/2017   Osteopenia determined by x-ray 09/16/2017   Colitis, collagenous 05/22/2016   Chronic diarrhea 12/18/2015   Tremor 12/18/2015   Noninfective gastroenteritis and colitis 12/18/2015   Hearing loss 06/20/2015   Hyperlipidemia 06/20/2015   Routine general medical examination at a health care facility 06/01/2014   Ganglion cyst of wrist 02/02/2013   HTN (hypertension) 01/19/2012   Elevated blood-pressure reading without diagnosis of hypertension 01/19/2012   Atrophic vaginitis 12/11/2011   GAD (generalized anxiety disorder) 12/11/2011   Dysthymic disorder 12/11/2011   PCP:  Dineen Rollene MATSU, FNP Pharmacy:   [facility] [address] [place] [address] Phone: [phone] Fax: [phone]  [facility] [address] [place] - [address] [place] [address] Phone: [phone] Fax: [phone]  Social Drivers of Health (SDOH) Social History: SDOH Screenings   Food Insecurity: No Food Insecurity (03/25/2024)  Housing: Unknown (03/25/2024)  Transportation Needs: No Transportation Needs (03/25/2024)  Utilities: Not At Risk (03/25/2024)  Alcohol  Screen: Low Risk (03/25/2024)  Depression (PHQ2-9): Low Risk (08/16/2024)  Financial Resource Strain: Patient Declined (03/25/2024)  Physical Activity: Inactive (03/25/2024)  Social Connections: Moderately Integrated (03/25/2024)  Stress: No Stress Concern Present (03/25/2024)  Tobacco Use: High Risk (2024-11-21)  Health Literacy: Adequate Health Literacy (03/25/2024)   SDOH Interventions:     Readmission Risk Interventions     No data to display

## 2024-11-13 NOTE — ED Provider Notes (Signed)
 "  [facility] Provider Note    Event Date/Time   First MD Initiated Contact with Patient 12-01-2024 [phone]     (approximate)   History   Respiratory Distress   HPI  Wanda Clayton is a 74 y.o. female on Plavix  who comes in with a fall.  Patient fell about 6 days ago.  She told me she did not hit her head but according to family she did hit her head.  She reportedly hit her right chest wall.  Since the last couple of days she has had some increasing confusion and increasing shortness of breath when EMS got there she was in the 60s to 70s on room air.  Patient with significant work of breathing.  According to family she is recently being worked up for Parkinson's due to her tremor.  Patient also reports daily alcohol  drinking but unclear when she last drink.  She reported typically 2 to 3 cups of wine.  Physical Exam   Triage Vital Signs: ED Triage Vitals  Encounter Vitals Group     BP 12-01-2024 0756 (!) 160/123     Girls Systolic BP Percentile --      Girls Diastolic BP Percentile --      Boys Systolic BP Percentile --      Boys Diastolic BP Percentile --      Pulse Rate 12/01/24 0754 (!) 116     Resp 01-Dec-2024 0756 (!) 31     Temp --      Temp src --      SpO2 December 01, 2024 0751 (!) 72 %     Weight 12/01/24 0751 142 lb 6.7 oz (64.6 kg)     Height 12-01-24 0751 5' 3 (1.6 m)     Head Circumference --      Peak Flow --      Pain Score --      Pain Loc --      Pain Education --      Exclude from Growth Chart --     Most recent vital signs: Vitals:   12/01/2024 0830 Dec 01, 2024 0853  BP: (!) 148/129 113/89  Pulse: (!) 110 (!) 109  Resp: (!) 29 (!) 29  SpO2:  100%     General: Significant work of breathing, distress.  70% CV:  Good peripheral perfusion.  Resp:  Increased work of breathing, lung sounds noted bilaterally Abd:  No distention.  No obvious tenderness Other:  Moving extremities   ED Results / Procedures / Treatments   Labs (all labs ordered  are listed, but only abnormal results are displayed) Labs Reviewed  CBC - Abnormal; Notable for the following components:      Result Value   WBC 23.1 (*)    RBC 3.38 (*)    Hemoglobin 9.6 (*)    HCT 32.6 (*)    MCHC 29.4 (*)    RDW 17.0 (*)    Platelets 472 (*)    All other components within normal limits  PROTIME-INR - Abnormal; Notable for the following components:   Prothrombin Time 15.6 (*)    All other components within normal limits  COMPREHENSIVE METABOLIC PANEL WITH GFR - Abnormal; Notable for the following components:   Sodium 131 (*)    Chloride 92 (*)    CO2 11 (*)    Glucose, Bld 215 (*)    Albumin  3.3 (*)    Alkaline Phosphatase 153 (*)    Anion gap 28 (*)    All  other components within normal limits  PRO BRAIN NATRIURETIC PEPTIDE - Abnormal; Notable for the following components:   Pro Brain Natriuretic Peptide 3,356.0 (*)    All other components within normal limits  BLOOD GAS, VENOUS - Abnormal; Notable for the following components:   pH, Ven 7.14 (*)    pCO2, Ven 33 (*)    Bicarbonate 11.2 (*)    Acid-base deficit 16.7 (*)    All other components within normal limits  RESP PANEL BY RT-PCR (RSV, FLU A&B, COVID)  RVPGX2  CULTURE, BLOOD (ROUTINE X 2)  CULTURE, BLOOD (ROUTINE X 2)  ETHANOL  LACTIC ACID, PLASMA  TROPONIN T, HIGH SENSITIVITY  TROPONIN T, HIGH SENSITIVITY     EKG  My interpretation of EKG:  A lot of artifact noted with a heart rate of 115 without any obvious ST elevation or T wave inversions but will get a repeat.  Repeat looks like sinus with a rate of 109 without any ST elevation, QTc of 500,  RADIOLOGY I have reviewed the xray personally and interpreted bilateral pneumonia   PROCEDURES:  Critical Care performed: Yes, see critical care procedure note(s)  .1-3 Lead EKG Interpretation  Performed by: Ernest Ronal BRAVO, MD Authorized by: Ernest Ronal BRAVO, MD     Interpretation: abnormal     ECG rate:  100   ECG rate assessment:  tachycardic     Rhythm: sinus tachycardia     Ectopy: none     Conduction: normal   .Critical Care  Performed by: Ernest Ronal BRAVO, MD Authorized by: Ernest Ronal BRAVO, MD   Critical care provider statement:    Critical care time (minutes):  30   Critical care was necessary to treat or prevent imminent or life-threatening deterioration of the following conditions:  Respiratory failure   Critical care was time spent personally by me on the following activities:  Development of treatment plan with patient or surrogate, discussions with consultants, evaluation of patient's response to treatment, examination of patient, ordering and review of laboratory studies, ordering and review of radiographic studies, ordering and performing treatments and interventions, pulse oximetry, re-evaluation of patient's condition and review of old charts    MEDICATIONS ORDERED IN ED: Medications  lidocaine  (LIDODERM ) 5 % 1 patch (1 patch Transdermal Patch Applied November 05, 2024 0803)  doxycycline  (VIBRAMYCIN ) 100 mg in sodium chloride  0.9 % 250 mL IVPB (has no administration in time range)  LORazepam  (ATIVAN ) injection 0.5 mg (0.5 mg Intravenous Given Nov 05, 2024 0804)  vancomycin  (VANCOCIN ) IVPB 1000 mg/200 mL premix (1,000 mg Intravenous New Bag/Given 11/05/2024 0853)  ceFEPIme  (MAXIPIME ) 2 g in sodium chloride  0.9 % 100 mL IVPB (0 g Intravenous Stopped 2024/11/05 0913)  sodium chloride  0.9 % bolus 1,000 mL (0 mLs Intravenous Stopped 11-05-2024 0852)  lactated ringers  bolus 1,000 mL (1,000 mLs Intravenous New Bag/Given 11-05-24 0854)  iohexol  (OMNIPAQUE ) 300 MG/ML solution 100 mL (100 mLs Intravenous Contrast Given 05-Nov-2024 0937)  LORazepam  (ATIVAN ) injection 0.5 mg (0.5 mg Intravenous Given 11/05/2024 0929)     IMPRESSION / MDM / ASSESSMENT AND PLAN / ED COURSE  I reviewed the triage vital signs and the nursing notes.   Patient's presentation is most consistent with acute presentation with potential threat to life or bodily function.    Patient comes in with concerns for shortness of breath satting in the 70s placed on BiPAP given significant work of breathing.  Breath sounds bilaterally seems less likely pneumothorax.  Portable chest x-ray concerning for multifocal pneumonia.  Patient was slightly  hypotensive and full fluid resuscitation was ordered.  Patient was started on broad-spectrum antibiotics.  After discussion with family they do report that she hit her head and she has a drop in her hemoglobin therefore we will get CT pan scan to ensure no other evidence of trauma.  CMP does show a low bicarb of 11, her glucose is in the 200s but she has no history of diabetes.  Her VBG did show acidosis which I suspect is from an elevated lactate and patient is getting fluid resuscitation.  CBC shows elevated white count troponin was negative.  1. No acute brain injury. Moderate chronic ischemic microvascular  disease.  2. Subtle low-attenuation over the right frontal lobe seen only on  the axial images as this may be due in part to the motion artifact  and volume averaging, although acute to subacute ischemic change is  possible. Consider MRI for better evaluation versus follow-up CT in  24-72 hours.  3. No acute cervical spine injury. Moderate to severe spondylosis of  the cervical spine with multilevel disc disease and significant  bilateral multilevel neural foraminal narrowing as described.   IMPRESSION: 1. Multilobar pneumonia. Follow-up to resolution recommended. 2. Small bilateral pleural effusions. 3. Minimally displaced fracture of the lateral right seventh and eighth ribs. No pneumothorax. 4. No acute intra-abdominal or pelvic pathology. 5. Mild sigmoid diverticulosis. No bowel obstruction. Normal appendix. 6. Aortic Atherosclerosis (ICD10-I70.0) and Emphysema (ICD10-J43.9). Given the presence of pulmonary emphysema, an independent risk factor for lung cancer, consider evaluating the patient for a low-dose CT  lung cancer screening program.  Patient's lactate is greater than 9.  Discussed incidental finding with the family.  Patient blood pressures with maps at 65 but low systolic pressures in the 70s to 80s given the significantly elevated lactate will start on Levophed .  Will discuss with the ICU team for admission.  The patient is on the cardiac monitor to evaluate for evidence of arrhythmia and/or significant heart rate changes.      FINAL CLINICAL IMPRESSION(S) / ED DIAGNOSES   Final diagnoses:  Acute respiratory failure with hypoxia (HCC)  Closed fracture of multiple ribs of right side, initial encounter  Septic shock (HCC)     Rx / DC Orders   ED Discharge Orders     None        Note:  This document was prepared using Dragon voice recognition software and may include unintentional dictation errors.   Ernest Ronal BRAVO, MD 08-Nov-2024 1109  "

## 2024-11-13 NOTE — ED Triage Notes (Signed)
 Pt arrives via EMS from home with reports of resp distress. Family reports a fall a few days ago and worried about broken ribs. Fire reports O2 60% RA. Pt arrives on duoneb, O2 62%. Family reports pt smokes daily. Pt reports pain all over.

## 2024-11-13 NOTE — H&P (Signed)
 "  NAME:  Wanda Clayton, MRN:  [id number], DOB:  [birth year], LOS: 0 ADMISSION DATE:  2024-11-05, CONSULTATION DATE: 11-05-24 REFERRING MD: Dr. Ernest, CHIEF COMPLAINT: Fall   History of Present Illness:  This is a 74 yo female who presented to [facility] ER on Nov 05, 2024 following a fall 6 days prior to presentation.  She reported she did not hit her head during the fall, however her family reports she did.  She does endorse hitting her right chest wall.  Over the past two days the pt has had increasing confusion and shortness of breath prompting EMS notification.  When EMS arrived on the scene pt hypoxic with O2 sats 60 to 70% on RA.    ED Course  Upon arrival to the ER pt noted to have increased work of breathing and appeared tremulous.  Pt placed on Bipap.  However, pts family reports pt has tremors at baseline and is being worked up for [employer].  They also report the pt drinks 2-3 cups of wine daily, pt reports she last drank alcohol  yesterday around 1400.  She also smokes cigarettes daily.  Significant lab results were: Na+ 131/chloride 92/CO2 11/glucose 215/anion gap 28/alk phos 153/albumin  3.3/pro BNP 3,356/lactic acid >9.0/wbc 23.1/hgb 9.6/platelet 472.  VBG: pH 7.14/CO2 33/acid-base deficit 16.7/bicarb 11.2.  X-ray revealed minimally displaced fracture of the lateral right eighth rib and multifocal pneumonia.  CT Chest concerning for multilobar pneumonia and small bilateral pleural effusions.  Pt met sepsis protocol and received: 2L of iv fluid resuscitation/cefepime /vancomycin / doxycycline .  Despite iv fluid resuscitation pt remained hypotensive requiring levophed  gtt.  PCCM team contacted for ICU admission.    CT Chest/Abd/Pelvis: Multilobar pneumonia. Follow-up to resolution recommended. Small bilateral pleural effusions. Minimally displaced fracture of the lateral right seventh and eighth ribs. No pneumothorax. No acute intra-abdominal or pelvic pathology. Mild sigmoid diverticulosis. No bowel  obstruction. Normal appendix. Aortic Atherosclerosis (ICD10-I70.0) and Emphysema (ICD10-J43.9). Given the presence of pulmonary emphysema, an independent risk factor for lung cancer, consider evaluating the patient for a low-dose CT lung cancer screening program.   Pertinent  Medical History  Anxiety  CAD Depression  Elevated Liver Enzymes  Hypercholesteremia  HLD HTN Current Smoker  Recurrent Falls Tremors  Noninfective Gastroenteritis   Micro Data:  11/05/24: COVID/Influenza A&B/RSV>>negative  11-05-2024: Blood x2>> 11/05/24: MRSA PCR>> 2024-11-05: Strep pneum ur ag>> Nov 05, 2024: Legionella pneumophila ur ag>>  Anti-infectives (From admission, onward)    Start     Dose/Rate Route Frequency Ordered Stop   November 05, 2024 2200  doxycycline  (VIBRAMYCIN ) 100 mg in sodium chloride  0.9 % 250 mL IVPB        100 mg 125 mL/hr over 120 Minutes Intravenous Every 12 hours 2024-11-05 1323     05-Nov-2024 1500  cefTRIAXone  (ROCEPHIN ) 2 g in sodium chloride  0.9 % 100 mL IVPB        2 g 200 mL/hr over 30 Minutes Intravenous Every 24 hours 11-05-24 1323     11/05/2024 0845  vancomycin  (VANCOCIN ) IVPB 1000 mg/200 mL premix        1,000 mg 200 mL/hr over 60 Minutes Intravenous  Once 11-05-2024 0831 November 05, 2024 0953   2024/11/05 0845  ceFEPIme  (MAXIPIME ) 2 g in sodium chloride  0.9 % 100 mL IVPB        2 g 200 mL/hr over 30 Minutes Intravenous  Once 05-Nov-2024 0831 11/05/24 0913   2024/11/05 0845  azithromycin (ZITHROMAX) 500 mg in sodium chloride  0.9 % 250 mL IVPB  Status:  Discontinued  500 mg 250 mL/hr over 60 Minutes Intravenous  Once 12/02/24 0831 Dec 02, 2024 0839   2024-12-02 0845  doxycycline  (VIBRAMYCIN ) 100 mg in sodium chloride  0.9 % 250 mL IVPB        100 mg 125 mL/hr over 120 Minutes Intravenous  Once 12/02/24 0839 2024/12/02 1213      Significant Hospital Events: Including procedures, antibiotic start and stop dates in addition to other pertinent events   2024/12/02: Admitted with septic shock and acute hypoxic respiratory  failure secondary to pneumonia requiring Bipap and levophed  gtt   Interim History / Subjective:  Pt remains on Bipap FiO2 75% and states at times she feels like she can't breath.  She has severe anxiety and chronic tremors.  She is currently requiring levophed  gtt 10 mcg/min to maintain map >65  Objective    Blood pressure 91/76, pulse (!) 109, temperature 97.7 F (36.5 C), temperature source Axillary, resp. rate (!) 26, height 5' 3 (1.6 m), weight 64.6 kg, SpO2 92%.    FiO2 (%):  [80 %] 80 % PEEP:  [5 cmH20] 5 cmH20 Pressure Support:  [5 cmH20] 5 cmH20   Intake/Output Summary (Last 24 hours) at 02-Dec-2024 1246 Last data filed at 02-Dec-2024 1050 Gross per 24 hour  Intake 2000 ml  Output --  Net 2000 ml   Filed Weights   12/02/24 0751  Weight: 64.6 kg    Examination: General: Acute on chronically-ill female, in respiratory distress on Bipap  HENT: Supple, mild JVD present  Lungs: Rhonchi throughout, tachypneic with accessory muscle use  Cardiovascular: Sinus tachycardia, s1s2, no m/r/g, 2+ radial/1+ distal pulses, no edema  Abdomen: +BS x4, obese, soft, non distended, non tender  Extremities: Normal bulk and tone, moves all extremities  Neuro: Awake and oriented x4, follows commands, PERRLA  GU: Deferred   Resolved problem list   Assessment and Plan   #Chronic anxiety  #Acute pain secondary rib fracture  #ETOH abuse  #Chronic tremors  CT Head Cervical Spine 12-02-24: No acute brain injury. Moderate chronic ischemic microvascular disease. Subtle low-attenuation over the right frontal lobe seen only on the axial images as this may be due in part to the motion artifact and volume averaging, although acute to subacute ischemic change is possible. Consider MRI for better evaluation versus follow-up CT in 24-72 hours. No acute cervical spine injury. Moderate to severe spondylosis of the cervical spine with multilevel disc disease and significant bilateral multilevel neural foraminal  narrowing as described. Moderate bilateral airspace process over the upper lungs which will be evaluated on patient's chest CT performed today. - Provide supportive care  - Prn precedex  gtt for anxiety  - CIWA q4hrs  - Thiamine , folic acid , and mvi   #Septic shock  #Severe lactic acidosis  Hx: Hypercholesteremia, PVD, HTN, and CAD  - Continuous telemetry monitoring  - Hold outpatient antihypertensives  - Trend lactic acid until normalized  - Echo pending  - Will resume aspirin , atorvastatin , and clopidogrel  once able to tolerate po's  - IV fluid resuscitation and/or prn levophed  gtt to maintain map >65  #Acute hypoxic respiratory failure  #Multifocal pneumonia  #Pulmonary emphysema  #Small bilateral pleural effusions  #Current everyday smoker  - Bipap or supplemental O2 for dyspnea and/or hypoxia  - Maintain O2 sats 88% to 92% - Intermittent CXR's and ABG's  - Aggressive pulmonary hygiene as able  - Smoking cessation counseling provided - HIGH RISK FOR INTUBATION   #Severe anion gap metabolic acidosis  - Trend BMP and vbg  -  Replace electrolytes as indicated  - Strict I&O's   #Sepsis  - Trend WBC and monitor fever curve  - Follow cultures  - Continue abx as outlined above pending culture results and sensitivities   #Anemia without obvious signs of bleeding  - Trend CBC  - Monitor for s/sx of bleeding  - Transfuse for hgb <7 - Lovenox  for VTE px   #Hyperglycemia  - Hemoglobin A1c pending  - CBG's q4hrs  - SSI - Target CBG readings 140 to 180 - Follow hypo/hyperglycemic protocol   Labs   CBC: Recent Labs  Lab 11-30-24 0753  WBC 23.1*  HGB 9.6*  HCT 32.6*  MCV 96.4  PLT 472*    Basic Metabolic Panel: Recent Labs  Lab November 30, 2024 0757  NA 131*  K 3.9  CL 92*  CO2 11*  GLUCOSE 215*  BUN 16  CREATININE 0.91  CALCIUM  9.3   GFR: Estimated Creatinine Clearance: 49.8 mL/min (by C-G formula based on SCr of 0.91 mg/dL). Recent Labs  Lab November 30, 2024 0753  Nov 30, 2024 0832  WBC 23.1*  --   LATICACIDVEN  --  >9.0*    Liver Function Tests: Recent Labs  Lab 11/30/2024 0757  AST 35  ALT 16  ALKPHOS 153*  BILITOT 0.6  PROT 7.1  ALBUMIN  3.3*   No results for input(s): LIPASE, AMYLASE in the last 168 hours. No results for input(s): AMMONIA in the last 168 hours.  ABG    Component Value Date/Time   PHART 7.45 11/30/24 1232   PCO2ART 35 11/30/24 1232   PO2ART 60 (L) 2024/11/30 1232   HCO3 24.3 30-Nov-2024 1232   ACIDBASEDEF 16.7 (H) 11/30/2024 0757   O2SAT 90.6 11/30/24 1232     Coagulation Profile: Recent Labs  Lab 11-30-24 0753  INR 1.2    Cardiac Enzymes: No results for input(s): CKTOTAL, CKMB, CKMBINDEX, TROPONINI in the last 168 hours.  HbA1C: Hgb A1c MFr Bld  Date/Time Value Ref Range Status  01/08/2022 09:07 AM 5.3 4.6 - 6.5 % Final    Comment:    Glycemic Control Guidelines for People with Diabetes:Non Diabetic:  <6%Goal of Therapy: <7%Additional Action Suggested:  >8%   10/15/2020 10:55 AM 5.3 4.6 - 6.5 % Final    Comment:    Glycemic Control Guidelines for People with Diabetes:Non Diabetic:  <6%Goal of Therapy: <7%Additional Action Suggested:  >8%     CBG: No results for input(s): GLUCAP in the last 168 hours.  Review of Systems: Positives in BOLD   Gen: anxiety, fall, fever, chills, weight change, fatigue, night sweats HEENT: Denies blurred vision, double vision, hearing loss, tinnitus, sinus congestion, rhinorrhea, sore throat, neck stiffness, dysphagia PULM: shortness of breath, cough, sputum production, hemoptysis, wheezing CV: Denies chest pain, edema, orthopnea, paroxysmal nocturnal dyspnea, palpitations GI: Denies abdominal pain, nausea, vomiting, diarrhea, hematochezia, melena, constipation, change in bowel habits GU: Denies dysuria, hematuria, polyuria, oliguria, urethral discharge Endocrine: Denies hot or cold intolerance, polyuria, polyphagia or appetite change Derm: Denies  rash, dry skin, scaling or peeling skin change Heme: Denies easy bruising, bleeding, bleeding gums Neuro: Denies headache, numbness, weakness, slurred speech, loss of memory or consciousness  Past Medical History:  She,  has a past medical history of Acute pain of left foot (02/08/2020), Anxiety, Coronary artery disease, Depression, Dyspnea, Elevated liver enzymes (11/28/2020), High cholesterol, Hypertension, Peripheral vascular disease, and Smoker (02/21/2018).   Surgical History:   Past Surgical History:  Procedure Laterality Date   BUNIONECTOMY Bilateral    carpal tunnel repair  COLONOSCOPY WITH PROPOFOL  N/A 08/19/2021   Procedure: COLONOSCOPY WITH PROPOFOL ;  Surgeon: Unk Corinn Skiff, MD;  Location: [facility] ENDOSCOPY;  Service: Gastroenterology;  Laterality: N/A;   DENTAL SURGERY     ENDARTERECTOMY FEMORAL Bilateral 09/16/2023   Procedure: ENDARTERECTOMY FEMORAL (BILATERAL SFA STENTS);  Surgeon: Jama Cordella MATSU, MD;  Location: [facility] ORS;  Service: Vascular;  Laterality: Bilateral;   FOOT SURGERY Right    LOWER EXTREMITY ANGIOGRAPHY Right 08/18/2023   Procedure: Lower Extremity Angiography;  Surgeon: Jama Cordella MATSU, MD;  Location: [facility] INVASIVE CV LAB;  Service: Cardiovascular;  Laterality: Right;   TUBAL LIGATION     VAGINAL DELIVERY     x1     Social History:   reports that she has been smoking cigarettes. She has a 18 pack-year smoking history. She has never used smokeless tobacco. She reports current alcohol  use of about 14.0 standard drinks of alcohol  per week. She reports that she does not use drugs.   Family History:  Her family history includes Atrial fibrillation in her mother; Breast cancer in her sister; COPD in her sister; Colon cancer (age of onset: 34) in her sister; Colon cancer (age of onset: 14) in her father; Colon cancer (age of onset: 2) in her mother; Congestive Heart Failure in her father; Crohn's disease in her sister; Dementia (age of onset: 53) in her  mother; Diabetes in her father; Heart attack in her sister; Heart disease in her brother and father; Hypertension in her daughter and mother; Parkinson's disease (age of onset: 33) in her maternal grandfather.   Allergies Allergies[1]   Home Medications  Prior to Admission medications  Medication Sig Start Date End Date Taking? Authorizing Provider  acetaminophen  (TYLENOL ) 325 MG tablet Take 1-2 tablets (325-650 mg total) by mouth every 4 (four) hours as needed for mild pain (pain score 1-3) (or temp >/= 101 F). 09/19/23   Clarice Martin, MD  amLODipine  (NORVASC ) 2.5 MG tablet Take 1 tablet (2.5 mg total) by mouth daily. 02/24/24   Dineen Rollene MATSU, FNP  amLODipine  (NORVASC ) 5 MG tablet Take 1 tablet (5 mg total) by mouth daily as needed. 08/27/23   Dineen Rollene MATSU, FNP  aspirin  81 MG tablet Take 81 mg by mouth daily.    [provider]  atorvastatin  (LIPITOR) 40 MG tablet Take 1 tablet (40 mg total) by mouth daily. 04/06/24     CALCIUM  PO Take 500 mg by mouth daily.    [provider]  Cholecalciferol (VITAMIN D3 PO) Take 25 mcg by mouth daily.    [provider]  clopidogrel  (PLAVIX ) 75 MG tablet Take 1 tablet (75 mg total) by mouth daily at 6 (six) AM. 09/27/24   Arnett, Rollene MATSU, FNP  Cyanocobalamin  (B-12 PO) Take 500 mcg by mouth daily.    [provider]  ezetimibe  (ZETIA ) 10 MG tablet Take 1 tablet (10 mg total) by mouth daily. 06/20/24   Dineen Rollene MATSU, FNP  FLUoxetine  (PROZAC ) 40 MG capsule Take 1 capsule (40 mg total) by mouth every morning. 04/06/24   Dineen Rollene MATSU, FNP  meclizine  (ANTIVERT ) 12.5 MG tablet Take 1 tablet (12.5 mg total) by mouth 3 (three) times daily as needed (vertigo). Patient not taking: Reported on 10/17/2024 04/15/23   Dineen Rollene MATSU, FNP  Multiple Vitamins-Minerals (EQ MULTIVITAMINS ADULT GUMMY PO) Take 2 tablets by mouth daily.    [provider]    Informed pt and pts daughter at bedside pt is HIGH RISK  FOR INTUBATION and/or  CARDIAC ARREST due to pneumonia.  Discussed code status and goals of treatment.  Pt states she would want CPR/ACLS Medications/Mechanical Intubation if necessary.  However, the pt states she would NEVER WANT A TRACHEOSTOMY OR PEG TUBE   Critical care time: 65 minutes      Lonell Moose, AGNP  Pulmonary/Critical Care Pager [phone] (please enter 7 digits) PCCM Consult Pager [phone] (please enter 7 digits)          [1]  Allergies Allergen Reactions   Cat Dander    Pollen Extract Itching   "

## 2024-11-13 DEATH — deceased

## 2024-12-12 ENCOUNTER — Other Ambulatory Visit

## 2024-12-15 ENCOUNTER — Ambulatory Visit: Admitting: Family

## 2024-12-19 ENCOUNTER — Other Ambulatory Visit

## 2024-12-19 ENCOUNTER — Encounter

## 2025-03-16 ENCOUNTER — Encounter (INDEPENDENT_AMBULATORY_CARE_PROVIDER_SITE_OTHER)

## 2025-03-16 ENCOUNTER — Ambulatory Visit (INDEPENDENT_AMBULATORY_CARE_PROVIDER_SITE_OTHER): Admitting: Nurse Practitioner

## 2025-03-27 ENCOUNTER — Ambulatory Visit
